# Patient Record
Sex: Female | Born: 1975 | Race: White | Hispanic: No | Marital: Single | Smoking: Current some day smoker
Health system: Southern US, Community
[De-identification: ages and names within clinical notes are randomized; demographics above are authoritative.]

## PROBLEM LIST (undated history)

## (undated) DIAGNOSIS — E785 Hyperlipidemia, unspecified: Secondary | ICD-10-CM

## (undated) DIAGNOSIS — F419 Anxiety disorder, unspecified: Secondary | ICD-10-CM

## (undated) DIAGNOSIS — A6000 Herpesviral infection of urogenital system, unspecified: Secondary | ICD-10-CM

## (undated) DIAGNOSIS — R945 Abnormal results of liver function studies: Secondary | ICD-10-CM

## (undated) DIAGNOSIS — J309 Allergic rhinitis, unspecified: Secondary | ICD-10-CM

## (undated) DIAGNOSIS — F32A Depression, unspecified: Secondary | ICD-10-CM

## (undated) DIAGNOSIS — K219 Gastro-esophageal reflux disease without esophagitis: Secondary | ICD-10-CM

## (undated) DIAGNOSIS — F329 Major depressive disorder, single episode, unspecified: Secondary | ICD-10-CM

## (undated) DIAGNOSIS — E559 Vitamin D deficiency, unspecified: Secondary | ICD-10-CM

## (undated) DIAGNOSIS — R7989 Other specified abnormal findings of blood chemistry: Secondary | ICD-10-CM

## (undated) DIAGNOSIS — E119 Type 2 diabetes mellitus without complications: Secondary | ICD-10-CM

## (undated) HISTORY — DX: Abnormal results of liver function studies: R94.5

## (undated) HISTORY — PX: APPENDECTOMY: SHX54

## (undated) HISTORY — DX: Herpesviral infection of urogenital system, unspecified: A60.00

## (undated) HISTORY — DX: Allergic rhinitis, unspecified: J30.9

## (undated) HISTORY — DX: Depression, unspecified: F32.A

## (undated) HISTORY — DX: Hyperlipidemia, unspecified: E78.5

## (undated) HISTORY — DX: Major depressive disorder, single episode, unspecified: F32.9

## (undated) HISTORY — DX: Anxiety disorder, unspecified: F41.9

## (undated) HISTORY — DX: Type 2 diabetes mellitus without complications: E11.9

## (undated) HISTORY — DX: Vitamin D deficiency, unspecified: E55.9

## (undated) HISTORY — PX: COLONOSCOPY: SHX174

## (undated) HISTORY — DX: Other specified abnormal findings of blood chemistry: R79.89

## (undated) HISTORY — PX: TUBAL LIGATION: SHX77

---

## 2005-06-13 ENCOUNTER — Ambulatory Visit (HOSPITAL_COMMUNITY): Admission: RE | Admit: 2005-06-13 | Discharge: 2005-06-13 | Payer: Self-pay | Admitting: Gynecology

## 2005-08-21 ENCOUNTER — Ambulatory Visit: Payer: Self-pay | Admitting: Family Medicine

## 2005-08-23 ENCOUNTER — Ambulatory Visit (HOSPITAL_COMMUNITY): Admission: RE | Admit: 2005-08-23 | Discharge: 2005-08-23 | Payer: Self-pay | Admitting: Family Medicine

## 2005-08-28 ENCOUNTER — Ambulatory Visit: Payer: Self-pay | Admitting: Family Medicine

## 2005-09-04 ENCOUNTER — Ambulatory Visit: Payer: Self-pay | Admitting: Family Medicine

## 2005-09-18 ENCOUNTER — Ambulatory Visit: Payer: Self-pay | Admitting: Family Medicine

## 2005-09-25 ENCOUNTER — Ambulatory Visit: Payer: Self-pay | Admitting: Family Medicine

## 2005-09-28 ENCOUNTER — Ambulatory Visit: Payer: Self-pay | Admitting: *Deleted

## 2005-10-02 ENCOUNTER — Ambulatory Visit: Payer: Self-pay | Admitting: Gynecology

## 2005-10-05 ENCOUNTER — Ambulatory Visit: Payer: Self-pay | Admitting: *Deleted

## 2005-10-09 ENCOUNTER — Ambulatory Visit: Payer: Self-pay | Admitting: Family Medicine

## 2005-10-12 ENCOUNTER — Ambulatory Visit: Payer: Self-pay | Admitting: Obstetrics & Gynecology

## 2005-10-16 ENCOUNTER — Ambulatory Visit: Payer: Self-pay | Admitting: Family Medicine

## 2005-10-19 ENCOUNTER — Ambulatory Visit: Payer: Self-pay | Admitting: *Deleted

## 2005-10-23 ENCOUNTER — Ambulatory Visit: Payer: Self-pay | Admitting: Family Medicine

## 2005-10-26 ENCOUNTER — Ambulatory Visit: Payer: Self-pay | Admitting: *Deleted

## 2005-10-30 ENCOUNTER — Ambulatory Visit: Payer: Self-pay | Admitting: Family Medicine

## 2005-10-31 ENCOUNTER — Inpatient Hospital Stay (HOSPITAL_COMMUNITY): Admission: AD | Admit: 2005-10-31 | Discharge: 2005-11-03 | Payer: Self-pay | Admitting: Gynecology

## 2005-10-31 ENCOUNTER — Ambulatory Visit: Payer: Self-pay | Admitting: Gynecology

## 2005-11-05 ENCOUNTER — Ambulatory Visit: Payer: Self-pay | Admitting: Gynecology

## 2005-11-07 ENCOUNTER — Ambulatory Visit: Payer: Self-pay | Admitting: Gynecology

## 2005-11-13 ENCOUNTER — Ambulatory Visit: Payer: Self-pay | Admitting: Obstetrics & Gynecology

## 2005-11-13 ENCOUNTER — Ambulatory Visit: Payer: Self-pay | Admitting: Family Medicine

## 2005-11-20 ENCOUNTER — Ambulatory Visit: Payer: Self-pay | Admitting: Obstetrics & Gynecology

## 2005-11-23 ENCOUNTER — Inpatient Hospital Stay (HOSPITAL_COMMUNITY): Admission: AD | Admit: 2005-11-23 | Discharge: 2005-11-23 | Payer: Self-pay | Admitting: Obstetrics & Gynecology

## 2005-11-27 ENCOUNTER — Ambulatory Visit: Payer: Self-pay | Admitting: Family Medicine

## 2005-11-29 ENCOUNTER — Ambulatory Visit: Payer: Self-pay | Admitting: Obstetrics & Gynecology

## 2005-12-04 ENCOUNTER — Ambulatory Visit: Payer: Self-pay | Admitting: Family Medicine

## 2005-12-25 ENCOUNTER — Ambulatory Visit: Payer: Self-pay | Admitting: Family Medicine

## 2010-07-10 ENCOUNTER — Ambulatory Visit: Payer: Self-pay | Admitting: Otolaryngology

## 2010-07-27 LAB — HM PAP SMEAR: HM Pap smear: NORMAL

## 2011-07-02 ENCOUNTER — Ambulatory Visit: Payer: Self-pay | Admitting: Family Medicine

## 2011-09-18 ENCOUNTER — Ambulatory Visit: Payer: Self-pay | Admitting: Surgery

## 2011-09-18 LAB — CBC WITH DIFFERENTIAL/PLATELET
Basophil #: 0.1 10*3/uL (ref 0.0–0.1)
Basophil %: 2.5 %
Eosinophil #: 0.1 10*3/uL (ref 0.0–0.7)
Eosinophil %: 1.5 %
HCT: 41.6 % (ref 35.0–47.0)
HGB: 13.4 g/dL (ref 12.0–16.0)
Lymphocyte #: 1.6 10*3/uL (ref 1.0–3.6)
Lymphocyte %: 32.7 %
MCH: 30.3 pg (ref 26.0–34.0)
MCHC: 32.3 g/dL (ref 32.0–36.0)
MCV: 94 fL (ref 80–100)
Monocyte #: 0.3 10*3/uL (ref 0.0–0.7)
Monocyte %: 6.3 %
Neutrophil #: 2.8 10*3/uL (ref 1.4–6.5)
Neutrophil %: 57 %
Platelet: 234 10*3/uL (ref 150–440)
RBC: 4.44 10*6/uL (ref 3.80–5.20)
RDW: 12.6 % (ref 11.5–14.5)
WBC: 4.9 10*3/uL (ref 3.6–11.0)

## 2011-09-18 LAB — BASIC METABOLIC PANEL
Anion Gap: 9 (ref 7–16)
BUN: 12 mg/dL (ref 7–18)
Calcium, Total: 8.9 mg/dL (ref 8.5–10.1)
Chloride: 105 mmol/L (ref 98–107)
Co2: 27 mmol/L (ref 21–32)
Creatinine: 0.79 mg/dL (ref 0.60–1.30)
EGFR (African American): >60
EGFR (Non-African Amer.): >60
Glucose: 115 mg/dL — ABNORMAL HIGH (ref 65–99)
Osmolality: 282 (ref 275–301)
Potassium: 3.8 mmol/L (ref 3.5–5.1)
Sodium: 141 mmol/L (ref 136–145)

## 2011-09-25 ENCOUNTER — Inpatient Hospital Stay: Payer: Self-pay | Admitting: Surgery

## 2011-09-25 HISTORY — PX: HERNIA REPAIR: SHX51

## 2011-09-25 LAB — CREATININE, SERUM
Creatinine: 0.78 mg/dL (ref 0.60–1.30)
EGFR (African American): >60
EGFR (Non-African Amer.): >60

## 2011-09-25 LAB — PREGNANCY, URINE: Pregnancy Test, Urine: NEGATIVE m[IU]/mL

## 2011-09-26 LAB — CBC WITH DIFFERENTIAL/PLATELET
Basophil #: 0 10*3/uL (ref 0.0–0.1)
Basophil %: 0.4 %
Eosinophil #: 0.1 10*3/uL (ref 0.0–0.7)
Eosinophil %: 0.9 %
HCT: 39.4 % (ref 35.0–47.0)
HGB: 13.1 g/dL (ref 12.0–16.0)
Lymphocyte #: 0.8 10*3/uL — ABNORMAL LOW (ref 1.0–3.6)
Lymphocyte %: 11.8 %
MCH: 31.4 pg (ref 26.0–34.0)
MCHC: 33.1 g/dL (ref 32.0–36.0)
MCV: 95 fL (ref 80–100)
Monocyte #: 0.5 x10 3/mm (ref 0.2–0.9)
Monocyte %: 7.8 %
Neutrophil #: 5.4 10*3/uL (ref 1.4–6.5)
Neutrophil %: 79.1 %
Platelet: 174 10*3/uL (ref 150–440)
RBC: 4.16 10*6/uL (ref 3.80–5.20)
RDW: 13.2 % (ref 11.5–14.5)
WBC: 6.9 10*3/uL (ref 3.6–11.0)

## 2011-09-26 LAB — BASIC METABOLIC PANEL
Anion Gap: 9 (ref 7–16)
BUN: 6 mg/dL — ABNORMAL LOW (ref 7–18)
Calcium, Total: 8.1 mg/dL — ABNORMAL LOW (ref 8.5–10.1)
Chloride: 107 mmol/L (ref 98–107)
Co2: 23 mmol/L (ref 21–32)
Creatinine: 0.8 mg/dL (ref 0.60–1.30)
EGFR (African American): >60
EGFR (Non-African Amer.): >60
Glucose: 172 mg/dL — ABNORMAL HIGH (ref 65–99)
Osmolality: 279 (ref 275–301)
Potassium: 4 mmol/L (ref 3.5–5.1)
Sodium: 139 mmol/L (ref 136–145)

## 2011-09-27 LAB — PATHOLOGY REPORT

## 2011-11-13 ENCOUNTER — Emergency Department: Payer: Self-pay | Admitting: Emergency Medicine

## 2011-11-13 LAB — LIPASE, BLOOD: Lipase: 181 U/L (ref 73–393)

## 2011-11-13 LAB — URINALYSIS, COMPLETE
Bilirubin,UR: NEGATIVE
Blood: NEGATIVE
Glucose,UR: NEGATIVE mg/dL (ref 0–75)
Ketone: NEGATIVE
Leukocyte Esterase: NEGATIVE
Nitrite: NEGATIVE
Ph: 5 (ref 4.5–8.0)
Protein: NEGATIVE
RBC,UR: NONE SEEN /HPF (ref 0–5)
Specific Gravity: 1.024 (ref 1.003–1.030)
Squamous Epithelial: 8
WBC UR: 1 /HPF (ref 0–5)

## 2011-11-13 LAB — CBC
HCT: 40.2 % (ref 35.0–47.0)
HGB: 13.3 g/dL (ref 12.0–16.0)
MCH: 30.2 pg (ref 26.0–34.0)
MCHC: 32.9 g/dL (ref 32.0–36.0)
MCV: 92 fL (ref 80–100)
Platelet: 236 10*3/uL (ref 150–440)
RBC: 4.39 10*6/uL (ref 3.80–5.20)
RDW: 13.4 % (ref 11.5–14.5)
WBC: 6.2 10*3/uL (ref 3.6–11.0)

## 2011-11-13 LAB — COMPREHENSIVE METABOLIC PANEL
Albumin: 4.1 g/dL (ref 3.4–5.0)
Alkaline Phosphatase: 72 U/L (ref 50–136)
Anion Gap: 8 (ref 7–16)
BUN: 13 mg/dL (ref 7–18)
Bilirubin,Total: 0.3 mg/dL (ref 0.2–1.0)
Calcium, Total: 9.1 mg/dL (ref 8.5–10.1)
Chloride: 104 mmol/L (ref 98–107)
Co2: 28 mmol/L (ref 21–32)
Creatinine: 0.83 mg/dL (ref 0.60–1.30)
EGFR (African American): >60
EGFR (Non-African Amer.): >60
Glucose: 112 mg/dL — ABNORMAL HIGH (ref 65–99)
Osmolality: 280 (ref 275–301)
Potassium: 4.1 mmol/L (ref 3.5–5.1)
SGOT(AST): 41 U/L — ABNORMAL HIGH (ref 15–37)
SGPT (ALT): 58 U/L
Sodium: 140 mmol/L (ref 136–145)
Total Protein: 7.9 g/dL (ref 6.4–8.2)

## 2011-11-13 LAB — PREGNANCY, URINE: Pregnancy Test, Urine: NEGATIVE m[IU]/mL

## 2012-03-25 ENCOUNTER — Ambulatory Visit: Payer: Self-pay | Admitting: Internal Medicine

## 2014-02-04 LAB — CBC AND DIFFERENTIAL
HCT: 40 % (ref 36–46)
Hemoglobin: 14 g/dL (ref 12.0–16.0)
Neutrophils Absolute: 4 /uL
Platelets: 238 10*3/uL (ref 150–399)
WBC: 7.1 10^3/mL

## 2014-03-08 ENCOUNTER — Ambulatory Visit: Payer: Self-pay | Admitting: Gastroenterology

## 2014-05-25 ENCOUNTER — Emergency Department: Payer: Self-pay | Admitting: Emergency Medicine

## 2014-05-25 LAB — COMPREHENSIVE METABOLIC PANEL
Albumin: 3.8 g/dL (ref 3.4–5.0)
Alkaline Phosphatase: 68 U/L
Anion Gap: 6 — ABNORMAL LOW (ref 7–16)
BUN: 11 mg/dL (ref 7–18)
Bilirubin,Total: 0.3 mg/dL (ref 0.2–1.0)
Calcium, Total: 8.6 mg/dL (ref 8.5–10.1)
Chloride: 107 mmol/L (ref 98–107)
Co2: 25 mmol/L (ref 21–32)
Creatinine: 0.7 mg/dL (ref 0.60–1.30)
EGFR (African American): >60
EGFR (Non-African Amer.): >60
Glucose: 106 mg/dL — ABNORMAL HIGH (ref 65–99)
Osmolality: 275 (ref 275–301)
Potassium: 4.1 mmol/L (ref 3.5–5.1)
SGOT(AST): 28 U/L (ref 15–37)
SGPT (ALT): 40 U/L
Sodium: 138 mmol/L (ref 136–145)
Total Protein: 7.2 g/dL (ref 6.4–8.2)

## 2014-05-25 LAB — TROPONIN I
Troponin-I: <0.02 ng/mL
Troponin-I: <0.02 ng/mL

## 2014-05-25 LAB — CBC
HCT: 42.7 % (ref 35.0–47.0)
HGB: 13.9 g/dL (ref 12.0–16.0)
MCH: 31.2 pg (ref 26.0–34.0)
MCHC: 32.5 g/dL (ref 32.0–36.0)
MCV: 96 fL (ref 80–100)
Platelet: 197 10*3/uL (ref 150–440)
RBC: 4.46 10*6/uL (ref 3.80–5.20)
RDW: 13.5 % (ref 11.5–14.5)
WBC: 6.7 10*3/uL (ref 3.6–11.0)

## 2014-05-25 LAB — URINALYSIS, COMPLETE
Bacteria: NONE SEEN
Bilirubin,UR: NEGATIVE
Blood: NEGATIVE
Glucose,UR: NEGATIVE mg/dL (ref 0–75)
Ketone: NEGATIVE
Leukocyte Esterase: NEGATIVE
Nitrite: NEGATIVE
Ph: 6 (ref 4.5–8.0)
Protein: NEGATIVE
RBC,UR: <1 /HPF (ref 0–5)
Specific Gravity: 1.018 (ref 1.003–1.030)
Squamous Epithelial: 2
WBC UR: <1 /HPF (ref 0–5)

## 2014-05-25 LAB — CK TOTAL AND CKMB (NOT AT ARMC)
CK, Total: 91 U/L (ref 26–192)
CK-MB: <0.5 ng/mL — ABNORMAL LOW (ref 0.5–3.6)

## 2014-05-26 ENCOUNTER — Ambulatory Visit (INDEPENDENT_AMBULATORY_CARE_PROVIDER_SITE_OTHER): Payer: Managed Care, Other (non HMO) | Admitting: Cardiovascular Disease

## 2014-05-26 ENCOUNTER — Encounter: Payer: Self-pay | Admitting: Cardiovascular Disease

## 2014-05-26 ENCOUNTER — Ambulatory Visit: Payer: Self-pay | Admitting: Family Medicine

## 2014-05-26 VITALS — BP 110/68 | HR 85 | Ht 62.0 in | Wt 215.5 lb

## 2014-05-26 DIAGNOSIS — E782 Mixed hyperlipidemia: Secondary | ICD-10-CM | POA: Insufficient documentation

## 2014-05-26 DIAGNOSIS — E785 Hyperlipidemia, unspecified: Secondary | ICD-10-CM | POA: Insufficient documentation

## 2014-05-26 DIAGNOSIS — E1165 Type 2 diabetes mellitus with hyperglycemia: Secondary | ICD-10-CM

## 2014-05-26 DIAGNOSIS — E669 Obesity, unspecified: Secondary | ICD-10-CM

## 2014-05-26 DIAGNOSIS — F17200 Nicotine dependence, unspecified, uncomplicated: Secondary | ICD-10-CM | POA: Insufficient documentation

## 2014-05-26 DIAGNOSIS — M79602 Pain in left arm: Secondary | ICD-10-CM

## 2014-05-26 DIAGNOSIS — E1169 Type 2 diabetes mellitus with other specified complication: Secondary | ICD-10-CM | POA: Insufficient documentation

## 2014-05-26 DIAGNOSIS — E119 Type 2 diabetes mellitus without complications: Secondary | ICD-10-CM

## 2014-05-26 DIAGNOSIS — IMO0001 Reserved for inherently not codable concepts without codable children: Secondary | ICD-10-CM | POA: Insufficient documentation

## 2014-05-26 DIAGNOSIS — R079 Chest pain, unspecified: Secondary | ICD-10-CM | POA: Insufficient documentation

## 2014-05-26 DIAGNOSIS — Z720 Tobacco use: Secondary | ICD-10-CM

## 2014-05-26 HISTORY — DX: Nicotine dependence, unspecified, uncomplicated: F17.200

## 2014-05-26 LAB — HM MAMMOGRAPHY: HM Mammogram: NORMAL

## 2014-05-26 NOTE — Assessment & Plan Note (Signed)
Atypical symptoms. Unable to exclude musculoskeletal. Less likely ischemic Again we have suggested she has additional symptoms that she call our office for further workup including possible stress test

## 2014-05-26 NOTE — Assessment & Plan Note (Signed)
Atypical symptoms with very sharp squeezing in the chest associated with rest. Also atypical symptoms in her left arm. We had a long discussion about her various treatment options. We offered routine treadmill, also stress echo. After further discussion, We have suggested that she call our office if she has additional episodes of chest pain.

## 2014-05-26 NOTE — Patient Instructions (Signed)
You are doing well. No medication changes were made.  Please call the office if you are having more chest or arm pain We would order a stress test  Please call us if you have new issues that need to be addressed before your next appt.

## 2014-05-26 NOTE — Assessment & Plan Note (Signed)
Recommended that she stay on her simvastatin

## 2014-05-26 NOTE — Assessment & Plan Note (Signed)
We have encouraged continued exercise, careful diet management in an effort to lose weight. 

## 2014-05-26 NOTE — Assessment & Plan Note (Signed)
We have encouraged her to continue to work on weaning her cigarettes and smoking cessation. She will continue to work on this and does not want any assistance with chantix.  

## 2014-05-26 NOTE — Progress Notes (Signed)
Patient ID: Lauren [name], female    DOB: August 15, 1975, 38 y.o.   MRN: [id number]  HPI Comments: [name] is a 38 year old woman with 25 year smoking history, diabetes for past year, on a cholesterol medication for 6 months who presents for evaluation of chest pain and arm pain. She reports that for the past several days she has had left arm pain, chest squeezing.  Symptoms started 2 days ago on a Monday. At 10 AM in the morning she had sharp shooting left arm pain lasting several seconds. She had 5-6 episodes through the course of the day while she was at work. Symptoms resolved without intervention. The next day on Tuesday she had left side chest squeezing pain starting at 8 in the morning. Symptoms occurred while she was walking, lasted for a second or 2, described as sharp in nature She went to the emergency room yesterday 04/25/2014 Workup in the emergency room was essentially negative. This was reviewed with her. Cardiac enzymes negative, EKG normal, CBC and basic metabolic panel and LFTs normal. Chest x-ray normal No further symptoms since that time. No prior symptoms. No other associated symptoms such as diaphoresis, shortness of breath. She is otherwise active, works a busy job, works a Forensic scientist  She started smoking when she was age 38, started on diabetes medications one year ago She reports that her sugars are recently well controlled.   EKG on today's visit shows normal sinus rhythm with rate 85 bpm, no significant ST or T-wave changes   No Known Allergies  Outpatient Encounter Prescriptions as of 05/26/2014  Medication Sig  . ALPRAZolam (XANAX) 1 MG tablet Take 1 mg by mouth 3 (three) times daily as needed for anxiety.  . citalopram (CELEXA) 20 MG tablet Take 20 mg by mouth daily.  . metFORMIN (GLUCOPHAGE) 500 MG tablet Take 500 mg by mouth 2 (two) times daily with a meal.  . simvastatin (ZOCOR) 20 MG tablet Take 20 mg by mouth daily.  . valACYclovir (VALTREX) 1000 MG tablet  Take 1,000 mg by mouth daily.    Past Medical History  Diagnosis Date  . Diabetes mellitus without complication   . Hyperlipidemia   . Anxiety     Past Surgical History  Procedure Laterality Date  . Hernia repair    . Colonoscopy    . Cesarean section      x 4     Social History  reports that she has been smoking Cigarettes.  She has a 12.5 pack-year smoking history. She does not have any smokeless tobacco history on file. She reports that she does not drink alcohol or use illicit drugs.  Family History family history includes Arrhythmia (age of onset: 73) in her father; Heart disease (age of onset: 58) in her brother; Hyperlipidemia in her mother; Hypertension in her mother.   Review of Systems  Constitutional: Negative.   HENT: Negative.   Respiratory: Positive for chest tightness.   Cardiovascular: Positive for chest pain.       Left arm pain  Gastrointestinal: Negative.   Musculoskeletal: Negative.   Neurological: Negative.   Hematological: Negative.   Psychiatric/Behavioral: Negative.   All other systems reviewed and are negative.   BP 110/68 mmHg  Pulse 85  Ht 5\' 2"  (1.575 m)  Wt 215 lb 8 oz (97.75 kg)  BMI 39.41 kg/m2  Physical Exam  Constitutional: She is oriented to person, place, and time. She appears well-developed and well-nourished.  HENT:  Head: Normocephalic.  Nose: Nose  normal.  Mouth/Throat: Oropharynx is clear and moist.  Eyes: Conjunctivae are normal. Pupils are equal, round, and reactive to light.  Neck: Normal range of motion. Neck supple. No JVD present.  Cardiovascular: Normal rate, regular rhythm, S1 normal, S2 normal, normal heart sounds and intact distal pulses.  Exam reveals no gallop and no friction rub.   No murmur heard. Pulmonary/Chest: Effort normal and breath sounds normal. No respiratory distress. She has no wheezes. She has no rales. She exhibits no tenderness.  Abdominal: Soft. Bowel sounds are normal. She exhibits no  distension. There is no tenderness.  Musculoskeletal: Normal range of motion. She exhibits no edema or tenderness.  Lymphadenopathy:    She has no cervical adenopathy.  Neurological: She is alert and oriented to person, place, and time. Coordination normal.  Skin: Skin is warm and dry. No rash noted. No erythema.  Psychiatric: She has a normal mood and affect. Her behavior is normal. Judgment and thought content normal.    Assessment and Plan  Nursing note and vitals reviewed.

## 2014-05-26 NOTE — Assessment & Plan Note (Signed)
Recommended diet modification, exercise

## 2014-07-01 LAB — BASIC METABOLIC PANEL WITH GFR
BUN: 13 mg/dL (ref 4–21)
Creatinine: 0.7 mg/dL (ref 0.5–1.1)
Glucose: 129 mg/dL
Potassium: 4.4 mmol/L (ref 3.4–5.3)
Sodium: 143 mmol/L (ref 137–147)

## 2014-07-01 LAB — HEPATIC FUNCTION PANEL
ALT: 37 U/L — AB (ref 7–35)
AST: 27 U/L (ref 13–35)
Alkaline Phosphatase: 60 U/L (ref 25–125)
Bilirubin, Total: 0.4 mg/dL

## 2014-09-17 ENCOUNTER — Telehealth: Payer: Self-pay

## 2014-09-17 DIAGNOSIS — R079 Chest pain, unspecified: Secondary | ICD-10-CM

## 2014-09-17 LAB — LIPID PANEL
Cholesterol: 148 mg/dL (ref 0–200)
HDL: 30 mg/dL — AB (ref 35–70)
LDL Cholesterol: 95 mg/dL
Triglycerides: 113 mg/dL (ref 40–160)

## 2014-09-17 LAB — HEMOGLOBIN A1C: Hgb A1c MFr Bld: 8.1 % — AB (ref 4.0–6.0)

## 2014-09-17 NOTE — Telephone Encounter (Signed)
Pt would like to make an appt for a stress test. Please call.

## 2014-09-17 NOTE — Telephone Encounter (Signed)
Would consider plain treadmill or treadmill echo stress test in the office for chest pain, arm pain

## 2014-09-17 NOTE — Telephone Encounter (Signed)
Can we sched her for a treadmill in the office?

## 2014-09-29 ENCOUNTER — Ambulatory Visit (INDEPENDENT_AMBULATORY_CARE_PROVIDER_SITE_OTHER): Payer: Managed Care, Other (non HMO) | Admitting: Cardiovascular Disease

## 2014-09-29 ENCOUNTER — Encounter: Payer: Managed Care, Other (non HMO) | Admitting: Cardiology

## 2014-09-29 ENCOUNTER — Encounter: Payer: Self-pay | Admitting: Cardiovascular Disease

## 2014-09-29 DIAGNOSIS — R079 Chest pain, unspecified: Secondary | ICD-10-CM | POA: Diagnosis not present

## 2014-09-29 NOTE — Patient Instructions (Signed)
Stress test showed no EKG changes concerning for ischemia Recommend regular walking program for conditioning Should help hypertension seen with exertion

## 2014-09-29 NOTE — Procedures (Signed)
Exercise Treadmill Test Treadmill ordered for recent epsiodes of chest pain.  Resting EKG shows NSR with rate of 93 bpm, no significant ST or T-wave changes Resting blood pressure of 132/82 Stand bruce protocal was used.  Patient exercised for 8 min 00 sec,  Peak heart rate of 164 bpm.  This was 90% of the maximum predicted heart rate (181). Achieved 10.1 METS No symptoms of chest pain or lightheadedness were reported at peak stress or in recovery.  Peak Blood pressure recorded was 214/65. Heart rate at 3 minutes in recovery was 97 bpm No ST changes concerning for ischemia  FINAL IMPRESSION: Normal exercise stress test. No significant EKG changes concerning for ischemia. Could exercise tolerance. Would recommend a regular walking program for improved conditioning.  This will likely help hypertension seen with exertion

## 2014-10-10 NOTE — Op Note (Signed)
PATIENT NAME:  Lauren [name], Lauren [name] MR#:  [id number] DATE OF BIRTH:  09/28/1975  DATE OF PROCEDURE:  09/25/2011  PREOPERATIVE DIAGNOSIS: Complex incisional ventral hernia.   POSTOPERATIVE DIAGNOSIS: Complex incisional ventral hernia.  PROCEDURE PERFORMED:   1. Laparotomy with extensive lysis of adhesions.  2. Incidental appendectomy.   SURGEON: Sherri Rad, MD   CO-SURGEON: Nicholaus Bloom, MD   TYPE OF ANESTHESIA: General endotracheal.   FINDINGS: Large hernia and normal appendix.   SPECIMENS: Hernia sac and appendix.   DESCRIPTION OF PROCEDURE: With the patient in the supine position and general endotracheal anesthesia being induced, the patient's abdomen was widely prepped and draped in the usual sterile fashion. The previous lower midline excision was excised sharply. The abdomen was entered sharply with a scalpel. The hernia sac was then dissected free with the assistance of Dr. Tula Nakayama down to the fascia. The adhesions were taken down off the anterior abdominal wall with sharp dissection, and no enterotomies were noted. The appendix was readily available. The mesoappendix was divided between clamps and ties of 0 Vicryl. The base of the appendix was transected with a single fire of the 55 mm GIA stapler with blue load application. The staple line was imbricated with 3-0 silk suture.       At this point, the remaining muscle advancement flap on the left side and placement of a Seldinger dermal matrix measuring 10 x 14 cm was accomplished by Dr. Tula Nakayama with my assistance directly.  ____________________________ Jeannette How. Marina Gravel, MD mab:cbb D: 09/26/2011 13:50:24 ET T: 09/26/2011 15:19:25 ET JOB#: [id number]  cc: Elta Guadeloupe A. Marina Gravel, MD, <Dictator> Cleda Daub, MD Doylene Bode, MD Nyoka Alcoser Bettina Gavia MD ELECTRONICALLY SIGNED 10/01/2011 7:18

## 2014-10-10 NOTE — Op Note (Signed)
PATIENT NAME:  Lauren [name], Lauren [name] MR#:  [id number] DATE OF BIRTH:  02-Dec-1975  DATE OF PROCEDURE:  09/25/2011  PREOPERATIVE DIAGNOSIS: Recurrent incisional hernia.   POSTOPERATIVE DIAGNOSIS: Recurrent incisional hernia.   PROCEDURES:  1. Open hernia repair.  2. Lysis of adhesions.  3. Placement of acellular dermal matrix (10 cm x 16 cm).  4. Left muscle advancement component separation procedure.   STAFF SURGEON: Cleda Daub, MD   CO-SURGEONSherri Rad, MD    ANESTHESIA: General endotracheal.   ESTIMATED BLOOD LOSS: 25 mL.   COMPLICATIONS: None.   FINDINGS: Tension-free closure at completion of procedure.   DISPOSITION: Stable to recovery.   STATEMENT OF NECESSITY: Lauren [name] is a 39 year old obese female who has a rapidly growing large lower abdominal hernia after Cesarean section. She is also previously a smoker. She has stopped smoking and recovered from a previous MRSA infection and presents for elective hernia repair. The risks, benefits, and alternatives including, but not limited to, bowel injury, contour irregularity, wound complications, and recurrent hernia were discussed with her and she has requested the procedure as described.   STATEMENT OF PROCEDURE: The patient was brought to the operating room awake, alert, and comfortable and placed on the OR table in the supine position with both arms outstretched. After adequate anesthesia was achieved, a Foley catheter was inserted, SCDs were on and functioning, bony prominences were well padded, and the patient's abdomen was prepped and draped in the usual sterile fashion.   The previous lower abdominal incision was excised sharply. Carrying the incision down through the skin and subcutaneous fat in the scar plane, the hernia sac was identified. Lauren [name] and I worked together to dissect the hernia sac and reduce the hernia contents. The sac was opened and the lysis of adhesions portion of the procedure was performed. This will be  dictated by Lauren [name]. He also performed an incidental appendectomy which will be dictated by him as well. At the completion of the lysis of adhesions and the appendectomy, the subcutaneous plane along the anterior abdominal wall was dissected under direct visualization with minimal bleeding out to the external oblique on both sides. The midline did not come together and it was determined that a unilateral component separation would be performed. The external oblique fascia was identified and incised with Bovie electrocautery and then using careful dissection the plane underneath the external oblique was dissected up to approximately the level of 3 cm above the umbilicus and down towards the pubic tubercle. This accomplished approximately a 6 cm medial mobilization of the left side of her abdominal wall. The next portion of the procedure was the inlay of the acellular dermal matrix. Strattice 10 cm x 16 cm mesh was utilized, Lot #[id number], reference # [id number]. This was hydrated and then placed inside the abdomen over top of a bowel protector. Using #1 PDS sutures, first the pubic and then the xiphoid were secured in place. Then under a moderate amount of tension through-and-through stitches through the abdominal wall and then through the dermal matrix were performed being careful to prevent any bowel from being included within the repair. On the left side a similar procedure was performed, however, these were tagged and then tied all at once after removing the abdominal bowel protector and completing the laparotomy count. No bowel was included in the closure. A completely exclusive hernia repair was performed and this brought the midline tissues into tension-free opposition. 0 PDS was then used to suture the external fascia  over the rectus and then the wound was irrigated. Hemostasis was confirmed Scarpa's layer was closed with a running 2-0 Vicryl followed by deep dermal 3-0 Vicryl. Prior to the dermal closure,  the excess hernia sac and attenuated skin in the left lower quadrant was resected. Approximately a 20 cm x 10 cm segment was resected. Hemostasis was confirmed. 3-0 Vicryl followed by staples was used to complete the closure. Needle, sponge, and lap counts were correct. Two 87 French Blake drains were brought out through stab incisions in the dead space underneath the skin for postoperative fluid management. Needle, sponge, and lap counts were confirmed and the patient was placed in a sterile dressing and a compressive garment. She tolerated the procedure well and was transferred to recovery.  ____________________________ Cleda Daub, MD bsc:drc D: 09/25/2011 15:07:19 ET T: 09/25/2011 15:35:14 ET JOB#: [id number] Cleda Daub MD ELECTRONICALLY SIGNED 10/15/2011 18:18

## 2014-10-10 NOTE — Discharge Summary (Signed)
PATIENT NAME:  Lauren [name], Lauren [name] MR#:  [id number] DATE OF BIRTH:  [birth year]  DATE OF ADMISSION:  09/25/2011 DATE OF DISCHARGE:  09/29/2011  FINAL DIAGNOSIS Complex ventral hernia.   PROCEDURE PERFORMED: Repair of complex ventral hernia with muscular flap and stratus implantation on 09/25/2011.   HOSPITAL COURSE SUMMARY: The patient was taken to the Operating Room on 04/09 complex hernia repair was performed. Postoperatively she was doing well. On postoperative day #2 patient passed some flatus and had a bowel movement. JPs were 90 and 90 and appeared to be serosanguineous. On postoperative day #3 patient was getting out of bed, dressing changes were performed and the wound was found to be healing nicely. On postoperative day #4 the patient was seen to be doing very well, was discharged home in stable condition with the abdominal binder in place for six weeks.   DISCHARGE MEDICATION: Percocet 1 to 2 tabs by mouth every 4 to 6 hours as needed for pain.   DISCHARGE INSTRUCTIONS: Call the office in the morning for appointment in one week's time for staple removal.   ____________________________ Jeannette How. Marina Gravel, MD mab:cms D: 10/09/2011 23:19:47 ET T: 10/10/2011 09:01:20 ET JOB#: [id number]  cc: Elta Guadeloupe A. Marina Gravel, MD, <Dictator> Doylene Bode, MD Cleda Daub, MD Louine Tenpenny Bettina Gavia MD ELECTRONICALLY SIGNED 10/10/2011 23:06

## 2014-12-13 ENCOUNTER — Encounter: Payer: Self-pay | Admitting: Family Medicine

## 2014-12-13 ENCOUNTER — Ambulatory Visit (INDEPENDENT_AMBULATORY_CARE_PROVIDER_SITE_OTHER): Payer: Managed Care, Other (non HMO) | Admitting: Family Medicine

## 2014-12-13 VITALS — BP 118/70 | HR 84 | Temp 98.5°F | Ht 62.0 in | Wt 211.8 lb

## 2014-12-13 DIAGNOSIS — Z8619 Personal history of other infectious and parasitic diseases: Secondary | ICD-10-CM | POA: Diagnosis not present

## 2014-12-13 DIAGNOSIS — E785 Hyperlipidemia, unspecified: Secondary | ICD-10-CM

## 2014-12-13 DIAGNOSIS — F418 Other specified anxiety disorders: Secondary | ICD-10-CM

## 2014-12-13 DIAGNOSIS — E119 Type 2 diabetes mellitus without complications: Secondary | ICD-10-CM

## 2014-12-13 DIAGNOSIS — F329 Major depressive disorder, single episode, unspecified: Secondary | ICD-10-CM | POA: Insufficient documentation

## 2014-12-13 DIAGNOSIS — E559 Vitamin D deficiency, unspecified: Secondary | ICD-10-CM | POA: Diagnosis not present

## 2014-12-13 DIAGNOSIS — E669 Obesity, unspecified: Secondary | ICD-10-CM | POA: Diagnosis not present

## 2014-12-13 DIAGNOSIS — F32A Depression, unspecified: Secondary | ICD-10-CM | POA: Insufficient documentation

## 2014-12-13 DIAGNOSIS — F419 Anxiety disorder, unspecified: Secondary | ICD-10-CM | POA: Insufficient documentation

## 2014-12-13 DIAGNOSIS — E1169 Type 2 diabetes mellitus with other specified complication: Secondary | ICD-10-CM

## 2014-12-13 MED ORDER — METFORMIN HCL 1000 MG PO TABS: 1000.0000 mg | ORAL_TABLET | Freq: Two times a day (BID) | ORAL | Status: DC

## 2014-12-13 MED ORDER — CITALOPRAM HYDROBROMIDE 40 MG PO TABS: 40.0000 mg | ORAL_TABLET | Freq: Every day | ORAL | Status: DC

## 2014-12-13 MED ORDER — ALPRAZOLAM 1 MG PO TABS: 1.0000 mg | ORAL_TABLET | Freq: Three times a day (TID) | ORAL | Status: DC | PRN

## 2014-12-13 MED ORDER — SIMVASTATIN 40 MG PO TABS: 40.0000 mg | ORAL_TABLET | Freq: Every day | ORAL | Status: DC

## 2014-12-13 MED ORDER — VALACYCLOVIR HCL 1 G PO TABS: 1000.0000 mg | ORAL_TABLET | Freq: Every day | ORAL | Status: DC

## 2014-12-13 NOTE — Progress Notes (Signed)
Name: Lauren [name]   MRN: [id number]    DOB: 08-13-1975   Date:12/13/2014       Progress Note  Subjective  Chief Complaint  Chief Complaint  Patient presents with  . Follow-up    3 month f/u    Diabetes She presents for her follow-up diabetic visit. She has type 2 diabetes mellitus. Her disease course has been stable. Hypoglycemia symptoms include nervousness/anxiousness. Pertinent negatives for diabetes include no fatigue, no foot paresthesias and no polyuria. Symptoms are stable. Her weight is stable. She is following a diabetic diet. She participates in exercise three times a week. Her breakfast blood glucose range is generally 110-130 mg/dl. An ACE inhibitor/angiotensin II receptor blocker is not being taken.  Hyperlipidemia This is a chronic problem. The problem is controlled. Recent lipid tests were reviewed and are normal. Pertinent negatives include no focal sensory loss, leg pain or myalgias. Current antihyperlipidemic treatment includes statins.  Anxiety Presents for follow-up visit. The problem has been unchanged. Symptoms include excessive worry, irritability, muscle tension and nervous/anxious behavior. Patient reports no depressed mood. Symptoms occur most days. The severity of symptoms is moderate. The symptoms are aggravated by family issues and work stress. The quality of sleep is fair.   Her past medical history is significant for depression. There is no history of bipolar disorder. Past treatments include benzodiazephines and SSRIs. The treatment provided significant relief. Compliance with prior treatments has been good.      Past Medical History  Diagnosis Date  . Diabetes mellitus without complication   . Hyperlipidemia   . Anxiety   . Genital herpes   . Chronic depression   . Vitamin D deficiency   . Allergic rhinitis   . Elevated LFTs     Past Surgical History  Procedure Laterality Date  . Hernia repair  09/25/2011  . Colonoscopy    . Cesarean section       x 4   . Tubal ligation      Family History  Problem Relation Age of Onset  . Hypertension Mother   . Hyperlipidemia Mother   . Arrhythmia Father 62    A-fib  . Heart disease Brother 43    stent x 1   . Alcoholism Brother   . Alcohol abuse Father   . Asthma Son   . Asperger's syndrome Son     History   Social History  . Marital Status: Single    Spouse Name: N/A  . Number of Children: 4  . Years of Education: N/A   Occupational History  .  [employer]   Social History Main Topics  . Smoking status: Current Every Day Smoker -- 0.50 packs/day for 25 years    Types: Cigarettes  . Smokeless tobacco: Not on file  . Alcohol Use: 0.0 oz/week    0 Standard drinks or equivalent per week     Comment: occasionally  . Drug Use: No  . Sexual Activity: Not on file   Other Topics Concern  . Not on file   Social History Narrative     Current outpatient prescriptions:  .  albuterol (PROVENTIL HFA) 108 (90 BASE) MCG/ACT inhaler, Inhale 2 puffs into the lungs every 6 (six) hours as needed., Disp: , Rfl:  .  ALPRAZolam (XANAX) 1 MG tablet, Take 1 mg by mouth 3 (three) times daily as needed for anxiety., Disp: , Rfl:  .  citalopram (CELEXA) 20 MG tablet, Take 20 mg by mouth daily., Disp: , Rfl:  .  metFORMIN (GLUCOPHAGE) 500 MG tablet, Take 1,000 mg by mouth 2 (two) times daily with a meal. , Disp: , Rfl:  .  simvastatin (ZOCOR) 20 MG tablet, Take 20 mg by mouth daily., Disp: , Rfl:  .  valACYclovir (VALTREX) 1000 MG tablet, Take 1,000 mg by mouth daily., Disp: , Rfl:  .  Vitamin D, Ergocalciferol, (DRISDOL) 50000 UNITS CAPS capsule, Take 1 capsule by mouth once a week., Disp: , Rfl:   No Known Allergies   Review of Systems  Constitutional: Positive for irritability. Negative for fatigue.  Musculoskeletal: Negative for myalgias.  Psychiatric/Behavioral: Positive for depression. The patient is nervous/anxious.       Objective  Filed Vitals:   12/13/14 0919  BP: 118/70   Pulse: 84  Temp: 98.5 F (36.9 C)  TempSrc: Oral  Height: 5\' 2"  (1.575 m)  Weight: 211 lb 12.8 oz (96.072 kg)  SpO2: 98%    Physical Exam  Constitutional: She is oriented to person, place, and time and well-developed, well-nourished, and in no distress.  HENT:  Head: Normocephalic and atraumatic.  Cardiovascular: Normal rate and regular rhythm.   Pulmonary/Chest: Effort normal and breath sounds normal.  Abdominal: Soft. Bowel sounds are normal.  Neurological: She is alert and oriented to person, place, and time.  Skin: Skin is warm and dry.  Psychiatric: Memory, affect and judgment normal.  Nursing note and vitals reviewed.         Assessment & Plan 1. Hyperlipidemia Last fasting lipid panel showed HDL below normal. Repeat fasting lipid panel today. - simvastatin (ZOCOR) 40 MG tablet; Take 1 tablet (40 mg total) by mouth daily at 6 PM.  Dispense: 90 tablet; Refill: 0 - Lipid panel - Comprehensive metabolic panel  2. Diabetes mellitus type 2 in obese  - metFORMIN (GLUCOPHAGE) 1000 MG tablet; Take 1 tablet (1,000 mg total) by mouth 2 (two) times daily with a meal.  Dispense: 180 tablet; Refill: 0 - HgB A1c  3. Vitamin D deficiency Patient has finished 12 weeks of vitamin D prescription. Repeat levels today. - Vitamin D (25 hydroxy)  4. Anxiety and depression  - ALPRAZolam (XANAX) 1 MG tablet; Take 1 tablet (1 mg total) by mouth 3 (three) times daily as needed for anxiety.  Dispense: 90 tablet; Refill: 2 - citalopram (CELEXA) 40 MG tablet; Take 1 tablet (40 mg total) by mouth daily.  Dispense: 90 tablet; Refill: 0  5. History of herpes genitalis Patient on suppression therapy. Continue present medication. - valACYclovir (VALTREX) 1000 MG tablet; Take 1 tablet (1,000 mg total) by mouth daily.  Dispense: 90 tablet; Refill: 3  There are no diagnoses linked to this encounter.  Dimitriy Carreras Asad A. [facility] 12/13/2014 9:36  AM

## 2014-12-14 LAB — COMPREHENSIVE METABOLIC PANEL
ALT: 25 IU/L (ref 0–32)
AST: 20 IU/L (ref 0–40)
Albumin/Globulin Ratio: 1.9 (ref 1.1–2.5)
Albumin: 4.1 g/dL (ref 3.5–5.5)
Alkaline Phosphatase: 67 IU/L (ref 39–117)
BUN/Creatinine Ratio: 15 (ref 8–20)
BUN: 9 mg/dL (ref 6–20)
Bilirubin Total: 0.3 mg/dL (ref 0.0–1.2)
CO2: 25 mmol/L (ref 18–29)
Calcium: 9.1 mg/dL (ref 8.7–10.2)
Chloride: 103 mmol/L (ref 97–108)
Creatinine, Ser: 0.61 mg/dL (ref 0.57–1.00)
GFR calc Af Amer: 132 mL/min/{1.73_m2} (ref >59)
GFR calc non Af Amer: 115 mL/min/{1.73_m2} (ref >59)
Globulin, Total: 2.2 g/dL (ref 1.5–4.5)
Glucose: 97 mg/dL (ref 65–99)
Potassium: 4.6 mmol/L (ref 3.5–5.2)
Sodium: 141 mmol/L (ref 134–144)
Total Protein: 6.3 g/dL (ref 6.0–8.5)

## 2014-12-14 LAB — LIPID PANEL
Chol/HDL Ratio: 6.1 ratio units — ABNORMAL HIGH (ref 0.0–4.4)
Cholesterol, Total: 184 mg/dL (ref 100–199)
HDL: 30 mg/dL — ABNORMAL LOW (ref >39)
LDL Calculated: 124 mg/dL — ABNORMAL HIGH (ref 0–99)
Triglycerides: 152 mg/dL — ABNORMAL HIGH (ref 0–149)
VLDL Cholesterol Cal: 30 mg/dL (ref 5–40)

## 2014-12-14 LAB — VITAMIN D 25 HYDROXY (VIT D DEFICIENCY, FRACTURES): Vit D, 25-Hydroxy: 35.1 ng/mL (ref 30.0–100.0)

## 2014-12-14 LAB — HEMOGLOBIN A1C
Est. average glucose Bld gHb Est-mCnc: 143 mg/dL
Hgb A1c MFr Bld: 6.6 % — ABNORMAL HIGH (ref 4.8–5.6)

## 2014-12-15 NOTE — Progress Notes (Signed)
Called pt, reviewed labs.  Asked Pt to call front desk to schedule appt to discuss changing cholesterol med change.

## 2015-03-15 ENCOUNTER — Ambulatory Visit (INDEPENDENT_AMBULATORY_CARE_PROVIDER_SITE_OTHER): Payer: Managed Care, Other (non HMO) | Admitting: Family Medicine

## 2015-03-15 ENCOUNTER — Encounter: Payer: Self-pay | Admitting: Family Medicine

## 2015-03-15 ENCOUNTER — Encounter (INDEPENDENT_AMBULATORY_CARE_PROVIDER_SITE_OTHER): Payer: Self-pay

## 2015-03-15 VITALS — BP 118/61 | HR 86 | Temp 98.6°F | Resp 18 | Ht 62.0 in | Wt 210.8 lb

## 2015-03-15 DIAGNOSIS — E119 Type 2 diabetes mellitus without complications: Secondary | ICD-10-CM | POA: Diagnosis not present

## 2015-03-15 DIAGNOSIS — E669 Obesity, unspecified: Secondary | ICD-10-CM | POA: Diagnosis not present

## 2015-03-15 DIAGNOSIS — E785 Hyperlipidemia, unspecified: Secondary | ICD-10-CM | POA: Diagnosis not present

## 2015-03-15 DIAGNOSIS — F32A Depression, unspecified: Secondary | ICD-10-CM

## 2015-03-15 DIAGNOSIS — F419 Anxiety disorder, unspecified: Secondary | ICD-10-CM

## 2015-03-15 DIAGNOSIS — F418 Other specified anxiety disorders: Secondary | ICD-10-CM | POA: Diagnosis not present

## 2015-03-15 DIAGNOSIS — Z23 Encounter for immunization: Secondary | ICD-10-CM | POA: Diagnosis not present

## 2015-03-15 DIAGNOSIS — Z8619 Personal history of other infectious and parasitic diseases: Secondary | ICD-10-CM

## 2015-03-15 DIAGNOSIS — F329 Major depressive disorder, single episode, unspecified: Secondary | ICD-10-CM

## 2015-03-15 DIAGNOSIS — E1169 Type 2 diabetes mellitus with other specified complication: Secondary | ICD-10-CM

## 2015-03-15 LAB — POCT GLYCOSYLATED HEMOGLOBIN (HGB A1C): Hemoglobin A1C: 6.2

## 2015-03-15 MED ORDER — ALPRAZOLAM 1 MG PO TABS: 1.0000 mg | ORAL_TABLET | Freq: Three times a day (TID) | ORAL | Status: DC | PRN

## 2015-03-15 MED ORDER — METFORMIN HCL 1000 MG PO TABS: 1000.0000 mg | ORAL_TABLET | Freq: Two times a day (BID) | ORAL | Status: DC

## 2015-03-15 MED ORDER — SIMVASTATIN 40 MG PO TABS: 40.0000 mg | ORAL_TABLET | Freq: Every day | ORAL | Status: DC

## 2015-03-15 MED ORDER — CITALOPRAM HYDROBROMIDE 40 MG PO TABS: 40.0000 mg | ORAL_TABLET | Freq: Every day | ORAL | Status: DC

## 2015-03-15 MED ORDER — VALACYCLOVIR HCL 1 G PO TABS: 1000.0000 mg | ORAL_TABLET | Freq: Every day | ORAL | Status: DC

## 2015-03-15 NOTE — Progress Notes (Signed)
Name: Lauren [name]   MRN: [id number]    DOB: 11-Apr-1976   Date:03/15/2015       Progress Note  Subjective  Chief Complaint  Chief Complaint  Patient presents with  . Follow-up    3 mo  . Labs Only    Fasting  . Diabetes  . Anxiety  . Medication Refill    all meds    Diabetes She presents for her follow-up diabetic visit. She has type 2 diabetes mellitus. Her disease course has been stable. Hypoglycemia symptoms include nervousness/anxiousness. Pertinent negatives for hypoglycemia include no confusion or dizziness. Pertinent negatives for diabetes include no chest pain and no fatigue. Current diabetic treatment includes oral agent (monotherapy). She is following a diabetic diet. Her breakfast blood glucose range is generally 110-130 mg/dl.  Anxiety Presents for follow-up visit. Symptoms include depressed mood and nervous/anxious behavior. Patient reports no chest pain, confusion, decreased concentration, dizziness, excessive worry or insomnia.   Her past medical history is significant for depression. Past treatments include SSRIs and benzodiazephines.  Hyperlipidemia This is a chronic problem. The problem is controlled. Pertinent negatives include no chest pain, focal sensory loss or leg pain. Current antihyperlipidemic treatment includes statins. Risk factors for coronary artery disease include diabetes mellitus, dyslipidemia and obesity.  Depression      The patient presents with depression.  This is a chronic problem.  Associated symptoms include no decreased concentration, no fatigue, does not have insomnia, no decreased interest and not sad.  Past treatments include SSRIs - Selective serotonin reuptake inhibitors.  Previous treatment provided significant relief.  Past medical history includes anxiety and depression.   History of Genital Herpes She has history of genital herpes, currently on suppression therapy with daily Valtrex. No recent outbreak. No medication side effects. Past  Medical History  Diagnosis Date  . Diabetes mellitus without complication   . Hyperlipidemia   . Anxiety   . Genital herpes   . Chronic depression   . Vitamin D deficiency   . Allergic rhinitis   . Elevated LFTs     Past Surgical History  Procedure Laterality Date  . Hernia repair  09/25/2011  . Colonoscopy    . Cesarean section      x 4   . Tubal ligation      Family History  Problem Relation Age of Onset  . Hypertension Mother   . Hyperlipidemia Mother   . Arrhythmia Father 16    A-fib  . Heart disease Brother 43    stent x 1   . Alcoholism Brother   . Alcohol abuse Father   . Asthma Son   . Asperger's syndrome Son     Social History   Social History  . Marital Status: Single    Spouse Name: N/A  . Number of Children: 4  . Years of Education: N/A   Occupational History  .  [employer]   Social History Main Topics  . Smoking status: Current Every Day Smoker -- 0.50 packs/day for 25 years    Types: Cigarettes  . Smokeless tobacco: Not on file  . Alcohol Use: 0.0 oz/week    0 Standard drinks or equivalent per week     Comment: occasionally  . Drug Use: No  . Sexual Activity: Not on file   Other Topics Concern  . Not on file   Social History Narrative    Current outpatient prescriptions:  .  albuterol (PROVENTIL HFA) 108 (90 BASE) MCG/ACT inhaler, Inhale 2 puffs into the  lungs every 6 (six) hours as needed., Disp: , Rfl:  .  ALPRAZolam (XANAX) 1 MG tablet, Take 1 tablet (1 mg total) by mouth 3 (three) times daily as needed for anxiety., Disp: 90 tablet, Rfl: 2 .  citalopram (CELEXA) 40 MG tablet, Take 1 tablet (40 mg total) by mouth daily., Disp: 90 tablet, Rfl: 0 .  metFORMIN (GLUCOPHAGE) 1000 MG tablet, Take 1 tablet (1,000 mg total) by mouth 2 (two) times daily with a meal., Disp: 180 tablet, Rfl: 0 .  simvastatin (ZOCOR) 40 MG tablet, Take 1 tablet (40 mg total) by mouth daily at 6 PM., Disp: 90 tablet, Rfl: 0 .  valACYclovir (VALTREX) 1000 MG tablet,  Take 1 tablet (1,000 mg total) by mouth daily., Disp: 90 tablet, Rfl: 3 .  Vitamin D, Ergocalciferol, (DRISDOL) 50000 UNITS CAPS capsule, Take 1 capsule by mouth once a week., Disp: , Rfl:   No Known Allergies   Review of Systems  Constitutional: Negative for fever, chills and fatigue.  Cardiovascular: Negative for chest pain.  Skin: Negative for itching and rash.  Neurological: Negative for dizziness.  Psychiatric/Behavioral: Positive for depression. Negative for confusion and decreased concentration. The patient is nervous/anxious. The patient does not have insomnia.     Objective  Filed Vitals:   03/15/15 0908  BP: 118/61  Pulse: 86  Temp: 98.6 F (37 C)  TempSrc: Oral  Resp: 18  Height: 5\' 2"  (1.575 m)  Weight: 210 lb 12.8 oz (95.618 kg)  SpO2: 96%    Physical Exam  Constitutional: She is oriented to person, place, and time and well-developed, well-nourished, and in no distress.  Cardiovascular: Normal rate and regular rhythm.   Pulmonary/Chest: Effort normal and breath sounds normal.  Abdominal: Soft. Bowel sounds are normal.  Neurological: She is alert and oriented to person, place, and time.  Psychiatric: Memory, affect and judgment normal.  Nursing note and vitals reviewed.   Assessment & Plan  1. Need for immunization against influenza  - Flu Vaccine QUAD 36+ mos IM  2. Diabetes mellitus type 2 in obese  - metFORMIN (GLUCOPHAGE) 1000 MG tablet; Take 1 tablet (1,000 mg total) by mouth 2 (two) times daily with a meal.  Dispense: 180 tablet; Refill: 0 - POCT HgB A1C  3. Anxiety and depression  - ALPRAZolam (XANAX) 1 MG tablet; Take 1 tablet (1 mg total) by mouth 3 (three) times daily as needed for anxiety.  Dispense: 90 tablet; Refill: 2 - citalopram (CELEXA) 40 MG tablet; Take 1 tablet (40 mg total) by mouth daily.  Dispense: 90 tablet; Refill: 0  4. History of herpes genitalis  - valACYclovir (VALTREX) 1000 MG tablet; Take 1 tablet (1,000 mg total) by  mouth daily.  Dispense: 90 tablet; Refill: 3  5. Hyperlipidemia  - simvastatin (ZOCOR) 40 MG tablet; Take 1 tablet (40 mg total) by mouth daily at 6 PM.  Dispense: 90 tablet; Refill: 0 - Lipid Profile - Comprehensive Metabolic Panel (CMET)    Syed [facility]ad [facility] 03/15/2015 9:27 AM

## 2015-03-16 LAB — COMPREHENSIVE METABOLIC PANEL
ALT: 19 IU/L (ref 0–32)
AST: 19 IU/L (ref 0–40)
Albumin/Globulin Ratio: 1.9 (ref 1.1–2.5)
Albumin: 4.6 g/dL (ref 3.5–5.5)
Alkaline Phosphatase: 56 IU/L (ref 39–117)
BUN/Creatinine Ratio: 15 (ref 8–20)
BUN: 10 mg/dL (ref 6–20)
Bilirubin Total: 0.4 mg/dL (ref 0.0–1.2)
CO2: 26 mmol/L (ref 18–29)
Calcium: 9.4 mg/dL (ref 8.7–10.2)
Chloride: 100 mmol/L (ref 97–108)
Creatinine, Ser: 0.65 mg/dL (ref 0.57–1.00)
GFR calc Af Amer: 129 mL/min/{1.73_m2} (ref >59)
GFR calc non Af Amer: 112 mL/min/{1.73_m2} (ref >59)
Globulin, Total: 2.4 g/dL (ref 1.5–4.5)
Glucose: 98 mg/dL (ref 65–99)
Potassium: 4.3 mmol/L (ref 3.5–5.2)
Sodium: 139 mmol/L (ref 134–144)
Total Protein: 7 g/dL (ref 6.0–8.5)

## 2015-03-16 LAB — LIPID PANEL
Chol/HDL Ratio: 4.3 ratio units (ref 0.0–4.4)
Cholesterol, Total: 162 mg/dL (ref 100–199)
HDL: 38 mg/dL — ABNORMAL LOW (ref >39)
LDL Calculated: 97 mg/dL (ref 0–99)
Triglycerides: 137 mg/dL (ref 0–149)
VLDL Cholesterol Cal: 27 mg/dL (ref 5–40)

## 2015-07-05 ENCOUNTER — Other Ambulatory Visit: Payer: Self-pay | Admitting: Family Medicine

## 2015-08-22 ENCOUNTER — Ambulatory Visit: Payer: Managed Care, Other (non HMO) | Admitting: Family Medicine

## 2015-08-29 ENCOUNTER — Encounter: Payer: Self-pay | Admitting: Family Medicine

## 2015-08-29 ENCOUNTER — Ambulatory Visit (INDEPENDENT_AMBULATORY_CARE_PROVIDER_SITE_OTHER): Payer: Managed Care, Other (non HMO) | Admitting: Family Medicine

## 2015-08-29 VITALS — BP 120/71 | HR 98 | Temp 99.0°F | Resp 18 | Ht 62.0 in | Wt 218.8 lb

## 2015-08-29 DIAGNOSIS — F419 Anxiety disorder, unspecified: Principal | ICD-10-CM

## 2015-08-29 DIAGNOSIS — F418 Other specified anxiety disorders: Secondary | ICD-10-CM

## 2015-08-29 DIAGNOSIS — F32A Depression, unspecified: Secondary | ICD-10-CM

## 2015-08-29 DIAGNOSIS — F329 Major depressive disorder, single episode, unspecified: Secondary | ICD-10-CM

## 2015-08-29 NOTE — Progress Notes (Signed)
Name: Lauren [name]   MRN: [id number]    DOB: 1976/05/18   Date:08/29/2015       Progress Note  Subjective  Chief Complaint  Chief Complaint  Patient presents with  . Advice Only    Needs note for prescriptions - Xanax    HPI  Pt. Is here to request a note for her to be able to take medications (specifically Citalopram 40 mg daily) and work. She has long-history of anxiety and depression, has been on Citalopram 40 mg daily and Alprazolam 1 mg three times daily as needed. 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 Work hours are from 6 AM to 3 PM.  She takes Citalopram before leaving for work. States it does not make her sleepy, drowsy, or impaired in any way. Has been taking Citalopram for many years without incident. Similarly, she takes 1/2 tablet of Alprazolam during the day and 1 tablet at night as needed. This also does not make her drowsy, sleepy, or impaired in any way.  Past Medical History  Diagnosis Date  . Diabetes mellitus without complication ([place])   . Hyperlipidemia   . Anxiety   . Genital herpes   . Chronic depression   . Vitamin D deficiency   . Allergic rhinitis   . Elevated LFTs     Past Surgical History  Procedure Laterality Date  . Hernia repair  09/25/2011  . Colonoscopy    . Cesarean section      x 4   . Tubal ligation      Family History  Problem Relation Age of Onset  . Hypertension Mother   . Hyperlipidemia Mother   . Arrhythmia Father 65    A-fib  . Heart disease Brother 43    stent x 1   . Alcoholism Brother   . Alcohol abuse Father   . Asthma Son   . Asperger's syndrome Son     Social History   Social History  . Marital Status: Single    Spouse Name: N/A  . Number of Children: 4  . Years of Education: N/A   Occupational History  .  [employer]   Social History Main Topics  . Smoking status: Current Every Day Smoker -- 0.50 packs/day for 25 years    Types: Cigarettes  . Smokeless  tobacco: Not on file  . Alcohol Use: 0.0 oz/week    0 Standard drinks or equivalent per week     Comment: occasionally  . Drug Use: No  . Sexual Activity: Not on file   Other Topics Concern  . Not on file   Social History Narrative     Current outpatient prescriptions:  .  albuterol (PROVENTIL HFA) 108 (90 BASE) MCG/ACT inhaler, Inhale 2 puffs into the lungs every 6 (six) hours as needed., Disp: , Rfl:  .  ALPRAZolam (XANAX) 1 MG tablet, Take 1 tablet (1 mg total) by mouth 3 (three) times daily as needed for anxiety., Disp: 90 tablet, Rfl: 2 .  citalopram (CELEXA) 40 MG tablet, Take 1 tablet (40 mg total) by mouth daily., Disp: 90 tablet, Rfl: 0 .  metFORMIN (GLUCOPHAGE) 1000 MG tablet, Take 1 tablet (1,000 mg total) by mouth 2 (two) times daily with a meal., Disp: 180 tablet, Rfl: 0 .  simvastatin (ZOCOR) 40 MG tablet, Take 1 tablet (40 mg total) by mouth daily at 6 PM., Disp: 90 tablet, Rfl: 0 .  valACYclovir (VALTREX) 1000  MG tablet, Take 1 tablet (1,000 mg total) by mouth daily., Disp: 90 tablet, Rfl: 3 .  Vitamin D, Ergocalciferol, (DRISDOL) 50000 UNITS CAPS capsule, Take 1 capsule by mouth once a week. Reported on 08/29/2015, Disp: , Rfl:   No Known Allergies   Review of Systems  Constitutional: Negative for fever, chills and weight loss.  Psychiatric/Behavioral: Positive for depression. The patient is nervous/anxious.       Objective  Filed Vitals:   08/29/15 1147  BP: 120/71  Pulse: 98  Temp: 99 F (37.2 C)  TempSrc: Oral  Resp: 18  Height: 5\' 2"  (1.575 m)  Weight: 218 lb 12.8 oz (99.247 kg)  SpO2: 97%    Physical Exam  Constitutional: She is oriented to person, place, and time and well-developed, well-nourished, and in no distress.  Cardiovascular: Normal rate and regular rhythm.   Pulmonary/Chest: Effort normal and breath sounds normal.  Neurological: She is alert and oriented to person, place, and time.  Psychiatric: Mood, memory, affect and judgment  normal.  Nursing note and vitals reviewed.    Assessment & Plan  1. Anxiety and depression Provided a note on official letterhead stating the medical necessity for taking Alprazolam and Citalopram every day. Patient signed the form allowing [place] to release the medical information to her employer.   Bethan Adamek [facility]ad [facility] 08/29/2015 11:53 AM

## 2015-09-19 ENCOUNTER — Telehealth: Payer: Self-pay | Admitting: Family Medicine

## 2015-09-19 ENCOUNTER — Encounter: Payer: Self-pay | Admitting: Family Medicine

## 2015-09-19 ENCOUNTER — Ambulatory Visit (INDEPENDENT_AMBULATORY_CARE_PROVIDER_SITE_OTHER): Payer: Managed Care, Other (non HMO) | Admitting: Family Medicine

## 2015-09-19 ENCOUNTER — Other Ambulatory Visit: Payer: Self-pay | Admitting: Family Medicine

## 2015-09-19 VITALS — BP 120/68 | HR 102 | Temp 99.3°F | Resp 19 | Ht 62.0 in | Wt 224.1 lb

## 2015-09-19 DIAGNOSIS — K219 Gastro-esophageal reflux disease without esophagitis: Secondary | ICD-10-CM | POA: Diagnosis not present

## 2015-09-19 DIAGNOSIS — Z1239 Encounter for other screening for malignant neoplasm of breast: Secondary | ICD-10-CM | POA: Diagnosis not present

## 2015-09-19 DIAGNOSIS — Z0000 Encounter for general adult medical examination without abnormal findings: Secondary | ICD-10-CM | POA: Diagnosis not present

## 2015-09-19 DIAGNOSIS — N949 Unspecified condition associated with female genital organs and menstrual cycle: Secondary | ICD-10-CM | POA: Diagnosis not present

## 2015-09-19 DIAGNOSIS — R102 Pelvic and perineal pain: Secondary | ICD-10-CM

## 2015-09-19 DIAGNOSIS — G8929 Other chronic pain: Secondary | ICD-10-CM | POA: Insufficient documentation

## 2015-09-19 MED ORDER — RANITIDINE HCL 150 MG PO TABS: 150.0000 mg | ORAL_TABLET | Freq: Every day | ORAL | Status: DC

## 2015-09-19 NOTE — Progress Notes (Signed)
Name: Lauren [name]   MRN: [id number]    DOB: 1976/04/05   Date:09/19/2015       Progress Note  Subjective  Chief Complaint  Chief Complaint  Patient presents with  . Annual Exam    CPE w/pap    HPI  Pt. Is here for Physical Exam with Pap Smear. Last Mammogram was in December 2015, was Bi-RADS Category 1.     Past Medical History  Diagnosis Date  . Diabetes mellitus without complication ([place])   . Hyperlipidemia   . Anxiety   . Genital herpes   . Chronic depression   . Vitamin D deficiency   . Allergic rhinitis   . Elevated LFTs     Past Surgical History  Procedure Laterality Date  . Hernia repair  09/25/2011  . Colonoscopy    . Cesarean section      x 4   . Tubal ligation      Family History  Problem Relation Age of Onset  . Hypertension Mother   . Hyperlipidemia Mother   . Arrhythmia Father 51    A-fib  . Heart disease Brother 43    stent x 1   . Alcoholism Brother   . Alcohol abuse Father   . Asthma Son   . Asperger's syndrome Son     Social History   Social History  . Marital Status: Single    Spouse Name: N/A  . Number of Children: 4  . Years of Education: N/A   Occupational History  .  [employer]   Social History Main Topics  . Smoking status: Current Every Day Smoker -- 0.50 packs/day for 25 years    Types: Cigarettes  . Smokeless tobacco: Not on file  . Alcohol Use: 0.0 oz/week    0 Standard drinks or equivalent per week     Comment: occasionally  . Drug Use: No  . Sexual Activity: Not on file   Other Topics Concern  . Not on file   Social History Narrative     Current outpatient prescriptions:  .  albuterol (PROVENTIL HFA) 108 (90 BASE) MCG/ACT inhaler, Inhale 2 puffs into the lungs every 6 (six) hours as needed., Disp: , Rfl:  .  ALPRAZolam (XANAX) 1 MG tablet, Take 1 tablet (1 mg total) by mouth 3 (three) times daily as needed for anxiety., Disp: 90 tablet, Rfl: 2 .  citalopram (CELEXA) 40 MG tablet, Take 1 tablet (40 mg total) by  mouth daily., Disp: 90 tablet, Rfl: 0 .  metFORMIN (GLUCOPHAGE) 1000 MG tablet, Take 1 tablet (1,000 mg total) by mouth 2 (two) times daily with a meal., Disp: 180 tablet, Rfl: 0 .  simvastatin (ZOCOR) 40 MG tablet, Take 1 tablet (40 mg total) by mouth daily at 6 PM., Disp: 90 tablet, Rfl: 0 .  valACYclovir (VALTREX) 1000 MG tablet, Take 1 tablet (1,000 mg total) by mouth daily., Disp: 90 tablet, Rfl: 3 .  Vitamin D, Ergocalciferol, (DRISDOL) 50000 UNITS CAPS capsule, Take 1 capsule by mouth once a week. Reported on 09/19/2015, Disp: , Rfl:   No Known Allergies   Review of Systems  Constitutional: Positive for fever, chills and malaise/fatigue. Negative for weight loss.  HENT: Positive for congestion.   Eyes: Negative for blurred vision and double vision.  Respiratory: Positive for cough. Negative for shortness of breath.   Cardiovascular: Positive for chest pain (intermittent for 2 months). Negative for palpitations.  Gastrointestinal: Positive for heartburn. Negative for nausea, vomiting, abdominal pain, diarrhea, constipation  and blood in stool.  Genitourinary: Negative for dysuria and hematuria.  Musculoskeletal: Negative for back pain and joint pain.  Neurological: Positive for headaches.  Psychiatric/Behavioral: Positive for depression. The patient is nervous/anxious.      Objective  Filed Vitals:   09/19/15 1056  BP: 120/68  Pulse: 102  Temp: 99.3 F (37.4 C)  TempSrc: Oral  Resp: 19  Height: 5\' 2"  (1.575 m)  Weight: 224 lb 1.6 oz (101.651 kg)  SpO2: 97%    Physical Exam  Constitutional: She is oriented to person, place, and time and well-developed, well-nourished, and in no distress.  HENT:  Head: Normocephalic and atraumatic.  Mouth/Throat: No posterior oropharyngeal erythema.  R. Ear canal cerumen impaction.  Neck: No thyroid mass present.  Cardiovascular: Normal rate and regular rhythm.   Pulmonary/Chest: Effort normal and breath sounds normal.  Breast exam  deferred.  Abdominal: Soft. Bowel sounds are normal.  Genitourinary: Uterus normal and cervix normal. Cervix exhibits no lesion and no tenderness. Left adnexum displays tenderness.  Left sided pain during bimanual exam, pt. Reports 1 year history of pain in her left lower pelvic area prior to onset of menstrual cycle, then resolves and returns 2 weeks after her cycle has finished.  Neurological: She is alert and oriented to person, place, and time.  Psychiatric: Mood, memory, affect and judgment normal.  Nursing note and vitals reviewed.    Assessment & Plan  1. Annual physical exam Obtain age appropriate laboratory and screening studies. - Cytology - PAP - Lipid Profile - Comprehensive Metabolic Panel (CMET) - TSH - Vitamin D (25 hydroxy)  2. Gastroesophageal reflux disease, esophagitis presence not specified Patient complains of persistent and recurrent heartburn, will advise to start taking Zantac 150 milligrams daily for 30 days and reevaluate. - ranitidine (ZANTAC) 150 MG tablet; Take 1 tablet (150 mg total) by mouth at bedtime.  Dispense: 30 tablet; Refill: 0  3. Screening for breast cancer  - MM Digital Screening; Future  4. Chronic pelvic pain in female Left-sided cyclic pelvic pain and pressure, tenderness over the left side during bimanual exam. Will obtain pelvic ultrasound to rule out ovarian pathology. - US Pelvis Comple; Future   Taunya Goral Asad [facility] 09/19/2015 11:10 AM

## 2015-09-19 NOTE — Telephone Encounter (Signed)
Patient was seen today but did not receive a refill on any of her medications. Please send Alprazolam, Citalopram, Simvastatin, and Valtrex to [facility]. States that she has a prescription for Alprazolam but it has expired and she is completely out.

## 2015-09-22 LAB — PAP LB, RFX HPV ASCU: PAP Smear Comment: 0

## 2015-09-27 NOTE — Telephone Encounter (Signed)
Patient was seen for complete physical exam and not for medication refills. Please schedule for an appointment to discuss and refill her medications.

## 2015-09-27 NOTE — Telephone Encounter (Signed)
Routed to Dr. Shah for approval 

## 2015-09-28 ENCOUNTER — Telehealth: Payer: Self-pay

## 2015-09-28 LAB — COMPREHENSIVE METABOLIC PANEL
ALT: 40 IU/L — ABNORMAL HIGH (ref 0–32)
AST: 33 IU/L (ref 0–40)
Albumin/Globulin Ratio: 1.7 (ref 1.2–2.2)
Albumin: 4.2 g/dL (ref 3.5–5.5)
Alkaline Phosphatase: 56 IU/L (ref 39–117)
BUN/Creatinine Ratio: 14 (ref 9–23)
BUN: 10 mg/dL (ref 6–24)
Bilirubin Total: 0.4 mg/dL (ref 0.0–1.2)
CO2: 24 mmol/L (ref 18–29)
Calcium: 9.1 mg/dL (ref 8.7–10.2)
Chloride: 99 mmol/L (ref 96–106)
Creatinine, Ser: 0.7 mg/dL (ref 0.57–1.00)
GFR calc Af Amer: 125 mL/min/{1.73_m2} (ref >59)
GFR calc non Af Amer: 109 mL/min/{1.73_m2} (ref >59)
Globulin, Total: 2.5 g/dL (ref 1.5–4.5)
Glucose: 162 mg/dL — ABNORMAL HIGH (ref 65–99)
Potassium: 4.4 mmol/L (ref 3.5–5.2)
Sodium: 138 mmol/L (ref 134–144)
Total Protein: 6.7 g/dL (ref 6.0–8.5)

## 2015-09-28 LAB — LIPID PANEL
Chol/HDL Ratio: 4.8 ratio units — ABNORMAL HIGH (ref 0.0–4.4)
Cholesterol, Total: 155 mg/dL (ref 100–199)
HDL: 32 mg/dL — ABNORMAL LOW (ref >39)
LDL Calculated: 97 mg/dL (ref 0–99)
Triglycerides: 129 mg/dL (ref 0–149)
VLDL Cholesterol Cal: 26 mg/dL (ref 5–40)

## 2015-09-28 LAB — VITAMIN D 25 HYDROXY (VIT D DEFICIENCY, FRACTURES): Vit D, 25-Hydroxy: 18 ng/mL — ABNORMAL LOW (ref 30.0–100.0)

## 2015-09-28 LAB — TSH: TSH: 1.49 u[IU]/mL (ref 0.450–4.500)

## 2015-09-28 MED ORDER — VITAMIN D (ERGOCALCIFEROL) 1.25 MG (50000 UNIT) PO CAPS: 50000.0000 [IU] | ORAL_CAPSULE | ORAL | Status: DC

## 2015-09-28 NOTE — Telephone Encounter (Signed)
Lab results have been reported to patient and a prescription for Vitamin D 50,000 units take by mouth 1 capsule once a week for 12 weeks has been sent to [facility] [address] per Dr. Manuella Ghazi, patient has been notified

## 2015-10-15 ENCOUNTER — Other Ambulatory Visit: Payer: Self-pay | Admitting: Family Medicine

## 2015-10-27 ENCOUNTER — Other Ambulatory Visit: Payer: Self-pay | Admitting: Otolaryngology

## 2015-10-27 DIAGNOSIS — H7101 Cholesteatoma of attic, right ear: Secondary | ICD-10-CM

## 2015-11-04 ENCOUNTER — Ambulatory Visit
Admission: RE | Admit: 2015-11-04 | Discharge: 2015-11-04 | Disposition: A | Discharge status: Home / Self Care | Payer: Managed Care, Other (non HMO) | Source: Ambulatory Visit | Attending: Otolaryngology | Admitting: Otolaryngology

## 2015-11-04 DIAGNOSIS — H7101 Cholesteatoma of attic, right ear: Secondary | ICD-10-CM | POA: Diagnosis not present

## 2015-11-14 ENCOUNTER — Other Ambulatory Visit: Payer: Self-pay | Admitting: Family Medicine

## 2015-11-25 ENCOUNTER — Encounter
Admission: RE | Admit: 2015-11-25 | Discharge: 2015-11-25 | Disposition: A | Discharge status: Home / Self Care | Payer: Managed Care, Other (non HMO) | Source: Ambulatory Visit | Attending: Otolaryngology | Admitting: Otolaryngology

## 2015-11-25 DIAGNOSIS — Z01810 Encounter for preprocedural cardiovascular examination: Secondary | ICD-10-CM | POA: Insufficient documentation

## 2015-11-25 DIAGNOSIS — Z01812 Encounter for preprocedural laboratory examination: Secondary | ICD-10-CM | POA: Insufficient documentation

## 2015-11-25 HISTORY — DX: Gastro-esophageal reflux disease without esophagitis: K21.9

## 2015-11-25 LAB — BASIC METABOLIC PANEL
Anion gap: 7 (ref 5–15)
BUN: 9 mg/dL (ref 6–20)
CO2: 25 mmol/L (ref 22–32)
Calcium: 8.8 mg/dL — ABNORMAL LOW (ref 8.9–10.3)
Chloride: 103 mmol/L (ref 101–111)
Creatinine, Ser: 0.68 mg/dL (ref 0.44–1.00)
GFR calc Af Amer: >60 mL/min (ref >60)
GFR calc non Af Amer: >60 mL/min (ref >60)
Glucose, Bld: 364 mg/dL — ABNORMAL HIGH (ref 65–99)
Potassium: 4 mmol/L (ref 3.5–5.1)
Sodium: 135 mmol/L (ref 135–145)

## 2015-11-25 LAB — SURGICAL PCR SCREEN
MRSA, PCR: NEGATIVE
Staphylococcus aureus: NEGATIVE

## 2015-11-25 NOTE — Pre-Procedure Instructions (Signed)
Blood glucose= 364 at today's PAT visit.  Medical clearance faxed to Dr. Sonny Masters office.

## 2015-11-25 NOTE — Pre-Procedure Instructions (Signed)
Medical clearance sent to pt's PCP Dr. Manuella Ghazi at [facility] medical related to BG of 364 and today's EKG tracing.

## 2015-11-25 NOTE — Pre-Procedure Instructions (Signed)
Called [facility] to confirm they received Medical clearance, we received the OK that the fax did go through.  Office is closed.

## 2015-11-25 NOTE — Patient Instructions (Signed)
  Your procedure is scheduled on: Wednesday December 07, 2015. Report to Same Day Surgery. To find out your arrival time please call [phone] between 1PM - 3PM on Tuesday December 06, 2015.  Remember: Instructions that are not followed completely may result in serious medical risk, up to and including death, or upon the discretion of your surgeon and anesthesiologist your surgery may need to be rescheduled.    _x___ 1. Do not eat food or drink liquids after midnight. No gum chewing or hard candies.     _x___ 2. No Alcohol for 24 hours before or after surgery.   ____ 3. Bring all medications with you on the day of surgery if instructed.    __x__ 4. Notify your doctor if there is any change in your medical condition     (cold, fever, infections).     __x__ Do not smoke 24 hours prior to surgery.     Do not wear jewelry, make-up, hairpins, clips or nail polish.  Do not wear lotions, powders, or perfumes. You may wear deodorant.  Do not shave 48 hours prior to surgery. Men may shave face and neck.  Do not bring valuables to the hospital.    [facility] is not responsible for any belongings or valuables.               Contacts, dentures or bridgework may not be worn into surgery.  Leave your suitcase in the car. After surgery it may be brought to your room.  For patients admitted to the hospital, discharge time is determined by your treatment team.   Patients discharged the day of surgery will not be allowed to drive home.    Please read over the following fact sheets that you were given:   [facility] Preparing for Surgery  _x___ Take these medicines the morning of surgery with A SIP OF WATER:    1. ranitidine (ZANTAC)  2. ALPRAZolam Duanne Moron) If needed.    ____ Fleet Enema (as directed)   ____ Use CHG Soap as directed on instruction sheet  __x_ Use inhalers on the day of surgery and bring to hospital day of surgery  __x__ Stop metformin 2 days prior to surgery December 05, 2015.    ____ Take 1/2 of usual insulin dose the night before surgery and none on the morning of surgery.   ____ Stop Coumadin/Plavix/aspirin on does not apply.  __x__ Stop Anti-inflammatories such as Advil, Aleve, Ibuprofen, Motrin, Naproxen, Naprosyn, Goodies powders or aspirin products. OK to take Tylenol.   ____ Stop supplements until after surgery.    ____ Bring C-Pap to the hospital.

## 2015-11-29 ENCOUNTER — Ambulatory Visit (INDEPENDENT_AMBULATORY_CARE_PROVIDER_SITE_OTHER): Payer: Managed Care, Other (non HMO) | Admitting: Family Medicine

## 2015-11-29 ENCOUNTER — Encounter: Payer: Self-pay | Admitting: Family Medicine

## 2015-11-29 VITALS — BP 114/69 | HR 96 | Temp 98.9°F | Resp 16 | Ht 62.0 in | Wt 223.5 lb

## 2015-11-29 DIAGNOSIS — E785 Hyperlipidemia, unspecified: Secondary | ICD-10-CM | POA: Diagnosis not present

## 2015-11-29 DIAGNOSIS — R05 Cough: Secondary | ICD-10-CM | POA: Diagnosis not present

## 2015-11-29 DIAGNOSIS — F329 Major depressive disorder, single episode, unspecified: Secondary | ICD-10-CM

## 2015-11-29 DIAGNOSIS — F418 Other specified anxiety disorders: Secondary | ICD-10-CM

## 2015-11-29 DIAGNOSIS — E119 Type 2 diabetes mellitus without complications: Secondary | ICD-10-CM | POA: Diagnosis not present

## 2015-11-29 DIAGNOSIS — E669 Obesity, unspecified: Secondary | ICD-10-CM

## 2015-11-29 DIAGNOSIS — Z8619 Personal history of other infectious and parasitic diseases: Secondary | ICD-10-CM | POA: Diagnosis not present

## 2015-11-29 DIAGNOSIS — K219 Gastro-esophageal reflux disease without esophagitis: Secondary | ICD-10-CM | POA: Diagnosis not present

## 2015-11-29 DIAGNOSIS — F419 Anxiety disorder, unspecified: Principal | ICD-10-CM

## 2015-11-29 DIAGNOSIS — E1169 Type 2 diabetes mellitus with other specified complication: Secondary | ICD-10-CM

## 2015-11-29 DIAGNOSIS — R058 Other specified cough: Secondary | ICD-10-CM | POA: Insufficient documentation

## 2015-11-29 DIAGNOSIS — F32A Depression, unspecified: Secondary | ICD-10-CM

## 2015-11-29 LAB — POCT GLYCOSYLATED HEMOGLOBIN (HGB A1C): Hemoglobin A1C: 9.3

## 2015-11-29 LAB — GLUCOSE, POCT (MANUAL RESULT ENTRY): POC Glucose: 148 mg/dl — AB (ref 70–99)

## 2015-11-29 MED ORDER — RANITIDINE HCL 150 MG PO TABS: 150.0000 mg | ORAL_TABLET | Freq: Every day | ORAL | Status: DC

## 2015-11-29 MED ORDER — CITALOPRAM HYDROBROMIDE 40 MG PO TABS: 40.0000 mg | ORAL_TABLET | Freq: Every day | ORAL | Status: DC

## 2015-11-29 MED ORDER — ALPRAZOLAM 1 MG PO TABS
1.0000 mg | ORAL_TABLET | Freq: Three times a day (TID) | ORAL | Status: DC | PRN
Start: 2015-11-29 — End: 2016-05-17

## 2015-11-29 MED ORDER — SIMVASTATIN 40 MG PO TABS: 40.0000 mg | ORAL_TABLET | Freq: Every day | ORAL | Status: DC

## 2015-11-29 MED ORDER — ALPRAZOLAM 1 MG PO TABS: 1.0000 mg | ORAL_TABLET | Freq: Three times a day (TID) | ORAL | Status: DC | PRN

## 2015-11-29 MED ORDER — VALACYCLOVIR HCL 1 G PO TABS
1000.0000 mg | ORAL_TABLET | Freq: Every day | ORAL | Status: DC
Start: 2015-11-29 — End: 2016-03-16

## 2015-11-29 MED ORDER — METFORMIN HCL 1000 MG PO TABS
1000.0000 mg | ORAL_TABLET | Freq: Two times a day (BID) | ORAL | Status: DC
Start: 2015-11-29 — End: 2015-12-05

## 2015-11-29 MED ORDER — ALBUTEROL SULFATE HFA 108 (90 BASE) MCG/ACT IN AERS: 2.0000 | INHALATION_SPRAY | Freq: Four times a day (QID) | RESPIRATORY_TRACT | Status: DC | PRN

## 2015-11-29 NOTE — Progress Notes (Signed)
Name: Lauren [name]   MRN: [id number]    DOB: Aug 21, 1975   Date:11/29/2015       Progress Note  Subjective  Chief Complaint  Chief Complaint  Patient presents with  . Medication Refill    Diabetes She presents for her follow-up diabetic visit. She has type 2 diabetes mellitus. Her disease course has been stable. Hypoglycemia symptoms include dizziness and nervousness/anxiousness. Associated symptoms include chest pain (pain associated with reflux.), fatigue and polydipsia. Pertinent negatives for diabetes include no polyuria. Current diabetic treatment includes oral agent (monotherapy). She participates in exercise three times a week (Walking 3x/week.). Her breakfast blood glucose range is generally 110-130 mg/dl. An ACE inhibitor/angiotensin II receptor blocker is not being taken.  Hyperlipidemia This is a chronic problem. The problem is controlled. Recent lipid tests were reviewed and are low (HDL below normal.). Associated symptoms include chest pain (pain associated with reflux.) and shortness of breath. Pertinent negatives include no leg pain or myalgias. Current antihyperlipidemic treatment includes statins.  Anxiety Presents for follow-up visit. The problem has been gradually worsening (Recently worse because she has not been on medication.). Symptoms include chest pain (pain associated with reflux.), dizziness, excessive worry, nervous/anxious behavior and shortness of breath. Patient reports no panic. The severity of symptoms is moderate and causing significant distress.   Her past medical history is significant for depression. Past treatments include benzodiazephines. Compliance with prior treatments: Has not taken Alprazolam in the last 6 weeks.  Depression      The patient presents with depression.  This is a chronic problem.  The onset quality is gradual.   The problem has been gradually worsening (Worse in last 6 weeks as patient has not been on medication.) since onset.  Associated  symptoms include fatigue, helplessness, hopelessness, decreased interest and sad.  Associated symptoms include no myalgias.  Past treatments include SSRIs - Selective serotonin reuptake inhibitors.  Compliance with prior treatments: Ran out of Citalopram 6 weeks ago and has not gotten a refill.  Past medical history includes anxiety and depression.   Gastroesophageal Reflux She complains of chest pain (pain associated with reflux.) and heartburn. She reports no abdominal pain, no coughing or no dysphagia. This is a chronic problem. The problem has been gradually improving. The symptoms are aggravated by certain foods (Sauces, spaghetti sauce.). Associated symptoms include fatigue. She has tried a histamine-2 antagonist for the symptoms.  Shortness of Breath This is a chronic problem. Episode frequency: worse in SPring season with outdoor allergens. The problem has been unchanged. The average episode lasts 8 years. Associated symptoms include chest pain (pain associated with reflux.). Pertinent negatives include no abdominal pain, fever, leg pain or rash. The symptoms are aggravated by pollens and weather changes. She has tried beta agonist inhalers for the symptoms. The treatment provided significant relief.   Herpes Genitalis:  Pt. Has history of Herpes genitalis, outbreaks associated with periods of stress, acute illness etc. She takes Valtrex 1000 mg daily for prophylaxis and 1000 mg twice daily for break outs. She reports no fevers, chills, or skin rashes.   Past Medical History  Diagnosis Date  . Diabetes mellitus without complication ([place])   . Hyperlipidemia   . Anxiety   . Genital herpes   . Chronic depression   . Vitamin D deficiency   . Allergic rhinitis   . Elevated LFTs   . GERD (gastroesophageal reflux disease)     Past Surgical History  Procedure Laterality Date  . Hernia repair  09/25/2011  .  Colonoscopy    . Cesarean section      x 4   . Tubal ligation    . Appendectomy       Family History  Problem Relation Age of Onset  . Hypertension Mother   . Hyperlipidemia Mother   . Arrhythmia Father 87    A-fib  . Heart disease Brother 43    stent x 1   . Alcoholism Brother   . Alcohol abuse Father   . Asthma Son   . Asperger's syndrome Son     Social History   Social History  . Marital Status: Single    Spouse Name: N/A  . Number of Children: 4  . Years of Education: N/A   Occupational History  .  [employer]   Social History Main Topics  . Smoking status: Current Every Day Smoker -- 0.50 packs/day for 25 years    Types: Cigarettes  . Smokeless tobacco: Not on file  . Alcohol Use: 0.0 oz/week    0 Standard drinks or equivalent per week     Comment: occasionally= 3 beers per month.  . Drug Use: No  . Sexual Activity: Not on file   Other Topics Concern  . Not on file   Social History Narrative     Current outpatient prescriptions:  .  albuterol (PROVENTIL HFA) 108 (90 BASE) MCG/ACT inhaler, Inhale 2 puffs into the lungs every 6 (six) hours as needed for wheezing or shortness of breath. , Disp: , Rfl:  .  ALPRAZolam (XANAX) 1 MG tablet, Take 1 tablet (1 mg total) by mouth 3 (three) times daily as needed for anxiety., Disp: 90 tablet, Rfl: 2 .  citalopram (CELEXA) 40 MG tablet, Take 1 tablet (40 mg total) by mouth daily., Disp: 90 tablet, Rfl: 0 .  fluticasone (FLONASE) 50 MCG/ACT nasal spray, Place 2 sprays into both nostrils daily., Disp: , Rfl: 11 .  metFORMIN (GLUCOPHAGE) 1000 MG tablet, Take 1 tablet (1,000 mg total) by mouth 2 (two) times daily with a meal., Disp: 180 tablet, Rfl: 0 .  ranitidine (ZANTAC) 150 MG tablet, TAKE 1 TABLET (150 MG TOTAL) BY MOUTH AT BEDTIME., Disp: 30 tablet, Rfl: 0 .  simvastatin (ZOCOR) 40 MG tablet, Take 1 tablet (40 mg total) by mouth daily at 6 PM., Disp: 90 tablet, Rfl: 0 .  valACYclovir (VALTREX) 1000 MG tablet, Take 1 tablet (1,000 mg total) by mouth daily., Disp: 90 tablet, Rfl: 3  No Known  Allergies   Review of Systems  Constitutional: Positive for fatigue. Negative for fever and chills.  Respiratory: Positive for shortness of breath. Negative for cough.   Cardiovascular: Positive for chest pain (pain associated with reflux.).  Gastrointestinal: Positive for heartburn. Negative for dysphagia and abdominal pain.  Musculoskeletal: Negative for myalgias.  Skin: Negative for itching and rash.  Neurological: Positive for dizziness.  Endo/Heme/Allergies: Positive for polydipsia.  Psychiatric/Behavioral: Positive for depression. The patient is nervous/anxious.    Objective  Filed Vitals:   11/29/15 1341  BP: 114/69  Pulse: 96  Temp: 98.9 F (37.2 C)  TempSrc: Oral  Resp: 16  Height: 5\' 2"  (1.575 m)  Weight: 223 lb 8 oz (101.379 kg)  SpO2: 97%    Physical Exam  Constitutional: She is oriented to person, place, and time and well-developed, well-nourished, and in no distress.  HENT:  Head: Normocephalic and atraumatic.  Cardiovascular: Normal rate, regular rhythm and normal heart sounds.  Exam reveals no gallop.   No murmur heard. Pulmonary/Chest: Effort  normal and breath sounds normal. She has no decreased breath sounds. She has no wheezes. She has no rhonchi.  Abdominal: Soft. Bowel sounds are normal. There is no tenderness.  Musculoskeletal:       Right ankle: She exhibits no swelling. No tenderness.       Left ankle: She exhibits no swelling. No tenderness.  Neurological: She is alert and oriented to person, place, and time.  Skin: Skin is warm, dry and intact.  Psychiatric: Mood, memory and judgment normal. She has a flat affect.  Nursing note and vitals reviewed.    Assessment & Plan  1. Anxiety and depression Restart on Alprazolam and Citalopram for anxiety and depression. Refills provided - ALPRAZolam (XANAX) 1 MG tablet; Take 1 tablet (1 mg total) by mouth 3 (three) times daily as needed for anxiety.  Dispense: 90 tablet; Refill: 2 - citalopram  (CELEXA) 40 MG tablet; Take 1 tablet (40 mg total) by mouth daily.  Dispense: 90 tablet; Refill: 0  2. Diabetes mellitus type 2 in obese (HCC) A1c is 9.3%, consistent with poorly controlled diabetes. Restart on metformin and patient will return to discuss the addition of a second oral agent for diabetes - metFORMIN (GLUCOPHAGE) 1000 MG tablet; Take 1 tablet (1,000 mg total) by mouth 2 (two) times daily with a meal.  Dispense: 180 tablet; Refill: 0 - POCT HgB A1C - POCT Glucose (CBG) - POCT UA - Microalbumin  3. History of herpes genitalis No active outbreak, patient takes Valtrex for prophylaxis. Refills provide - valACYclovir (VALTREX) 1000 MG tablet; Take 1 tablet (1,000 mg total) by mouth daily.  Dispense: 90 tablet; Refill: 3  4. Hyperlipidemia FLP from April 2017 reviewed. Continue on statin therapy - simvastatin (ZOCOR) 40 MG tablet; Take 1 tablet (40 mg total) by mouth daily at 6 PM.  Dispense: 90 tablet; Refill: 0  5. Nocturnal cough  - albuterol (PROVENTIL HFA) 108 (90 Base) MCG/ACT inhaler; Inhale 2 puffs into the lungs every 6 (six) hours as needed for wheezing or shortness of breath.  Dispense: 1 Inhaler; Refill: 3  6. Gastroesophageal reflux disease, esophagitis presence not specified  - ranitidine (ZANTAC) 150 MG tablet; Take 1 tablet (150 mg total) by mouth at bedtime.  Dispense: 90 tablet; Refill: 1   Rutger Salton [facility] 11/29/2015 2:07 PM

## 2015-11-30 NOTE — Pre-Procedure Instructions (Signed)
SPOKE TO DR [facility] AND PATIENT HAD RUN OUT OFMEDICATIONS. HE REFILLED AND WILL SEE HER AGAIN 12/05/15

## 2015-12-05 ENCOUNTER — Encounter: Payer: Self-pay | Admitting: Family Medicine

## 2015-12-05 ENCOUNTER — Ambulatory Visit (INDEPENDENT_AMBULATORY_CARE_PROVIDER_SITE_OTHER): Payer: Managed Care, Other (non HMO) | Admitting: Family Medicine

## 2015-12-05 VITALS — BP 130/76 | HR 101 | Temp 98.8°F | Resp 16 | Ht 62.0 in | Wt 222.0 lb

## 2015-12-05 DIAGNOSIS — E669 Obesity, unspecified: Secondary | ICD-10-CM

## 2015-12-05 DIAGNOSIS — H7101 Cholesteatoma of attic, right ear: Secondary | ICD-10-CM | POA: Diagnosis not present

## 2015-12-05 DIAGNOSIS — E119 Type 2 diabetes mellitus without complications: Secondary | ICD-10-CM

## 2015-12-05 DIAGNOSIS — E1169 Type 2 diabetes mellitus with other specified complication: Secondary | ICD-10-CM

## 2015-12-05 LAB — GLUCOSE, POCT (MANUAL RESULT ENTRY): POC Glucose: 147 mg/dl — AB (ref 70–99)

## 2015-12-05 MED ORDER — SITAGLIPTIN PHOSPHATE 100 MG PO TABS: 100.0000 mg | ORAL_TABLET | Freq: Every day | ORAL | Status: DC

## 2015-12-05 NOTE — Progress Notes (Signed)
Name: Lauren [name]   MRN: [id number]    DOB: 25-Oct-1975   Date:12/05/2015       Progress Note  Subjective  Chief Complaint  Chief Complaint  Patient presents with  . Surgical Clearance    Ear surgery with Dr.Bennet on 12/07/2015    Diabetes She presents for her follow-up diabetic visit. She has type 2 diabetes mellitus. Her disease course has been worsening. There are no hypoglycemic associated symptoms. Current diabetic treatment includes oral agent (monotherapy). She is following a diabetic and generally healthy diet. Her breakfast blood glucose range is generally 70-90 mg/dl. (AM fasting BG are generally controlled and in range. She recently started taking Metformin again and is making her sick on her stomach.)   Pt. Restated taking Metformin after her office visit last week where A1c was found to be elevated at 9.3%, she had not taken Metformin for over 6 weeks prior to her visit with me last week. Pt. Went for her pre-op for right ear surgery and her sugar was found to be elevated to be 374 mg/dL and was referred back to PCP for management.  Past Medical History  Diagnosis Date  . Diabetes mellitus without complication ([place])   . Hyperlipidemia   . Anxiety   . Genital herpes   . Chronic depression   . Vitamin D deficiency   . Allergic rhinitis   . Elevated LFTs   . GERD (gastroesophageal reflux disease)     Past Surgical History  Procedure Laterality Date  . Hernia repair  09/25/2011  . Colonoscopy    . Cesarean section      x 4   . Tubal ligation    . Appendectomy      Family History  Problem Relation Age of Onset  . Hypertension Mother   . Hyperlipidemia Mother   . Arrhythmia Father 75    A-fib  . Heart disease Brother 43    stent x 1   . Alcoholism Brother   . Alcohol abuse Father   . Asthma Son   . Asperger's syndrome Son     Social History   Social History  . Marital Status: Single    Spouse Name: N/A  . Number of Children: 4  . Years of  Education: N/A   Occupational History  .  [employer]   Social History Main Topics  . Smoking status: Current Every Day Smoker -- 0.50 packs/day for 25 years    Types: Cigarettes  . Smokeless tobacco: Not on file  . Alcohol Use: 0.0 oz/week    0 Standard drinks or equivalent per week     Comment: occasionally= 3 beers per month.  . Drug Use: No  . Sexual Activity: Not on file   Other Topics Concern  . Not on file   Social History Narrative     Current outpatient prescriptions:  .  albuterol (PROVENTIL HFA) 108 (90 Base) MCG/ACT inhaler, Inhale 2 puffs into the lungs every 6 (six) hours as needed for wheezing or shortness of breath., Disp: 1 Inhaler, Rfl: 3 .  ALPRAZolam (XANAX) 1 MG tablet, Take 1 tablet (1 mg total) by mouth 3 (three) times daily as needed for anxiety., Disp: 90 tablet, Rfl: 2 .  citalopram (CELEXA) 40 MG tablet, Take 1 tablet (40 mg total) by mouth daily., Disp: 90 tablet, Rfl: 0 .  fluticasone (FLONASE) 50 MCG/ACT nasal spray, Place 2 sprays into both nostrils daily., Disp: , Rfl: 11 .  metFORMIN (GLUCOPHAGE) 1000 MG tablet,  Take 1 tablet (1,000 mg total) by mouth 2 (two) times daily with a meal., Disp: 180 tablet, Rfl: 0 .  ranitidine (ZANTAC) 150 MG tablet, Take 1 tablet (150 mg total) by mouth at bedtime., Disp: 90 tablet, Rfl: 1 .  simvastatin (ZOCOR) 40 MG tablet, Take 1 tablet (40 mg total) by mouth daily at 6 PM., Disp: 90 tablet, Rfl: 0 .  valACYclovir (VALTREX) 1000 MG tablet, Take 1 tablet (1,000 mg total) by mouth daily., Disp: 90 tablet, Rfl: 3  No Known Allergies   Review of Systems  Constitutional: Positive for malaise/fatigue. Negative for fever and chills.  HENT: Positive for ear discharge and ear pain.   Gastrointestinal: Positive for nausea, abdominal pain and diarrhea.     Objective  Filed Vitals:   12/05/15 1028  BP: 130/76  Pulse: 101  Temp: 98.8 F (37.1 C)  TempSrc: Oral  Resp: 16  Height: 5\' 2"  (1.575 m)  Weight: 222 lb (100.699  kg)  SpO2: 96%    Physical Exam  Constitutional: She is oriented to person, place, and time and well-developed, well-nourished, and in no distress.  HENT:  Debris collection at the superior portion of right ear canal, TM gray, no  Drainage.  Cardiovascular: Normal rate, regular rhythm, S1 normal, S2 normal and normal heart sounds.   No murmur heard. Pulmonary/Chest: Effort normal and breath sounds normal. She has no decreased breath sounds. She has no wheezes. She has no rhonchi.  Abdominal: Soft. Bowel sounds are normal. There is no tenderness.  Neurological: She is alert and oriented to person, place, and time.  Psychiatric: Mood, memory, affect and judgment normal.  Nursing note and vitals reviewed.      Assessment & Plan  1. Diabetes mellitus type 2 in obese ([place]) DC metformin because of GI side effects. Start on Januvia 100 mg daily, educated on potential adverse effects. Expect blood glucose readings to start to improve steadily. - sitaGLIPtin (JANUVIA) 100 MG tablet; Take 1 tablet (100 mg total) by mouth daily.  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 We will complete paperwork sent over from ENTs office.   Lauren [name] [name] 12/05/2015 10:41 AM

## 2015-12-06 ENCOUNTER — Telehealth: Payer: Self-pay | Admitting: Family Medicine

## 2015-12-06 NOTE — Telephone Encounter (Signed)
Patient was seen on yesterday and was cleared for surgery. Becky (surgical coordinator) from Dr Richardson Landry (ENT) office has not received the surgical clearance form. The patient dropped of the paperwork yesterday afternoon (per [facility]) [phone]

## 2015-12-06 NOTE — Pre-Procedure Instructions (Signed)
CLEARED MODERATE RISK BY DR [facility]

## 2015-12-06 NOTE — Telephone Encounter (Signed)
Surgical clearance form along with office notes is completed and signed

## 2015-12-07 ENCOUNTER — Ambulatory Visit
Admission: RE | Admit: 2015-12-07 | Discharge: 2015-12-07 | Disposition: A | Discharge status: Home / Self Care | Payer: Managed Care, Other (non HMO) | Source: Ambulatory Visit | Attending: Otolaryngology | Admitting: Otolaryngology

## 2015-12-07 ENCOUNTER — Ambulatory Visit: Payer: Managed Care, Other (non HMO) | Admitting: Anesthesiology

## 2015-12-07 ENCOUNTER — Encounter: Payer: Self-pay | Admitting: *Deleted

## 2015-12-07 ENCOUNTER — Encounter
Admission: RE | Disposition: A | Discharge status: Home / Self Care | Payer: Self-pay | Source: Ambulatory Visit | Attending: Otolaryngology

## 2015-12-07 DIAGNOSIS — F17200 Nicotine dependence, unspecified, uncomplicated: Secondary | ICD-10-CM | POA: Diagnosis not present

## 2015-12-07 DIAGNOSIS — B009 Herpesviral infection, unspecified: Secondary | ICD-10-CM | POA: Insufficient documentation

## 2015-12-07 DIAGNOSIS — E785 Hyperlipidemia, unspecified: Secondary | ICD-10-CM | POA: Diagnosis not present

## 2015-12-07 DIAGNOSIS — Z8249 Family history of ischemic heart disease and other diseases of the circulatory system: Secondary | ICD-10-CM | POA: Diagnosis not present

## 2015-12-07 DIAGNOSIS — Z79899 Other long term (current) drug therapy: Secondary | ICD-10-CM | POA: Insufficient documentation

## 2015-12-07 DIAGNOSIS — Z7951 Long term (current) use of inhaled steroids: Secondary | ICD-10-CM | POA: Diagnosis not present

## 2015-12-07 DIAGNOSIS — H7191 Unspecified cholesteatoma, right ear: Secondary | ICD-10-CM | POA: Diagnosis not present

## 2015-12-07 DIAGNOSIS — Z9889 Other specified postprocedural states: Secondary | ICD-10-CM | POA: Diagnosis not present

## 2015-12-07 DIAGNOSIS — Z833 Family history of diabetes mellitus: Secondary | ICD-10-CM | POA: Diagnosis not present

## 2015-12-07 DIAGNOSIS — Z825 Family history of asthma and other chronic lower respiratory diseases: Secondary | ICD-10-CM | POA: Diagnosis not present

## 2015-12-07 DIAGNOSIS — J45909 Unspecified asthma, uncomplicated: Secondary | ICD-10-CM | POA: Insufficient documentation

## 2015-12-07 DIAGNOSIS — H9211 Otorrhea, right ear: Secondary | ICD-10-CM | POA: Diagnosis present

## 2015-12-07 DIAGNOSIS — H7101 Cholesteatoma of attic, right ear: Secondary | ICD-10-CM

## 2015-12-07 HISTORY — PX: TYMPANOMASTOIDECTOMY: SHX34

## 2015-12-07 LAB — GLUCOSE, CAPILLARY
Glucose-Capillary: 200 mg/dL — ABNORMAL HIGH (ref 65–99)
Glucose-Capillary: 135 mg/dL — ABNORMAL HIGH (ref 65–99)

## 2015-12-07 LAB — POCT PREGNANCY, URINE: Preg Test, Ur: NEGATIVE

## 2015-12-07 SURGERY — TYMPANOPLASTY, WITH MASTOIDECTOMY
Anesthesia: General | Laterality: Right | Wound class: Clean

## 2015-12-07 MED ORDER — SODIUM CHLORIDE 0.9 % IV SOLN
10000.0000 ug | INTRAVENOUS | Status: DC | PRN
  Administered 2015-12-07: 25 ug/min via INTRAVENOUS

## 2015-12-07 MED ORDER — GELATIN ABSORBABLE 12-7 MM EX MISC
CUTANEOUS | Status: AC
  Filled 2015-12-07: qty 1

## 2015-12-07 MED ORDER — ONDANSETRON HCL 4 MG/2ML IJ SOLN: 4.0000 mg | Freq: Once | INTRAMUSCULAR | Status: DC | PRN

## 2015-12-07 MED ORDER — SODIUM CHLORIDE 0.9 % IV SOLN
INTRAVENOUS | Status: DC
  Administered 2015-12-07 (×2): via INTRAVENOUS

## 2015-12-07 MED ORDER — PHENYLEPHRINE HCL 10 MG/ML IJ SOLN
INTRAMUSCULAR | Status: DC | PRN
  Administered 2015-12-07 (×6): 100 ug via INTRAVENOUS
  Administered 2015-12-07 (×2): 50 ug via INTRAVENOUS
  Administered 2015-12-07 (×4): 100 ug via INTRAVENOUS

## 2015-12-07 MED ORDER — HYDROCODONE-ACETAMINOPHEN 5-325 MG PO TABS
ORAL_TABLET | ORAL | Status: DC
Start: 2015-12-07 — End: 2015-12-07
  Filled 2015-12-07: qty 1

## 2015-12-07 MED ORDER — MIDAZOLAM HCL 2 MG/2ML IJ SOLN
INTRAMUSCULAR | Status: DC | PRN
  Administered 2015-12-07: 2 mg via INTRAVENOUS

## 2015-12-07 MED ORDER — GLYCOPYRROLATE 0.2 MG/ML IJ SOLN
INTRAMUSCULAR | Status: DC | PRN
  Administered 2015-12-07: .2 mg via INTRAVENOUS
  Administered 2015-12-07: 0.2 mg via INTRAVENOUS

## 2015-12-07 MED ORDER — HYDROCODONE-ACETAMINOPHEN 5-325 MG PO TABS: 1.0000 | ORAL_TABLET | ORAL | Status: DC | PRN

## 2015-12-07 MED ORDER — CIPROFLOXACIN-DEXAMETHASONE 0.3-0.1 % OT SUSP
OTIC | Status: AC
Start: 2015-12-07 — End: 2015-12-07
  Filled 2015-12-07: qty 7.5

## 2015-12-07 MED ORDER — LIDOCAINE-EPINEPHRINE (PF) 1 %-1:200000 IJ SOLN
INTRAMUSCULAR | Status: DC | PRN
  Administered 2015-12-07: 5 mL

## 2015-12-07 MED ORDER — IPRATROPIUM-ALBUTEROL 0.5-2.5 (3) MG/3ML IN SOLN
RESPIRATORY_TRACT | Status: AC
  Filled 2015-12-07: qty 3

## 2015-12-07 MED ORDER — FENTANYL CITRATE (PF) 100 MCG/2ML IJ SOLN
INTRAMUSCULAR | Status: DC | PRN
  Administered 2015-12-07: 25 ug via INTRAVENOUS
  Administered 2015-12-07: 50 ug via INTRAVENOUS
  Administered 2015-12-07 (×3): 25 ug via INTRAVENOUS
  Administered 2015-12-07: 100 ug via INTRAVENOUS

## 2015-12-07 MED ORDER — CIPROFLOXACIN-DEXAMETHASONE 0.3-0.1 % OT SUSP
OTIC | Status: DC | PRN
  Administered 2015-12-07: 4 [drp] via OTIC

## 2015-12-07 MED ORDER — LIDOCAINE-EPINEPHRINE (PF) 1 %-1:200000 IJ SOLN
INTRAMUSCULAR | Status: AC
  Filled 2015-12-07: qty 30

## 2015-12-07 MED ORDER — OFLOXACIN 0.3 % OP SOLN: 4.0000 [drp] | Freq: Two times a day (BID) | OPHTHALMIC | Status: DC

## 2015-12-07 MED ORDER — EPINEPHRINE HCL 1 MG/ML IJ SOLN
INTRAMUSCULAR | Status: AC
  Filled 2015-12-07: qty 1

## 2015-12-07 MED ORDER — ROCURONIUM BROMIDE 100 MG/10ML IV SOLN
INTRAVENOUS | Status: DC | PRN
  Administered 2015-12-07: 5 mg via INTRAVENOUS

## 2015-12-07 MED ORDER — REMIFENTANIL HCL 1 MG IV SOLR
INTRAVENOUS | Status: DC | PRN
  Administered 2015-12-07: .2 ug/kg/min via INTRAVENOUS

## 2015-12-07 MED ORDER — SUCCINYLCHOLINE CHLORIDE 20 MG/ML IJ SOLN
INTRAMUSCULAR | Status: DC | PRN
  Administered 2015-12-07: 80 mg via INTRAVENOUS

## 2015-12-07 MED ORDER — GELATIN ABSORBABLE 100 CM EX MISC
CUTANEOUS | Status: AC
  Filled 2015-12-07: qty 1

## 2015-12-07 MED ORDER — BACITRACIN 500 UNIT/GM OP OINT
TOPICAL_OINTMENT | OPHTHALMIC | Status: AC
  Filled 2015-12-07: qty 3.5

## 2015-12-07 MED ORDER — BACITRACIN 500 UNIT/GM EX OINT
TOPICAL_OINTMENT | CUTANEOUS | Status: DC | PRN
  Administered 2015-12-07: 5 via TOPICAL

## 2015-12-07 MED ORDER — BACITRACIN ZINC 500 UNIT/GM EX OINT
TOPICAL_OINTMENT | CUTANEOUS | Status: AC
  Filled 2015-12-07: qty 0.9

## 2015-12-07 MED ORDER — ONDANSETRON HCL 4 MG/2ML IJ SOLN
INTRAMUSCULAR | Status: DC | PRN
  Administered 2015-12-07: 4 mg via INTRAVENOUS

## 2015-12-07 MED ORDER — LIDOCAINE HCL (CARDIAC) 20 MG/ML IV SOLN
INTRAVENOUS | Status: DC | PRN
  Administered 2015-12-07: 40 mg via INTRAVENOUS
  Administered 2015-12-07: 100 mg via INTRAVENOUS

## 2015-12-07 MED ORDER — HYDROCODONE-ACETAMINOPHEN 5-325 MG PO TABS
1.0000 | ORAL_TABLET | ORAL | Status: DC | PRN
  Administered 2015-12-07: 1 via ORAL

## 2015-12-07 MED ORDER — PROPOFOL 10 MG/ML IV BOLUS
INTRAVENOUS | Status: DC | PRN
  Administered 2015-12-07: 40 mg via INTRAVENOUS
  Administered 2015-12-07: 150 mg via INTRAVENOUS
  Administered 2015-12-07 (×2): 10 mg via INTRAVENOUS

## 2015-12-07 MED ORDER — FENTANYL CITRATE (PF) 100 MCG/2ML IJ SOLN
25.0000 ug | INTRAMUSCULAR | Status: DC | PRN
  Administered 2015-12-07 (×2): 25 ug via INTRAVENOUS

## 2015-12-07 MED ORDER — FENTANYL CITRATE (PF) 100 MCG/2ML IJ SOLN
INTRAMUSCULAR | Status: AC
  Administered 2015-12-07: 25 ug via INTRAVENOUS
  Filled 2015-12-07: qty 2

## 2015-12-07 MED ORDER — GELATIN ABSORBABLE 12-7 MM EX MISC
CUTANEOUS | Status: DC | PRN
  Administered 2015-12-07: 2

## 2015-12-07 MED ORDER — IPRATROPIUM-ALBUTEROL 0.5-2.5 (3) MG/3ML IN SOLN
3.0000 mL | RESPIRATORY_TRACT | Status: DC
  Administered 2015-12-07: 3 mL via RESPIRATORY_TRACT

## 2015-12-07 MED ORDER — SODIUM CHLORIDE 0.9 % IV SOLN
INTRAVENOUS | Status: DC | PRN
  Administered 2015-12-07 (×2): via INTRAVENOUS

## 2015-12-07 SURGICAL SUPPLY — 50 items
ADH LQ OCL WTPRF AMP STRL LF (MISCELLANEOUS) ×2
ADHESIVE MASTISOL STRL (MISCELLANEOUS) ×4 IMPLANT
BLADE EAR TYMPAN 2.5 60D BEAV (BLADE) ×2 IMPLANT
BLADE EAR TYMPAN 2.5 STR BEAV (BLADE) ×2 IMPLANT
BLADE SURG 15 STRL LF DISP TIS (BLADE) ×1 IMPLANT
BLADE SURG 15 STRL SS (BLADE) ×2
BUR RND TPR 3.0 ELITE (BURR) ×1 IMPLANT
BUR ROUND FLUTED 5 RND (BURR) ×1 IMPLANT
BUR SABER DIAMOND 3.0 (BURR) ×1 IMPLANT
BUR SABER RD CUTTING 3.0 (BURR) IMPLANT
BUR SABER TAPERED DIAMOND 1 (BURR) IMPLANT
CANISTER SUCT 1200ML W/VALVE (MISCELLANEOUS) ×2 IMPLANT
CATH IV ANGIO 16GX3.25 GREY (CATHETERS) ×1 IMPLANT
COTTON BALL STRL MEDIUM (GAUZE/BANDAGES/DRESSINGS) ×2 IMPLANT
CUP MEDICINE 2OZ PLAST GRAD ST (MISCELLANEOUS) ×2 IMPLANT
DRAIN PENROSE 1/4X12 LTX (DRAIN) ×2 IMPLANT
DRAPE MICROSCOPE ZEISS 48X118 (DRAPES) ×2 IMPLANT
DRAPE SURG 17X11 SM STRL (DRAPES) ×4 IMPLANT
DRAPE SURG IRRIG POUCH 19X23 (DRAPES) ×2 IMPLANT
DRESSING TELFA 4X3 1S ST N-ADH (GAUZE/BANDAGES/DRESSINGS) ×2 IMPLANT
DRSG GLASSCOCK MASTOID ADT (GAUZE/BANDAGES/DRESSINGS) ×2 IMPLANT
ELECT EMG 20MM DUAL (MISCELLANEOUS) ×4
ELECT REM PT RETURN 9FT ADLT (ELECTROSURGICAL) ×2
ELECTRODE EMG 20MM DUAL (MISCELLANEOUS) ×2 IMPLANT
ELECTRODE REM PT RTRN 9FT ADLT (ELECTROSURGICAL) ×1 IMPLANT
GLOVE BIO SURGEON STRL SZ7.5 (GLOVE) ×4 IMPLANT
GOLDENBERG TORP  0.8X7.9 (MISCELLANEOUS) ×1
GOLDENBERG TORP 0.8X7.9 (MISCELLANEOUS) ×1
GOLDENBERG TORP PROSTHESIS 8MM LONG HYDROXYLAPATIT ×1 IMPLANT
GOWN STRL REUS W/ TWL LRG LVL3 (GOWN DISPOSABLE) ×2 IMPLANT
GOWN STRL REUS W/TWL LRG LVL3 (GOWN DISPOSABLE) ×6
IV CATH ANGIO 16GX3.25 GREY (CATHETERS) ×2
IV NS 500ML (IV SOLUTION) ×2
IV NS 500ML BAXH (IV SOLUTION) ×1 IMPLANT
NDL FILTER BLUNT 18X1 1/2 (NEEDLE) ×1 IMPLANT
NEEDLE FILTER BLUNT 18X 1/2SAF (NEEDLE) ×1
NEEDLE FILTER BLUNT 18X1 1/2 (NEEDLE) ×1 IMPLANT
NS IRRIG 500ML POUR BTL (IV SOLUTION) ×2 IMPLANT
PACK HEAD/NECK (MISCELLANEOUS) ×2 IMPLANT
PROSTH OSSI PP TORP 8X0.8 (MISCELLANEOUS) IMPLANT
ROUND DIAMOND BUR (3.0MM) ×1 IMPLANT
SLEEVE PROTECTION STRL DISP (MISCELLANEOUS) ×4 IMPLANT
SUT PLAIN GUT FAST 5-0 (SUTURE) ×2 IMPLANT
SUT VIC AB 4-0 RB1 27 (SUTURE) ×2
SUT VIC AB 4-0 RB1 27X BRD (SUTURE) ×1 IMPLANT
SYR 20CC LL (SYRINGE) ×2 IMPLANT
SYR 3ML LL SCALE MARK (SYRINGE) ×2 IMPLANT
TORP GOLDENBERG 0.8X7.9 (MISCELLANEOUS) ×1 IMPLANT
TUBING IRRIGATION (MISCELLANEOUS) ×2 IMPLANT
WIPE NON LINTING 3.25X3.25 (MISCELLANEOUS) ×2 IMPLANT

## 2015-12-07 NOTE — Anesthesia Preprocedure Evaluation (Signed)
Anesthesia Evaluation  Patient identified by MRN, date of birth, ID band Patient awake    Reviewed: Allergy & Precautions, NPO status , Patient's Chart, lab work & pertinent test results, reviewed documented beta blocker date and time   Airway Mallampati: III  TM Distance: >3 FB     Dental  (+) Chipped   Pulmonary Current Smoker,           Cardiovascular      Neuro/Psych PSYCHIATRIC DISORDERS Anxiety Depression    GI/Hepatic GERD  Controlled,  Endo/Other  diabetes, Type 2  Renal/GU      Musculoskeletal   Abdominal   Peds  Hematology   Anesthesia Other Findings Obese.  Reproductive/Obstetrics                             Anesthesia Physical Anesthesia Plan  ASA: III  Anesthesia Plan: General   Post-op Pain Management:    Induction: Intravenous  Airway Management Planned: Oral ETT  Additional Equipment:   Intra-op Plan:   Post-operative Plan:   Informed Consent: I have reviewed the patients History and Physical, chart, labs and discussed the procedure including the risks, benefits and alternatives for the proposed anesthesia with the patient or authorized representative who has indicated his/her understanding and acceptance.     Plan Discussed with: CRNA  Anesthesia Plan Comments:         Anesthesia Quick Evaluation

## 2015-12-07 NOTE — H&P (Signed)
History and physical reviewed and will be scanned in later. No change in medical status reported by the patient or family, appears stable for surgery. All questions regarding the procedure answered, and patient (or family if a child) expressed understanding of the procedure.  Lauren [name] @TODAY@ 

## 2015-12-07 NOTE — Anesthesia Procedure Notes (Signed)
Procedure Name: Intubation Date/Time: 12/07/2015 7:35 AM Performed by: Allean Found Pre-anesthesia Checklist: Patient identified, Emergency Drugs available, Suction available, Patient being monitored and Timeout performed Patient Re-evaluated:Patient Re-evaluated prior to inductionOxygen Delivery Method: Circle system utilized Preoxygenation: Pre-oxygenation with 100% oxygen Intubation Type: IV induction Ventilation: Mask ventilation without difficulty Laryngoscope Size: Mac and 3 Grade View: Grade I Tube type: Oral Tube size: 7.0 mm Number of attempts: 1 Airway Equipment and Method: Stylet Placement Confirmation: ETT inserted through vocal cords under direct vision,  positive ETCO2 and breath sounds checked- equal and bilateral Secured at: 21 cm Tube secured with: Tape Dental Injury: Teeth and Oropharynx as per pre-operative assessment

## 2015-12-07 NOTE — Op Note (Signed)
12/07/2015  11:08 AM    Lauren [name]  [id number]   Pre-Op Diagnosis:  Right cholesteatoma  Post-op Diagnosis: Same  Procedure: Right canal wall down tympanomastoidectomy with ossicular chain reconstruction utilizing prosthetic TORP, microsurgical dissection utilizing the operating microscope. Facial nerve monitoring (2 hours)  Surgeon:  Riley Nearing  Anesthesia:  General endotracheal anesthesia  EBL:  0000000 cc  Complications:  None  Findings: Cholesteatoma involving the attic and surrounding the patient's previous prosthetic implant. The cholesteatoma extended posteriorly into the mastoid antrum, requiring canal wall down mastoidectomy for complete removal  Procedure: The patient was taken to the Operating Room and placed in the supine position.  After induction of general endotracheal anesthesia, the patient was turned 90 degrees. After a time-out confirming the consented procedure and site, 1% lidocaine with epinephrine 1: 100,000 was injected into the postauricular region and, under visualization with the operating microscope, into the canal skin in the posterior and inferior canal. The facial monitor electrodes were placed in the usual fashion on the forehead and midface, and proper functioning confirmed. The right ear was then prepped and draped in the usual sterile fashion. The ear was evaluated under the operating microscope through an ear speculum, and the canal cleaned and irrigated with saline. Canal incisions were made at 12 and 6:00 vertically, followed by a horizontal incision just behind the annulus.  An incision was made with a 15 blade just behind the post-auricular crease below the hairline. The incision was carried down through the subcutaneous tissues with the Bovie, and dissection carried down to the temporalis fascia. Scissors were used to harvest an adequately sized fascia graft, which was set aside, pressed and dried for later use.   Next a periosteal elevator was  used to elevate the periosteum from the mastoid, exposing widely, utilizing a Soil scientist to dissect down into the canal, raising the posterior canal skin flap until the previously made canal incisions were encountered. The ear was then retracted forward with a Penrose drain and self-retaining retractor. The tympanic membrane and middle ear were inspected. There was a deep retraction pocket in the attic with epithelial debris growing posteriorly towards the antrum and inferiorly around the previously placed artificial prosthesis. The tympanomeatal flap was elevated inferiorly lifting the annulus with a Rosen needle and entering the middle ear. More superiorly dissection could not proceed until bone had been removed to expose the facial recess and antrum more effectively. A #3 cutting bur was used to widen the previously made antrostomy and thin the bone of the posterior canal adjacent to the annulus. Ultimately the decision was made to perform a canal wall down mastoidectomy, as the posterior extent of the cholesteatoma was well into the antrum. Initially the 3 mm cutting bur was used to dissected backwards into the antrum, and later a #5 cutting bur used to more effectively dissected the mastoid, thinning the tegmen superiorly, and thinning the bone over the sigmoid sinus posteriorly. Dissection proceeded back into the sinodural angle with a #3 cutting bur. The sigmoid sinus was relatively anterior. With more dissection completed, the cholesteatoma was then carefully dissected out of the attic. There was still some residual of the incus body left in place from the previous surgery and this was removed. A #3 diamond bur was used to thin bone over the facial ridge and facial recess region until the cholesteatoma could be dissected out of this area, carefully dissecting the old prosthesis from the oval window niche, and removing this. There was  still some soft tissue in the oval window niche and this was carefully  dissected out. This was removed until the footplate of the stapes could be visualized. The footplate appeared to have somewhat limited mobility, but judged to still be mobile to some extent. The head of the malleus was removed with malleus nippers, as there was some disease around the head of the malleus that needed to be cleared. The tensor tympani tendon was clipped to free up the residual malleus. With the cholesteatoma completely removed, attention turned to the mastoid cavity which was further dissected with the diamond bur to lower the facial ridge is low as possible, and lower the superior aspect of the cavity until it was even with tegmen, and dissected further back into the sinodural angle and smooth out all the edges. The ear was then vigorously irrigated with saline to remove bone dust. The acicular chain was reconstructed with a Lyda Kalata TORP prosthesis, [id number]. The shaft was shortened with a 15 blade to provide adequate fit. The previously harvested temporalis fascia graft was trimmed and placed in the middle ear, laid over the facial ridge to seal the middle ear. The TORP was placed into position under the long process of the malleus, seated in the footplate with good contact. A small amount of Ciprodex moistened Gelfoam was packed in the middle ear to provide support around the TORP .The graft and tympanomeatal flap were then placed into anatomic position. There was a previously placed cartilage graft on the tympanomeatal flap which was left in position. There was no evidence of any cholesteatoma growing on the undersurface of this. Ciprodex moistened Gelfoam was packed lateral to the reconstructed tympanic membrane. The postauricular incision was then closed with 4-0 Vicryl suture followed by 5-0 fast absorbing gut for the skin. The posterior canal skin was everted and placed into anatomic position, utilizing some relaxing incisions at 12 and 6:00 to help open the meatus further. The canal was then  further packed with Gelfoam followed by bacitracin ointment. A cotton ball and a Glasscock dressing were then placed. Facial nerve monitoring was utilized during the entire dissection.   The patient was then returned to the anesthesiologist for awakening, and was taken to the Recovery Room in stable condition.  Disposition:   PACU then discharge home  Plan: Remove the ear dressing as instructed, then start Floxin ear drops as prescribed and water precautions.  Antibiotic ointment to the postauricular wound. Follow-up at my office as scheduled.  Riley Nearing 12/07/2015 11:08 AM

## 2015-12-07 NOTE — Transfer of Care (Signed)
Immediate Anesthesia Transfer of Care Note  Patient: Lauren [name]  Procedure(s) Performed: Procedure(s): TYMPANOMASTOIDECTOMY (Right)  Patient Location: PACU  Anesthesia Type:General  Level of Consciousness: sedated  Airway & Oxygen Therapy: Patient Spontanous Breathing and Patient connected to face mask oxygen  Post-op Assessment: Report given to RN and Post -op Vital signs reviewed and stable  Post vital signs: Reviewed and stable  Last Vitals:  Filed Vitals:   12/07/15 0616  BP: 119/96  Pulse: 80  Temp: 36.4 C  Resp: 14    Last Pain: There were no vitals filed for this visit.       Complications: No apparent anesthesia complications

## 2015-12-07 NOTE — Anesthesia Postprocedure Evaluation (Signed)
Anesthesia Post Note  Patient: Lauren [name]  Procedure(s) Performed: Procedure(s) (LRB): TYMPANOMASTOIDECTOMY (Right)  Patient location during evaluation: PACU Anesthesia Type: General Level of consciousness: awake and alert Pain management: pain level controlled Vital Signs Assessment: post-procedure vital signs reviewed and stable Respiratory status: spontaneous breathing, nonlabored ventilation, respiratory function stable and patient connected to nasal cannula oxygen Cardiovascular status: blood pressure returned to baseline and stable Postop Assessment: no signs of nausea or vomiting Anesthetic complications: no    Last Vitals:  Filed Vitals:   12/07/15 1302 12/07/15 1354  BP: 151/77 141/88  Pulse: 81 83  Temp: 36.6 C   Resp: 18 18    Last Pain:  Filed Vitals:   12/07/15 1358  PainSc: [place]

## 2015-12-07 NOTE — Discharge Instructions (Signed)

## 2015-12-08 LAB — SURGICAL PATHOLOGY

## 2015-12-16 ENCOUNTER — Encounter: Payer: Self-pay | Admitting: Otolaryngology

## 2015-12-17 ENCOUNTER — Other Ambulatory Visit: Payer: Self-pay | Admitting: Family Medicine

## 2016-01-10 ENCOUNTER — Other Ambulatory Visit: Payer: Self-pay | Admitting: Family Medicine

## 2016-01-10 DIAGNOSIS — E785 Hyperlipidemia, unspecified: Secondary | ICD-10-CM

## 2016-02-24 ENCOUNTER — Other Ambulatory Visit: Payer: Self-pay | Admitting: Family Medicine

## 2016-02-24 DIAGNOSIS — F329 Major depressive disorder, single episode, unspecified: Secondary | ICD-10-CM

## 2016-02-24 DIAGNOSIS — F419 Anxiety disorder, unspecified: Secondary | ICD-10-CM

## 2016-02-24 DIAGNOSIS — F32A Depression, unspecified: Secondary | ICD-10-CM

## 2016-02-24 DIAGNOSIS — E669 Obesity, unspecified: Principal | ICD-10-CM

## 2016-02-24 DIAGNOSIS — E1169 Type 2 diabetes mellitus with other specified complication: Secondary | ICD-10-CM

## 2016-02-29 ENCOUNTER — Ambulatory Visit: Payer: Managed Care, Other (non HMO) | Admitting: Family Medicine

## 2016-03-16 ENCOUNTER — Encounter: Payer: Self-pay | Admitting: Family Medicine

## 2016-03-16 ENCOUNTER — Ambulatory Visit (INDEPENDENT_AMBULATORY_CARE_PROVIDER_SITE_OTHER): Payer: Managed Care, Other (non HMO) | Admitting: Family Medicine

## 2016-03-16 VITALS — BP 133/71 | HR 83 | Temp 99.0°F | Resp 16 | Ht 62.0 in | Wt 215.4 lb

## 2016-03-16 DIAGNOSIS — E785 Hyperlipidemia, unspecified: Secondary | ICD-10-CM | POA: Diagnosis not present

## 2016-03-16 DIAGNOSIS — J40 Bronchitis, not specified as acute or chronic: Secondary | ICD-10-CM | POA: Insufficient documentation

## 2016-03-16 DIAGNOSIS — E1165 Type 2 diabetes mellitus with hyperglycemia: Secondary | ICD-10-CM | POA: Diagnosis not present

## 2016-03-16 DIAGNOSIS — Z8619 Personal history of other infectious and parasitic diseases: Secondary | ICD-10-CM | POA: Diagnosis not present

## 2016-03-16 DIAGNOSIS — IMO0001 Reserved for inherently not codable concepts without codable children: Secondary | ICD-10-CM

## 2016-03-16 LAB — COMPLETE METABOLIC PANEL WITH GFR
ALT: 59 U/L — ABNORMAL HIGH (ref 6–29)
AST: 44 U/L — ABNORMAL HIGH (ref 10–30)
Albumin: 4.1 g/dL (ref 3.6–5.1)
Alkaline Phosphatase: 66 U/L (ref 33–115)
BUN: 7 mg/dL (ref 7–25)
CO2: 27 mmol/L (ref 20–31)
Calcium: 9 mg/dL (ref 8.6–10.2)
Chloride: 103 mmol/L (ref 98–110)
Creat: 0.65 mg/dL (ref 0.50–1.10)
GFR, Est African American: >89 mL/min (ref >=60)
GFR, Est Non African American: >89 mL/min (ref >=60)
Glucose, Bld: 271 mg/dL — ABNORMAL HIGH (ref 65–99)
Potassium: 4.4 mmol/L (ref 3.5–5.3)
Sodium: 136 mmol/L (ref 135–146)
Total Bilirubin: 0.5 mg/dL (ref 0.2–1.2)
Total Protein: 6.8 g/dL (ref 6.1–8.1)

## 2016-03-16 LAB — LIPID PANEL
Cholesterol: 173 mg/dL (ref 125–200)
HDL: 26 mg/dL — ABNORMAL LOW (ref >=46)
LDL Cholesterol: 116 mg/dL (ref <130)
Total CHOL/HDL Ratio: 6.7 Ratio — ABNORMAL HIGH (ref <=5.0)
Triglycerides: 156 mg/dL — ABNORMAL HIGH (ref <150)
VLDL: 31 mg/dL — ABNORMAL HIGH (ref <30)

## 2016-03-16 LAB — POCT UA - MICROALBUMIN
Albumin/Creatinine Ratio, Urine, POC: NEGATIVE
Creatinine, POC: NEGATIVE mg/dL
Microalbumin Ur, POC: NEGATIVE mg/L

## 2016-03-16 LAB — GLUCOSE, POCT (MANUAL RESULT ENTRY): POC Glucose: 279 mg/dl — AB (ref 70–99)

## 2016-03-16 LAB — POCT GLYCOSYLATED HEMOGLOBIN (HGB A1C): Hemoglobin A1C: 10.8

## 2016-03-16 MED ORDER — DOXYCYCLINE HYCLATE 100 MG PO TABS: 100.0000 mg | ORAL_TABLET | Freq: Two times a day (BID) | ORAL | 0 refills | Status: DC

## 2016-03-16 MED ORDER — EMPAGLIFLOZIN 25 MG PO TABS: 25.0000 mg | ORAL_TABLET | Freq: Every day | ORAL | 2 refills | Status: DC

## 2016-03-16 MED ORDER — EMPAGLIFLOZIN 10 MG PO TABS: 10.0000 mg | ORAL_TABLET | Freq: Every day | ORAL | 0 refills | Status: DC

## 2016-03-16 MED ORDER — VALACYCLOVIR HCL 1 G PO TABS: 1000.0000 mg | ORAL_TABLET | Freq: Every day | ORAL | 3 refills | Status: DC

## 2016-03-16 MED ORDER — SITAGLIPTIN PHOSPHATE 100 MG PO TABS: 100.0000 mg | ORAL_TABLET | Freq: Every day | ORAL | 1 refills | Status: DC

## 2016-03-16 MED ORDER — SIMVASTATIN 40 MG PO TABS: 40.0000 mg | ORAL_TABLET | Freq: Every day | ORAL | 0 refills | Status: DC

## 2016-03-16 NOTE — Progress Notes (Signed)
Name: Lauren [name]   MRN: [id number]    DOB: 09/26/1975   Date:03/16/2016       Progress Note  Subjective  Chief Complaint  Chief Complaint  Patient presents with  . Follow-up    3 mo  . Medication Refill    Diabetes  She presents for her follow-up diabetic visit. She has type 2 diabetes mellitus. Her disease course has been worsening. Associated symptoms include polydipsia. Pertinent negatives for diabetes include no chest pain, no fatigue and no polyuria. Current diabetic treatment includes oral agent (monotherapy). Her breakfast blood glucose range is generally 180-200 mg/dl. An ACE inhibitor/angiotensin II receptor blocker is not being taken.  Hyperlipidemia  This is a chronic problem. The problem is controlled. Recent lipid tests were reviewed and are normal. Pertinent negatives include no chest pain, leg pain, myalgias or shortness of breath. Current antihyperlipidemic treatment includes statins. The current treatment provides significant improvement of lipids. There are no compliance problems.  Risk factors for coronary artery disease include diabetes mellitus, dyslipidemia and obesity.     Past Medical History:  Diagnosis Date  . Allergic rhinitis   . Anxiety   . Chronic depression   . Diabetes mellitus without complication ([place])   . Elevated LFTs   . Genital herpes   . GERD (gastroesophageal reflux disease)   . Hyperlipidemia   . Vitamin D deficiency     Past Surgical History:  Procedure Laterality Date  . APPENDECTOMY    . CESAREAN SECTION     x 4   . COLONOSCOPY    . HERNIA REPAIR  09/25/2011  . TUBAL LIGATION    . TYMPANOMASTOIDECTOMY Right 12/07/2015   Procedure: TYMPANOMASTOIDECTOMY;  Surgeon: Clyde Canterbury, MD;  Location: [facility] ORS;  Service: ENT;  Laterality: Right;    Family History  Problem Relation Age of Onset  . Hypertension Mother   . Hyperlipidemia Mother   . Arrhythmia Father 39    A-fib  . Alcohol abuse Father   . Heart disease Brother 43   stent x 1   . Alcoholism Brother   . Asthma Son   . Asperger's syndrome Son     Social History   Social History  . Marital status: Single    Spouse name: N/A  . Number of children: 4  . Years of education: N/A   Occupational History  .  [employer]   Social History Main Topics  . Smoking status: Current Every Day Smoker    Packs/day: 0.50    Years: 25.00    Types: Cigarettes  . Smokeless tobacco: Never Used  . Alcohol use 0.0 oz/week     Comment: occasionally= 3 beers per month.  . Drug use: No  . Sexual activity: Not on file   Other Topics Concern  . Not on file   Social History Narrative  . No narrative on file     Current Outpatient Prescriptions:  .  albuterol (PROVENTIL HFA) 108 (90 Base) MCG/ACT inhaler, Inhale 2 puffs into the lungs every 6 (six) hours as needed for wheezing or shortness of breath., Disp: 1 Inhaler, Rfl: 3 .  ALPRAZolam (XANAX) 1 MG tablet, Take 1 tablet (1 mg total) by mouth 3 (three) times daily as needed for anxiety., Disp: 90 tablet, Rfl: 2 .  citalopram (CELEXA) 40 MG tablet, TAKE 1 TABLET (40 MG TOTAL) BY MOUTH DAILY., Disp: 90 tablet, Rfl: 0 .  fluticasone (FLONASE) 50 MCG/ACT nasal spray, Place 2 sprays into both nostrils daily., Disp: ,  Rfl: 11 .  ranitidine (ZANTAC) 150 MG tablet, Take 1 tablet (150 mg total) by mouth at bedtime., Disp: 90 tablet, Rfl: 1 .  simvastatin (ZOCOR) 40 MG tablet, TAKE 1 TABLET (40 MG TOTAL) BY MOUTH DAILY AT 6 PM., Disp: 90 tablet, Rfl: 0 .  sitaGLIPtin (JANUVIA) 100 MG tablet, Take 1 tablet (100 mg total) by mouth daily., Disp: 90 tablet, Rfl: 1 .  valACYclovir (VALTREX) 1000 MG tablet, Take 1 tablet (1,000 mg total) by mouth daily., Disp: 90 tablet, Rfl: 3 .  HYDROcodone-acetaminophen (NORCO/VICODIN) 5-325 MG tablet, Take 1-2 tablets by mouth every 4 (four) hours as needed for moderate pain. (Patient not taking: Reported on 03/16/2016), Disp: 40 tablet, Rfl: 0 .  ofloxacin (OCUFLOX) 0.3 % ophthalmic solution, Place 4  drops into the right ear 2 (two) times daily. (Patient not taking: Reported on 03/16/2016), Disp: 5 mL, Rfl: 6  No Known Allergies   Review of Systems  Constitutional: Negative for chills, fatigue and fever.  Respiratory: Positive for cough. Negative for shortness of breath.   Cardiovascular: Negative for chest pain.  Musculoskeletal: Negative for myalgias.  Endo/Heme/Allergies: Positive for polydipsia.    Objective  Vitals:   03/16/16 1007  BP: 133/71  Pulse: 83  Resp: 16  Temp: 99 F (37.2 C)  TempSrc: Oral  SpO2: 96%  Weight: 215 lb 6.4 oz (97.7 kg)  Height: 5\' 2"  (1.575 m)    Physical Exam  Constitutional: She is oriented to person, place, and time and well-developed, well-nourished, and in no distress.  HENT:  Head: Normocephalic and atraumatic.  Cardiovascular: Normal rate, regular rhythm, S1 normal, S2 normal and normal heart sounds.   No murmur heard. Pulmonary/Chest: Effort normal. No respiratory distress. She has decreased breath sounds. She has wheezes.  Abdominal: Soft. Bowel sounds are normal. There is no tenderness.  Musculoskeletal:       Right ankle: She exhibits no swelling.       Left ankle: She exhibits no swelling.  Neurological: She is alert and oriented to person, place, and time.  Psychiatric: Mood, memory, affect and judgment normal.  Nursing note and vitals reviewed.   Assessment & Plan  1. Hyperlipidemia  - Lipid Profile - COMPLETE METABOLIC PANEL WITH GFR - simvastatin (ZOCOR) 40 MG tablet; Take 1 tablet (40 mg total) by mouth daily at 6 PM.  Dispense: 90 tablet; Refill: 0  2. History of herpes genitalis  - valACYclovir (VALTREX) 1000 MG tablet; Take 1 tablet (1,000 mg total) by mouth daily.  Dispense: 90 tablet; Refill: 3  3. Uncontrolled type 2 diabetes mellitus without complication, without long-term current use of insulin (HCC)  A1c is 10.8%, point-of-care urine microalbumin is negative. Diabetes is worsening. We'll add Jardiance  to patient's regimen. Continue on Januvia, encouraged to increase physical activity - empagliflozin (JARDIANCE) 10 MG TABS tablet; Take 10 mg by mouth daily.  Dispense: 7 tablet; Refill: 0 - empagliflozin (JARDIANCE) 25 MG TABS tablet; Take 25 mg by mouth daily.  Dispense: 30 tablet; Refill: 2 - POCT HgB A1C - POCT Glucose (CBG) - POCT UA - Microalbumin - sitaGLIPtin (JANUVIA) 100 MG tablet; Take 1 tablet (100 mg total) by mouth daily.  Dispense: 90 tablet; Refill: 1  4. Bronchitis - doxycycline (VIBRA-TABS) 100 MG tablet; Take 1 tablet (100 mg total) by mouth 2 (two) times daily.  Dispense: 10 tablet; Refill: 0   Asbury Hair [facility] 03/16/2016 10:19 AM

## 2016-03-28 ENCOUNTER — Telehealth: Payer: Self-pay

## 2016-03-28 MED ORDER — DAPAGLIFLOZIN PROPANEDIOL 5 MG PO TABS: 5.0000 mg | ORAL_TABLET | ORAL | 0 refills | Status: DC

## 2016-03-28 NOTE — Telephone Encounter (Signed)
A prescription for Farxiga 5 mg po 9 Am has been sent to [facility] [address] per Dr. Manuella Ghazi

## 2016-05-02 ENCOUNTER — Ambulatory Visit: Payer: Managed Care, Other (non HMO) | Attending: Family Medicine

## 2016-05-09 ENCOUNTER — Other Ambulatory Visit: Payer: Self-pay | Admitting: Family Medicine

## 2016-05-09 DIAGNOSIS — F419 Anxiety disorder, unspecified: Principal | ICD-10-CM

## 2016-05-09 DIAGNOSIS — F329 Major depressive disorder, single episode, unspecified: Secondary | ICD-10-CM

## 2016-05-09 DIAGNOSIS — F32A Depression, unspecified: Secondary | ICD-10-CM

## 2016-05-17 ENCOUNTER — Ambulatory Visit (INDEPENDENT_AMBULATORY_CARE_PROVIDER_SITE_OTHER): Payer: Managed Care, Other (non HMO) | Admitting: Family Medicine

## 2016-05-17 ENCOUNTER — Encounter: Payer: Self-pay | Admitting: Family Medicine

## 2016-05-17 DIAGNOSIS — F339 Major depressive disorder, recurrent, unspecified: Secondary | ICD-10-CM | POA: Insufficient documentation

## 2016-05-17 DIAGNOSIS — F3342 Major depressive disorder, recurrent, in full remission: Secondary | ICD-10-CM | POA: Diagnosis not present

## 2016-05-17 DIAGNOSIS — F419 Anxiety disorder, unspecified: Secondary | ICD-10-CM

## 2016-05-17 DIAGNOSIS — F329 Major depressive disorder, single episode, unspecified: Secondary | ICD-10-CM | POA: Insufficient documentation

## 2016-05-17 MED ORDER — CITALOPRAM HYDROBROMIDE 40 MG PO TABS: 40.0000 mg | ORAL_TABLET | Freq: Every day | ORAL | 0 refills | Status: DC

## 2016-05-17 MED ORDER — ALPRAZOLAM 1 MG PO TABS: 1.0000 mg | ORAL_TABLET | Freq: Three times a day (TID) | ORAL | 2 refills | Status: DC | PRN

## 2016-05-17 NOTE — Progress Notes (Signed)
Name: Lauren [name]   MRN: [id number]    DOB: 1975-09-16   Date:05/17/2016       Progress Note  Subjective  Chief Complaint  Chief Complaint  Patient presents with  . Medication Refill    rantidine, aprazollam and citalopram  . Diabetes    blood glucose in the mornings are usually good but in the evening they are more elevated 180's-190's.    Anxiety  Presents for follow-up visit. Patient reports no depressed mood, excessive worry, insomnia, malaise, nervous/anxious behavior, panic or suicidal ideas. The severity of symptoms is moderate. The quality of sleep is good.   Her past medical history is significant for depression.  Depression       The patient presents with depression.  This is a chronic problem.  The problem has been gradually improving since onset.  Associated symptoms include no helplessness, no hopelessness, does not have insomnia, no decreased interest, no appetite change, not sad and no suicidal ideas.     The symptoms are aggravated by social issues.  Past treatments include SSRIs - Selective serotonin reuptake inhibitors.  Compliance with treatment is good.  Past medical history includes anxiety and depression.     Past Medical History:  Diagnosis Date  . Allergic rhinitis   . Anxiety   . Chronic depression   . Diabetes mellitus without complication ([place])   . Elevated LFTs   . Genital herpes   . GERD (gastroesophageal reflux disease)   . Hyperlipidemia   . Vitamin D deficiency     Past Surgical History:  Procedure Laterality Date  . APPENDECTOMY    . CESAREAN SECTION     x 4   . COLONOSCOPY    . HERNIA REPAIR  09/25/2011  . TUBAL LIGATION    . TYMPANOMASTOIDECTOMY Right 12/07/2015   Procedure: TYMPANOMASTOIDECTOMY;  Surgeon: Clyde Canterbury, MD;  Location: [facility] ORS;  Service: ENT;  Laterality: Right;    Family History  Problem Relation Age of Onset  . Hypertension Mother   . Hyperlipidemia Mother   . Arrhythmia Father 62    A-fib  . Alcohol abuse  Father   . Heart disease Brother 43    stent x 1   . Alcoholism Brother   . Asthma Son   . Asperger's syndrome Son     Social History   Social History  . Marital status: Single    Spouse name: N/A  . Number of children: 4  . Years of education: N/A   Occupational History  .  [employer]   Social History Main Topics  . Smoking status: Current Every Day Smoker    Packs/day: 0.50    Years: 25.00    Types: Cigarettes  . Smokeless tobacco: Never Used  . Alcohol use 0.0 oz/week     Comment: occasionally= 3 beers per month.  . Drug use: No  . Sexual activity: Not on file   Other Topics Concern  . Not on file   Social History Narrative  . No narrative on file     Current Outpatient Prescriptions:  .  albuterol (PROVENTIL HFA) 108 (90 Base) MCG/ACT inhaler, Inhale 2 puffs into the lungs every 6 (six) hours as needed for wheezing or shortness of breath., Disp: 1 Inhaler, Rfl: 3 .  ALPRAZolam (XANAX) 1 MG tablet, Take 1 tablet (1 mg total) by mouth 3 (three) times daily as needed for anxiety., Disp: 90 tablet, Rfl: 2 .  citalopram (CELEXA) 40 MG tablet, TAKE 1 TABLET (40 MG  TOTAL) BY MOUTH DAILY., Disp: 90 tablet, Rfl: 0 .  dapagliflozin propanediol (FARXIGA) 5 MG TABS tablet, Take 5 mg by mouth every morning. 9 A.M, Disp: 90 tablet, Rfl: 0 .  fluticasone (FLONASE) 50 MCG/ACT nasal spray, Place 2 sprays into both nostrils daily., Disp: , Rfl: 11 .  ranitidine (ZANTAC) 150 MG tablet, Take 1 tablet (150 mg total) by mouth at bedtime., Disp: 90 tablet, Rfl: 1 .  simvastatin (ZOCOR) 40 MG tablet, Take 1 tablet (40 mg total) by mouth daily at 6 PM., Disp: 90 tablet, Rfl: 0 .  sitaGLIPtin (JANUVIA) 100 MG tablet, Take 1 tablet (100 mg total) by mouth daily., Disp: 90 tablet, Rfl: 1 .  valACYclovir (VALTREX) 1000 MG tablet, Take 1 tablet (1,000 mg total) by mouth daily., Disp: 90 tablet, Rfl: 3 .  doxycycline (VIBRA-TABS) 100 MG tablet, Take 1 tablet (100 mg total) by mouth 2 (two) times daily.  (Patient not taking: Reported on 05/17/2016), Disp: 10 tablet, Rfl: 0 .  HYDROcodone-acetaminophen (NORCO/VICODIN) 5-325 MG tablet, Take 1-2 tablets by mouth every 4 (four) hours as needed for moderate pain. (Patient not taking: Reported on 05/17/2016), Disp: 40 tablet, Rfl: 0 .  ofloxacin (OCUFLOX) 0.3 % ophthalmic solution, Place 4 drops into the right ear 2 (two) times daily. (Patient not taking: Reported on 05/17/2016), Disp: 5 mL, Rfl: 6  No Known Allergies   Review of Systems  Constitutional: Negative for appetite change.  Psychiatric/Behavioral: Positive for depression. Negative for suicidal ideas. The patient is not nervous/anxious and does not have insomnia.     Objective  Vitals:   05/17/16 1537  BP: 122/62  Pulse: 94  Temp: 98 F (36.7 C)  SpO2: 97%  Weight: 214 lb 14.4 oz (97.5 kg)    Physical Exam  Constitutional: She is oriented to person, place, and time and well-developed, well-nourished, and in no distress.  Cardiovascular: Normal rate, regular rhythm and normal heart sounds.   No murmur heard. Pulmonary/Chest: Effort normal and breath sounds normal. No respiratory distress. She has no wheezes.  Neurological: She is alert and oriented to person, place, and time.  Psychiatric: Mood, memory, affect and judgment normal.  Nursing note and vitals reviewed.     Assessment & Plan  1. Anxiety Stable, responsive to alprazolam taken 3 times a day when needed. - ALPRAZolam (XANAX) 1 MG tablet; Take 1 tablet (1 mg total) by mouth 3 (three) times daily as needed for anxiety.  Dispense: 90 tablet; Refill: 2  2. 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  Dispense: 90 tablet; Refill: 0   Rodert Hinch [facility] 05/17/2016 4:28 PM

## 2016-06-05 ENCOUNTER — Other Ambulatory Visit: Payer: Self-pay | Admitting: Family Medicine

## 2016-06-05 DIAGNOSIS — K219 Gastro-esophageal reflux disease without esophagitis: Secondary | ICD-10-CM

## 2016-06-07 NOTE — Telephone Encounter (Signed)
Medication has been refilled and sent to [facility] [address] 

## 2016-06-22 ENCOUNTER — Ambulatory Visit: Payer: Managed Care, Other (non HMO) | Admitting: Family Medicine

## 2016-06-26 ENCOUNTER — Ambulatory Visit (INDEPENDENT_AMBULATORY_CARE_PROVIDER_SITE_OTHER): Payer: Managed Care, Other (non HMO) | Admitting: Family Medicine

## 2016-06-26 ENCOUNTER — Encounter: Payer: Self-pay | Admitting: Family Medicine

## 2016-06-26 VITALS — BP 120/60 | HR 90 | Temp 99.0°F | Resp 17 | Ht 62.0 in | Wt 214.4 lb

## 2016-06-26 DIAGNOSIS — F3342 Major depressive disorder, recurrent, in full remission: Secondary | ICD-10-CM | POA: Diagnosis not present

## 2016-06-26 DIAGNOSIS — E785 Hyperlipidemia, unspecified: Secondary | ICD-10-CM | POA: Diagnosis not present

## 2016-06-26 DIAGNOSIS — Z23 Encounter for immunization: Secondary | ICD-10-CM

## 2016-06-26 DIAGNOSIS — E1165 Type 2 diabetes mellitus with hyperglycemia: Secondary | ICD-10-CM

## 2016-06-26 DIAGNOSIS — IMO0001 Reserved for inherently not codable concepts without codable children: Secondary | ICD-10-CM

## 2016-06-26 LAB — GLUCOSE, POCT (MANUAL RESULT ENTRY): POC Glucose: 104 mg/dl — AB (ref 70–99)

## 2016-06-26 LAB — POCT GLYCOSYLATED HEMOGLOBIN (HGB A1C): Hemoglobin A1C: 7.4

## 2016-06-26 MED ORDER — SITAGLIPTIN PHOSPHATE 100 MG PO TABS: 100.0000 mg | ORAL_TABLET | Freq: Every day | ORAL | 1 refills | Status: DC

## 2016-06-26 MED ORDER — DAPAGLIFLOZIN PROPANEDIOL 5 MG PO TABS: 5.0000 mg | ORAL_TABLET | ORAL | 0 refills | Status: DC

## 2016-06-26 MED ORDER — CITALOPRAM HYDROBROMIDE 40 MG PO TABS: 40.0000 mg | ORAL_TABLET | Freq: Every day | ORAL | 0 refills | Status: DC

## 2016-06-26 MED ORDER — SIMVASTATIN 40 MG PO TABS: 40.0000 mg | ORAL_TABLET | Freq: Every day | ORAL | 0 refills | Status: DC

## 2016-06-26 NOTE — Progress Notes (Signed)
Name: Lauren [name]   MRN: [id number]    DOB: May 21, 1976   Date:06/26/2016       Progress Note  Subjective  Chief Complaint  Chief Complaint  Patient presents with  . Follow-up    3 mo  . Medication Refill    valtrex    Diabetes  She presents for her follow-up diabetic visit. She has type 2 diabetes mellitus. Her disease course has been worsening. Associated symptoms include polydipsia. Pertinent negatives for diabetes include no chest pain, no fatigue and no polyuria. Current diabetic treatment includes oral agent (monotherapy). Her breakfast blood glucose range is generally 180-200 mg/dl. An ACE inhibitor/angiotensin II receptor blocker is not being taken.  Hyperlipidemia  This is a chronic problem. The problem is controlled. Recent lipid tests were reviewed and are normal. Pertinent negatives include no chest pain, leg pain, myalgias or shortness of breath. Current antihyperlipidemic treatment includes statins. The current treatment provides significant improvement of lipids. There are no compliance problems.  Risk factors for coronary artery disease include diabetes mellitus, dyslipidemia and obesity.     Past Medical History:  Diagnosis Date  . Allergic rhinitis   . Anxiety   . Chronic depression   . Diabetes mellitus without complication ([place])   . Elevated LFTs   . Genital herpes   . GERD (gastroesophageal reflux disease)   . Hyperlipidemia   . Vitamin D deficiency     Past Surgical History:  Procedure Laterality Date  . APPENDECTOMY    . CESAREAN SECTION     x 4   . COLONOSCOPY    . HERNIA REPAIR  09/25/2011  . TUBAL LIGATION    . TYMPANOMASTOIDECTOMY Right 12/07/2015   Procedure: TYMPANOMASTOIDECTOMY;  Surgeon: Clyde Canterbury, MD;  Location: [facility] ORS;  Service: ENT;  Laterality: Right;    Family History  Problem Relation Age of Onset  . Hypertension Mother   . Hyperlipidemia Mother   . Arrhythmia Father 62    A-fib  . Alcohol abuse Father   . Heart disease  Brother 43    stent x 1   . Alcoholism Brother   . Asthma Son   . Asperger's syndrome Son     Social History   Social History  . Marital status: Single    Spouse name: N/A  . Number of children: 4  . Years of education: N/A   Occupational History  .  [employer]   Social History Main Topics  . Smoking status: Current Every Day Smoker    Packs/day: 0.50    Years: 25.00    Types: Cigarettes  . Smokeless tobacco: Never Used  . Alcohol use 0.0 oz/week     Comment: occasionally= 3 beers per month.  . Drug use: No  . Sexual activity: Not on file   Other Topics Concern  . Not on file   Social History Narrative  . No narrative on file     Current Outpatient Prescriptions:  .  albuterol (PROVENTIL HFA) 108 (90 Base) MCG/ACT inhaler, Inhale 2 puffs into the lungs every 6 (six) hours as needed for wheezing or shortness of breath., Disp: 1 Inhaler, Rfl: 3 .  ALPRAZolam (XANAX) 1 MG tablet, Take 1 tablet (1 mg total) by mouth 3 (three) times daily as needed for anxiety., Disp: 90 tablet, Rfl: 2 .  citalopram (CELEXA) 40 MG tablet, Take 1 tablet (40 mg total) by mouth daily., Disp: 90 tablet, Rfl: 0 .  dapagliflozin propanediol (FARXIGA) 5 MG TABS tablet, Take 5  mg by mouth every morning. 9 A.M, Disp: 90 tablet, Rfl: 0 .  fluticasone (FLONASE) 50 MCG/ACT nasal spray, Place 2 sprays into both nostrils daily., Disp: , Rfl: 11 .  ranitidine (ZANTAC) 150 MG tablet, TAKE 1 TABLET (150 MG TOTAL) BY MOUTH AT BEDTIME., Disp: 90 tablet, Rfl: 0 .  simvastatin (ZOCOR) 40 MG tablet, Take 1 tablet (40 mg total) by mouth daily at 6 PM., Disp: 90 tablet, Rfl: 0 .  sitaGLIPtin (JANUVIA) 100 MG tablet, Take 1 tablet (100 mg total) by mouth daily., Disp: 90 tablet, Rfl: 1 .  valACYclovir (VALTREX) 1000 MG tablet, Take 1 tablet (1,000 mg total) by mouth daily., Disp: 90 tablet, Rfl: 3 .  doxycycline (VIBRA-TABS) 100 MG tablet, Take 1 tablet (100 mg total) by mouth 2 (two) times daily. (Patient not taking:  Reported on 06/26/2016), Disp: 10 tablet, Rfl: 0 .  HYDROcodone-acetaminophen (NORCO/VICODIN) 5-325 MG tablet, Take 1-2 tablets by mouth every 4 (four) hours as needed for moderate pain. (Patient not taking: Reported on 06/26/2016), Disp: 40 tablet, Rfl: 0 .  ofloxacin (OCUFLOX) 0.3 % ophthalmic solution, Place 4 drops into the right ear 2 (two) times daily. (Patient not taking: Reported on 06/26/2016), Disp: 5 mL, Rfl: 6  No Known Allergies   Review of Systems  Constitutional: Negative for fatigue.  Respiratory: Negative for shortness of breath.   Cardiovascular: Negative for chest pain.  Musculoskeletal: Negative for myalgias.  Endo/Heme/Allergies: Positive for polydipsia.      Objective  Vitals:   06/26/16 1540  BP: 120/60  Pulse: 90  Resp: 17  Temp: 99 F (37.2 C)  TempSrc: Oral  SpO2: 97%  Weight: 214 lb 6.4 oz (97.3 kg)  Height: 5\' 2"  (1.575 m)    Physical Exam  Constitutional: She is oriented to person, place, and time and well-developed, well-nourished, and in no distress.  HENT:  Head: Normocephalic and atraumatic.  Cardiovascular: Normal rate, regular rhythm and normal heart sounds.  Exam reveals no gallop.   No murmur heard. Pulmonary/Chest: Effort normal and breath sounds normal. She has no decreased breath sounds. She has no wheezes. She has no rhonchi.  Abdominal: Soft. Bowel sounds are normal. There is no tenderness.  Musculoskeletal:       Right ankle: She exhibits no swelling. No tenderness.       Left ankle: She exhibits no swelling. No tenderness.  Neurological: She is alert and oriented to person, place, and time.  Skin: Skin is warm, dry and intact.  Psychiatric: Mood, memory and judgment normal. She has a flat affect.  Nursing note and vitals reviewed.    Assessment & Plan  1. Need for immunization against influenza  - Flu Vaccine QUAD 36+ mos PF IM (Fluarix & Fluzone Quad PF)  2. Uncontrolled type 2 diabetes mellitus without complication,  without long-term current use of insulin (HCC) Point-of-care A1c 7.4%, no change in pharmacotherapy - sitaGLIPtin (JANUVIA) 100 MG tablet; Take 1 tablet (100 mg total) by mouth daily.  Dispense: 90 tablet; Refill: 1 - dapagliflozin propanediol (FARXIGA) 5 MG TABS tablet; Take 5 mg by mouth every morning. 9 A.M  Dispense: 90 tablet; Refill: 0 - POCT HgB A1C - POCT Glucose (CBG)  3. Hyperlipidemia, unspecified hyperlipidemia type  - simvastatin (ZOCOR) 40 MG tablet; Take 1 tablet (40 mg total) by mouth daily at 6 PM.  Dispense: 90 tablet; Refill: 0 - Lipid Profile - COMPLETE METABOLIC PANEL WITH GFR  4. 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 ([place])  -  citalopram (CELEXA) 40 MG tablet; Take 1 tablet (40 mg total) by mouth daily.  Dispense: 90 tablet; Refill: 0   Eliyana Pagliaro [facility] 06/26/2016 4:06 PM

## 2016-09-02 ENCOUNTER — Other Ambulatory Visit: Payer: Self-pay | Admitting: Family Medicine

## 2016-09-02 DIAGNOSIS — K219 Gastro-esophageal reflux disease without esophagitis: Secondary | ICD-10-CM

## 2016-09-23 ENCOUNTER — Other Ambulatory Visit: Payer: Self-pay | Admitting: Family Medicine

## 2016-09-23 DIAGNOSIS — E1165 Type 2 diabetes mellitus with hyperglycemia: Principal | ICD-10-CM

## 2016-09-23 DIAGNOSIS — IMO0001 Reserved for inherently not codable concepts without codable children: Secondary | ICD-10-CM

## 2016-09-25 ENCOUNTER — Ambulatory Visit (INDEPENDENT_AMBULATORY_CARE_PROVIDER_SITE_OTHER): Payer: Managed Care, Other (non HMO) | Admitting: Family Medicine

## 2016-09-25 ENCOUNTER — Encounter: Payer: Self-pay | Admitting: Family Medicine

## 2016-09-25 VITALS — BP 112/72 | HR 80 | Temp 98.2°F | Resp 16 | Ht 62.0 in | Wt 210.2 lb

## 2016-09-25 DIAGNOSIS — F32A Depression, unspecified: Secondary | ICD-10-CM

## 2016-09-25 DIAGNOSIS — E785 Hyperlipidemia, unspecified: Secondary | ICD-10-CM

## 2016-09-25 DIAGNOSIS — E119 Type 2 diabetes mellitus without complications: Secondary | ICD-10-CM | POA: Diagnosis not present

## 2016-09-25 DIAGNOSIS — F418 Other specified anxiety disorders: Secondary | ICD-10-CM

## 2016-09-25 DIAGNOSIS — F419 Anxiety disorder, unspecified: Secondary | ICD-10-CM

## 2016-09-25 DIAGNOSIS — F329 Major depressive disorder, single episode, unspecified: Secondary | ICD-10-CM

## 2016-09-25 LAB — GLUCOSE, POCT (MANUAL RESULT ENTRY): POC Glucose: 122 mg/dl — AB (ref 70–99)

## 2016-09-25 LAB — POCT GLYCOSYLATED HEMOGLOBIN (HGB A1C): Hemoglobin A1C: 6.3

## 2016-09-25 MED ORDER — SIMVASTATIN 40 MG PO TABS: 40.0000 mg | ORAL_TABLET | Freq: Every day | ORAL | 0 refills | Status: DC

## 2016-09-25 MED ORDER — CITALOPRAM HYDROBROMIDE 40 MG PO TABS: 40.0000 mg | ORAL_TABLET | Freq: Every day | ORAL | 0 refills | Status: DC

## 2016-09-25 MED ORDER — DAPAGLIFLOZIN PROPANEDIOL 5 MG PO TABS: 5.0000 mg | ORAL_TABLET | ORAL | 0 refills | Status: DC

## 2016-09-25 MED ORDER — SITAGLIPTIN PHOSPHATE 100 MG PO TABS: 100.0000 mg | ORAL_TABLET | Freq: Every day | ORAL | 1 refills | Status: DC

## 2016-09-25 MED ORDER — ALPRAZOLAM 1 MG PO TABS: 1.0000 mg | ORAL_TABLET | Freq: Two times a day (BID) | ORAL | 2 refills | Status: DC | PRN

## 2016-09-25 NOTE — Progress Notes (Signed)
Name: Lauren [name]   MRN: [id number]    DOB: 10/04/1975   Date:09/25/2016       Progress Note  Subjective  Chief Complaint  Chief Complaint  Patient presents with  . Diabetes    folow up 3 months  . Hyperlipidemia    Diabetes  She presents for her follow-up diabetic visit. She has type 2 diabetes mellitus. Her disease course has been improving. Hypoglycemia symptoms include nervousness/anxiousness. Pertinent negatives for diabetes include no chest pain, no fatigue, no foot paresthesias, no polydipsia and no polyuria. Pertinent negatives for diabetic complications include no CVA or heart disease. Current diabetic treatment includes oral agent (dual therapy). She is following a diabetic and generally healthy diet. Her breakfast blood glucose range is generally 110-130 mg/dl. An ACE inhibitor/angiotensin II receptor blocker is not being taken.  Hyperlipidemia  This is a chronic problem. The problem is uncontrolled. Recent lipid tests were reviewed and are high. Pertinent negatives include no chest pain, leg pain, myalgias or shortness of breath. Current antihyperlipidemic treatment includes statins. The current treatment provides significant improvement of lipids. There are no compliance problems.  Risk factors for coronary artery disease include diabetes mellitus, dyslipidemia and obesity.  Anxiety  Presents for follow-up visit. Symptoms include excessive worry, insomnia, nervous/anxious behavior and panic. Patient reports no chest pain or shortness of breath. The severity of symptoms is moderate.    Patient is now taking alprazolam up to twice daily as needed as compared to 3 times daily as needed in the past, symptoms are well controlled with no side effects.  Past Medical History:  Diagnosis Date  . Allergic rhinitis   . Anxiety   . Chronic depression   . Diabetes mellitus without complication ([place])   . Elevated LFTs   . Genital herpes   . GERD (gastroesophageal reflux disease)   .  Hyperlipidemia   . Vitamin D deficiency     Past Surgical History:  Procedure Laterality Date  . APPENDECTOMY    . CESAREAN SECTION     x 4   . COLONOSCOPY    . HERNIA REPAIR  09/25/2011  . TUBAL LIGATION    . TYMPANOMASTOIDECTOMY Right 12/07/2015   Procedure: TYMPANOMASTOIDECTOMY;  Surgeon: Clyde Canterbury, MD;  Location: [facility] ORS;  Service: ENT;  Laterality: Right;    Family History  Problem Relation Age of Onset  . Hypertension Mother   . Hyperlipidemia Mother   . Arrhythmia Father 41    A-fib  . Alcohol abuse Father   . Heart disease Brother 43    stent x 1   . Alcoholism Brother   . Asthma Son   . Asperger's syndrome Son     Social History   Social History  . Marital status: Single    Spouse name: N/A  . Number of children: 4  . Years of education: N/A   Occupational History  .  [employer]   Social History Main Topics  . Smoking status: Current Every Day Smoker    Packs/day: 0.50    Years: 25.00    Types: Cigarettes  . Smokeless tobacco: Never Used  . Alcohol use 0.0 oz/week     Comment: occasionally= 3 beers per month.  . Drug use: No  . Sexual activity: Not on file   Other Topics Concern  . Not on file   Social History Narrative  . No narrative on file     Current Outpatient Prescriptions:  .  albuterol (PROVENTIL HFA) 108 (90 Base) MCG/ACT  inhaler, Inhale 2 puffs into the lungs every 6 (six) hours as needed for wheezing or shortness of breath., Disp: 1 Inhaler, Rfl: 3 .  ALPRAZolam (XANAX) 1 MG tablet, Take 1 tablet (1 mg total) by mouth 3 (three) times daily as needed for anxiety., Disp: 90 tablet, Rfl: 2 .  citalopram (CELEXA) 40 MG tablet, Take 1 tablet (40 mg total) by mouth daily., Disp: 90 tablet, Rfl: 0 .  dapagliflozin propanediol (FARXIGA) 5 MG TABS tablet, Take 5 mg by mouth every morning. 9 A.M, Disp: 90 tablet, Rfl: 0 .  fluticasone (FLONASE) 50 MCG/ACT nasal spray, Place 2 sprays into both nostrils daily., Disp: , Rfl: 11 .  ranitidine  (ZANTAC) 150 MG tablet, TAKE 1 TABLET (150 MG TOTAL) BY MOUTH AT BEDTIME., Disp: 90 tablet, Rfl: 0 .  simvastatin (ZOCOR) 40 MG tablet, Take 1 tablet (40 mg total) by mouth daily at 6 PM., Disp: 90 tablet, Rfl: 0 .  sitaGLIPtin (JANUVIA) 100 MG tablet, Take 1 tablet (100 mg total) by mouth daily., Disp: 90 tablet, Rfl: 1 .  valACYclovir (VALTREX) 1000 MG tablet, Take 1 tablet (1,000 mg total) by mouth daily., Disp: 90 tablet, Rfl: 3  No Known Allergies   Review of Systems  Constitutional: Negative for fatigue.  Respiratory: Negative for shortness of breath.   Cardiovascular: Negative for chest pain.  Musculoskeletal: Negative for myalgias.  Endo/Heme/Allergies: Negative for polydipsia.  Psychiatric/Behavioral: The patient is nervous/anxious and has insomnia.      Objective  Vitals:   09/25/16 0848  BP: 112/72  Pulse: 80  Resp: 16  Temp: 98.2 F (36.8 C)  TempSrc: Oral  SpO2: 98%  Weight: 210 lb 3.2 oz (95.3 kg)  Height: 5\' 2"  (1.575 m)    Physical Exam  Constitutional: She is oriented to person, place, and time and well-developed, well-nourished, and in no distress.  HENT:  Head: Normocephalic and atraumatic.  Cardiovascular: Normal rate, regular rhythm and normal heart sounds.  Exam reveals no gallop.   No murmur heard. Pulmonary/Chest: Effort normal and breath sounds normal. She has no decreased breath sounds. She has no wheezes. She has no rhonchi.  Abdominal: Soft. Bowel sounds are normal. There is no tenderness.  Musculoskeletal:       Right ankle: She exhibits no swelling. No tenderness.       Left ankle: She exhibits no swelling. No tenderness.  Neurological: She is alert and oriented to person, place, and time.  Skin: Skin is warm, dry and intact.  Psychiatric: Mood, memory, affect and judgment normal.  Nursing note and vitals reviewed.    Recent Results (from the past 2160 hour(s))  POCT Glucose (CBG)     Status: Abnormal   Collection Time: 09/25/16  8:51  AM  Result Value Ref Range   POC Glucose 122 (A) 70 - 99 mg/dl  POCT HgB A1C     Status: None   Collection Time: 09/25/16  8:53 AM  Result Value Ref Range   Hemoglobin A1C 6.3      Assessment & Plan  1. Type 2 diabetes mellitus without complication, without long-term current use of insulin (HCC) Point-of-care A1c 6.2%, well-controlled diabetes, continue on dual SGLT2 inhibitor and DPP 4 inhibitor therapy - POCT HgB A1C - POCT Glucose (CBG) - dapagliflozin propanediol (FARXIGA) 5 MG TABS tablet; Take 5 mg by mouth every morning. 9 A.M  Dispense: 90 tablet; Refill: 0 - sitaGLIPtin (JANUVIA) 100 MG tablet; Take 1 tablet (100 mg total) by mouth daily.  Dispense: 90 tablet; Refill: 1  2. Hyperlipidemia, unspecified hyperlipidemia type  - simvastatin (ZOCOR) 40 MG tablet; Take 1 tablet (40 mg total) by mouth daily at 6 PM.  Dispense: 90 tablet; Refill: 0 - Lipid panel - COMPLETE METABOLIC PANEL WITH GFR  3. Anxiety and depression Symptoms of anxiety and depression are stable on citalopram and alprazolam taken as prescribed, refills provided and follow-up in 3 months - citalopram (CELEXA) 40 MG tablet; Take 1 tablet (40 mg total) by mouth daily.  Dispense: 90 tablet; Refill: 0 - ALPRAZolam (XANAX) 1 MG tablet; Take 1 tablet (1 mg total) by mouth 2 (two) times daily as needed for anxiety.  Dispense: 60 tablet; Refill: 2    Cordai Rodrigue [facility] 09/25/2016 9:07 AM

## 2016-10-14 ENCOUNTER — Encounter: Payer: Self-pay | Admitting: Emergency Medicine

## 2016-10-14 ENCOUNTER — Emergency Department
Admission: EM | Admit: 2016-10-14 | Discharge: 2016-10-14 | Disposition: A | Discharge status: Home / Self Care | Payer: Managed Care, Other (non HMO) | Attending: Emergency Medicine | Admitting: Emergency Medicine

## 2016-10-14 DIAGNOSIS — Y929 Unspecified place or not applicable: Secondary | ICD-10-CM | POA: Diagnosis not present

## 2016-10-14 DIAGNOSIS — Y999 Unspecified external cause status: Secondary | ICD-10-CM | POA: Insufficient documentation

## 2016-10-14 DIAGNOSIS — E119 Type 2 diabetes mellitus without complications: Secondary | ICD-10-CM | POA: Diagnosis not present

## 2016-10-14 DIAGNOSIS — M79662 Pain in left lower leg: Secondary | ICD-10-CM

## 2016-10-14 DIAGNOSIS — W11XXXA Fall on and from ladder, initial encounter: Secondary | ICD-10-CM | POA: Insufficient documentation

## 2016-10-14 DIAGNOSIS — S8992XA Unspecified injury of left lower leg, initial encounter: Secondary | ICD-10-CM | POA: Diagnosis present

## 2016-10-14 DIAGNOSIS — F17210 Nicotine dependence, cigarettes, uncomplicated: Secondary | ICD-10-CM | POA: Insufficient documentation

## 2016-10-14 DIAGNOSIS — Y939 Activity, unspecified: Secondary | ICD-10-CM | POA: Insufficient documentation

## 2016-10-14 DIAGNOSIS — S86812A Strain of other muscle(s) and tendon(s) at lower leg level, left leg, initial encounter: Secondary | ICD-10-CM

## 2016-10-14 DIAGNOSIS — Z7984 Long term (current) use of oral hypoglycemic drugs: Secondary | ICD-10-CM | POA: Insufficient documentation

## 2016-10-14 MED ORDER — CYCLOBENZAPRINE HCL 10 MG PO TABS
10.0000 mg | ORAL_TABLET | Freq: Once | ORAL | Status: AC
  Administered 2016-10-14: 10 mg via ORAL
  Filled 2016-10-14: qty 1

## 2016-10-14 MED ORDER — CYCLOBENZAPRINE HCL 5 MG PO TABS
5.0000 mg | ORAL_TABLET | Freq: Three times a day (TID) | ORAL | 0 refills | Status: DC | PRN
Start: 2016-10-14 — End: 2017-04-23

## 2016-10-14 MED ORDER — NAPROXEN 500 MG PO TABS
500.0000 mg | ORAL_TABLET | Freq: Once | ORAL | Status: AC
  Administered 2016-10-14: 500 mg via ORAL
  Filled 2016-10-14: qty 1

## 2016-10-14 MED ORDER — NABUMETONE 750 MG PO TABS: 750.0000 mg | ORAL_TABLET | Freq: Two times a day (BID) | ORAL | 0 refills | Status: DC

## 2016-10-14 NOTE — Discharge Instructions (Signed)
You have sustained a strain of the calf muscle. This injury cause pain with walking. You should take the prescription muscle relaxant as needed and the anti-inflammatory as directed. Apply ice to reduce pain and swelling. You may develop bruising at or below the injury after a few days. Follow-up with Dr. Mack Guise or your provider for continued or worsening symptoms. Use the crutches to walk and bear weight as tolerated.

## 2016-10-14 NOTE — ED Triage Notes (Addendum)
Pt states she stepped back off a ladder today and c/o L calf pain. Pt states when she stepped she felt a shooting pain, pt states pain worse when she walks on it.

## 2016-10-18 ENCOUNTER — Ambulatory Visit (INDEPENDENT_AMBULATORY_CARE_PROVIDER_SITE_OTHER): Payer: Managed Care, Other (non HMO) | Admitting: Family Medicine

## 2016-10-18 ENCOUNTER — Encounter: Payer: Self-pay | Admitting: Family Medicine

## 2016-10-18 VITALS — BP 132/72 | HR 86 | Temp 98.8°F | Resp 17 | Ht 63.0 in | Wt 212.5 lb

## 2016-10-18 DIAGNOSIS — Z1231 Encounter for screening mammogram for malignant neoplasm of breast: Secondary | ICD-10-CM | POA: Diagnosis not present

## 2016-10-18 DIAGNOSIS — Z0000 Encounter for general adult medical examination without abnormal findings: Secondary | ICD-10-CM | POA: Diagnosis not present

## 2016-10-18 DIAGNOSIS — Z720 Tobacco use: Secondary | ICD-10-CM

## 2016-10-18 DIAGNOSIS — Z1239 Encounter for other screening for malignant neoplasm of breast: Secondary | ICD-10-CM

## 2016-10-18 LAB — CBC WITH DIFFERENTIAL/PLATELET
Basophils Absolute: 0 cells/uL (ref 0–200)
Basophils Relative: 0 %
Eosinophils Absolute: 69 cells/uL (ref 15–500)
Eosinophils Relative: 1 %
HCT: 41.2 % (ref 35.0–45.0)
Hemoglobin: 13.1 g/dL (ref 11.7–15.5)
Lymphocytes Relative: 33 %
Lymphs Abs: 2277 cells/uL (ref 850–3900)
MCH: 29.3 pg (ref 27.0–33.0)
MCHC: 31.8 g/dL — ABNORMAL LOW (ref 32.0–36.0)
MCV: 92.2 fL (ref 80.0–100.0)
MPV: 10.2 fL (ref 7.5–12.5)
Monocytes Absolute: 414 cells/uL (ref 200–950)
Monocytes Relative: 6 %
Neutro Abs: 4140 cells/uL (ref 1500–7800)
Neutrophils Relative %: 60 %
Platelets: 212 10*3/uL (ref 140–400)
RBC: 4.47 MIL/uL (ref 3.80–5.10)
RDW: 15.3 % — ABNORMAL HIGH (ref 11.0–15.0)
WBC: 6.9 10*3/uL (ref 3.8–10.8)

## 2016-10-18 LAB — TSH: TSH: 1.21 mIU/L

## 2016-10-18 NOTE — Progress Notes (Signed)
Name: Lauren [name]   MRN: [id number]    DOB: 07/12/1975   Date:10/18/2016       Progress Note  Subjective  Chief Complaint  Chief Complaint  Patient presents with  . Annual Exam    CPE    Nicotine Dependence  Presents for initial visit. Symptoms include cravings. Symptoms are negative for insomnia and sore throat. Preferred tobacco types include cigarettes. Preferred cigarette types include filtered. Preferred strength is regular. Preferred cigarettes are menthol. Preferred brands include [place]. Her urge triggers include company of smokers, driving, meal time and stress. Her first smoke is from 8 to 10 AM. She smokes 1 pack of cigarettes per day. She started smoking when she was 41-51 years old. Past treatments include varenicline. The treatment provided significant (chantix worked initially but had to be discontinued because of hallucinations. ) relief. Aracelis is thinking about quitting.    Patient presents for Complete Physical Exam. She is due for screening mammogram, last mammogram in December 2015 was BI-RADS Cat. 1 Pap smear in April 2017 was negative, due in April 2020   Past Medical History:  Diagnosis Date  . Allergic rhinitis   . Anxiety   . Chronic depression   . Diabetes mellitus without complication ([place])   . Elevated LFTs   . Genital herpes   . GERD (gastroesophageal reflux disease)   . Hyperlipidemia   . Vitamin D deficiency     Past Surgical History:  Procedure Laterality Date  . APPENDECTOMY    . CESAREAN SECTION     x 4   . COLONOSCOPY    . HERNIA REPAIR  09/25/2011  . TUBAL LIGATION    . TYMPANOMASTOIDECTOMY Right 12/07/2015   Procedure: TYMPANOMASTOIDECTOMY;  Surgeon: Clyde Canterbury, MD;  Location: [facility] ORS;  Service: ENT;  Laterality: Right;    Family History  Problem Relation Age of Onset  . Hypertension Mother   . Hyperlipidemia Mother   . Arrhythmia Father 11    A-fib  . Alcohol abuse Father   . Heart disease Brother 43    stent x 1   .  Alcoholism Brother   . Asthma Son   . Asperger's syndrome Son     Social History   Social History  . Marital status: Single    Spouse name: N/A  . Number of children: 4  . Years of education: N/A   Occupational History  .  [employer]   Social History Main Topics  . Smoking status: Current Every Day Smoker    Packs/day: 0.50    Years: 25.00    Types: Cigarettes  . Smokeless tobacco: Never Used  . Alcohol use 0.0 oz/week     Comment: occasionally= 3 beers per month.  . Drug use: No  . Sexual activity: Not on file   Other Topics Concern  . Not on file   Social History Narrative  . No narrative on file     Current Outpatient Prescriptions:  .  albuterol (PROVENTIL HFA) 108 (90 Base) MCG/ACT inhaler, Inhale 2 puffs into the lungs every 6 (six) hours as needed for wheezing or shortness of breath., Disp: 1 Inhaler, Rfl: 3 .  ALPRAZolam (XANAX) 1 MG tablet, Take 1 tablet (1 mg total) by mouth 2 (two) times daily as needed for anxiety., Disp: 60 tablet, Rfl: 2 .  citalopram (CELEXA) 40 MG tablet, Take 1 tablet (40 mg total) by mouth daily., Disp: 90 tablet, Rfl: 0 .  cyclobenzaprine (FLEXERIL) 5 MG tablet, Take 1 tablet (  5 mg total) by mouth 3 (three) times daily as needed for muscle spasms., Disp: 15 tablet, Rfl: 0 .  dapagliflozin propanediol (FARXIGA) 5 MG TABS tablet, Take 5 mg by mouth every morning. 9 A.M, Disp: 90 tablet, Rfl: 0 .  fluticasone (FLONASE) 50 MCG/ACT nasal spray, Place 2 sprays into both nostrils daily., Disp: , Rfl: 11 .  nabumetone (RELAFEN) 750 MG tablet, Take 1 tablet (750 mg total) by mouth 2 (two) times daily., Disp: 30 tablet, Rfl: 0 .  ranitidine (ZANTAC) 150 MG tablet, TAKE 1 TABLET (150 MG TOTAL) BY MOUTH AT BEDTIME., Disp: 90 tablet, Rfl: 0 .  simvastatin (ZOCOR) 40 MG tablet, Take 1 tablet (40 mg total) by mouth daily at 6 PM., Disp: 90 tablet, Rfl: 0 .  sitaGLIPtin (JANUVIA) 100 MG tablet, Take 1 tablet (100 mg total) by mouth daily., Disp: 90 tablet,  Rfl: 1 .  valACYclovir (VALTREX) 1000 MG tablet, Take 1 tablet (1,000 mg total) by mouth daily., Disp: 90 tablet, Rfl: 3  No Known Allergies   Review of Systems  Constitutional: Negative for chills, fever and malaise/fatigue.  HENT: Negative for congestion, ear pain and sore throat.   Eyes: Negative for blurred vision and double vision.  Respiratory: Negative for cough and shortness of breath.   Cardiovascular: Positive for leg swelling. Negative for chest pain and palpitations.  Gastrointestinal: Negative for abdominal pain, blood in stool, nausea and vomiting.  Genitourinary: Negative for dysuria and hematuria.  Musculoskeletal: Negative for back pain and neck pain.  Neurological: Negative for dizziness and headaches.  Psychiatric/Behavioral: Positive for depression. The patient is nervous/anxious. The patient does not have insomnia.       Objective  Vitals:   10/18/16 1002  BP: 132/72  Pulse: 86  Resp: 17  Temp: 98.8 F (37.1 C)  TempSrc: Oral  SpO2: 97%  Weight: 212 lb 8 oz (96.4 kg)  Height: 5\' 3"  (1.6 m)    Physical Exam  Constitutional: She is oriented to person, place, and time and well-developed, well-nourished, and in no distress.  HENT:  Head: Normocephalic and atraumatic.  Right Ear: External ear normal.  Left Ear: External ear normal.  Mouth/Throat: Oropharynx is clear and moist. No oropharyngeal exudate.  Eyes: Pupils are equal, round, and reactive to light.  Neck: Neck supple. No thyromegaly present.  Cardiovascular: Normal rate, regular rhythm and normal heart sounds.   No murmur heard. Pulmonary/Chest: Effort normal and breath sounds normal. She has no wheezes.  Abdominal: Soft. Bowel sounds are normal. There is no tenderness.  Musculoskeletal: She exhibits no edema.  Neurological: She is alert and oriented to person, place, and time.  Skin: Skin is warm and dry.  Psychiatric: Mood, memory, affect and judgment normal.  Nursing note and vitals  reviewed.     Assessment & Plan  1. Well woman exam without gynecological exam Obtain age-appropriate laboratory screening - CBC with Differential/Platelet - TSH - VITAMIN D 25 Hydroxy (Vit-D Deficiency, Fractures)  2. Screening for breast cancer  - MM Digital Screening; Future  3. Tobacco abuse Patient has tried Chantix before which was not tolerated because of side effects. She is not a candidate for Zyban as well because of concurrent use of SSRI, have provided the option of nicotine patches and patient will think about and make a decision   Izabela Ow [facility] 10/18/2016 10:11 AM

## 2016-10-18 NOTE — ED Provider Notes (Signed)
[facility] Emergency Department Provider Note ____________________________________________  Time seen: 2010  I have reviewed the triage vital signs and the nursing notes.  HISTORY  Chief Complaint  Leg Pain  HPI Lauren [name] is a 41 y.o. female to the ED for evaluation of pain to the left calf and she stepped backward off of a ladder today. She describes a shooting pain that she felt to the posterior leg that is worse when she attempts to walk. She denies any outright fall, ankle sprain, or other injury. She denies any swelling, or bruising to the calf. She also denies any shortness of breath, chest pain, or anxiety.  Past Medical History:  Diagnosis Date  . Allergic rhinitis   . Anxiety   . Chronic depression   . Diabetes mellitus without complication ([place])   . Elevated LFTs   . Genital herpes   . GERD (gastroesophageal reflux disease)   . Hyperlipidemia   . Vitamin D deficiency     Patient Active Problem List   Diagnosis Date Noted  . 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 Bronchitis 03/16/2016  . Cholesteatoma of attic of right ear 12/05/2015  . Nocturnal cough 11/29/2015  . GERD (gastroesophageal reflux disease) 11/29/2015  . Annual physical exam 09/19/2015  . Acid reflux 09/19/2015  . Screening for breast cancer 09/19/2015  . Chronic pelvic pain in female 09/19/2015  . Need for immunization against influenza 03/15/2015  . Vitamin D deficiency 12/13/2014  . Anxiety and depression 12/13/2014  . History of herpes genitalis 12/13/2014  . Diabetes mellitus type 2, uncontrolled, without complications ([place]) 29/92/4268  . Hyperlipidemia 05/26/2014  . Obesity 05/26/2014  . Smoker 05/26/2014    Past Surgical History:  Procedure Laterality Date  . APPENDECTOMY    . CESAREAN SECTION     x 4   . COLONOSCOPY    . HERNIA REPAIR  09/25/2011  . TUBAL LIGATION    . TYMPANOMASTOIDECTOMY Right 12/07/2015   Procedure:  TYMPANOMASTOIDECTOMY;  Surgeon: Clyde Canterbury, MD;  Location: [facility] ORS;  Service: ENT;  Laterality: Right;    Prior to Admission medications   Medication Sig Start Date End Date Taking? Authorizing Provider  albuterol (PROVENTIL HFA) 108 (90 Base) MCG/ACT inhaler Inhale 2 puffs into the lungs every 6 (six) hours as needed for wheezing or shortness of breath. 11/29/15   Roselee Nova, MD  ALPRAZolam Duanne Moron) 1 MG tablet Take 1 tablet (1 mg total) by mouth 2 (two) times daily as needed for anxiety. 09/25/16   Roselee Nova, MD  citalopram (CELEXA) 40 MG tablet Take 1 tablet (40 mg total) by mouth daily. 09/25/16   Roselee Nova, MD  cyclobenzaprine (FLEXERIL) 5 MG tablet Take 1 tablet (5 mg total) by mouth 3 (three) times daily as needed for muscle spasms. 10/14/16   Daena Alper V Bacon Darrel Gloss, PA-C  dapagliflozin propanediol (FARXIGA) 5 MG TABS tablet Take 5 mg by mouth every morning. 9 A.M 09/25/16   Roselee Nova, MD  fluticasone [facility]) 50 MCG/ACT nasal spray Place 2 sprays into both nostrils daily. 10/13/15   Historical Provider, MD  nabumetone (RELAFEN) 750 MG tablet Take 1 tablet (750 mg total) by mouth 2 (two) times daily. 10/14/16   Renesmee Raine V Bacon Daveigh Batty, PA-C  ranitidine (ZANTAC) 150 MG tablet TAKE 1 TABLET (150 MG TOTAL) BY MOUTH AT BEDTIME. 09/03/16   Roselee Nova, MD  simvastatin (ZOCOR) 40 MG tablet Take 1  tablet (40 mg total) by mouth daily at 6 PM. 09/25/16   Roselee Nova, MD  sitaGLIPtin (JANUVIA) 100 MG tablet Take 1 tablet (100 mg total) by mouth daily. 09/25/16   Roselee Nova, MD  valACYclovir (VALTREX) 1000 MG tablet Take 1 tablet (1,000 mg total) by mouth daily. 03/16/16   Roselee Nova, MD    Allergies Patient has no known allergies.  Family History  Problem Relation Age of Onset  . Hypertension Mother   . Hyperlipidemia Mother   . Arrhythmia Father 28    A-fib  . Alcohol abuse Father   . Heart disease Brother 43    stent x 1   . Alcoholism Brother   .  Asthma Son   . Asperger's syndrome Son     Social History Social History  Substance Use Topics  . Smoking status: Current Every Day Smoker    Packs/day: 0.50    Years: 25.00    Types: Cigarettes  . Smokeless tobacco: Never Used  . Alcohol use 0.0 oz/week     Comment: occasionally= 3 beers per month.    Review of Systems  Constitutional: Negative for fever. Cardiovascular: Negative for chest pain. Respiratory: Negative for shortness of breath. Gastrointestinal: Negative for abdominal pain, vomiting and diarrhea. Musculoskeletal: Negative for back pain. Left calf pain as above. Skin: Negative for rash. Neurological: Negative for headaches, focal weakness or numbness. ____________________________________________  PHYSICAL EXAM:  VITAL SIGNS: ED Triage Vitals  Enc Vitals Group     BP 10/14/16 1856 (!) 138/59     Pulse Rate 10/14/16 1856 73     Resp 10/14/16 1856 18     Temp 10/14/16 1856 99 F (37.2 C)     Temp Source 10/14/16 1856 Oral     SpO2 10/14/16 1856 98 %     Weight 10/14/16 1855 214 lb (97.1 kg)     Height 10/14/16 1855 5\' 3"  (1.6 m)     Head Circumference --      Peak Flow --      Pain Score 10/14/16 1855 10     Pain Loc --      Pain Edu? --      Excl. in [place]? --     Constitutional: Alert and oriented. Well appearing and in no distress. Head: Normocephalic and atraumatic. Cardiovascular: Normal rate, regular rhythm. Normal distal pulses. Respiratory: Normal respiratory effort. No wheezes/rales/rhonchi. Musculoskeletal: left Without any obvious deformity, ecchymosis, edema, or dysfunction. Patient with a negative Thompson test. She is tender palpation to the muscle belly of the left calf. No muscle deformity or chords are palpated. The Achilles is intact and ankle exam is normal. Knee exam is also without any signs of internal derangement.Nontender with normal range of motion in all extremities.  Neurologic:  Antalgic gait without ataxia. Normal speech and  language. No gross focal neurologic deficits are appreciated. Skin:  Skin is warm, dry and intact. No rash noted. ____________________________________________  PROCEDURES  Flexeril 10 mg PO Relafen 750 mg PO Crutches ____________________________________________  INITIAL IMPRESSION / ASSESSMENT AND PLAN / ED COURSE  Patient with an acute left calf strain on presentation. Her exams this with the gastroc muscle strain without signs of any Achilles involvement. Patient is discharged with a prescription for Flexeril and Relafen the dose as directed. She also provided with crutches to ambulate with weightbearing as tolerated. She will follow-up with orthophoric ongoing symptom management. She is also provided with a work note for one  day as requested. ____________________________________________  FINAL CLINICAL IMPRESSION(S) / ED DIAGNOSES  Final diagnoses:  Pain of left calf  Strain of calf muscle, left, initial encounter      Melvenia Needles, PA-C 10/18/16 [place], MD 10/18/16 [id number]

## 2016-10-19 ENCOUNTER — Telehealth: Payer: Self-pay

## 2016-10-19 LAB — VITAMIN D 25 HYDROXY (VIT D DEFICIENCY, FRACTURES): Vit D, 25-Hydroxy: 24 ng/mL — ABNORMAL LOW (ref 30–100)

## 2016-10-19 MED ORDER — VITAMIN D (ERGOCALCIFEROL) 1.25 MG (50000 UNIT) PO CAPS: 50000.0000 [IU] | ORAL_CAPSULE | ORAL | 0 refills | Status: DC

## 2016-10-19 NOTE — Telephone Encounter (Signed)
Patient has been notified of lab results and a prescription for vitamin D3 50,000 units take 1 capsule once a week x12 weeks has been sent to [facility] [address] per Dr. Manuella Ghazi, patient has been notified and verbalized understanding

## 2016-12-06 ENCOUNTER — Other Ambulatory Visit: Payer: Self-pay | Admitting: Family Medicine

## 2016-12-06 DIAGNOSIS — K219 Gastro-esophageal reflux disease without esophagitis: Secondary | ICD-10-CM

## 2016-12-18 ENCOUNTER — Ambulatory Visit: Payer: Managed Care, Other (non HMO) | Admitting: Family Medicine

## 2016-12-26 ENCOUNTER — Other Ambulatory Visit: Payer: Self-pay | Admitting: Family Medicine

## 2016-12-26 DIAGNOSIS — E119 Type 2 diabetes mellitus without complications: Secondary | ICD-10-CM

## 2017-01-18 ENCOUNTER — Telehealth: Payer: Self-pay | Admitting: Family Medicine

## 2017-01-18 DIAGNOSIS — E119 Type 2 diabetes mellitus without complications: Secondary | ICD-10-CM

## 2017-01-18 NOTE — Telephone Encounter (Signed)
Pt has appt for when you return. She is completely out of [place]. Please send a script to [facility]

## 2017-01-18 NOTE — Telephone Encounter (Signed)
Pt has been notified to call pharmacy because she still has 1 remaining refill on Januvia @ [facility] Lauren [name]

## 2017-01-30 ENCOUNTER — Ambulatory Visit: Payer: Managed Care, Other (non HMO) | Admitting: Family Medicine

## 2017-01-31 ENCOUNTER — Ambulatory Visit (INDEPENDENT_AMBULATORY_CARE_PROVIDER_SITE_OTHER): Payer: Managed Care, Other (non HMO) | Admitting: Family Medicine

## 2017-01-31 ENCOUNTER — Encounter: Payer: Self-pay | Admitting: Family Medicine

## 2017-01-31 VITALS — BP 136/76 | HR 77 | Temp 98.3°F | Resp 16 | Ht 63.0 in | Wt 212.5 lb

## 2017-01-31 DIAGNOSIS — J3489 Other specified disorders of nose and nasal sinuses: Secondary | ICD-10-CM | POA: Diagnosis not present

## 2017-01-31 DIAGNOSIS — F419 Anxiety disorder, unspecified: Secondary | ICD-10-CM

## 2017-01-31 DIAGNOSIS — F32A Depression, unspecified: Secondary | ICD-10-CM

## 2017-01-31 DIAGNOSIS — E119 Type 2 diabetes mellitus without complications: Secondary | ICD-10-CM

## 2017-01-31 DIAGNOSIS — E785 Hyperlipidemia, unspecified: Secondary | ICD-10-CM | POA: Diagnosis not present

## 2017-01-31 DIAGNOSIS — F329 Major depressive disorder, single episode, unspecified: Secondary | ICD-10-CM | POA: Diagnosis not present

## 2017-01-31 LAB — GLUCOSE, POCT (MANUAL RESULT ENTRY): POC Glucose: 111 mg/dl — AB (ref 70–99)

## 2017-01-31 LAB — POCT GLYCOSYLATED HEMOGLOBIN (HGB A1C): Hemoglobin A1C: 6.3

## 2017-01-31 MED ORDER — FLUTICASONE PROPIONATE 50 MCG/ACT NA SUSP: 2.0000 | Freq: Every day | NASAL | 2 refills | Status: DC

## 2017-01-31 MED ORDER — SITAGLIPTIN PHOSPHATE 100 MG PO TABS: 100.0000 mg | ORAL_TABLET | Freq: Every day | ORAL | 1 refills | Status: DC

## 2017-01-31 MED ORDER — CITALOPRAM HYDROBROMIDE 40 MG PO TABS: 40.0000 mg | ORAL_TABLET | Freq: Every day | ORAL | 0 refills | Status: DC

## 2017-01-31 MED ORDER — SIMVASTATIN 40 MG PO TABS: 40.0000 mg | ORAL_TABLET | Freq: Every day | ORAL | 0 refills | Status: DC

## 2017-01-31 MED ORDER — DAPAGLIFLOZIN PROPANEDIOL 5 MG PO TABS: 5.0000 mg | ORAL_TABLET | ORAL | 0 refills | Status: DC

## 2017-01-31 MED ORDER — ALPRAZOLAM 1 MG PO TABS: 1.0000 mg | ORAL_TABLET | Freq: Two times a day (BID) | ORAL | 2 refills | Status: DC | PRN

## 2017-01-31 NOTE — Progress Notes (Signed)
Name: Lauren [name]   MRN: [id number]    DOB: 12/05/1975   Date:01/31/2017       Progress Note  Subjective  Chief Complaint  Chief Complaint  Patient presents with  . Medication Refill  . Diabetes    Diabetes  She presents for her follow-up diabetic visit. She has type 2 diabetes mellitus. Her disease course has been improving. There are no hypoglycemic associated symptoms. Pertinent negatives for hypoglycemia include no nervousness/anxiousness. Pertinent negatives for diabetes include no chest pain, no fatigue, no foot paresthesias, no polydipsia and no polyuria. Pertinent negatives for diabetic complications include no CVA or heart disease. Current diabetic treatment includes oral agent (dual therapy). She is following a diabetic and generally healthy diet. She monitors blood glucose at home 1-2 x per day. Her breakfast blood glucose range is generally 90-110 mg/dl. An ACE inhibitor/angiotensin II receptor blocker is not being taken.  Hyperlipidemia  This is a chronic problem. The problem is uncontrolled. Recent lipid tests were reviewed and are high. Pertinent negatives include no chest pain, leg pain, myalgias or shortness of breath. Current antihyperlipidemic treatment includes statins. The current treatment provides significant improvement of lipids. There are no compliance problems.  Risk factors for coronary artery disease include diabetes mellitus, dyslipidemia and obesity.  Anxiety  Presents for follow-up visit. Symptoms include excessive worry, insomnia and panic. Patient reports no chest pain, depressed mood, nervous/anxious behavior or shortness of breath. The severity of symptoms is moderate.       Past Medical History:  Diagnosis Date  . Allergic rhinitis   . Anxiety   . Chronic depression   . Diabetes mellitus without complication ([place])   . Elevated LFTs   . Genital herpes   . GERD (gastroesophageal reflux disease)   . Hyperlipidemia   . Vitamin D deficiency      Past Surgical History:  Procedure Laterality Date  . APPENDECTOMY    . CESAREAN SECTION     x 4   . COLONOSCOPY    . HERNIA REPAIR  09/25/2011  . TUBAL LIGATION    . TYMPANOMASTOIDECTOMY Right 12/07/2015   Procedure: TYMPANOMASTOIDECTOMY;  Surgeon: Clyde Canterbury, MD;  Location: [facility] ORS;  Service: ENT;  Laterality: Right;    Family History  Problem Relation Age of Onset  . Hypertension Mother   . Hyperlipidemia Mother   . Arrhythmia Father 38       A-fib  . Alcohol abuse Father   . Heart disease Brother 43       stent x 1   . Alcoholism Brother   . Asthma Son   . Asperger's syndrome Son     Social History   Social History  . Marital status: Single    Spouse name: N/A  . Number of children: 4  . Years of education: N/A   Occupational History  .  [employer]   Social History Main Topics  . Smoking status: Current Every Day Smoker    Packs/day: 0.50    Years: 25.00    Types: Cigarettes  . Smokeless tobacco: Never Used  . Alcohol use 0.0 oz/week     Comment: occasionally= 3 beers per month.  . Drug use: No  . Sexual activity: Not on file   Other Topics Concern  . Not on file   Social History Narrative  . No narrative on file     Current Outpatient Prescriptions:  .  albuterol (PROVENTIL HFA) 108 (90 Base) MCG/ACT inhaler, Inhale 2 puffs into the lungs  every 6 (six) hours as needed for wheezing or shortness of breath., Disp: 1 Inhaler, Rfl: 3 .  ALPRAZolam (XANAX) 1 MG tablet, Take 1 tablet (1 mg total) by mouth 2 (two) times daily as needed for anxiety., Disp: 60 tablet, Rfl: 2 .  citalopram (CELEXA) 40 MG tablet, Take 1 tablet (40 mg total) by mouth daily., Disp: 90 tablet, Rfl: 0 .  dapagliflozin propanediol (FARXIGA) 5 MG TABS tablet, Take 5 mg by mouth every morning. 9 A.M, Disp: 90 tablet, Rfl: 0 .  fluticasone (FLONASE) 50 MCG/ACT nasal spray, Place 2 sprays into both nostrils daily., Disp: , Rfl: 11 .  ranitidine (ZANTAC) 150 MG tablet, TAKE 1 TABLET (150  MG TOTAL) BY MOUTH AT BEDTIME., Disp: 90 tablet, Rfl: 0 .  simvastatin (ZOCOR) 40 MG tablet, Take 1 tablet (40 mg total) by mouth daily at 6 PM., Disp: 90 tablet, Rfl: 0 .  sitaGLIPtin (JANUVIA) 100 MG tablet, Take 1 tablet (100 mg total) by mouth daily., Disp: 90 tablet, Rfl: 1 .  valACYclovir (VALTREX) 1000 MG tablet, Take 1 tablet (1,000 mg total) by mouth daily., Disp: 90 tablet, Rfl: 3 .  cyclobenzaprine (FLEXERIL) 5 MG tablet, Take 1 tablet (5 mg total) by mouth 3 (three) times daily as needed for muscle spasms. (Patient not taking: Reported on 01/31/2017), Disp: 15 tablet, Rfl: 0 .  nabumetone (RELAFEN) 750 MG tablet, Take 1 tablet (750 mg total) by mouth 2 (two) times daily. (Patient not taking: Reported on 01/31/2017), Disp: 30 tablet, Rfl: 0 .  Vitamin D, Ergocalciferol, (DRISDOL) 50000 units CAPS capsule, Take 1 capsule (50,000 Units total) by mouth once a week. For 12 weeks (Patient not taking: Reported on 01/31/2017), Disp: 12 capsule, Rfl: 0  No Known Allergies   Review of Systems  Constitutional: Negative for fatigue.  Respiratory: Negative for shortness of breath.   Cardiovascular: Negative for chest pain.  Musculoskeletal: Negative for myalgias.  Endo/Heme/Allergies: Negative for polydipsia.  Psychiatric/Behavioral: The patient has insomnia. The patient is not nervous/anxious.       Objective  Vitals:   01/31/17 1138  BP: 136/76  Pulse: 77  Resp: 16  Temp: 98.3 F (36.8 C)  TempSrc: Oral  SpO2: 96%  Weight: 212 lb 8 oz (96.4 kg)  Height: 5\' 3"  (1.6 m)    Physical Exam  Constitutional: She is oriented to person, place, and time and well-developed, well-nourished, and in no distress.  HENT:  Head: Normocephalic and atraumatic.  Cardiovascular: Normal rate, regular rhythm and normal heart sounds.  Exam reveals no gallop.   No murmur heard. Pulmonary/Chest: Effort normal and breath sounds normal. She has no decreased breath sounds. She has no wheezes. She has no  rhonchi.  Abdominal: Soft. Bowel sounds are normal. There is no tenderness.  Musculoskeletal:       Right ankle: She exhibits no swelling. No tenderness.       Left ankle: She exhibits no swelling. No tenderness.  Neurological: She is alert and oriented to person, place, and time.  Skin: Skin is warm, dry and intact.  Psychiatric: Mood, memory, affect and judgment normal.  Nursing note and vitals reviewed.     Recent Results (from the past 2160 hour(s))  POCT HgB A1C     Status: Normal   Collection Time: 01/31/17 11:45 AM  Result Value Ref Range   Hemoglobin A1C 6.3   POCT Glucose (CBG)     Status: Abnormal   Collection Time: 01/31/17 11:45 AM  Result Value Ref  Range   POC Glucose 111 (A) 70 - 99 mg/dl     Assessment & Plan  1. Anxiety and depression Stable and responsive to Xanax taken twice a day as needed, continue on citalopram 40 mg daily as prescribed, refills provided - ALPRAZolam (XANAX) 1 MG tablet; Take 1 tablet (1 mg total) by mouth 2 (two) times daily as needed for anxiety.  Dispense: 60 tablet; Refill: 2 - citalopram (CELEXA) 40 MG tablet; Take 1 tablet (40 mg total) by mouth daily.  Dispense: 90 tablet; Refill: 0  2. Hyperlipidemia, unspecified hyperlipidemia type  - simvastatin (ZOCOR) 40 MG tablet; Take 1 tablet (40 mg total) by mouth daily at 6 PM.  Dispense: 90 tablet; Refill: 0 - Lipid panel - COMPLETE METABOLIC PANEL WITH GFR  3. Type 2 diabetes mellitus without complication, without long-term current use of insulin (H[facility])   A1c 6.3%, well-controlled diabetes - POCT HgB A1C - POCT Glucose (CBG) - dapagliflozin propanediol (FARXIGA) 5 MG TABS tablet; Take 5 mg by mouth every morning. 9 A.M  Dispense: 90 tablet; Refill: 0 - sitaGLIPtin (JANUVIA) 100 MG tablet; Take 1 tablet (100 mg total) by mouth daily.  Dispense: 90 tablet; Refill: 1  4. Sinus pressure  - fluticasone (FLONASE) 50 MCG/ACT nasal spray; Place 2 sprays into both nostrils daily.   Dispense: 16 g; Refill: 2  Sweta Halseth [facility] 01/31/2017 11:46 AM

## 2017-03-05 ENCOUNTER — Other Ambulatory Visit: Payer: Self-pay | Admitting: Family Medicine

## 2017-03-05 DIAGNOSIS — K219 Gastro-esophageal reflux disease without esophagitis: Secondary | ICD-10-CM

## 2017-04-04 ENCOUNTER — Other Ambulatory Visit: Payer: Self-pay | Admitting: Family Medicine

## 2017-04-04 DIAGNOSIS — Z8619 Personal history of other infectious and parasitic diseases: Secondary | ICD-10-CM

## 2017-04-12 ENCOUNTER — Other Ambulatory Visit: Payer: Self-pay

## 2017-04-12 DIAGNOSIS — J3489 Other specified disorders of nose and nasal sinuses: Secondary | ICD-10-CM

## 2017-04-12 MED ORDER — FLUTICASONE PROPIONATE 50 MCG/ACT NA SUSP: 2.0000 | Freq: Every day | NASAL | 2 refills | Status: DC

## 2017-04-23 ENCOUNTER — Ambulatory Visit: Payer: Managed Care, Other (non HMO) | Admitting: Family Medicine

## 2017-04-23 ENCOUNTER — Encounter: Payer: Self-pay | Admitting: Family Medicine

## 2017-04-23 VITALS — BP 130/70 | HR 94 | Resp 18 | Ht 63.0 in | Wt 210.6 lb

## 2017-04-23 DIAGNOSIS — M25511 Pain in right shoulder: Secondary | ICD-10-CM | POA: Diagnosis not present

## 2017-04-23 DIAGNOSIS — Z23 Encounter for immunization: Secondary | ICD-10-CM

## 2017-04-23 DIAGNOSIS — E1169 Type 2 diabetes mellitus with other specified complication: Secondary | ICD-10-CM | POA: Diagnosis not present

## 2017-04-23 DIAGNOSIS — J302 Other seasonal allergic rhinitis: Secondary | ICD-10-CM | POA: Insufficient documentation

## 2017-04-23 DIAGNOSIS — E119 Type 2 diabetes mellitus without complications: Secondary | ICD-10-CM | POA: Insufficient documentation

## 2017-04-23 DIAGNOSIS — E669 Obesity, unspecified: Secondary | ICD-10-CM

## 2017-04-23 DIAGNOSIS — E1165 Type 2 diabetes mellitus with hyperglycemia: Secondary | ICD-10-CM | POA: Insufficient documentation

## 2017-04-23 DIAGNOSIS — A6000 Herpesviral infection of urogenital system, unspecified: Secondary | ICD-10-CM | POA: Insufficient documentation

## 2017-04-23 DIAGNOSIS — IMO0002 Reserved for concepts with insufficient information to code with codable children: Secondary | ICD-10-CM | POA: Insufficient documentation

## 2017-04-23 MED ORDER — PREDNISONE 10 MG (48) PO TBPK: ORAL_TABLET | ORAL | 0 refills | Status: DC

## 2017-04-23 NOTE — Progress Notes (Signed)
Name: Lauren [name]   MRN: [id number]    DOB: 1976/01/14   Date:04/23/2017       Progress Note  Subjective  Chief Complaint  Chief Complaint  Patient presents with  . Shoulder Pain    HPI  Shoulder pain: acute injury, it happened while at the gym. She was using a workout shoulder machine and heard a pop and acute onset of pain on right shoulder, over deltoid area. She stopped the activity slowly, and started Ibuprofen and using topical medication in the hope that symptoms would improve, however pain is getting worse, causing difficulty sleeping, cannot get comfortable at night. No swelling, rash or redness.   DM: last hgbA1C was at goal, she states glucose at home in the low 100's. Denies polyphagia, polydipsia or polyuria.    Patient Active Problem List   Diagnosis Date Noted  . Allergic rhinitis, seasonal 04/23/2017  . Genital herpes 04/23/2017  . Diabetes mellitus without complication ([place]) 86/76/7209  . Sinus pressure 01/31/2017  . 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 Cholesteatoma of attic of right ear 12/05/2015  . Nocturnal cough 11/29/2015  . GERD (gastroesophageal reflux disease) 11/29/2015  . Acid reflux 09/19/2015  . Screening for breast cancer 09/19/2015  . Chronic pelvic pain in female 09/19/2015  . Vitamin D deficiency 12/13/2014  . History of herpes genitalis 12/13/2014  . Hyperlipidemia 05/26/2014  . Obesity 05/26/2014  . Smoker 05/26/2014    Past Surgical History:  Procedure Laterality Date  . APPENDECTOMY    . CESAREAN SECTION     x 4   . COLONOSCOPY    . HERNIA REPAIR  09/25/2011  . TUBAL LIGATION      Family History  Problem Relation Age of Onset  . Hypertension Mother   . Hyperlipidemia Mother   . Arrhythmia Father 23       A-fib  . Alcohol abuse Father   . Heart disease Brother 43       stent x 1   . Alcoholism Brother   . Asthma Son   . Asperger's syndrome Son     Social History   Socioeconomic  History  . Marital status: Single    Spouse name: Not on file  . Number of children: 4  . Years of education: Not on file  . Highest education level: Not on file  Social Needs  . Financial resource strain: Not on file  . Food insecurity - worry: Not on file  . Food insecurity - inability: Not on file  . Transportation needs - medical: Not on file  . Transportation needs - non-medical: Not on file  Occupational History    Employer: [employer]  Tobacco Use  . Smoking status: Current Every Day Smoker    Packs/day: 0.50    Years: 25.00    Pack years: 12.50    Types: Cigarettes  . Smokeless tobacco: Never Used  Substance and Sexual Activity  . Alcohol use: Yes    Alcohol/week: 0.0 oz    Comment: occasionally= 3 beers per month.  . Drug use: No  . Sexual activity: Not on file  Other Topics Concern  . Not on file  Social History Narrative  . Not on file     Current Outpatient Medications:  .  albuterol (PROVENTIL HFA) 108 (90 Base) MCG/ACT inhaler, Inhale 2 puffs into the lungs every 6 (six) hours as needed for wheezing or shortness of breath., Disp: 1 Inhaler, Rfl: 3 .  ALPRAZolam Duanne Moron)  1 MG tablet, Take 1 tablet (1 mg total) by mouth 2 (two) times daily as needed for anxiety., Disp: 60 tablet, Rfl: 2 .  azelastine (ASTELIN) 0.1 % nasal spray, SPRAY ONCE IN EACH NOSTRIL TWICE DAILY, Disp: , Rfl: 12 .  citalopram (CELEXA) 40 MG tablet, Take 1 tablet (40 mg total) by mouth daily., Disp: 90 tablet, Rfl: 0 .  dapagliflozin propanediol (FARXIGA) 5 MG TABS tablet, Take 5 mg by mouth every morning. 9 A.M, Disp: 90 tablet, Rfl: 0 .  Fluocinonide 0.1 % CREA, , Disp: , Rfl:  .  fluticasone (FLONASE) 50 MCG/ACT nasal spray, Place 2 sprays into both nostrils daily., Disp: 16 g, Rfl: 2 .  nabumetone (RELAFEN) 750 MG tablet, Take 1 tablet (750 mg total) by mouth 2 (two) times daily., Disp: 30 tablet, Rfl: 0 .  ranitidine (ZANTAC) 150 MG tablet, TAKE 1 TABLET (150 MG TOTAL) BY MOUTH AT BEDTIME.,  Disp: 90 tablet, Rfl: 0 .  simvastatin (ZOCOR) 40 MG tablet, Take 1 tablet (40 mg total) by mouth daily at 6 PM., Disp: 90 tablet, Rfl: 0 .  sitaGLIPtin (JANUVIA) 100 MG tablet, Take 1 tablet (100 mg total) by mouth daily., Disp: 90 tablet, Rfl: 1 .  valACYclovir (VALTREX) 1000 MG tablet, TAKE 1 TABLET (1,000 MG TOTAL) BY MOUTH DAILY., Disp: 90 tablet, Rfl: 3 .  predniSONE (STERAPRED UNI-PAK 48 TAB) 10 MG (48) TBPK tablet, Take as directed, Disp: 48 tablet, Rfl: 0  No Known Allergies   ROS  Ten systems reviewed and is negative except as mentioned in HPI   Objective  Vitals:   04/23/17 1539  BP: 130/70  Pulse: 94  Resp: 18  SpO2: 94%  Weight: 210 lb 9.6 oz (95.5 kg)  Height: 5\' 3"  (1.6 m)    Body mass index is 37.31 kg/m.  Physical Exam  Constitutional: Patient appears well-developed and well-nourished. Obese No distress.  HEENT: head atraumatic, normocephalic, pupils equal and reactive to light, neck supple, throat within normal limits Cardiovascular: Normal rate, regular rhythm and normal heart sounds.  No murmur heard. No BLE edema. Pulmonary/Chest: Effort normal and breath sounds normal. No respiratory distress. Abdominal: Soft.  There is no tenderness. Psychiatric: Patient has a normal mood and affect. behavior is normal. Judgment and thought content normal. Muscular skeletal: pain with internal rotation of shoulder and palpation of deltoid bursa, normal abduction and negative empty can sign.   Recent Results (from the past 2160 hour(s))  POCT HgB A1C     Status: Normal   Collection Time: 01/31/17 11:45 AM  Result Value Ref Range   Hemoglobin A1C 6.3   POCT Glucose (CBG)     Status: Abnormal   Collection Time: 01/31/17 11:45 AM  Result Value Ref Range   POC Glucose 111 (A) 70 - 99 mg/dl      PHQ2/9: Depression screen [facility] 2/9 04/23/2017 01/31/2017 10/18/2016 06/26/2016 05/17/2016  Decreased Interest 0 0 0 0 0  Down, Depressed, Hopeless 0 0 0 0 0  PHQ - 2 Score 0 0  0 0 0     Fall Risk: Fall Risk  04/23/2017 01/31/2017 10/18/2016 06/26/2016 05/17/2016  Falls in the past year? No No No No No     Functional Status Survey: Is the patient deaf or have difficulty hearing?: No Does the patient have difficulty seeing, even when wearing glasses/contacts?: No Does the patient have difficulty concentrating, remembering, or making decisions?: No Does the patient have difficulty walking or climbing stairs?: No Does the  patient have difficulty dressing or bathing?: No Does the patient have difficulty doing errands alone such as visiting a doctor's office or shopping?: No   Assessment & Plan  1. Diabetes mellitus type 2 in obese [facility])  Explained importance of compliant with diabetes diet while on prednisone  2. Need for immunization against influenza  - Flu Vaccine QUAD 6+ mos PF IM (Fluarix Quad PF)  3. Acute pain of right shoulder  Stop ibuprofen or any other nsaid's while of prednisone, you can take Tylenol for pain  - predniSONE (STERAPRED UNI-PAK 48 TAB) 10 MG (48) TBPK tablet; Take as directed  Dispense: 48 tablet; Refill: 0 - Ambulatory referral to Physical Therapy

## 2017-04-30 ENCOUNTER — Ambulatory Visit: Payer: Managed Care, Other (non HMO) | Attending: Family Medicine

## 2017-05-02 ENCOUNTER — Ambulatory Visit: Payer: Managed Care, Other (non HMO)

## 2017-05-06 ENCOUNTER — Other Ambulatory Visit: Payer: Self-pay | Admitting: Family Medicine

## 2017-05-06 DIAGNOSIS — E119 Type 2 diabetes mellitus without complications: Secondary | ICD-10-CM

## 2017-05-07 ENCOUNTER — Ambulatory Visit: Payer: Managed Care, Other (non HMO)

## 2017-05-31 ENCOUNTER — Other Ambulatory Visit: Payer: Self-pay

## 2017-05-31 DIAGNOSIS — F419 Anxiety disorder, unspecified: Secondary | ICD-10-CM

## 2017-05-31 DIAGNOSIS — K219 Gastro-esophageal reflux disease without esophagitis: Secondary | ICD-10-CM

## 2017-05-31 DIAGNOSIS — F32A Depression, unspecified: Secondary | ICD-10-CM

## 2017-05-31 DIAGNOSIS — E785 Hyperlipidemia, unspecified: Secondary | ICD-10-CM

## 2017-05-31 DIAGNOSIS — F329 Major depressive disorder, single episode, unspecified: Secondary | ICD-10-CM

## 2017-06-03 ENCOUNTER — Other Ambulatory Visit: Payer: Self-pay

## 2017-06-03 DIAGNOSIS — F329 Major depressive disorder, single episode, unspecified: Secondary | ICD-10-CM

## 2017-06-03 DIAGNOSIS — F32A Depression, unspecified: Secondary | ICD-10-CM

## 2017-06-03 DIAGNOSIS — F419 Anxiety disorder, unspecified: Principal | ICD-10-CM

## 2017-06-03 DIAGNOSIS — K219 Gastro-esophageal reflux disease without esophagitis: Secondary | ICD-10-CM

## 2017-06-03 DIAGNOSIS — E785 Hyperlipidemia, unspecified: Secondary | ICD-10-CM

## 2017-06-04 ENCOUNTER — Other Ambulatory Visit: Payer: Self-pay | Admitting: Family Medicine

## 2017-06-04 DIAGNOSIS — F329 Major depressive disorder, single episode, unspecified: Secondary | ICD-10-CM

## 2017-06-04 DIAGNOSIS — E785 Hyperlipidemia, unspecified: Secondary | ICD-10-CM

## 2017-06-04 DIAGNOSIS — F32A Depression, unspecified: Secondary | ICD-10-CM

## 2017-06-04 DIAGNOSIS — K219 Gastro-esophageal reflux disease without esophagitis: Secondary | ICD-10-CM

## 2017-06-04 DIAGNOSIS — F419 Anxiety disorder, unspecified: Secondary | ICD-10-CM

## 2017-06-29 ENCOUNTER — Other Ambulatory Visit: Payer: Self-pay | Admitting: Family Medicine

## 2017-06-29 DIAGNOSIS — E785 Hyperlipidemia, unspecified: Secondary | ICD-10-CM

## 2017-06-29 DIAGNOSIS — F329 Major depressive disorder, single episode, unspecified: Secondary | ICD-10-CM

## 2017-06-29 DIAGNOSIS — F419 Anxiety disorder, unspecified: Secondary | ICD-10-CM

## 2017-06-29 DIAGNOSIS — F32A Depression, unspecified: Secondary | ICD-10-CM

## 2017-06-29 DIAGNOSIS — K219 Gastro-esophageal reflux disease without esophagitis: Secondary | ICD-10-CM

## 2017-07-02 ENCOUNTER — Ambulatory Visit
Admission: RE | Admit: 2017-07-02 | Discharge: 2017-07-02 | Disposition: A | Discharge status: Home / Self Care | Payer: Managed Care, Other (non HMO) | Source: Ambulatory Visit | Attending: Family Medicine | Admitting: Family Medicine

## 2017-07-02 ENCOUNTER — Encounter: Payer: Self-pay | Admitting: Family Medicine

## 2017-07-02 ENCOUNTER — Ambulatory Visit: Payer: Managed Care, Other (non HMO) | Admitting: Family Medicine

## 2017-07-02 VITALS — BP 124/78 | HR 78 | Temp 98.3°F | Resp 18 | Ht 63.0 in | Wt 204.5 lb

## 2017-07-02 DIAGNOSIS — J029 Acute pharyngitis, unspecified: Secondary | ICD-10-CM

## 2017-07-02 DIAGNOSIS — R509 Fever, unspecified: Secondary | ICD-10-CM

## 2017-07-02 DIAGNOSIS — R058 Other specified cough: Secondary | ICD-10-CM

## 2017-07-02 DIAGNOSIS — R05 Cough: Secondary | ICD-10-CM

## 2017-07-02 LAB — POCT RAPID STREP A (OFFICE): Rapid Strep A Screen: NEGATIVE

## 2017-07-02 LAB — POCT INFLUENZA A/B
Influenza A, POC: NEGATIVE
Influenza B, POC: NEGATIVE

## 2017-07-02 NOTE — Progress Notes (Signed)
Name: Lauren [name]   MRN: [id number]    DOB: [birth year]   Date:07/02/2017       Progress Note  Subjective  Chief Complaint  Chief Complaint  Patient presents with  . Influenza    Bodyaches and chills, sneezing, fever 100 4:30 am, nasal and chest congestion, coughing, head pressure sore throat since yesterday morning     Sore Throat   This is a new problem. The current episode started yesterday. The maximum temperature recorded prior to her arrival was 100.4 - 100.9 F. Associated symptoms include congestion, coughing, ear discharge (she has noticed small amount of brownish discharge from the right ear.) and headaches. She has had no exposure to strep. Treatments tried: Mucinex Cold and Flu.     Past Medical History:  Diagnosis Date  . Allergic rhinitis   . Anxiety   . Chronic depression   . Diabetes mellitus without complication ([place])   . Elevated LFTs   . Genital herpes   . GERD (gastroesophageal reflux disease)   . Hyperlipidemia   . Vitamin D deficiency     Past Surgical History:  Procedure Laterality Date  . APPENDECTOMY    . CESAREAN SECTION     x 4   . COLONOSCOPY    . HERNIA REPAIR  09/25/2011  . TUBAL LIGATION    . TYMPANOMASTOIDECTOMY Right 12/07/2015   Procedure: TYMPANOMASTOIDECTOMY;  Surgeon: Clyde Canterbury, MD;  Location: [facility] ORS;  Service: ENT;  Laterality: Right;    Family History  Problem Relation Age of Onset  . Hypertension Mother   . Hyperlipidemia Mother   . Arrhythmia Father 84       A-fib  . Alcohol abuse Father   . Heart disease Brother 43       stent x 1   . Alcoholism Brother   . Asthma Son   . Asperger's syndrome Son     Social History   Socioeconomic History  . Marital status: Single    Spouse name: Not on file  . Number of children: 4  . Years of education: Not on file  . Highest education level: Not on file  Social Needs  . Financial resource strain: Not on file  . Food insecurity - worry: Not on file  . Food insecurity -  inability: Not on file  . Transportation needs - medical: Not on file  . Transportation needs - non-medical: Not on file  Occupational History    Employer: [employer]  Tobacco Use  . Smoking status: Current Every Day Smoker    Packs/day: 0.50    Years: 25.00    Pack years: 12.50    Types: Cigarettes  . Smokeless tobacco: Never Used  Substance and Sexual Activity  . Alcohol use: Yes    Alcohol/week: 0.0 oz    Comment: occasionally= 3 beers per month.  . Drug use: No  . Sexual activity: Not on file  Other Topics Concern  . Not on file  Social History Narrative  . Not on file     Current Outpatient Medications:  .  albuterol (PROVENTIL HFA) 108 (90 Base) MCG/ACT inhaler, Inhale 2 puffs into the lungs every 6 (six) hours as needed for wheezing or shortness of breath., Disp: 1 Inhaler, Rfl: 3 .  ALPRAZolam (XANAX) 1 MG tablet, Take 1 tablet (1 mg total) by mouth 2 (two) times daily as needed for anxiety., Disp: 60 tablet, Rfl: 2 .  azelastine (ASTELIN) 0.1 % nasal spray, SPRAY ONCE IN EACH NOSTRIL TWICE  DAILY, Disp: , Rfl: 12 .  citalopram (CELEXA) 40 MG tablet, Take 1 tablet (40 mg total) by mouth daily., Disp: 90 tablet, Rfl: 0 .  dapagliflozin propanediol (FARXIGA) 5 MG TABS tablet, Take 5 mg by mouth every morning. 9 A.M, Disp: 90 tablet, Rfl: 0 .  Fluocinonide 0.1 % CREA, , Disp: , Rfl:  .  fluticasone (FLONASE) 50 MCG/ACT nasal spray, Place 2 sprays into both nostrils daily., Disp: 16 g, Rfl: 2 .  ranitidine (ZANTAC) 150 MG tablet, TAKE 1 TABLET (150 MG TOTAL) BY MOUTH AT BEDTIME., Disp: 90 tablet, Rfl: 0 .  simvastatin (ZOCOR) 40 MG tablet, Take 1 tablet (40 mg total) by mouth daily at 6 PM., Disp: 90 tablet, Rfl: 0 .  sitaGLIPtin (JANUVIA) 100 MG tablet, Take 1 tablet (100 mg total) by mouth daily., Disp: 90 tablet, Rfl: 1 .  valACYclovir (VALTREX) 1000 MG tablet, TAKE 1 TABLET (1,000 MG TOTAL) BY MOUTH DAILY., Disp: 90 tablet, Rfl: 3 .  nabumetone (RELAFEN) 750 MG tablet, Take 1  tablet (750 mg total) by mouth 2 (two) times daily. (Patient not taking: Reported on 07/02/2017), Disp: 30 tablet, Rfl: 0 .  predniSONE (STERAPRED UNI-PAK 48 TAB) 10 MG (48) TBPK tablet, Take as directed (Patient not taking: Reported on 07/02/2017), Disp: 48 tablet, Rfl: 0  No Known Allergies   Review of Systems  HENT: Positive for congestion and ear discharge (she has noticed small amount of brownish discharge from the right ear.).   Respiratory: Positive for cough.   Neurological: Positive for headaches.    Objective  Vitals:   07/02/17 1012 07/02/17 1013  BP:  124/78  Pulse: 78 78  Resp:  18  Temp: 98.3 F (36.8 C) 98.3 F (36.8 C)  TempSrc: Oral Oral  SpO2: 98% 98%  Weight:  204 lb 8 oz (92.8 kg)  Height:  5\' 3"  (1.6 m)    Physical Exam  Constitutional: She is oriented to person, place, and time and well-developed, well-nourished, and in no distress.  HENT:  Head: Normocephalic and atraumatic.  Right Ear: No drainage or swelling.  Left Ear: No drainage or swelling.  Mouth/Throat: No posterior oropharyngeal erythema.  Right ear canal with semi solid brown colored wax, no blood or pus drainage Left ear canal clear, TM normal, Nasal mucosal inflammation, turbinate hypertrophy,  Cardiovascular: Normal rate, regular rhythm, S1 normal, S2 normal and normal heart sounds.  No murmur heard. Pulmonary/Chest: Effort normal. No respiratory distress. She has decreased breath sounds. She has wheezes in the right middle field and the right lower field.  Neurological: She is alert and oriented to person, place, and time.  Nursing note and vitals reviewed.      Recent Results (from the past 2160 hour(s))  POCT rapid strep A     Status: Normal   Collection Time: 07/02/17 10:33 AM  Result Value Ref Range   Rapid Strep A Screen Negative Negative  POCT Influenza A/B     Status: Normal   Collection Time: 07/02/17 10:38 AM  Result Value Ref Range   Influenza A, POC Negative  Negative   Influenza B, POC Negative Negative     Assessment & Plan  1. Sore throat Rapid strep and flu are both negative - POCT rapid strep A - POCT Influenza A/B  2. Fever and chills Rapid strep and flu are both negative - POCT rapid strep A - POCT Influenza A/B  3. Productive cough Differential includes community-acquired pneumonia versus bronchitis versus viral URI.  We will obtain chest x-ray for evaluation. Advised to continue to take Mucinex and Tylenol for fever, we will review chest x-ray and decide on antimicrobial treatment. - DG Chest 2 View; Future   Ajah Vanhoose [facility] 07/02/2017 10:40 AM

## 2017-07-03 ENCOUNTER — Other Ambulatory Visit: Payer: Self-pay | Admitting: Family Medicine

## 2017-07-03 DIAGNOSIS — J40 Bronchitis, not specified as acute or chronic: Secondary | ICD-10-CM

## 2017-07-03 MED ORDER — DOXYCYCLINE HYCLATE 100 MG PO CAPS: 100.0000 mg | ORAL_CAPSULE | Freq: Two times a day (BID) | ORAL | 0 refills | Status: AC

## 2017-07-09 ENCOUNTER — Ambulatory Visit: Payer: Managed Care, Other (non HMO) | Admitting: Family Medicine

## 2017-07-11 ENCOUNTER — Encounter: Payer: Self-pay | Admitting: Family Medicine

## 2017-07-11 ENCOUNTER — Ambulatory Visit: Payer: Managed Care, Other (non HMO) | Admitting: Family Medicine

## 2017-07-11 VITALS — BP 116/62 | HR 75 | Temp 98.6°F | Resp 16 | Ht 63.0 in | Wt 207.8 lb

## 2017-07-11 DIAGNOSIS — Z23 Encounter for immunization: Secondary | ICD-10-CM

## 2017-07-11 DIAGNOSIS — E785 Hyperlipidemia, unspecified: Secondary | ICD-10-CM | POA: Diagnosis not present

## 2017-07-11 DIAGNOSIS — R05 Cough: Secondary | ICD-10-CM

## 2017-07-11 DIAGNOSIS — R058 Other specified cough: Secondary | ICD-10-CM

## 2017-07-11 DIAGNOSIS — F32A Depression, unspecified: Secondary | ICD-10-CM

## 2017-07-11 DIAGNOSIS — F329 Major depressive disorder, single episode, unspecified: Secondary | ICD-10-CM

## 2017-07-11 DIAGNOSIS — F419 Anxiety disorder, unspecified: Secondary | ICD-10-CM | POA: Diagnosis not present

## 2017-07-11 DIAGNOSIS — E119 Type 2 diabetes mellitus without complications: Secondary | ICD-10-CM | POA: Diagnosis not present

## 2017-07-11 LAB — POCT GLYCOSYLATED HEMOGLOBIN (HGB A1C): Hemoglobin A1C: >14

## 2017-07-11 MED ORDER — DAPAGLIFLOZIN PROPANEDIOL 5 MG PO TABS: 5.0000 mg | ORAL_TABLET | ORAL | 0 refills | Status: DC

## 2017-07-11 MED ORDER — ALBUTEROL SULFATE HFA 108 (90 BASE) MCG/ACT IN AERS: 2.0000 | INHALATION_SPRAY | Freq: Four times a day (QID) | RESPIRATORY_TRACT | 3 refills | Status: DC | PRN

## 2017-07-11 MED ORDER — CITALOPRAM HYDROBROMIDE 40 MG PO TABS: 40.0000 mg | ORAL_TABLET | Freq: Every day | ORAL | 0 refills | Status: DC

## 2017-07-11 MED ORDER — SITAGLIPTIN PHOSPHATE 100 MG PO TABS: 100.0000 mg | ORAL_TABLET | Freq: Every day | ORAL | 1 refills | Status: DC

## 2017-07-11 MED ORDER — SIMVASTATIN 40 MG PO TABS: 40.0000 mg | ORAL_TABLET | Freq: Every day | ORAL | 0 refills | Status: DC

## 2017-07-11 MED ORDER — ALPRAZOLAM 0.5 MG PO TABS: 0.5000 mg | ORAL_TABLET | Freq: Two times a day (BID) | ORAL | 0 refills | Status: DC | PRN

## 2017-07-11 NOTE — Progress Notes (Signed)
Name: Lauren [name]   MRN: [id number]    DOB: 10-06-1975   Date:07/11/2017       Progress Note  Subjective  Chief Complaint  Chief Complaint  Patient presents with  . Medication Refill  . Diabetes    Pt states she check sugars daily. Pt states they been good  . Hyperlipidemia  . Anxiety    Diabetes  She presents for her follow-up diabetic visit. She has type 2 diabetes mellitus. Her disease course has been stable. Hypoglycemia symptoms include nervousness/anxiousness. Pertinent negatives for hypoglycemia include no dizziness, headaches or sweats. Pertinent negatives for diabetes include no fatigue, no foot paresthesias, no polydipsia and no polyuria. Pertinent negatives for diabetic complications include no CVA, heart disease or peripheral neuropathy. Current diabetic treatment includes oral agent (dual therapy). She is compliant with treatment all of the time. She is following a generally healthy and diabetic diet. She monitors blood glucose at home 1-2 x per day. Her breakfast blood glucose range is generally 110-130 mg/dl. An ACE inhibitor/angiotensin II receptor blocker is not being taken. Eye exam is current.  Hyperlipidemia  This is a chronic problem. The problem is uncontrolled. Recent lipid tests were reviewed and are high. Exacerbating diseases include diabetes and obesity. Pertinent negatives include no leg pain, myalgias or shortness of breath. Current antihyperlipidemic treatment includes statins. Risk factors for coronary artery disease include diabetes mellitus and dyslipidemia.  Anxiety  Presents for follow-up visit. Symptoms include depressed mood, excessive worry, irritability and nervous/anxious behavior. Patient reports no dizziness or shortness of breath. The severity of symptoms is moderate and causing significant distress. The quality of sleep is fair.    She has history of bronchitis and takes Albuterol inhaler as needed if she feels tightness in her chest or coughing,  she has not needed to take the inhaler regularly, no dyspnea at rest. She has no history of Asthma or COPD but she smokes 1 PPD.   Past Medical History:  Diagnosis Date  . Allergic rhinitis   . Anxiety   . Chronic depression   . Diabetes mellitus without complication ([place])   . Elevated LFTs   . Genital herpes   . GERD (gastroesophageal reflux disease)   . Hyperlipidemia   . Vitamin D deficiency     Past Surgical History:  Procedure Laterality Date  . APPENDECTOMY    . CESAREAN SECTION     x 4   . COLONOSCOPY    . HERNIA REPAIR  09/25/2011  . TUBAL LIGATION    . TYMPANOMASTOIDECTOMY Right 12/07/2015   Procedure: TYMPANOMASTOIDECTOMY;  Surgeon: Clyde Canterbury, MD;  Location: [facility] ORS;  Service: ENT;  Laterality: Right;    Family History  Problem Relation Age of Onset  . Hypertension Mother   . Hyperlipidemia Mother   . Arrhythmia Father 62       A-fib  . Alcohol abuse Father   . Heart disease Brother 43       stent x 1   . Alcoholism Brother   . Asthma Son   . Asperger's syndrome Son     Social History   Socioeconomic History  . Marital status: Single    Spouse name: Not on file  . Number of children: 4  . Years of education: Not on file  . Highest education level: Not on file  Social Needs  . Financial resource strain: Not on file  . Food insecurity - worry: Not on file  . Food insecurity - inability: Not on file  .  Transportation needs - medical: Not on file  . Transportation needs - non-medical: Not on file  Occupational History    Employer: [employer]  Tobacco Use  . Smoking status: Current Every Day Smoker    Packs/day: 0.50    Years: 25.00    Pack years: 12.50    Types: Cigarettes  . Smokeless tobacco: Never Used  Substance and Sexual Activity  . Alcohol use: Yes    Alcohol/week: 0.0 oz    Comment: occasionally= 3 beers per month.  . Drug use: No  . Sexual activity: Not on file  Other Topics Concern  . Not on file  Social History Narrative  . Not on  file     Current Outpatient Medications:  .  albuterol (PROVENTIL HFA) 108 (90 Base) MCG/ACT inhaler, Inhale 2 puffs into the lungs every 6 (six) hours as needed for wheezing or shortness of breath., Disp: 1 Inhaler, Rfl: 3 .  azelastine (ASTELIN) 0.1 % nasal spray, SPRAY ONCE IN EACH NOSTRIL TWICE DAILY, Disp: , Rfl: 12 .  citalopram (CELEXA) 40 MG tablet, Take 1 tablet (40 mg total) by mouth daily., Disp: 90 tablet, Rfl: 0 .  dapagliflozin propanediol (FARXIGA) 5 MG TABS tablet, Take 5 mg by mouth every morning. 9 A.M, Disp: 90 tablet, Rfl: 0 .  Fluocinonide 0.1 % CREA, as needed. , Disp: , Rfl:  .  fluticasone (FLONASE) 50 MCG/ACT nasal spray, Place 2 sprays into both nostrils daily., Disp: 16 g, Rfl: 2 .  ranitidine (ZANTAC) 150 MG tablet, TAKE 1 TABLET (150 MG TOTAL) BY MOUTH AT BEDTIME., Disp: 90 tablet, Rfl: 0 .  simvastatin (ZOCOR) 40 MG tablet, Take 1 tablet (40 mg total) by mouth daily at 6 PM., Disp: 90 tablet, Rfl: 0 .  sitaGLIPtin (JANUVIA) 100 MG tablet, Take 1 tablet (100 mg total) by mouth daily., Disp: 90 tablet, Rfl: 1 .  valACYclovir (VALTREX) 1000 MG tablet, TAKE 1 TABLET (1,000 MG TOTAL) BY MOUTH DAILY., Disp: 90 tablet, Rfl: 3 .  ALPRAZolam (XANAX) 1 MG tablet, Take 1 tablet (1 mg total) by mouth 2 (two) times daily as needed for anxiety., Disp: 60 tablet, Rfl: 2 .  nabumetone (RELAFEN) 750 MG tablet, Take 1 tablet (750 mg total) by mouth 2 (two) times daily. (Patient not taking: Reported on 07/02/2017), Disp: 30 tablet, Rfl: 0 .  predniSONE (STERAPRED UNI-PAK 48 TAB) 10 MG (48) TBPK tablet, Take as directed (Patient not taking: Reported on 07/02/2017), Disp: 48 tablet, Rfl: 0  No Known Allergies   Review of Systems  Constitutional: Positive for irritability. Negative for fatigue.  Respiratory: Negative for shortness of breath.   Musculoskeletal: Negative for myalgias.  Neurological: Negative for dizziness and headaches.  Endo/Heme/Allergies: Negative for polydipsia.   Psychiatric/Behavioral: The patient is nervous/anxious.       Objective  Vitals:   07/11/17 1121  BP: 116/62  Pulse: 75  Resp: 16  Temp: 98.6 F (37 C)  TempSrc: Oral  SpO2: 97%  Weight: 207 lb 12.8 oz (94.3 kg)  Height: 5\' 3"  (1.6 m)    Physical Exam  Constitutional: She is well-developed, well-nourished, and in no distress.  Cardiovascular: Normal rate, regular rhythm, S1 normal, S2 normal and normal heart sounds.  No murmur heard. Pulmonary/Chest: She has no decreased breath sounds. She has wheezes in the right middle field, the right lower field, the left middle field and the left lower field.  Musculoskeletal:       Right ankle: She exhibits no swelling.  Left ankle: She exhibits no swelling.  Psychiatric: Mood, memory, affect and judgment normal.  Nursing note and vitals reviewed.   Recent Results (from the past 2160 hour(s))  POCT rapid strep A     Status: Normal   Collection Time: 07/02/17 10:33 AM  Result Value Ref Range   Rapid Strep A Screen Negative Negative  POCT Influenza A/B     Status: Normal   Collection Time: 07/02/17 10:38 AM  Result Value Ref Range   Influenza A, POC Negative Negative   Influenza B, POC Negative Negative     Assessment & Plan  1. Diabetes mellitus without complication (HCC) Point-of-care A1c over 14%, suspect instrument error versus poorly controlled diabetes because of steroid therapy. We will order a lab A1c and adjust pharmacotherapy as appropriate - POCT HgB A1C - dapagliflozin propanediol (FARXIGA) 5 MG TABS tablet; Take 5 mg by mouth every morning. 9 A.M  Dispense: 90 tablet; Refill: 0 - sitaGLIPtin (JANUVIA) 100 MG tablet; Take 1 tablet (100 mg total) by mouth daily.  Dispense: 90 tablet; Refill: 1  2. Need for 23-polyvalent pneumococcal polysaccharide vaccine   3. Anxiety and depression Symptoms of anxiety and depression are stable, referral to psychiatry for management of anxiety and prescription for  alprazolam - ALPRAZolam (XANAX) 0.5 MG tablet; Take 1 tablet (0.5 mg total) by mouth 2 (two) times daily as needed for anxiety.  Dispense: 60 tablet; Refill: 0 - citalopram (CELEXA) 40 MG tablet; Take 1 tablet (40 mg total) by mouth daily.  Dispense: 90 tablet; Refill: 0 - Ambulatory referral to Psychiatry  4. Hyperlipidemia, unspecified hyperlipidemia type  - simvastatin (ZOCOR) 40 MG tablet; Take 1 tablet (40 mg total) by mouth daily at 6 PM.  Dispense: 90 tablet; Refill: 0 - Lipid panel - COMPLETE METABOLIC PANEL WITH GFR  5. Nocturnal cough Refill for albuterol provided, patient recovering from a bronchitis infection - albuterol (PROVENTIL HFA) 108 (90 Base) MCG/ACT inhaler; Inhale 2 puffs into the lungs every 6 (six) hours as needed for wheezing or shortness of breath.  Dispense: 1 Inhaler; Refill: 3   Eilam Shrewsbury [facility] 07/11/2017 11:43 AM

## 2017-07-12 LAB — LIPID PANEL
Cholesterol: 189 mg/dL (ref <200)
HDL: 31 mg/dL — ABNORMAL LOW (ref >50)
LDL Cholesterol (Calc): 135 mg/dL (calc) — ABNORMAL HIGH
Non-HDL Cholesterol (Calc): 158 mg/dL (calc) — ABNORMAL HIGH (ref <130)
Total CHOL/HDL Ratio: 6.1 (calc) — ABNORMAL HIGH (ref <5.0)
Triglycerides: 123 mg/dL (ref <150)

## 2017-07-12 LAB — COMPLETE METABOLIC PANEL WITH GFR
AG Ratio: 1.5 (calc) (ref 1.0–2.5)
ALT: 19 U/L (ref 6–29)
AST: 16 U/L (ref 10–30)
Albumin: 4.3 g/dL (ref 3.6–5.1)
Alkaline phosphatase (APISO): 53 U/L (ref 33–115)
BUN: 10 mg/dL (ref 7–25)
CO2: 25 mmol/L (ref 20–32)
Calcium: 9.5 mg/dL (ref 8.6–10.2)
Chloride: 104 mmol/L (ref 98–110)
Creat: 0.83 mg/dL (ref 0.50–1.10)
GFR, Est African American: 101 mL/min/{1.73_m2} (ref >=60)
GFR, Est Non African American: 87 mL/min/{1.73_m2} (ref >=60)
Globulin: 2.8 g/dL (calc) (ref 1.9–3.7)
Glucose, Bld: 103 mg/dL — ABNORMAL HIGH (ref 65–99)
Potassium: 4.3 mmol/L (ref 3.5–5.3)
Sodium: 137 mmol/L (ref 135–146)
Total Bilirubin: 0.5 mg/dL (ref 0.2–1.2)
Total Protein: 7.1 g/dL (ref 6.1–8.1)

## 2017-07-12 LAB — HEMOGLOBIN A1C
Hgb A1c MFr Bld: 7.3 % of total Hgb — ABNORMAL HIGH (ref <5.7)
Mean Plasma Glucose: 163 (calc)
eAG (mmol/L): 9 (calc)

## 2017-10-01 ENCOUNTER — Other Ambulatory Visit: Payer: Self-pay

## 2017-10-01 DIAGNOSIS — E785 Hyperlipidemia, unspecified: Secondary | ICD-10-CM

## 2017-10-01 NOTE — Telephone Encounter (Signed)
Please contact patient; find out if she is still planning on being a patient here No upcoming appointments Due for visit April 24th or just after Per Dr. Trena Platt last note, she was supposed to have switched from simvastatin to rosuvastatin in his lab result note (blood drawn 07/11/17) She needs an appointment please

## 2017-10-02 MED ORDER — ROSUVASTATIN CALCIUM 10 MG PO TABS: 10.0000 mg | ORAL_TABLET | Freq: Every day | ORAL | 0 refills | Status: DC

## 2017-10-02 NOTE — Telephone Encounter (Signed)
Pt notified still taking simvastatin.  appt made for first available may 27

## 2017-10-02 NOTE — Telephone Encounter (Signed)
Left voice mail

## 2017-10-02 NOTE — Telephone Encounter (Signed)
Have her STOP simvastatin Start Crestor (rosuvastatin) I sent the Rx in We'll rcheck her labs when we see her We'll recommend that she cut down fatty meats and get more fruits and veggies and oatmeal

## 2017-11-13 ENCOUNTER — Ambulatory Visit: Payer: Managed Care, Other (non HMO) | Admitting: Family Medicine

## 2017-12-03 ENCOUNTER — Ambulatory Visit
Admission: RE | Admit: 2017-12-03 | Discharge: 2017-12-03 | Disposition: A | Discharge status: Home / Self Care | Payer: Medicaid Other | Source: Ambulatory Visit | Attending: Nurse Practitioner | Admitting: Nurse Practitioner

## 2017-12-03 ENCOUNTER — Encounter: Payer: Self-pay | Admitting: Nurse Practitioner

## 2017-12-03 ENCOUNTER — Ambulatory Visit: Payer: Managed Care, Other (non HMO) | Admitting: Nurse Practitioner

## 2017-12-03 VITALS — BP 118/72 | HR 100 | Temp 98.7°F | Resp 16 | Ht 63.0 in | Wt 210.6 lb

## 2017-12-03 DIAGNOSIS — R062 Wheezing: Secondary | ICD-10-CM

## 2017-12-03 DIAGNOSIS — E119 Type 2 diabetes mellitus without complications: Secondary | ICD-10-CM

## 2017-12-03 DIAGNOSIS — R0602 Shortness of breath: Secondary | ICD-10-CM | POA: Diagnosis present

## 2017-12-03 DIAGNOSIS — H65191 Other acute nonsuppurative otitis media, right ear: Secondary | ICD-10-CM | POA: Diagnosis not present

## 2017-12-03 DIAGNOSIS — R05 Cough: Secondary | ICD-10-CM

## 2017-12-03 DIAGNOSIS — R059 Cough, unspecified: Secondary | ICD-10-CM

## 2017-12-03 DIAGNOSIS — J3489 Other specified disorders of nose and nasal sinuses: Secondary | ICD-10-CM

## 2017-12-03 MED ORDER — ALBUTEROL SULFATE (2.5 MG/3ML) 0.083% IN NEBU: 2.5000 mg | INHALATION_SOLUTION | Freq: Once | RESPIRATORY_TRACT | Status: DC

## 2017-12-03 MED ORDER — FLUTICASONE PROPIONATE 50 MCG/ACT NA SUSP: 2.0000 | Freq: Every day | NASAL | 2 refills | Status: DC

## 2017-12-03 MED ORDER — AMOXICILLIN-POT CLAVULANATE ER 1000-62.5 MG PO TB12: 2.0000 | ORAL_TABLET | Freq: Two times a day (BID) | ORAL | 0 refills | Status: DC

## 2017-12-03 NOTE — Progress Notes (Addendum)
Name: Lauren [name]   MRN: [id number]    DOB: 1975/12/30   Date:12/03/2017       Progress Note  Subjective  Chief Complaint  Chief Complaint  Patient presents with  . Sinusitis    headache,earache, scratchy throat for 2 days    HPI  Patient sts 2 days had runny nose, sore throat, headache, bilateral ear pain with right ear drainage, facial fullness, sts has no energy. Subjective fevers- sweating.   Denies shortness of breath, cp, body aches.  Patient has tried musinex sinus and cold, and last night took old antibiotic- amoxicillin.   Patient Active Problem List   Diagnosis Date Noted  . Allergic rhinitis, seasonal 04/23/2017  . Genital herpes 04/23/2017  . Diabetes mellitus without complication ([place]) 40/98/1191  . Sinus pressure 01/31/2017  . 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 Bronchitis 03/16/2016  . Cholesteatoma of attic of right ear 12/05/2015  . Nocturnal cough 11/29/2015  . GERD (gastroesophageal reflux disease) 11/29/2015  . Acid reflux 09/19/2015  . Screening for breast cancer 09/19/2015  . Chronic pelvic pain in female 09/19/2015  . Vitamin D deficiency 12/13/2014  . History of herpes genitalis 12/13/2014  . Hyperlipidemia 05/26/2014  . Obesity 05/26/2014  . Smoker 05/26/2014    Past Medical History:  Diagnosis Date  . Allergic rhinitis   . Anxiety   . Chronic depression   . Diabetes mellitus without complication ([place])   . Elevated LFTs   . Genital herpes   . GERD (gastroesophageal reflux disease)   . Hyperlipidemia   . Vitamin D deficiency     Past Surgical History:  Procedure Laterality Date  . APPENDECTOMY    . CESAREAN SECTION     x 4   . COLONOSCOPY    . HERNIA REPAIR  09/25/2011  . TUBAL LIGATION    . TYMPANOMASTOIDECTOMY Right 12/07/2015   Procedure: TYMPANOMASTOIDECTOMY;  Surgeon: Clyde Canterbury, MD;  Location: [facility] ORS;  Service: ENT;  Laterality: Right;    Social History   Tobacco Use  . Smoking  status: Current Every Day Smoker    Packs/day: 0.50    Years: 25.00    Pack years: 12.50    Types: Cigarettes  . Smokeless tobacco: Never Used  Substance Use Topics  . Alcohol use: Yes    Alcohol/week: 0.0 oz    Comment: occasionally= 3 beers per month.     Current Outpatient Medications:  .  albuterol (PROVENTIL HFA) 108 (90 Base) MCG/ACT inhaler, Inhale 2 puffs into the lungs every 6 (six) hours as needed for wheezing or shortness of breath., Disp: 1 Inhaler, Rfl: 3 .  ALPRAZolam (XANAX) 0.5 MG tablet, Take 1 tablet (0.5 mg total) by mouth 2 (two) times daily as needed for anxiety., Disp: 60 tablet, Rfl: 0 .  azelastine (ASTELIN) 0.1 % nasal spray, SPRAY ONCE IN EACH NOSTRIL TWICE DAILY, Disp: , Rfl: 12 .  citalopram (CELEXA) 40 MG tablet, Take 1 tablet (40 mg total) by mouth daily., Disp: 90 tablet, Rfl: 0 .  dapagliflozin propanediol (FARXIGA) 5 MG TABS tablet, Take 5 mg by mouth every morning. 9 A.M, Disp: 90 tablet, Rfl: 0 .  fluticasone (FLONASE) 50 MCG/ACT nasal spray, Place 2 sprays into both nostrils daily., Disp: 16 g, Rfl: 2 .  ranitidine (ZANTAC) 150 MG tablet, TAKE 1 TABLET (150 MG TOTAL) BY MOUTH AT BEDTIME., Disp: 90 tablet, Rfl: 0 .  rosuvastatin (CRESTOR) 10 MG tablet, Take 1 tablet (10 mg total) by  mouth at bedtime. For cholesterol; STOP simvastatin, Disp: 90 tablet, Rfl: 0 .  sitaGLIPtin (JANUVIA) 100 MG tablet, Take 1 tablet (100 mg total) by mouth daily., Disp: 90 tablet, Rfl: 1 .  valACYclovir (VALTREX) 1000 MG tablet, TAKE 1 TABLET (1,000 MG TOTAL) BY MOUTH DAILY., Disp: 90 tablet, Rfl: 3 .  Fluocinonide 0.1 % CREA, as needed. , Disp: , Rfl:   No Known Allergies  ROS  No other specific complaints in a complete review of systems (except as listed in HPI above).  Objective  Vitals:   12/03/17 1020  BP: 118/72  Pulse: 100  Resp: 16  Temp: 98.7 F (37.1 C)  TempSrc: Oral  SpO2: 96%  Weight: 210 lb 9.6 oz (95.5 kg)  Height: 5\' 3"  (1.6 m)     Body  mass index is 37.31 kg/m.  Nursing Note and Vital Signs reviewed.  Physical Exam  Constitutional: Patient appears well-developed and well-nourished. Obese  No distress.  HEENT: head atraumatic, normocephalic, pupils equal and reactive to light, Left TM without erythema or bulging, Right TM, partially visualized- bulging, with notable serous drainage and tenderness on exam,  Bilateral frontal sinus tenderness no maxillary  sinus tenderness , neck supple without lymphadenopathy, oropharynx pink and dry without exudate, scant nasal discharge Cardiovascular: mildly elevatedrate, regular rhythm, S1/S2 present.  Pulses intact  Pulmonary/Chest: Effort normal and breath sounds upper expiratory wheezing bilaterally, still present after coughing and breathing treatment. No respiratory distress or retractions. Psychiatric: Patient has a normal mood and affect. behavior is normal. Judgment and thought content normal.  No results found for this or any previous visit (from the past 72 hour(s)).  Assessment & Plan  1. Sinus pressure -neti pot, antihistamine  - fluticasone (FLONASE) 50 MCG/ACT nasal spray; Place 2 sprays into both nostrils daily.  Dispense: 16 g; Refill: 2 - amoxicillin-clavulanate (AUGMENTIN XR) 1000-62.5 MG 12 hr tablet; Take 2 tablets by mouth 2 (two) times daily.  Dispense: 14 tablet; Refill: 0  2. Diabetes mellitus without complication ([place]) - will set up followup for routine management  - Urine Microalbumin w/creat. ratio  3. Other acute nonsuppurative otitis media of right ear, recurrence not specified  - amoxicillin-clavulanate (AUGMENTIN XR) 1000-62.5 MG 12 hr tablet; Take 2 tablets by mouth 2 (two) times daily.  Dispense: 14 tablet; Refill: 0  4. Wheezing - smoking cessation, albuterol PRN  - albuterol (PROVENTIL) (2.5 MG/3ML) 0.083% nebulizer solution 2.5 mg - DG Chest 2 View; Future  5. Cough - smoking cessation, discussed otc managment - DG Chest 2 View;  Future  -discussed ER precautions, routine visit needed and to come back if not improving within one week  -Red flags and when to present for emergency care or RTC including fever >101.50F, chest pain, shortness of breath, new/worsening/un-resolving symptoms,  reviewed with patient at time of visit. Follow up and care instructions discussed and provided in AVS. -Reviewed Health Maintenance: needs routine office visit   --------------------------------- I have reviewed this encounter including the documentation in this note and/or discussed this patient with the provider, Suezanne Cheshire DNP AGNP-C. I am certifying that I agree with the content of this note as supervising physician. Enid Derry, [facility] 12/03/2017, 5:03 PM

## 2017-12-03 NOTE — Patient Instructions (Addendum)
-   Please pick up your antibiotic today and take your first dose tonight. Take it every 12 hours with some food until it is all gone even if you feel better before it is finished. Please do eat yogurt daily or take a probiotic daily for the next month or two We want to replace the healthy germs in the gut If you notice foul, watery diarrhea in the next two months, schedule an appointment RIGHT AWAY  - Please follow-up with [place] in 2 days to ensure you are improving - please call 911 if you are feeling worse or having chest pain or cant catch your breath - I have also prescribed you an albuterol inhaler you can take if you have chest tightness of wheezing. Take 1-2 puffs every 6 hours as needed.  - Please quit smoking   Otitis Media, Adult Otitis media is redness, soreness, and puffiness (swelling) in the space just behind your eardrum (middle ear). It may be caused by allergies or infection. It often happens along with a cold. Follow these instructions at home:  Take your medicine as told. Finish it even if you start to feel better.  Only take over-the-counter or prescription medicines for pain, discomfort, or fever as told by your doctor.  Follow up with your doctor as told. Contact a doctor if:  You have otitis media only in one ear, or bleeding from your nose, or both.  You notice a lump on your neck.  You are not getting better in 3-5 days.  You feel worse instead of better. Get help right away if:  You have pain that is not helped with medicine.  You have puffiness, redness, or pain around your ear.  You get a stiff neck.  You cannot move part of your face (paralysis).  You notice that the bone behind your ear hurts when you touch it. This information is not intended to replace advice given to you by your health care provider. Make sure you discuss any questions you have with your health care provider. Document Released: 11/21/2007 Document Revised: 11/10/2015 Document  Reviewed: 12/30/2012 Elsevier Interactive Patient Education  2017 [employer].

## 2017-12-04 LAB — MICROALBUMIN / CREATININE URINE RATIO
Creatinine, Urine: 182 mg/dL (ref 20–275)
Microalb Creat Ratio: 7 mcg/mg creat (ref <30)
Microalb, Ur: 1.2 mg/dL

## 2017-12-23 ENCOUNTER — Other Ambulatory Visit: Payer: Self-pay

## 2017-12-23 DIAGNOSIS — F32A Depression, unspecified: Secondary | ICD-10-CM

## 2017-12-23 DIAGNOSIS — F329 Major depressive disorder, single episode, unspecified: Secondary | ICD-10-CM

## 2017-12-23 DIAGNOSIS — F419 Anxiety disorder, unspecified: Principal | ICD-10-CM

## 2017-12-23 MED ORDER — CITALOPRAM HYDROBROMIDE 40 MG PO TABS: 40.0000 mg | ORAL_TABLET | Freq: Every day | ORAL | 0 refills | Status: DC

## 2017-12-25 ENCOUNTER — Ambulatory Visit: Payer: Medicaid Other | Admitting: Family Medicine

## 2017-12-27 ENCOUNTER — Encounter: Payer: Self-pay | Admitting: Nurse Practitioner

## 2017-12-27 ENCOUNTER — Ambulatory Visit: Payer: Medicaid Other | Admitting: Nurse Practitioner

## 2017-12-27 VITALS — BP 122/80 | HR 80 | Temp 98.4°F | Resp 16 | Ht 63.0 in | Wt 210.4 lb

## 2017-12-27 DIAGNOSIS — H66005 Acute suppurative otitis media without spontaneous rupture of ear drum, recurrent, left ear: Secondary | ICD-10-CM | POA: Diagnosis not present

## 2017-12-27 DIAGNOSIS — G8929 Other chronic pain: Secondary | ICD-10-CM | POA: Diagnosis not present

## 2017-12-27 DIAGNOSIS — H9201 Otalgia, right ear: Secondary | ICD-10-CM | POA: Diagnosis not present

## 2017-12-27 DIAGNOSIS — Z23 Encounter for immunization: Secondary | ICD-10-CM

## 2017-12-27 DIAGNOSIS — E1165 Type 2 diabetes mellitus with hyperglycemia: Secondary | ICD-10-CM

## 2017-12-27 LAB — POCT GLYCOSYLATED HEMOGLOBIN (HGB A1C): Hemoglobin A1C: 10.1 % — AB (ref 4.0–5.6)

## 2017-12-27 MED ORDER — METFORMIN HCL ER 500 MG PO TB24: 500.0000 mg | ORAL_TABLET | Freq: Every day | ORAL | 0 refills | Status: DC

## 2017-12-27 MED ORDER — AMOXICILLIN-POT CLAVULANATE ER 1000-62.5 MG PO TB12: 2.0000 | ORAL_TABLET | Freq: Two times a day (BID) | ORAL | 0 refills | Status: DC

## 2017-12-27 MED ORDER — CEFDINIR 300 MG PO CAPS: 300.0000 mg | ORAL_CAPSULE | Freq: Two times a day (BID) | ORAL | 0 refills | Status: AC

## 2017-12-27 NOTE — Progress Notes (Addendum)
Name: Lauren [name]   MRN: [id number]    DOB: September 24, 1975   Date:12/27/2017       Progress Note  Subjective  Chief Complaint  Chief Complaint  Patient presents with  . Ear Pain    She complains of left ear pain x 3 days that comes and goes. She has popping in her ear when she burps or yawns and ear is also sore. She has been using Ibuprofen for pain with fair relief.    HPI  Patient c/o left ear pain x3 days mild relief with ibuprofen, hasn't been swimming, no tinnitus notes some muffled sounds.   Had surgeries in right ear with repaired hole in TM states is having issues with right ear again and would like a referral.  States has been unable to afford her diabetes medications so she has been off them for over a month. States cant tolerate metformin due to GI upset. States has polydipsia, denies polyphagia or polyuria.   Patient Active Problem List   Diagnosis Date Noted  . Allergic rhinitis, seasonal 04/23/2017  . Genital herpes 04/23/2017  . Diabetes mellitus without complication ([place]) 25/42/7062  . Sinus pressure 01/31/2017  . 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 ([place]) 05/17/2016  . Bronchitis 03/16/2016  . Cholesteatoma of attic of right ear 12/05/2015  . Nocturnal cough 11/29/2015  . GERD (gastroesophageal reflux disease) 11/29/2015  . Acid reflux 09/19/2015  . Screening for breast cancer 09/19/2015  . Chronic pelvic pain in female 09/19/2015  . Vitamin D deficiency 12/13/2014  . History of herpes genitalis 12/13/2014  . Hyperlipidemia 05/26/2014  . Obesity 05/26/2014  . Smoker 05/26/2014    Past Medical History:  Diagnosis Date  . Allergic rhinitis   . Anxiety   . Chronic depression   . Diabetes mellitus without complication ([place])   . Elevated LFTs   . Genital herpes   . GERD (gastroesophageal reflux disease)   . Hyperlipidemia   . Vitamin D deficiency     Past Surgical History:  Procedure Laterality Date  . APPENDECTOMY    . CESAREAN SECTION      x 4   . COLONOSCOPY    . HERNIA REPAIR  09/25/2011  . TUBAL LIGATION    . TYMPANOMASTOIDECTOMY Right 12/07/2015   Procedure: TYMPANOMASTOIDECTOMY;  Surgeon: Clyde Canterbury, MD;  Location: [facility] ORS;  Service: ENT;  Laterality: Right;    Social History   Tobacco Use  . Smoking status: Current Every Day Smoker    Packs/day: 0.50    Years: 25.00    Pack years: 12.50    Types: Cigarettes  . Smokeless tobacco: Never Used  Substance Use Topics  . Alcohol use: Yes    Alcohol/week: 0.0 oz    Comment: occasionally= 3 beers per month.     Current Outpatient Medications:  .  albuterol (PROVENTIL HFA) 108 (90 Base) MCG/ACT inhaler, Inhale 2 puffs into the lungs every 6 (six) hours as needed for wheezing or shortness of breath., Disp: 1 Inhaler, Rfl: 3 .  ALPRAZolam (XANAX) 0.5 MG tablet, Take 1 tablet (0.5 mg total) by mouth 2 (two) times daily as needed for anxiety., Disp: 60 tablet, Rfl: 0 .  azelastine (ASTELIN) 0.1 % nasal spray, SPRAY ONCE IN EACH NOSTRIL TWICE DAILY, Disp: , Rfl: 12 .  citalopram (CELEXA) 40 MG tablet, Take 1 tablet (40 mg total) by mouth daily., Disp: 90 tablet, Rfl: 0 .  fluticasone (FLONASE) 50 MCG/ACT nasal spray, Place 2 sprays into both nostrils daily.,  Disp: 16 g, Rfl: 2 .  ranitidine (ZANTAC) 150 MG tablet, TAKE 1 TABLET (150 MG TOTAL) BY MOUTH AT BEDTIME., Disp: 90 tablet, Rfl: 0 .  rosuvastatin (CRESTOR) 10 MG tablet, Take 1 tablet (10 mg total) by mouth at bedtime. For cholesterol; STOP simvastatin, Disp: 90 tablet, Rfl: 0 .  valACYclovir (VALTREX) 1000 MG tablet, TAKE 1 TABLET (1,000 MG TOTAL) BY MOUTH DAILY., Disp: 90 tablet, Rfl: 3 .  amoxicillin-clavulanate (AUGMENTIN XR) 1000-62.5 MG 12 hr tablet, Take 2 tablets by mouth 2 (two) times daily., Disp: 14 tablet, Rfl: 0 .  dapagliflozin propanediol (FARXIGA) 5 MG TABS tablet, Take 5 mg by mouth every morning. 9 A.M (Patient not taking: Reported on 12/27/2017), Disp: 90 tablet, Rfl: 0 .  Fluocinonide 0.1 %  CREA, as needed. , Disp: , Rfl:  .  sitaGLIPtin (JANUVIA) 100 MG tablet, Take 1 tablet (100 mg total) by mouth daily. (Patient not taking: Reported on 12/27/2017), Disp: 90 tablet, Rfl: 1  Current Facility-Administered Medications:  .  albuterol (PROVENTIL) (2.5 MG/3ML) 0.083% nebulizer solution 2.5 mg, 2.5 mg, Nebulization, Once, Hollin Crewe E, NP  No Known Allergies  ROS   No other specific complaints in a complete review of systems (except as listed in HPI above).  Objective  Vitals:   12/27/17 1043  BP: 122/80  Pulse: 80  Resp: 16  Temp: 98.4 F (36.9 C)  TempSrc: Oral  SpO2: 99%  Weight: 210 lb 6.4 oz (95.4 kg)  Height: 5\' 3"  (1.6 m)    Body mass index is 37.27 kg/m.  Nursing Note and Vital Signs reviewed.  Physical Exam   Constitutional: Patient appears well-developed and well-nourished. Obese No distress.  HEENT: head atraumatic, normocephalic, pupils equal and reactive to light, left TM- red and bulging- no serous drainage, right TM difficult to visualized due to dried skin but possible perforation; no maxillary or frontal sinus tenderness , neck supple without lymphadenopathy, oropharynx pink and moist without exudate, no nasal discharge Cardiovascular: Normal rate, regular rhythm, S1/S2 present.   Pulmonary/Chest: Effort normal and breath sounds clear.  Abdominal: Soft and non-tender, bowel sounds present  Psychiatric: Patient has a normal mood and affect. behavior is normal. Judgment and thought content normal. Diabetic Foot Exam - Simple   Simple Foot Form Diabetic Foot exam was performed with the following findings:  Yes 12/27/2017 11:31 AM  Visual Inspection No deformities, no ulcerations, no other skin breakdown bilaterally:  Yes Sensation Testing Intact to touch and monofilament testing bilaterally:  Yes Pulse Check Posterior Tibialis and Dorsalis pulse intact bilaterally:  Yes Comments     No results found for this or any previous visit  (from the past 72 hour(s)).  Assessment & Plan  1. Recurrent acute suppurative otitis media without spontaneous rupture of left tympanic membrane augmentin has not worked for her in the past- last seen and treated for right ear infection on 6/18 with augmentin - cefdinir (OMNICEF) 300 MG capsule; Take 1 capsule (300 mg total) by mouth 2 (two) times daily for 10 days.  Dispense: 20 capsule; Refill: 0  2. Chronic right ear pain - Ambulatory referral to ENT  3. Uncontrolled type 2 diabetes mellitus with hyperglycemia (HCC) A1C>10; discussed tapering up on extended release metformin instead of the 1,000mg  regular one she was started on in the past, discussed diet and will refer to endo  - POCT HgB A1C - metFORMIN (GLUCOPHAGE-XR) 500 MG 24 hr tablet; Take 1 tablet (500 mg total) by mouth daily with breakfast.  Take 1 daily with breakfast for 1 week then twice a week therafter.  Dispense: 90 tablet; Refill: 0 - Ambulatory referral to Endocrinology  4. Need for pneumococcal vaccination Diabetic  - Pneumococcal polysaccharide vaccine 23-valent greater than or equal to 2yo subcutaneous/IM   Face-to-face time with patient was more than 25 minutes, >50% time spent counseling and coordination of care -Red flags and when to present for emergency care or RTC including fever >101.75F, chest pain, shortness of breath, new/worsening/un-resolving symptoms,  reviewed with patient at time of visit. Follow up and care instructions discussed and provided in AVS. -Reviewed Health Maintenance: updated foot exam and received pneumovax  -------------------------------------- I have reviewed this encounter including the documentation in this note and/or discussed this patient with the provider, Suezanne Cheshire DNP AGNP-C. I am certifying that I agree with the content of this note as supervising physician. Enid Derry, [facility] 12/30/2017, 5:30 PM

## 2017-12-27 NOTE — Patient Instructions (Signed)

## 2017-12-30 ENCOUNTER — Other Ambulatory Visit: Payer: Self-pay | Admitting: Nurse Practitioner

## 2017-12-30 DIAGNOSIS — E1165 Type 2 diabetes mellitus with hyperglycemia: Secondary | ICD-10-CM

## 2017-12-30 MED ORDER — METFORMIN HCL ER 500 MG PO TB24: 500.0000 mg | ORAL_TABLET | Freq: Every day | ORAL | 0 refills | Status: DC

## 2018-01-03 ENCOUNTER — Telehealth: Payer: Self-pay | Admitting: Family Medicine

## 2018-01-03 NOTE — Telephone Encounter (Signed)
Copied from [place] [id number]. Topic: Quick Communication - See Telephone Encounter >> Jan 03, 2018  2:57 PM Neva Seat wrote: Pt is currently at the [facility] needing to get her ears cleaned. They are needing a referral or a NPI number to do the procedure. Please call [phone]

## 2018-01-03 NOTE — Telephone Encounter (Signed)
That is fine 

## 2018-01-03 NOTE — Telephone Encounter (Signed)
They told pt she would need to get a referral and schedule an appt

## 2018-01-09 ENCOUNTER — Telehealth: Payer: Self-pay

## 2018-01-09 NOTE — Telephone Encounter (Signed)
Copied from [place] [phone]. Topic: Referral - Status >> Jan 09, 2018 11:33 AM Conception Chancy, NT wrote: Reason for CRM: patient is calling in regards to ENT referral that was placed so the patient could get her ear cleaned out. Patient states she missed her appointment due to having the dates mixed up. They are requiring another referral please advise.

## 2018-01-10 ENCOUNTER — Other Ambulatory Visit: Payer: Self-pay | Admitting: Nurse Practitioner

## 2018-01-10 DIAGNOSIS — G8929 Other chronic pain: Secondary | ICD-10-CM

## 2018-01-10 DIAGNOSIS — H9201 Otalgia, right ear: Principal | ICD-10-CM

## 2018-01-10 NOTE — Telephone Encounter (Signed)
Called patient in regards to referral being resent. Unable to leave vm. If patient calls back please inform her that referral was resent to ENT. Please call ENT office staff to schedule appointment.

## 2018-01-10 NOTE — Telephone Encounter (Signed)
Please call _____ Resent a referral, please communicate with their office staff if you are unable to make an appointment so they can reschedule you per their office policy.

## 2018-01-13 ENCOUNTER — Ambulatory Visit: Payer: Medicaid Other | Admitting: Family Medicine

## 2018-01-13 ENCOUNTER — Encounter: Payer: Self-pay | Admitting: Family Medicine

## 2018-01-13 VITALS — BP 120/64 | HR 89 | Temp 98.3°F | Resp 14 | Ht 63.0 in | Wt 206.9 lb

## 2018-01-13 DIAGNOSIS — E782 Mixed hyperlipidemia: Secondary | ICD-10-CM

## 2018-01-13 DIAGNOSIS — E1165 Type 2 diabetes mellitus with hyperglycemia: Secondary | ICD-10-CM | POA: Diagnosis not present

## 2018-01-13 DIAGNOSIS — F17200 Nicotine dependence, unspecified, uncomplicated: Secondary | ICD-10-CM | POA: Diagnosis not present

## 2018-01-13 LAB — LIPID PANEL
Cholesterol: 143 mg/dL (ref <200)
HDL: 30 mg/dL — ABNORMAL LOW (ref >50)
LDL Cholesterol (Calc): 87 mg/dL (calc)
Non-HDL Cholesterol (Calc): 113 mg/dL (calc) (ref <130)
Total CHOL/HDL Ratio: 4.8 (calc) (ref <5.0)
Triglycerides: 155 mg/dL — ABNORMAL HIGH (ref <150)

## 2018-01-13 LAB — COMPLETE METABOLIC PANEL WITH GFR
AG Ratio: 1.7 (calc) (ref 1.0–2.5)
ALT: 51 U/L — ABNORMAL HIGH (ref 6–29)
AST: 35 U/L — ABNORMAL HIGH (ref 10–30)
Albumin: 4.5 g/dL (ref 3.6–5.1)
Alkaline phosphatase (APISO): 67 U/L (ref 33–115)
BUN: 11 mg/dL (ref 7–25)
CO2: 21 mmol/L (ref 20–32)
Calcium: 9.4 mg/dL (ref 8.6–10.2)
Chloride: 104 mmol/L (ref 98–110)
Creat: 0.72 mg/dL (ref 0.50–1.10)
GFR, Est African American: 120 mL/min/{1.73_m2} (ref >=60)
GFR, Est Non African American: 103 mL/min/{1.73_m2} (ref >=60)
Globulin: 2.6 g/dL (calc) (ref 1.9–3.7)
Glucose, Bld: 236 mg/dL — ABNORMAL HIGH (ref 65–139)
Potassium: 4.1 mmol/L (ref 3.5–5.3)
Sodium: 136 mmol/L (ref 135–146)
Total Bilirubin: 0.4 mg/dL (ref 0.2–1.2)
Total Protein: 7.1 g/dL (ref 6.1–8.1)

## 2018-01-13 NOTE — Patient Instructions (Addendum)
Keep up the good job with weight loss!  I do encourage you to quit smoking Call [phone] to sign up for smoking cessation classes You can call 1-800-QUIT-NOW to talk with a smoking cessation coach Check out the information at familydoctor.org entitled "Nutrition for Weight Loss: What You Need to Know about Fad Diets" Try to lose between 1-2 pounds per week by taking in fewer calories and burning off more calories You can succeed by limiting portions, limiting foods dense in calories and fat, becoming more active, and drinking 8 glasses of water a day (64 ounces) Don't skip meals, especially breakfast, as skipping meals may alter your metabolism Do not use over-the-counter weight loss pills or gimmicks that claim rapid weight loss A healthy BMI (or body mass index) is between 18.5 and 24.9 You can calculate your ideal BMI at the [place] website ClubMonetize.fr   Steps to Quit Smoking Smoking tobacco can be harmful to your health and can affect almost every organ in your body. Smoking puts you, and those around you, at risk for developing many serious chronic diseases. Quitting smoking is difficult, but it is one of the best things that you can do for your health. It is never too late to quit. What are the benefits of quitting smoking? When you quit smoking, you lower your risk of developing serious diseases and conditions, such as:  Lung cancer or lung disease, such as COPD.  Heart disease.  Stroke.  Heart attack.  Infertility.  Osteoporosis and bone fractures.  Additionally, symptoms such as coughing, wheezing, and shortness of breath may get better when you quit. You may also find that you get sick less often because your body is stronger at fighting off colds and infections. If you are pregnant, quitting smoking can help to reduce your chances of having a baby of low birth weight. How do I get ready to quit? When you decide to  quit smoking, create a plan to make sure that you are successful. Before you quit:  Pick a date to quit. Set a date within the next two weeks to give you time to prepare.  Write down the reasons why you are quitting. Keep this list in places where you will see it often, such as on your bathroom mirror or in your car or wallet.  Identify the people, places, things, and activities that make you want to smoke (triggers) and avoid them. Make sure to take these actions: ? Throw away all cigarettes at home, at work, and in your car. ? Throw away smoking accessories, such as Scientist, research (medical). ? Clean your car and make sure to empty the ashtray. ? Clean your home, including curtains and carpets.  Tell your family, friends, and coworkers that you are quitting. Support from your loved ones can make quitting easier.  Talk with your health care provider about your options for quitting smoking.  Find out what treatment options are covered by your health insurance.  What strategies can I use to quit smoking? Talk with your healthcare provider about different strategies to quit smoking. Some strategies include:  Quitting smoking altogether instead of gradually lessening how much you smoke over a period of time. Research shows that quitting "cold [place]" is more successful than gradually quitting.  Attending in-person counseling to help you build problem-solving skills. You are more likely to have success in quitting if you attend several counseling sessions. Even short sessions of 10 minutes can be effective.  Finding resources and support systems that  can help you to quit smoking and remain smoke-free after you quit. These resources are most helpful when you use them often. They can include: ? Online chats with a Social worker. ? Telephone quitlines. ? Careers information officer. ? Support groups or group counseling. ? Text messaging programs. ? Mobile phone applications.  Taking medicines to  help you quit smoking. (If you are pregnant or breastfeeding, talk with your health care provider first.) Some medicines contain nicotine and some do not. Both types of medicines help with cravings, but the medicines that include nicotine help to relieve withdrawal symptoms. Your health care provider may recommend: ? Nicotine patches, gum, or lozenges. ? Nicotine inhalers or sprays. ? Non-nicotine medicine that is taken by mouth.  Talk with your health care provider about combining strategies, such as taking medicines while you are also receiving in-person counseling. Using these two strategies together makes you more likely to succeed in quitting than if you used either strategy on its own. If you are pregnant or breastfeeding, talk with your health care provider about finding counseling or other support strategies to quit smoking. Do not take medicine to help you quit smoking unless told to do so by your health care provider. What things can I do to make it easier to quit? Quitting smoking might feel overwhelming at first, but there is a lot that you can do to make it easier. Take these important actions:  Reach out to your family and friends and ask that they support and encourage you during this time. Call telephone quitlines, reach out to support groups, or work with a counselor for support.  Ask people who smoke to avoid smoking around you.  Avoid places that trigger you to smoke, such as bars, parties, or smoke-break areas at work.  Spend time around people who do not smoke.  Lessen stress in your life, because stress can be a smoking trigger for some people. To lessen stress, try: ? Exercising regularly. ? Deep-breathing exercises. ? Yoga. ? Meditating. ? Performing a body scan. This involves closing your eyes, scanning your body from head to toe, and noticing which parts of your body are particularly tense. Purposefully relax the muscles in those areas.  Download or purchase mobile  phone or tablet apps (applications) that can help you stick to your quit plan by providing reminders, tips, and encouragement. There are many free apps, such as QuitGuide from the [employer] Office manager for Disease Control and Prevention). You can find other support for quitting smoking (smoking cessation) through smokefree.gov and other websites.  How will I feel when I quit smoking? Within the first 24 hours of quitting smoking, you may start to feel some withdrawal symptoms. These symptoms are usually most noticeable 2-3 days after quitting, but they usually do not last beyond 2-3 weeks. Changes or symptoms that you might experience include:  Mood swings.  Restlessness, anxiety, or irritation.  Difficulty concentrating.  Dizziness.  Strong cravings for sugary foods in addition to nicotine.  Mild weight gain.  Constipation.  Nausea.  Coughing or a sore throat.  Changes in how your medicines work in your body.  A depressed mood.  Difficulty sleeping (insomnia).  After the first 2-3 weeks of quitting, you may start to notice more positive results, such as:  Improved sense of smell and taste.  Decreased coughing and sore throat.  Slower heart rate.  Lower blood pressure.  Clearer skin.  The ability to breathe more easily.  Fewer sick days.  Quitting smoking is  very challenging for most people. Do not get discouraged if you are not successful the first time. Some people need to make many attempts to quit before they achieve long-term success. Do your best to stick to your quit plan, and talk with your health care provider if you have any questions or concerns. This information is not intended to replace advice given to you by your health care provider. Make sure you discuss any questions you have with your health care provider. Document Released: 05/29/2001 Document Revised: 01/31/2016 Document Reviewed: 10/19/2014 Elsevier Interactive Patient Education  2018 [place]  Preventing Unhealthy [employer], Adult Staying at a healthy weight is important. When fat builds up in your body, you may become overweight or obese. These conditions put you at greater risk for developing certain health problems, such as heart disease, diabetes, sleeping problems, joint problems, and some cancers. Unhealthy weight gain is often the result of making unhealthy choices in what you eat. It is also a result of not getting enough exercise. You can make changes to your lifestyle to prevent obesity and stay as healthy as possible. What nutrition changes can be made? To maintain a healthy weight and prevent obesity:  Eat only as much as your body needs. To do this: ? Pay attention to signs that you are hungry or full. Stop eating as soon as you feel full. ? If you feel hungry, try drinking water first. Drink enough water so your urine is clear or pale yellow. ? Eat smaller portions. ? Look at serving sizes on food labels. Most foods contain more than one serving per container. ? Eat the recommended amount of calories for your gender and activity level. While most active people should eat around 2,000 calories per day, if you are trying to lose weight or are not very active, you main need to eat less calories. Talk to your health care provider or dietitian about how many calories you should eat each day.  Choose healthy foods, such as: ? Fruits and vegetables. Try to fill at least half of your plate at each meal with fruits and vegetables. ? Whole grains, such as whole wheat bread, brown rice, and quinoa. ? Lean meats, such as chicken or fish. ? Other healthy proteins, such as beans, eggs, or tofu. ? Healthy fats, such as nuts, seeds, fatty fish, and olive oil. ? Low-fat or fat-free dairy.  Check food labels and avoid food and drinks that: ? Are high in calories. ? Have added sugar. ? Are high in sodium. ? Have saturated fats or trans fats.  Limit how much you eat of the  following foods: ? Prepackaged meals. ? Fast food. ? Fried foods. ? Processed meat, such as bacon, sausage, and deli meats. ? Fatty cuts of red meat and poultry with skin.  Cook foods in healthier ways, such as by baking, broiling, or grilling.  When grocery shopping, try to shop around the outside of the store. This helps you buy mostly fresh foods and avoid canned and prepackaged foods.  What lifestyle changes can be made?  Exercise at least 30 minutes 5 or more days each week. Exercising includes brisk walking, yard work, biking, running, swimming, and team sports like basketball and soccer. Ask your health care provider which exercises are safe for you.  Do not use any products that contain nicotine or tobacco, such as cigarettes and e-cigarettes. If you need help quitting, ask your health care provider.  Limit alcohol intake to no more  than 1 drink a day for nonpregnant women and 2 drinks a day for men. One drink equals 12 oz of beer, 5 oz of wine, or 1 oz of hard liquor.  Try to get 7-9 hours of sleep each night. What other changes can be made?  Keep a food and activity journal to keep track of: ? What you ate and how many calories you had. Remember to count sauces, dressings, and side dishes. ? Whether you were active, and what exercises you did. ? Your calorie, weight, and activity goals.  Check your weight regularly. Track any changes. If you notice you have gained weight, make changes to your diet or activity routine.  Avoid taking weight-loss medicines or supplements. Talk to your health care provider before starting any new medicine or supplement.  Talk to your health care provider before trying any new diet or exercise plan. Why are these changes important? Eating healthy, staying active, and having healthy habits not only help prevent obesity, they also:  Help you to manage stress and emotions.  Help you to connect with friends and family.  Improve your  self-esteem.  Improve your sleep.  Prevent long-term health problems.  What can happen if changes are not made? Being obese or overweight can cause you to develop joint or bone problems, which can make it hard for you to stay active or do activities you enjoy. Being obese or overweight also puts stress on your heart and lungs and can lead to health problems like diabetes, heart disease, and some cancers. Where to find more information: Talk with your health care provider or a dietitian about healthy eating and healthy lifestyle choices. You may also find other information through these resources:  U.S. Department of Agriculture MyPlate: FormerBoss.no  American Heart Association: www.heart.org  Centers for Disease Control and Prevention: http://www.wolf.info/  Summary  Staying at a healthy weight is important. It helps prevent certain diseases and health problems, such as heart disease, diabetes, joint problems, sleep disorders, and some cancers.  Being obese or overweight can cause you to develop joint or bone problems, which can make it hard for you to stay active or do activities you enjoy.  You can prevent unhealthy weight gain by eating a healthy diet, exercising regularly, not smoking, limiting alcohol, and getting enough sleep.  Talk with your health care provider or a dietitian for guidance about healthy eating and healthy lifestyle choices. This information is not intended to replace advice given to you by your health care provider. Make sure you discuss any questions you have with your health care provider. Document Released: 06/05/2016 Document Revised: 07/11/2016 Document Reviewed: 07/11/2016 Elsevier Interactive Patient Education  [employer].

## 2018-01-13 NOTE — Assessment & Plan Note (Signed)
Limit fatty meats; try to lose weight

## 2018-01-13 NOTE — Assessment & Plan Note (Signed)
Encouraged smoking cessation; she is not ready; I am here to help if and when she is ready; she did not tolerate Chantix (bad dream)

## 2018-01-13 NOTE — Assessment & Plan Note (Signed)
Seeing endo; encouragement given for weight loss, better eating; foot exam by MD today

## 2018-01-13 NOTE — Assessment & Plan Note (Signed)
Praise given for weight loss 

## 2018-01-13 NOTE — Progress Notes (Signed)
BP 120/64   Pulse 89   Temp 98.3 F (36.8 C) (Oral)   Resp 14   Ht 5\' 3"  (1.6 m)   Wt 206 lb 14.4 oz (93.8 kg)   LMP 12/19/2017   SpO2 99%   BMI 36.65 kg/m    Subjective:    Patient ID: Lauren [name], female    DOB: 02/04/1976, 42 y.o.   MRN: [id number]  HPI: Lauren [name] is a 42 y.o. female  Chief Complaint  Patient presents with  . Follow-up    HPI Patient is here for f/u; last visit was 12/27/17  She has type 2 diabetes Saw the endocrinologist Back on farxiga and januvia Checking FSBS twice a day In the last two weeks: highest 424, lowest 196; they are trending down No new problems with feet Does try to limit sugary drinks Honey wheat bread; was limiting carbs, trying Urine microalb:Cr normal in June Lab Results  Component Value Date   HGBA1C 10.1 (A) 12/27/2017    Cholesterol; low HDL and high LDL Some cheeseburgers; not much bacon or sausage, just once a month; likes cheese Lab Results  Component Value Date   CHOL 189 07/11/2017   HDL 31 (L) 07/11/2017   LDLCALC 135 (H) 07/11/2017   TRIG 123 07/11/2017   CHOLHDL 6.1 (H) 07/11/2017   Smoking; still smokes 1 ppd; not ready to quit just yet; took Chantix but had bad dreams  Vitamin D deficiency; feeling some fatigue last level was 24; she is getting plenty of sun, out by the pool  Depression screen [facility] 2/9 01/13/2018 07/11/2017 07/02/2017 04/23/2017 01/31/2017  Decreased Interest 0 0 0 0 0  Down, Depressed, Hopeless 3 0 0 0 0  PHQ - 2 Score 3 0 0 0 0  Altered sleeping 3 - - - -  Tired, decreased energy 0 - - - -  Change in appetite 0 - - - -  Feeling bad or failure about yourself  0 - - - -  Trouble concentrating 0 - - - -  Moving slowly or fidgety/restless 0 - - - -  Suicidal thoughts 0 - - - -  PHQ-9 Score 6 - - - -  Difficult doing work/chores Not difficult at all - - - -    Relevant past medical, surgical, family and social history reviewed Past Medical History:  Diagnosis Date  . Allergic  rhinitis   . Anxiety   . Chronic depression   . Diabetes mellitus without complication ([place])   . Elevated LFTs   . Genital herpes   . GERD (gastroesophageal reflux disease)   . Hyperlipidemia   . Vitamin D deficiency    Past Surgical History:  Procedure Laterality Date  . APPENDECTOMY    . CESAREAN SECTION     x 4   . COLONOSCOPY    . HERNIA REPAIR  09/25/2011  . TUBAL LIGATION    . TYMPANOMASTOIDECTOMY Right 12/07/2015   Procedure: TYMPANOMASTOIDECTOMY;  Surgeon: Clyde Canterbury, MD;  Location: [facility] ORS;  Service: ENT;  Laterality: Right;   Family History  Problem Relation Age of Onset  . Hypertension Mother   . Hyperlipidemia Mother   . Arrhythmia Father 67       A-fib  . Alcohol abuse Father   . Heart disease Brother 43       stent x 1   . Alcoholism Brother   . Asthma Son   . Asperger's syndrome Son    Social History  Tobacco Use  . Smoking status: Current Every Day Smoker    Packs/day: 0.50    Years: 25.00    Pack years: 12.50    Types: Cigarettes  . Smokeless tobacco: Never Used  Substance Use Topics  . Alcohol use: Yes    Alcohol/week: 0.0 oz    Comment: occasionally= 3 beers per month.  . Drug use: No    Interim medical history since last visit reviewed. Allergies and medications reviewed  Review of Systems Per HPI unless specifically indicated above     Objective:    BP 120/64   Pulse 89   Temp 98.3 F (36.8 C) (Oral)   Resp 14   Ht 5\' 3"  (1.6 m)   Wt 206 lb 14.4 oz (93.8 kg)   LMP 12/19/2017   SpO2 99%   BMI 36.65 kg/m   Wt Readings from Last 3 Encounters:  01/13/18 206 lb 14.4 oz (93.8 kg)  12/27/17 210 lb 6.4 oz (95.4 kg)  12/03/17 210 lb 9.6 oz (95.5 kg)    Physical Exam  Constitutional: She appears well-developed and well-nourished. No distress.  Weight loss down, acknowledged 3.5 pounds  HENT:  Head: Normocephalic and atraumatic.  Eyes: EOM are normal. No scleral icterus.  Neck: No thyromegaly present.  Cardiovascular:  Normal rate, regular rhythm and normal heart sounds.  No murmur heard. Pulmonary/Chest: Effort normal and breath sounds normal. No respiratory distress. She has no wheezes.  Abdominal: Soft. Bowel sounds are normal. She exhibits no distension.  Musculoskeletal: Normal range of motion. She exhibits no edema.  Neurological: She is alert. She exhibits normal muscle tone.  Skin: Skin is warm and dry. She is not diaphoretic. No pallor.  Psychiatric: She has a normal mood and affect. Her behavior is normal. Judgment and thought content normal.   Diabetic Foot Form - Detailed   Diabetic Foot Exam - detailed Diabetic Foot exam was performed with the following findings:  Yes 01/13/2018 10:05 AM  Visual Foot Exam completed.:  Yes  Pulse Foot Exam completed.:  Yes  Right Dorsalis Pedis:  Present Left Dorsalis Pedis:  Present  Sensory Foot Exam Completed.:  Yes Semmes-Weinstein Monofilament Test R Site 1-Great Toe:  Pos L Site 1-Great Toe:  Pos        Results for orders placed or performed in visit on 12/27/17  POCT HgB A1C  Result Value Ref Range   Hemoglobin A1C 10.1 (A) 4.0 - 5.6 %   HbA1c POC (<> result, manual entry)  4.0 - 5.6 %   HbA1c, POC (prediabetic range)  5.7 - 6.4 %   HbA1c, POC (controlled diabetic range)  0.0 - 7.0 %      Assessment & Plan:   Problem List Items Addressed This Visit      Endocrine   Type 2 diabetes mellitus, uncontrolled ([place]) - Primary    Seeing endo; encouragement given for weight loss, better eating; foot exam by MD today      Relevant Medications   lisinopril (PRINIVIL,ZESTRIL) 20 MG tablet   Other Relevant Orders   Ambulatory referral to Ophthalmology   Ambulatory referral to diabetic education   COMPLETE METABOLIC PANEL WITH GFR     Other   Smoker    Encouraged smoking cessation; she is not ready; I am here to help if and when she is ready; she did not tolerate Chantix (bad dream)      Morbid obesity ([place])    BMI > 35 plus diabetes and  hyperlipidemia; encouragement  given for weigh tloss      Hyperlipidemia    Limit fatty meats; try to lose weight      Relevant Medications   lisinopril (PRINIVIL,ZESTRIL) 20 MG tablet   Other Relevant Orders   Lipid panel       Follow up plan: Return in about 3 months (around 04/15/2018) for follow-up visit with Dr. Sanda Klein.  An after-visit summary was printed and given to the patient at [place].  Please see the patient instructions which may contain other information and recommendations beyond what is mentioned above in the assessment and plan.  No orders of the defined types were placed in this encounter.   Orders Placed This Encounter  Procedures  . COMPLETE METABOLIC PANEL WITH GFR  . Lipid panel  . Ambulatory referral to Ophthalmology  . Ambulatory referral to diabetic education

## 2018-01-13 NOTE — Assessment & Plan Note (Signed)
BMI > 35 plus diabetes and hyperlipidemia; encouragement given for weigh tloss

## 2018-01-21 ENCOUNTER — Other Ambulatory Visit: Payer: Self-pay | Admitting: Nurse Practitioner

## 2018-01-21 DIAGNOSIS — E1165 Type 2 diabetes mellitus with hyperglycemia: Secondary | ICD-10-CM

## 2018-02-04 ENCOUNTER — Ambulatory Visit: Payer: Medicaid Other | Admitting: *Deleted

## 2018-02-07 ENCOUNTER — Encounter: Payer: Self-pay | Admitting: *Deleted

## 2018-02-07 ENCOUNTER — Ambulatory Visit: Payer: Self-pay | Admitting: *Deleted

## 2018-02-07 ENCOUNTER — Encounter: Payer: Medicaid Other | Attending: Family Medicine | Admitting: *Deleted

## 2018-02-07 VITALS — BP 100/60 | Ht 62.0 in | Wt 201.0 lb

## 2018-02-07 DIAGNOSIS — E1165 Type 2 diabetes mellitus with hyperglycemia: Secondary | ICD-10-CM | POA: Diagnosis not present

## 2018-02-07 DIAGNOSIS — Z713 Dietary counseling and surveillance: Secondary | ICD-10-CM | POA: Diagnosis present

## 2018-02-07 DIAGNOSIS — E119 Type 2 diabetes mellitus without complications: Secondary | ICD-10-CM

## 2018-02-07 NOTE — Telephone Encounter (Signed)
Thank you, agree with ER eval

## 2018-02-07 NOTE — Telephone Encounter (Signed)
Pt calling stating that she has experienced bloody stools that that were red in color that turned to black stools for 3 days. Pt states that the last stool she had on yesterday was black and formed.Pt states that she has not had any stools today. Pt states that on yesterday she had about 4 stools that turned the toilet water red prior to stool turning black in color.Pt states she has experienced stomach cramps but denies any dizziness or other symptoms at this time. Pt states the only new medication she has used was Preparation H. Pt advised to seek care in the ED at this time. Pt verbalized understanding.   Reason for Disposition . Tarry or jet black-colored stool (not dark green)  Answer Assessment - Initial Assessment Questions 1. APPEARANCE of BLOOD: "What color is it?" "Is it passed separately, on the surface of the stool, or mixed in with the stool?"      Yesterday was red in color and then had formed black stool 2. AMOUNT: "How much blood was passed?"      Red colored water 3. FREQUENCY: "How many times has blood been passed with the stools?"      None today but about 4 times 4. ONSET: "When was the blood first seen in the stools?" (Days or weeks)      3 days ago 5. DIARRHEA: "Is there also some diarrhea?" If so, ask: "How many diarrhea stools were passed in past 24 hours?"      No 6. CONSTIPATION: "Do you have constipation?" If so, "How bad is it?"     No 7. RECURRENT SYMPTOMS: "Have you had blood in your stools before?" If so, ask: "When was the last time?" and "What happened that time?"      No 8. BLOOD THINNERS: "Do you take any blood thinners?" (e.g., Coumadin/warfarin, Pradaxa/dabigatran, aspirin)     No 9. OTHER SYMPTOMS: "Do you have any other symptoms?"  (e.g., abdominal pain, vomiting, dizziness, fever)     No 10. PREGNANCY: "Is there any chance you are pregnant?" "When was your last menstrual period?"       No  Protocols used: RECTAL BLEEDING-A-AH

## 2018-02-07 NOTE — Patient Instructions (Signed)
Check blood sugars 1 x day before breakfast or 2 hrs after supper every day Bring blood sugar records to the next class  Exercise: Continue walking treadmill for   30  minutes   3 days a week and gradually increase to 30 minutes 5 x week  Eat 3 meals day, 2 snacks a day Space meals 4-6 hours apart Limit fried foods Include a serving of protein with breakfast Avoid sugar sweetened drinks (soda, tea)  Return for classes on:

## 2018-02-07 NOTE — Progress Notes (Signed)
Diabetes Self-Management Education  Visit Type: First/Initial  Appt. Start Time: 0845 Appt. End Time: 0940  02/07/2018  [name], identified by name and date of birth, is a 42 y.o. female with a diagnosis of Diabetes: Type 2.   ASSESSMENT  Blood pressure 100/60, height _0  (1.575 m), weight 201 lb (91.2 kg). Body mass index is 36.76 kg/m.  Diabetes Self-Management Education - 02/07/18 1221      Visit Information   Visit Type  First/Initial      Initial Visit   Diabetes Type  Type 2    Are you currently following a meal plan?  No    Are you taking your medications as prescribed?  Yes    Date Diagnosed  5 years ago      Health Coping   How would you rate your overall health?  Good      Psychosocial Assessment   Patient Belief/Attitude about Diabetes  Other (comment)   "tired"   Self-care barriers  None    Self-management support  Doctor's office    Patient Concerns  Nutrition/Meal planning;Glycemic Control;Weight Control    Special Needs  None    Preferred Learning Style  Visual;Auditory    Learning Readiness  Ready    How often do you need to have someone help you when you read instructions, pamphlets, or other written materials from your doctor or pharmacy?  1 - Never    What is the last grade level you completed in school?  GED      Pre-Education Assessment   Patient understands the diabetes disease and treatment process.  Needs Instruction    Patient understands incorporating nutritional management into lifestyle.  Needs Instruction    Patient undertands incorporating physical activity into lifestyle.  Needs Review    Patient understands using medications safely.  Needs Instruction    Patient understands monitoring blood glucose, interpreting and using results  Needs Review    Patient understands prevention, detection, and treatment of acute complications.  Needs Instruction    Patient understands prevention, detection, and treatment of chronic  complications.  Needs Review    Patient understands how to develop strategies to address psychosocial issues.  Needs Instruction    Patient understands how to develop strategies to promote health/change behavior.  Needs Instruction      Complications   Last HgB A1C per patient/outside source  10.1 %   12/27/17   How often do you check your blood sugar?  1-2 times/day    Fasting Blood glucose range (mg/dL)  70-129;130-179   Pt reports FBG's 120-150's mg/dL   Have you had a dilated eye exam in the past 12 months?  Yes    Have you had a dental exam in the past 12 months?  Yes    Are you checking your feet?  No      Dietary Intake   Breakfast  toast, cereal, muffin, bagels    Snack (morning)  peanut butter crackers    Lunch  [place] sandwich, [employer] chicken with mac-n-cheese and occasional biscuit    Snack (afternoon)  fruit, pudding    Dinner  [place] and cheese sandwich, pork chop, beef, fish, chicken, potatoes, corn, green beans, pasta, rice, salads, broccoli    Beverage(s)  water, regular soda, sugar sweetened tea, diet soda      Exercise   Exercise Type  Light (walking / raking leaves)    How many days per week to you exercise?  3  How many minutes per day do you exercise?  30    Total minutes per week of exercise  90      Patient Education   Previous Diabetes Education  --   2004 for Gestational Diabetes   Disease state   Definition of diabetes, type 1 and 2, and the diagnosis of diabetes    Nutrition management   Role of diet in the treatment of diabetes and the relationship between the three main macronutrients and blood glucose level;Reviewed blood glucose goals for pre and post meals and how to evaluate the patients' food intake on their blood glucose level.    Physical activity and exercise   Role of exercise on diabetes management, blood pressure control and cardiac health.    Medications  Reviewed patients medication for diabetes, action, purpose, timing of dose and side  effects.    Monitoring  Purpose and frequency of SMBG.;Taught/discussed recording of test results and interpretation of SMBG.;Identified appropriate SMBG and/or A1C goals.    Chronic complications  Relationship between chronic complications and blood glucose control    Psychosocial adjustment  Identified and addressed patients feelings and concerns about diabetes      Individualized Goals (developed by patient)   Reducing Risk Improve blood sugars Lose weight     Outcomes   Expected Outcomes  Demonstrated interest in learning. Expect positive outcomes    Future DMSE  2 wks       Individualized Plan for Diabetes Self-Management Training:   Learning Objective:  Patient will have a greater understanding of diabetes self-management. Patient education plan is to attend individual and/or group sessions per assessed needs and concerns.   Plan:   Patient Instructions  Check blood sugars 1 x day before breakfast or 2 hrs after supper every day Bring blood sugar records to the next class Exercise: Continue walking treadmill for   30  minutes   3 days a week and gradually increase to 30 minutes 5 x week Eat 3 meals day, 2 snacks a day Space meals 4-6 hours apart Limit fried foods Include a serving of protein with breakfast Avoid sugar sweetened drinks (soda, tea)  Expected Outcomes:  Demonstrated interest in learning. Expect positive outcomes  Education material provided:  General Meal Planning Guidelines Simple Meal Plan  If problems or questions, patient to contact team via:  Johny Drilling, RN, [place], CDE [phone]  Future DSME appointment: 2 wks  February 24, 2017 for Diabetes Class 1

## 2018-02-08 ENCOUNTER — Other Ambulatory Visit: Payer: Self-pay

## 2018-02-08 ENCOUNTER — Emergency Department
Admission: EM | Admit: 2018-02-08 | Discharge: 2018-02-08 | Disposition: A | Discharge status: Home / Self Care | Payer: Medicaid Other | Attending: Emergency Medicine | Admitting: Emergency Medicine

## 2018-02-08 ENCOUNTER — Encounter: Payer: Self-pay | Admitting: Emergency Medicine

## 2018-02-08 DIAGNOSIS — E119 Type 2 diabetes mellitus without complications: Secondary | ICD-10-CM | POA: Diagnosis not present

## 2018-02-08 DIAGNOSIS — F329 Major depressive disorder, single episode, unspecified: Secondary | ICD-10-CM | POA: Insufficient documentation

## 2018-02-08 DIAGNOSIS — Z713 Dietary counseling and surveillance: Secondary | ICD-10-CM | POA: Diagnosis not present

## 2018-02-08 DIAGNOSIS — F419 Anxiety disorder, unspecified: Secondary | ICD-10-CM | POA: Diagnosis not present

## 2018-02-08 DIAGNOSIS — F17210 Nicotine dependence, cigarettes, uncomplicated: Secondary | ICD-10-CM | POA: Insufficient documentation

## 2018-02-08 DIAGNOSIS — Z7984 Long term (current) use of oral hypoglycemic drugs: Secondary | ICD-10-CM | POA: Insufficient documentation

## 2018-02-08 DIAGNOSIS — Z79899 Other long term (current) drug therapy: Secondary | ICD-10-CM | POA: Insufficient documentation

## 2018-02-08 DIAGNOSIS — K625 Hemorrhage of anus and rectum: Secondary | ICD-10-CM

## 2018-02-08 LAB — COMPREHENSIVE METABOLIC PANEL
ALT: 33 U/L (ref 0–44)
AST: 25 U/L (ref 15–41)
Albumin: 4.6 g/dL (ref 3.5–5.0)
Alkaline Phosphatase: 67 U/L (ref 38–126)
Anion gap: 7 (ref 5–15)
BUN: 15 mg/dL (ref 6–20)
CO2: 24 mmol/L (ref 22–32)
Calcium: 9.6 mg/dL (ref 8.9–10.3)
Chloride: 101 mmol/L (ref 98–111)
Creatinine, Ser: 1.24 mg/dL — ABNORMAL HIGH (ref 0.44–1.00)
GFR calc Af Amer: >60 mL/min (ref >60)
GFR calc non Af Amer: 53 mL/min — ABNORMAL LOW (ref >60)
Glucose, Bld: 176 mg/dL — ABNORMAL HIGH (ref 70–99)
Potassium: 4.6 mmol/L (ref 3.5–5.1)
Sodium: 132 mmol/L — ABNORMAL LOW (ref 135–145)
Total Bilirubin: 0.5 mg/dL (ref 0.3–1.2)
Total Protein: 8 g/dL (ref 6.5–8.1)

## 2018-02-08 LAB — CBC
HCT: 47.8 % — ABNORMAL HIGH (ref 35.0–47.0)
Hemoglobin: 16.1 g/dL — ABNORMAL HIGH (ref 12.0–16.0)
MCH: 31 pg (ref 26.0–34.0)
MCHC: 33.7 g/dL (ref 32.0–36.0)
MCV: 92 fL (ref 80.0–100.0)
Platelets: 231 10*3/uL (ref 150–440)
RBC: 5.2 MIL/uL (ref 3.80–5.20)
RDW: 13.7 % (ref 11.5–14.5)
WBC: 10.8 10*3/uL (ref 3.6–11.0)

## 2018-02-08 MED ORDER — DOCUSATE SODIUM 100 MG PO CAPS: 100.0000 mg | ORAL_CAPSULE | Freq: Two times a day (BID) | ORAL | 2 refills | Status: AC

## 2018-02-08 MED ORDER — HYDROCORTISONE ACETATE 25 MG RE SUPP: 25.0000 mg | Freq: Two times a day (BID) | RECTAL | 1 refills | Status: AC

## 2018-02-08 NOTE — ED Provider Notes (Signed)
[facility] Emergency Department Provider Note  Time seen: 4:18 PM  I have reviewed the triage vital signs and the nursing notes.   HISTORY  Chief Complaint Rectal Bleeding    HPI Lauren [name] is a 42 y.o. female with a past medical history of anxiety, depression, gastric reflux,presents to the emergency department for rectal bleeding.  According to the patient for the past 4 days she has been experiencing blood in the toilet after and during having a bowel movement.  States she is experiencing sharp pain upon initiation of the bowel movement at times.  Has blood on the toilet paper and at times in the toilet.  Also states darker appearing stool.  Patient states several years ago she had an anal tear and had a colonoscopy performed at that time that was otherwise normal per patient.  Does not recall the name of the gastroenterologist.  Denies any nausea or vomiting.  Denies any abdominal pain.  No fever.  Largely negative review of systems otherwise.   Past Medical History:  Diagnosis Date  . Allergic rhinitis   . Anxiety   . Chronic depression   . Diabetes mellitus without complication ([place])   . Elevated LFTs   . Genital herpes   . GERD (gastroesophageal reflux disease)   . Hyperlipidemia   . Vitamin D deficiency     Patient Active Problem List   Diagnosis Date Noted  . Morbid obesity ([place]) 01/13/2018  . Allergic rhinitis, seasonal 04/23/2017  . Genital herpes 04/23/2017  . Type 2 diabetes mellitus, uncontrolled ([place]) 04/23/2017  . 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 Bronchitis 03/16/2016  . Cholesteatoma of attic of right ear 12/05/2015  . Nocturnal cough 11/29/2015  . GERD (gastroesophageal reflux disease) 11/29/2015  . Acid reflux 09/19/2015  . Screening for breast cancer 09/19/2015  . Chronic pelvic pain in female 09/19/2015  . Vitamin D deficiency 12/13/2014  . History of herpes genitalis 12/13/2014  .  Hyperlipidemia 05/26/2014  . Smoker 05/26/2014    Past Surgical History:  Procedure Laterality Date  . APPENDECTOMY    . CESAREAN SECTION     x 4   . COLONOSCOPY    . HERNIA REPAIR  09/25/2011  . TUBAL LIGATION    . TYMPANOMASTOIDECTOMY Right 12/07/2015   Procedure: TYMPANOMASTOIDECTOMY;  Surgeon: Clyde Canterbury, MD;  Location: [facility] ORS;  Service: ENT;  Laterality: Right;    Prior to Admission medications   Medication Sig Start Date End Date Taking? Authorizing Provider  albuterol (PROVENTIL HFA) 108 (90 Base) MCG/ACT inhaler Inhale 2 puffs into the lungs every 6 (six) hours as needed for wheezing or shortness of breath. 07/11/17   Roselee Nova, MD  ALPRAZolam Duanne Moron) 0.5 MG tablet Take 1 tablet (0.5 mg total) by mouth 2 (two) times daily as needed for anxiety. 07/11/17   Roselee Nova, MD  citalopram (CELEXA) 40 MG tablet Take 1 tablet (40 mg total) by mouth daily. 12/23/17   Arnetha Courser, MD  dapagliflozin propanediol (FARXIGA) 10 MG TABS tablet Take 10 mg by mouth daily.    [provider]  fluticasone (FLONASE) 50 MCG/ACT nasal spray Place 2 sprays into both nostrils daily. 12/03/17   Poulose, Bethel Born, NP  lisinopril (PRINIVIL,ZESTRIL) 20 MG tablet Take 20 mg by mouth daily. 01/06/18   [provider]  metFORMIN (GLUCOPHAGE-XR) 500 MG 24 hr tablet TAKE 1 TABLET BY MOUTH DAILY WITH BREAKFAST FOR 1 WEEK THEN  TWICE A DAY THERAFTER. 01/21/18   Poulose, Bethel Born, NP  ranitidine (ZANTAC) 150 MG tablet TAKE 1 TABLET (150 MG TOTAL) BY MOUTH AT BEDTIME. 03/06/17   Roselee Nova, MD  rosuvastatin (CRESTOR) 10 MG tablet Take 1 tablet (10 mg total) by mouth at bedtime. For cholesterol; STOP simvastatin 10/02/17   Lada, Satira Anis, MD  sitaGLIPtin (JANUVIA) 100 MG tablet Take 100 mg by mouth daily.    [provider]  valACYclovir (VALTREX) 1000 MG tablet TAKE 1 TABLET (1,000 MG TOTAL) BY MOUTH DAILY. 04/04/17   Roselee Nova, MD    No Known  Allergies  Family History  Problem Relation Age of Onset  . Hypertension Mother   . Hyperlipidemia Mother   . Diabetes Mother   . Arrhythmia Father 60       A-fib  . Alcohol abuse Father   . Heart disease Brother 43       stent x 1   . Alcoholism Brother   . Asthma Son   . Asperger's syndrome Son     Social History Social History   Tobacco Use  . Smoking status: Current Every Day Smoker    Packs/day: 1.00    Years: 28.00    Pack years: 28.00    Types: Cigarettes  . Smokeless tobacco: Never Used  Substance Use Topics  . Alcohol use: Yes    Alcohol/week: 0.0 standard drinks    Comment: occasionally= 3 beers per month.  . Drug use: No    Review of Systems Constitutional: Negative for fever. Cardiovascular: Negative for chest pain. Respiratory: Negative for shortness of breath. Gastrointestinal: Negative for abdominal pain, vomiting.  Positive for blood in her stool and toilet. Genitourinary: Negative for urinary compaints Musculoskeletal: Negative for musculoskeletal complaints Skin: Negative for skin complaints  Neurological: Negative for headache All other ROS negative  ____________________________________________   PHYSICAL EXAM:  VITAL SIGNS: ED Triage Vitals  Enc Vitals Group     BP 02/08/18 1233 112/63     Pulse Rate 02/08/18 1233 88     Resp 02/08/18 1233 16     Temp 02/08/18 1233 99 F (37.2 C)     Temp Source 02/08/18 1233 Oral     SpO2 02/08/18 1233 98 %     Weight --      Height --      Head Circumference --      Peak Flow --      Pain Score 02/08/18 1245 7     Pain Loc --      Pain Edu? --      Excl. in [place]? --    Constitutional: Alert and oriented. Well appearing and in no distress. Eyes: Normal exam ENT   Head: Normocephalic and atraumatic.   Mouth/Throat: Mucous membranes are moist. Cardiovascular: Normal rate, regular rhythm. No murmu Respiratory: Normal respiratory effort without tachypnea nor retractions. Breath sounds are  clear Gastrointestinal: Soft and nontender. No distention.  Musculoskeletal: Nontender with normal range of motion in all extremities. Neurologic:  Normal speech and language. No gross focal neurologic deficits Skin:  Skin is warm, dry and intact.  Psychiatric: Mood and affect are normal.   ____________________________________________   INITIAL IMPRESSION / ASSESSMENT AND PLAN / ED COURSE  Pertinent labs & imaging results that were available during my care of the patient were reviewed by me and considered in my medical decision making (see chart for details).  Patient presents to the emergency department for rectal bleeding.  Differential would include anal fissure, internal/external hemorrhoids, diverticular bleed, colon cancers, colitis or diverticulitis.  Reassuringly patient has a benign abdominal exam completely nontender to palpation.  Patient's labs including white blood cell count are normal.  On rectal examination patient does have no obvious rectal tear or fissure.  Small external hemorrhoid.  Patient did have significant discomfort with digital insertion, stool is light brown in color but it is slightly guaiac positive.  Given the patient's complaint of pain with bowel movement initiation as well as discomfort with digital examination I highly suspect fissure/tear versus internal hemorrhoid to be the cause of the patient's bleeding and discomfort.  I discussed with the patient the use of a steroid suppository as well as Colace twice daily and follow-up with GI medicine.  Patient is agreeable to this plan of care.  Diagnosis: Rectal bleeding   Harvest Dark, MD 02/08/18 [phone]

## 2018-02-08 NOTE — Discharge Instructions (Addendum)
As we discussed please use your medications as prescribed.  Please follow-up with GI medicine by calling the number provided.

## 2018-02-08 NOTE — ED Notes (Signed)
PT reports feeling discomfort during rectal exam, and has some lower abdominal cramping.  Pt has hx of fissures and had a colonoscopy several years ago.

## 2018-02-08 NOTE — ED Triage Notes (Signed)
Has noted rectal bleeding x 4 days. States has been daily.

## 2018-02-09 LAB — SAMPLE TO BLOOD BANK

## 2018-02-10 ENCOUNTER — Ambulatory Visit: Payer: Medicaid Other | Admitting: Nurse Practitioner

## 2018-02-10 ENCOUNTER — Encounter: Payer: Self-pay | Admitting: Nurse Practitioner

## 2018-02-10 VITALS — BP 120/100 | HR 98 | Temp 98.5°F | Resp 16 | Ht 62.0 in | Wt 200.0 lb

## 2018-02-10 DIAGNOSIS — F17200 Nicotine dependence, unspecified, uncomplicated: Secondary | ICD-10-CM | POA: Diagnosis not present

## 2018-02-10 DIAGNOSIS — K625 Hemorrhage of anus and rectum: Secondary | ICD-10-CM | POA: Diagnosis not present

## 2018-02-10 DIAGNOSIS — Z23 Encounter for immunization: Secondary | ICD-10-CM | POA: Diagnosis not present

## 2018-02-10 DIAGNOSIS — K6289 Other specified diseases of anus and rectum: Secondary | ICD-10-CM

## 2018-02-10 DIAGNOSIS — R030 Elevated blood-pressure reading, without diagnosis of hypertension: Secondary | ICD-10-CM

## 2018-02-10 NOTE — Progress Notes (Signed)
Name: Lauren [name]   MRN: [id number]    DOB: 02/25/1976   Date:02/10/2018       Progress Note  Subjective  Chief Complaint  Chief Complaint  Patient presents with  . Rectal Problems    HPI  Patient was seen in ER on 8/24 for rectal bleeding; has hx of anal tear in the past- no obvious tear or fissure noted on rectal exam. Noted external hemorrhoids; and pain with DRE and positive guaiac. Patient rx stool softener and steroid suppository for pain.  Today patient is tearful in office; standing due to pain with sitting. States has been taking the stool softener twice a day and has used the suppository for pain- states it is very painful to insert. Has mild pain relief once inserted. Notes bright red blood when wiping. Pain is so bad she is fearful of having bowel movement. Denies abdominal pain, nausea, vomiting or diarrhea, lightheadedness, dizziness, or large clots noted.    Patient Active Problem List   Diagnosis Date Noted  . Morbid obesity ([place]) 01/13/2018  . Allergic rhinitis, seasonal 04/23/2017  . Genital herpes 04/23/2017  . Type 2 diabetes mellitus, uncontrolled ([place]) 04/23/2017  . 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 ([place]) 05/17/2016  . Bronchitis 03/16/2016  . Cholesteatoma of attic of right ear 12/05/2015  . Nocturnal cough 11/29/2015  . GERD (gastroesophageal reflux disease) 11/29/2015  . Acid reflux 09/19/2015  . Screening for breast cancer 09/19/2015  . Chronic pelvic pain in female 09/19/2015  . Vitamin D deficiency 12/13/2014  . History of herpes genitalis 12/13/2014  . Hyperlipidemia 05/26/2014  . Smoker 05/26/2014    Past Medical History:  Diagnosis Date  . Allergic rhinitis   . Anxiety   . Chronic depression   . Diabetes mellitus without complication ([place])   . Elevated LFTs   . Genital herpes   . GERD (gastroesophageal reflux disease)   . Hyperlipidemia   . Vitamin D deficiency     Past Surgical History:  Procedure Laterality Date  .  APPENDECTOMY    . CESAREAN SECTION     x 4   . COLONOSCOPY    . HERNIA REPAIR  09/25/2011  . TUBAL LIGATION    . TYMPANOMASTOIDECTOMY Right 12/07/2015   Procedure: TYMPANOMASTOIDECTOMY;  Surgeon: Clyde Canterbury, MD;  Location: [facility] ORS;  Service: ENT;  Laterality: Right;    Social History   Tobacco Use  . Smoking status: Current Every Day Smoker    Packs/day: 1.00    Years: 28.00    Pack years: 28.00    Types: Cigarettes  . Smokeless tobacco: Never Used  Substance Use Topics  . Alcohol use: Yes    Alcohol/week: 0.0 standard drinks    Comment: occasionally= 3 beers per month.     Current Outpatient Medications:  .  albuterol (PROVENTIL HFA) 108 (90 Base) MCG/ACT inhaler, Inhale 2 puffs into the lungs every 6 (six) hours as needed for wheezing or shortness of breath., Disp: 1 Inhaler, Rfl: 3 .  ALPRAZolam (XANAX) 0.5 MG tablet, Take 1 tablet (0.5 mg total) by mouth 2 (two) times daily as needed for anxiety., Disp: 60 tablet, Rfl: 0 .  citalopram (CELEXA) 40 MG tablet, Take 1 tablet (40 mg total) by mouth daily., Disp: 90 tablet, Rfl: 0 .  dapagliflozin propanediol (FARXIGA) 10 MG TABS tablet, Take 10 mg by mouth daily., Disp: , Rfl:  .  docusate sodium (COLACE) 100 MG capsule, Take 1 capsule (100 mg total) by mouth 2 (two)  times daily., Disp: 60 capsule, Rfl: 2 .  fluticasone (FLONASE) 50 MCG/ACT nasal spray, Place 2 sprays into both nostrils daily., Disp: 16 g, Rfl: 2 .  hydrocortisone (ANUSOL-HC) 25 MG suppository, Place 1 suppository (25 mg total) rectally every 12 (twelve) hours for 12 days., Disp: 12 suppository, Rfl: 1 .  lisinopril (PRINIVIL,ZESTRIL) 20 MG tablet, Take 20 mg by mouth daily., Disp: , Rfl: 11 .  metFORMIN (GLUCOPHAGE-XR) 500 MG 24 hr tablet, TAKE 1 TABLET BY MOUTH DAILY WITH BREAKFAST FOR 1 WEEK THEN TWICE A DAY THERAFTER., Disp: 60 tablet, Rfl: 1 .  ranitidine (ZANTAC) 150 MG tablet, TAKE 1 TABLET (150 MG TOTAL) BY MOUTH AT BEDTIME., Disp: 90 tablet, Rfl: 0 .   rosuvastatin (CRESTOR) 10 MG tablet, Take 1 tablet (10 mg total) by mouth at bedtime. For cholesterol; STOP simvastatin, Disp: 90 tablet, Rfl: 0 .  sitaGLIPtin (JANUVIA) 100 MG tablet, Take 100 mg by mouth daily., Disp: , Rfl:  .  valACYclovir (VALTREX) 1000 MG tablet, TAKE 1 TABLET (1,000 MG TOTAL) BY MOUTH DAILY., Disp: 90 tablet, Rfl: 3  Current Facility-Administered Medications:  .  albuterol (PROVENTIL) (2.5 MG/3ML) 0.083% nebulizer solution 2.5 mg, 2.5 mg, Nebulization, Once, Wen Munford E, NP  No Known Allergies  ROS   No other specific complaints in a complete review of systems (except as listed in HPI above).  Objective  Vitals:   02/10/18 1109  BP: (!) 120/100  Pulse: (!) 102  Resp: 16  Temp: 98.5 F (36.9 C)  TempSrc: Oral  SpO2: 98%  Weight: 200 lb (90.7 kg)  Height: 5' 2"  (1.575 m)    Body mass index is 36.58 kg/m.  Nursing Note and Vital Signs reviewed.  Physical Exam  Constitutional: She is oriented to person, place, and time. She appears well-developed and well-nourished. She appears distressed (tearful and in pain).  HENT:  Head: Normocephalic and atraumatic.  Cardiovascular: Normal rate, regular rhythm, normal heart sounds and intact distal pulses.  Pulmonary/Chest: Effort normal and breath sounds normal. No respiratory distress.  Abdominal: Soft. Bowel sounds are normal. There is no tenderness.  Genitourinary:     Neurological: She is alert and oriented to person, place, and time.  Skin: Skin is dry.  Psychiatric: Her behavior is normal. Judgment and thought content normal.      Results for orders placed or performed during the hospital encounter of 02/08/18 (from the past 48 hour(s))  Comprehensive metabolic panel     Status: Abnormal   Collection Time: 02/08/18 12:37 PM  Result Value Ref Range   Sodium 132 (L) 135 - 145 mmol/L   Potassium 4.6 3.5 - 5.1 mmol/L   Chloride 101 98 - 111 mmol/L   CO2 24 22 - 32 mmol/L   Glucose, Bld  176 (H) 70 - 99 mg/dL   BUN 15 6 - 20 mg/dL   Creatinine, Ser 1.24 (H) 0.44 - 1.00 mg/dL   Calcium 9.6 8.9 - 10.3 mg/dL   Total Protein 8.0 6.5 - 8.1 g/dL   Albumin 4.6 3.5 - 5.0 g/dL   AST 25 15 - 41 U/L   ALT 33 0 - 44 U/L   Alkaline Phosphatase 67 38 - 126 U/L   Total Bilirubin 0.5 0.3 - 1.2 mg/dL   GFR calc non Af Amer 53 (L) >60 mL/min   GFR calc Af Amer >60 >60 mL/min    Comment: (NOTE) The eGFR has been calculated using the CKD EPI equation. This calculation has not been validated  in all clinical situations. eGFR's persistently <60 mL/min signify possible Chronic Kidney Disease.    Anion gap 7 5 - 15    Comment: Performed at [facility], [address] [place], [place] [address]  CBC     Status: Abnormal   Collection Time: 02/08/18 12:37 PM  Result Value Ref Range   WBC 10.8 3.6 - 11.0 K/uL   RBC 5.20 3.80 - 5.20 MIL/uL   Hemoglobin 16.1 (H) 12.0 - 16.0 g/dL   HCT 47.8 (H) 35.0 - 47.0 %   MCV 92.0 80.0 - 100.0 fL   MCH 31.0 26.0 - 34.0 pg   MCHC 33.7 32.0 - 36.0 g/dL   RDW 13.7 11.5 - 14.5 %   Platelets 231 150 - 440 K/uL    Comment: Performed at [facility], [address] [place], [place] [address]    Assessment & Plan  1. Rectal pain Continue colace and steroid suppository; no obvious skin tears noted; recommended recticare with lidocaine. - Ambulatory referral to Gastroenterology  2. Rectal bleeding -discussed ER precautions  - Ambulatory referral to Gastroenterology  3. Need for influenza vaccination - Flu Vaccine QUAD 6+ mos PF IM (Fluarix Quad PF)  4. Smoker Discussed increased risks with smoking discussed cutting down.   5. Elevated BP without diagnosis of hypertension Elevated BP and mildly elevated HR likely due to pain, patient pacing and tearful in office. Discussed pain relief options. Will re-evaluate in one month. Discussed ER precautions.    Face-to-face time with patient was more than 25 minutes, >50% time spent  counseling and coordination of care

## 2018-02-10 NOTE — Patient Instructions (Addendum)
Use Recticare with lidocaine Ointment this is an over the counter ointment that can be found at most pharmacies on the hemorrhoid aisle.   Discontinue if Rectal bleeding occurs  Condition worsens or does not improve within 7 days  Allergic reaction occurs  Symptom being treated does not subside or if redness, irritation, swelling, pain or other symptoms develop or increase  Symptoms clear up and return within a few days    Coping with Quitting Smoking Quitting smoking is a physical and mental challenge. You will face cravings, withdrawal symptoms, and temptation. Before quitting, work with your health care provider to make a plan that can help you cope. Preparation can help you quit and keep you from giving in. How can I cope with cravings? Cravings usually last for 5-10 minutes. If you get through it, the craving will pass. Consider taking the following actions to help you cope with cravings:  Keep your mouth busy: ? Chew sugar-free gum. ? Suck on hard candies or a straw. ? Brush your teeth.  Keep your hands and body busy: ? Immediately change to a different activity when you feel a craving. ? Squeeze or play with a ball. ? Do an activity or a hobby, like making bead jewelry, practicing needlepoint, or working with wood. ? Mix up your normal routine. ? Take a short exercise break. Go for a quick walk or run up and down stairs. ? Spend time in public places where smoking is not allowed.  Focus on doing something kind or helpful for someone else.  Call a friend or family member to talk during a craving.  Join a support group.  Call a quit line, such as 1-800-QUIT-NOW.  Talk with your health care provider about medicines that might help you cope with cravings and make quitting easier for you.  How can I deal with withdrawal symptoms? Your body may experience negative effects as it tries to get used to not having nicotine in the system. These effects are called withdrawal  symptoms. They may include:  Feeling hungrier than normal.  Trouble concentrating.  Irritability.  Trouble sleeping.  Feeling depressed.  Restlessness and agitation.  Craving a cigarette.  To manage withdrawal symptoms:  Avoid places, people, and activities that trigger your cravings.  Remember why you want to quit.  Get plenty of sleep.  Avoid coffee and other caffeinated drinks. These may worsen some of your symptoms.  How can I handle social situations? Social situations can be difficult when you are quitting smoking, especially in the first few weeks. To manage this, you can:  Avoid parties, bars, and other social situations where people might be smoking.  Avoid alcohol.  Leave right away if you have the urge to smoke.  Explain to your family and friends that you are quitting smoking. Ask for understanding and support.  Plan activities with friends or family where smoking is not an option.  What are some ways I can cope with stress? Wanting to smoke may cause stress, and stress can make you want to smoke. Find ways to manage your stress. Relaxation techniques can help. For example:  Breathe slowly and deeply, in through your nose and out through your mouth.  Listen to soothing, relaxing music.  Talk with a family member or friend about your stress.  Light a candle.  Soak in a bath or take a shower.  Think about a peaceful place.  What are some ways I can prevent weight gain? Be aware that many people  gain weight after they quit smoking. However, not everyone does. To keep from gaining weight, have a plan in place before you quit and stick to the plan after you quit. Your plan should include:  Having healthy snacks. When you have a craving, it may help to: ? Eat plain popcorn, crunchy carrots, celery, or other cut vegetables. ? Chew sugar-free gum.  Changing how you eat: ? Eat small portion sizes at meals. ? Eat 4-6 small meals throughout the day  instead of 1-2 large meals a day. ? Be mindful when you eat. Do not watch television or do other things that might distract you as you eat.  Exercising regularly: ? Make time to exercise each day. If you do not have time for a long workout, do short bouts of exercise for 5-10 minutes several times a day. ? Do some form of strengthening exercise, like weight lifting, and some form of aerobic exercise, like running or swimming.  Drinking plenty of water or other low-calorie or no-calorie drinks. Drink 6-8 glasses of water daily, or as much as instructed by your health care provider.  Summary  Quitting smoking is a physical and mental challenge. You will face cravings, withdrawal symptoms, and temptation to smoke again. Preparation can help you as you go through these challenges.  You can cope with cravings by keeping your mouth busy (such as by chewing gum), keeping your body and hands busy, and making calls to family, friends, or a helpline for people who want to quit smoking.  You can cope with withdrawal symptoms by avoiding places where people smoke, avoiding drinks with caffeine, and getting plenty of rest.  Ask your health care provider about the different ways to prevent weight gain, avoid stress, and handle social situations. This information is not intended to replace advice given to you by your health care provider. Make sure you discuss any questions you have with your health care provider. Document Released: 06/01/2016 Document Revised: 06/01/2016 Document Reviewed: 06/01/2016 Elsevier Interactive Patient Education  [employer].

## 2018-02-24 ENCOUNTER — Ambulatory Visit: Payer: Medicaid Other

## 2018-02-24 ENCOUNTER — Telehealth: Payer: Self-pay | Admitting: Dietician

## 2018-02-24 NOTE — Telephone Encounter (Signed)
Called patient as she missed coming to class this evening. 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 She rescheduled to the next class series beginning 03/24/18.

## 2018-03-01 ENCOUNTER — Other Ambulatory Visit: Payer: Self-pay | Admitting: Nurse Practitioner

## 2018-03-01 DIAGNOSIS — E1165 Type 2 diabetes mellitus with hyperglycemia: Secondary | ICD-10-CM

## 2018-03-03 ENCOUNTER — Ambulatory Visit: Payer: Medicaid Other

## 2018-03-10 ENCOUNTER — Ambulatory Visit: Payer: Medicaid Other

## 2018-03-17 ENCOUNTER — Encounter: Payer: Self-pay | Admitting: Family Medicine

## 2018-03-17 ENCOUNTER — Ambulatory Visit: Payer: Medicaid Other | Admitting: Family Medicine

## 2018-03-17 VITALS — BP 132/76 | HR 97 | Temp 98.9°F | Ht 63.0 in | Wt 206.4 lb

## 2018-03-17 DIAGNOSIS — F17200 Nicotine dependence, unspecified, uncomplicated: Secondary | ICD-10-CM

## 2018-03-17 DIAGNOSIS — K625 Hemorrhage of anus and rectum: Secondary | ICD-10-CM | POA: Diagnosis not present

## 2018-03-17 DIAGNOSIS — R030 Elevated blood-pressure reading, without diagnosis of hypertension: Secondary | ICD-10-CM

## 2018-03-17 DIAGNOSIS — E1165 Type 2 diabetes mellitus with hyperglycemia: Secondary | ICD-10-CM

## 2018-03-17 DIAGNOSIS — K602 Anal fissure, unspecified: Secondary | ICD-10-CM

## 2018-03-17 NOTE — Patient Instructions (Addendum)
Please give Dr. Alice Reichert a call about your symptoms: [phone]  I do encourage you to quit smoking Call [phone] to sign up for smoking cessation classes You can call 1-800-QUIT-NOW to talk with a smoking cessation coach  Limit drive thru trips by one a week Limit sweets   Rectal Bleeding Rectal bleeding is when blood comes out of the opening of the butt (anus). People with this kind of bleeding may notice bright red blood in their underwear or in the toilet after they poop (have a bowel movement). They may also have dark red or black poop (stool). Rectal bleeding is often a sign that something is wrong. It needs to be checked by a doctor. Follow these instructions at home: Watch for any changes in your condition. Take these actions to help with bleeding and discomfort:  Eat a diet that is high in fiber. This will keep your poop soft so it is easier for you to poop without pushing too hard. Ask your doctor to tell you what foods and drinks are high in fiber.  Drink enough fluid to keep your pee (urine) clear or pale yellow. This also helps keep your poop soft.  Try taking a warm bath. This may help with pain.  Keep all follow-up visits as told by your doctor. This is important.  Get help right away if:  You have new bleeding.  You have more bleeding than before.  You have black or dark red poop.  You throw up (vomit) blood or something that looks like coffee grounds.  You have pain or tenderness in your belly (abdomen).  You have a fever.  You feel weak.  You feel sick to your stomach (nauseous).  You pass out (faint).  You have very bad pain in your butt.  You cannot poop. This information is not intended to replace advice given to you by your health care provider. Make sure you discuss any questions you have with your health care provider. Document Released: 02/14/2011 Document Revised: 11/10/2015 Document Reviewed: 07/31/2015 Elsevier Interactive Patient  Education  2018 [place] with Quitting Smoking Quitting smoking is a physical and mental challenge. You will face cravings, withdrawal symptoms, and temptation. Before quitting, work with your health care provider to make a plan that can help you cope. Preparation can help you quit and keep you from giving in. How can I cope with cravings? Cravings usually last for 5-10 minutes. If you get through it, the craving will pass. Consider taking the following actions to help you cope with cravings:  Keep your mouth busy: ? Chew sugar-free gum. ? Suck on hard candies or a straw. ? Brush your teeth.  Keep your hands and body busy: ? Immediately change to a different activity when you feel a craving. ? Squeeze or play with a ball. ? Do an activity or a hobby, like making bead jewelry, practicing needlepoint, or working with wood. ? Mix up your normal routine. ? Take a short exercise break. Go for a quick walk or run up and down stairs. ? Spend time in public places where smoking is not allowed.  Focus on doing something kind or helpful for someone else.  Call a friend or family member to talk during a craving.  Join a support group.  Call a quit line, such as 1-800-QUIT-NOW.  Talk with your health care provider about medicines that might help you cope with cravings and make quitting easier for you.  How can I deal with  withdrawal symptoms? Your body may experience negative effects as it tries to get used to not having nicotine in the system. These effects are called withdrawal symptoms. They may include:  Feeling hungrier than normal.  Trouble concentrating.  Irritability.  Trouble sleeping.  Feeling depressed.  Restlessness and agitation.  Craving a cigarette.  To manage withdrawal symptoms:  Avoid places, people, and activities that trigger your cravings.  Remember why you want to quit.  Get plenty of sleep.  Avoid coffee and other caffeinated drinks.  These may worsen some of your symptoms.  How can I handle social situations? Social situations can be difficult when you are quitting smoking, especially in the first few weeks. To manage this, you can:  Avoid parties, bars, and other social situations where people might be smoking.  Avoid alcohol.  Leave right away if you have the urge to smoke.  Explain to your family and friends that you are quitting smoking. Ask for understanding and support.  Plan activities with friends or family where smoking is not an option.  What are some ways I can cope with stress? Wanting to smoke may cause stress, and stress can make you want to smoke. Find ways to manage your stress. Relaxation techniques can help. For example:  Breathe slowly and deeply, in through your nose and out through your mouth.  Listen to soothing, relaxing music.  Talk with a family member or friend about your stress.  Light a candle.  Soak in a bath or take a shower.  Think about a peaceful place.  What are some ways I can prevent weight gain? Be aware that many people gain weight after they quit smoking. However, not everyone does. To keep from gaining weight, have a plan in place before you quit and stick to the plan after you quit. Your plan should include:  Having healthy snacks. When you have a craving, it may help to: ? Eat plain popcorn, crunchy carrots, celery, or other cut vegetables. ? Chew sugar-free gum.  Changing how you eat: ? Eat small portion sizes at meals. ? Eat 4-6 small meals throughout the day instead of 1-2 large meals a day. ? Be mindful when you eat. Do not watch television or do other things that might distract you as you eat.  Exercising regularly: ? Make time to exercise each day. If you do not have time for a long workout, do short bouts of exercise for 5-10 minutes several times a day. ? Do some form of strengthening exercise, like weight lifting, and some form of aerobic exercise,  like running or swimming.  Drinking plenty of water or other low-calorie or no-calorie drinks. Drink 6-8 glasses of water daily, or as much as instructed by your health care provider.  Summary  Quitting smoking is a physical and mental challenge. You will face cravings, withdrawal symptoms, and temptation to smoke again. Preparation can help you as you go through these challenges.  You can cope with cravings by keeping your mouth busy (such as by chewing gum), keeping your body and hands busy, and making calls to family, friends, or a helpline for people who want to quit smoking.  You can cope with withdrawal symptoms by avoiding places where people smoke, avoiding drinks with caffeine, and getting plenty of rest.  Ask your health care provider about the different ways to prevent weight gain, avoid stress, and handle social situations. This information is not intended to replace advice given to you by your health care  provider. Make sure you discuss any questions you have with your health care provider. Document Released: 06/01/2016 Document Revised: 06/01/2016 Document Reviewed: 06/01/2016 Elsevier Interactive Patient Education  [employer].

## 2018-03-17 NOTE — Assessment & Plan Note (Signed)
Talked with patient about imit sweets; also talked about limiting drive thru trips; return in October for visit and labs; foot exam by MD today

## 2018-03-17 NOTE — Assessment & Plan Note (Addendum)
Brainstormed with patient to try to help with weight loss; she reports limited execise with recent health problem; limit drive-thru food by one trip a week; limit sweets

## 2018-03-17 NOTE — Assessment & Plan Note (Signed)
Cessation discussion, encouraged; see AVS

## 2018-03-17 NOTE — Progress Notes (Signed)
BP 132/76   Pulse 97   Temp 98.9 F (37.2 C) (Oral)   Ht 5\' 3"  (1.6 m)   Wt 206 lb 6.4 oz (93.6 kg)   LMP 03/16/2018   SpO2 99%   BMI 36.56 kg/m     Subjective:    Patient ID: Lauren [name], female    DOB: [birth year], 42 y.o.   MRN: [id number]  HPI: Lauren [name] is a 42 y.o. female  Chief Complaint  Patient presents with  . Follow-up    HPI Patient is here for f/u She saw colleague here, Suezanne Cheshire, DNP, on 02/10/18 for rectal bleeding She had to been ot the ER on 02/08/18, presented to outpatient clinic for f/u Then saw GI on 02/11/18 Still having blood in the toiler after having BM; not constipated; taking stool softeners; not every time, just hit or miss Getting a little better; could not drive for even more than ten minutes before; using pillow and that helps a little; made it to [place] yesterday and then it started hurting Has used 3 tubes of preparation H; using Tucks wipes She has has not been back to see the GI specialist She does not feel tired or SHOB  H/H was actually slightly elevated, not low, in the ER; smoking 1 ppd; smokes more when stressed and in pain; she has tried to cut back and used vape pens; she will never use those again; first cig of the day is maybe 10 minutes; goes to the restroom; last cig of the day is right before bed; knows to not to smoke in bed; tried Chantix but she had bad dreams  Diabetes type 2; feels like it is going to be better next check; eating better; still likes her sweets, though (candy bar)  Lab Results  Component Value Date   HGBA1C 10.1 (A) 12/27/2017    Depression screen [facility] 2/9 03/17/2018 02/07/2018 01/13/2018 07/11/2017 07/02/2017  Decreased Interest 0 1 0 0 0  Down, Depressed, Hopeless 0 0 3 0 0  PHQ - 2 Score 0 1 3 0 0  Altered sleeping 0 - 3 - -  Tired, decreased energy 0 - 0 - -  Change in appetite 0 - 0 - -  Feeling bad or failure about yourself  0 - 0 - -  Trouble concentrating 0 - 0 - -  Moving slowly or  fidgety/restless 0 - 0 - -  Suicidal thoughts 0 - 0 - -  PHQ-9 Score 0 - 6 - -  Difficult doing work/chores Not difficult at all - Not difficult at all - -   Fall Risk  03/17/2018 02/07/2018 01/13/2018 07/11/2017 07/02/2017  Falls in the past year? No No No No No    Relevant past medical, surgical, family and social history reviewed Past Medical History:  Diagnosis Date  . Allergic rhinitis   . Anxiety   . Chronic depression   . Diabetes mellitus without complication ([place])   . Elevated LFTs   . Genital herpes   . GERD (gastroesophageal reflux disease)   . Hyperlipidemia   . Vitamin D deficiency    Past Surgical History:  Procedure Laterality Date  . APPENDECTOMY    . CESAREAN SECTION     x 4   . COLONOSCOPY    . HERNIA REPAIR  09/25/2011  . TUBAL LIGATION    . TYMPANOMASTOIDECTOMY Right 12/07/2015   Procedure: TYMPANOMASTOIDECTOMY;  Surgeon: Clyde Canterbury, MD;  Location: [facility] ORS;  Service: ENT;  Laterality:  Right;   Family History  Problem Relation Age of Onset  . Hypertension Mother   . Hyperlipidemia Mother   . Diabetes Mother   . Arrhythmia Father 76       A-fib  . Alcohol abuse Father   . Heart disease Brother 43       stent x 1   . Alcoholism Brother   . Asthma Son   . Asperger's syndrome Son    Social History   Tobacco Use  . Smoking status: Current Every Day Smoker    Packs/day: 1.00    Years: 28.00    Pack years: 28.00    Types: Cigarettes  . Smokeless tobacco: Never Used  Substance Use Topics  . Alcohol use: Yes    Alcohol/week: 0.0 standard drinks    Comment: occasionally= 3 beers per month.  . Drug use: No     Office Visit from 03/17/2018 in [facility]  AUDIT-C Score  0      Interim medical history since last visit reviewed. Allergies and medications reviewed  Review of Systems Per HPI unless specifically indicated above     Objective:    BP 132/76   Pulse 97   Temp 98.9 F (37.2 C) (Oral)   Ht 5\' 3"  (1.6 m)    Wt 206 lb 6.4 oz (93.6 kg)   LMP 03/16/2018   SpO2 99%   BMI 36.56 kg/m    Wt Readings from Last 3 Encounters:  03/17/18 206 lb 6.4 oz (93.6 kg)  02/10/18 200 lb (90.7 kg)  02/07/18 201 lb (91.2 kg)    Physical Exam  Constitutional: She appears well-developed and well-nourished.  HENT:  Mouth/Throat: Mucous membranes are normal.  Eyes: EOM are normal. No scleral icterus.  Cardiovascular: Normal rate.  Pulmonary/Chest: Effort normal.  Skin: No pallor.  Psychiatric: She has a normal mood and affect. Her behavior is normal.    Results for orders placed or performed during the hospital encounter of 02/08/18  Comprehensive metabolic panel  Result Value Ref Range   Sodium 132 (L) 135 - 145 mmol/L   Potassium 4.6 3.5 - 5.1 mmol/L   Chloride 101 98 - 111 mmol/L   CO2 24 22 - 32 mmol/L   Glucose, Bld 176 (H) 70 - 99 mg/dL   BUN 15 6 - 20 mg/dL   Creatinine, Ser 1.24 (H) 0.44 - 1.00 mg/dL   Calcium 9.6 8.9 - 10.3 mg/dL   Total Protein 8.0 6.5 - 8.1 g/dL   Albumin 4.6 3.5 - 5.0 g/dL   AST 25 15 - 41 U/L   ALT 33 0 - 44 U/L   Alkaline Phosphatase 67 38 - 126 U/L   Total Bilirubin 0.5 0.3 - 1.2 mg/dL   GFR calc non Af Amer 53 (L) >60 mL/min   GFR calc Af Amer >60 >60 mL/min   Anion gap 7 5 - 15  CBC  Result Value Ref Range   WBC 10.8 3.6 - 11.0 K/uL   RBC 5.20 3.80 - 5.20 MIL/uL   Hemoglobin 16.1 (H) 12.0 - 16.0 g/dL   HCT 47.8 (H) 35.0 - 47.0 %   MCV 92.0 80.0 - 100.0 fL   MCH 31.0 26.0 - 34.0 pg   MCHC 33.7 32.0 - 36.0 g/dL   RDW 13.7 11.5 - 14.5 %   Platelets 231 150 - 440 K/uL      Assessment & Plan:   Problem List Items Addressed This Visit  Endocrine   Type 2 diabetes mellitus, uncontrolled ([place])    Talked with patient about imit sweets; also talked about limiting drive thru trips; return in October for visit and labs; foot exam by MD today        Other   Smoker    Cessation discussion, encouraged; see AVS      Morbid obesity ([place])    Brainstormed  with patient to try to help with weight loss; she reports limited execise with recent health problem; limit drive-thru food by one trip a week; limit sweets       Other Visit Diagnoses    Anal fissure    -  Primary   encouraged patient to see her GI specialist; phone number given; new referral should not be needed since same problem; he can decide about CBC   BRBPR (bright red blood per rectum)       occasional bouts still more than a month after ER presentation and GI visit; encoruaged patient to see her GI specialist about this; she agrees   Elevated blood pressure reading       last visit; BP reviewed; better control today; she was anxious and upset last visit apparently       Follow up plan: No follow-ups on file.  An after-visit summary was printed and given to the patient at [facility] [facility].  Please see the patient instructions which may contain other information and recommendations beyond what is mentioned above in the assessment and plan.  No orders of the defined types were placed in this encounter.   No orders of the defined types were placed in this encounter.  >3 minutes spent counseling on smoking cessation

## 2018-03-24 ENCOUNTER — Encounter: Payer: Medicaid Other | Attending: Family Medicine

## 2018-03-24 DIAGNOSIS — E1165 Type 2 diabetes mellitus with hyperglycemia: Secondary | ICD-10-CM | POA: Insufficient documentation

## 2018-03-24 DIAGNOSIS — Z713 Dietary counseling and surveillance: Secondary | ICD-10-CM | POA: Insufficient documentation

## 2018-03-25 ENCOUNTER — Other Ambulatory Visit: Payer: Self-pay | Admitting: Nurse Practitioner

## 2018-03-25 DIAGNOSIS — J3489 Other specified disorders of nose and nasal sinuses: Secondary | ICD-10-CM

## 2018-03-28 ENCOUNTER — Encounter: Payer: Self-pay | Admitting: Dietician

## 2018-03-28 NOTE — Progress Notes (Signed)
Patient did not come to class 1 on Monday 03/24/18 as scheduled. This is the second missed class series.

## 2018-04-07 ENCOUNTER — Other Ambulatory Visit: Payer: Self-pay | Admitting: Nurse Practitioner

## 2018-04-07 DIAGNOSIS — E1165 Type 2 diabetes mellitus with hyperglycemia: Secondary | ICD-10-CM

## 2018-04-07 NOTE — Telephone Encounter (Signed)
Pt.notified

## 2018-04-07 NOTE — Telephone Encounter (Signed)
Patient was referred to endocrinologist and has established with them Please ask her to get all diabetes medicines and supplies from endo Thank you

## 2018-04-09 ENCOUNTER — Encounter: Payer: Self-pay | Admitting: *Deleted

## 2018-04-17 ENCOUNTER — Ambulatory Visit: Payer: Medicaid Other | Admitting: Family Medicine

## 2018-04-27 ENCOUNTER — Other Ambulatory Visit: Payer: Self-pay | Admitting: Family Medicine

## 2018-04-27 DIAGNOSIS — F419 Anxiety disorder, unspecified: Principal | ICD-10-CM

## 2018-04-27 DIAGNOSIS — F32A Depression, unspecified: Secondary | ICD-10-CM

## 2018-04-27 DIAGNOSIS — F329 Major depressive disorder, single episode, unspecified: Secondary | ICD-10-CM

## 2018-05-18 DIAGNOSIS — Z8639 Personal history of other endocrine, nutritional and metabolic disease: Secondary | ICD-10-CM | POA: Insufficient documentation

## 2018-05-18 DIAGNOSIS — F17200 Nicotine dependence, unspecified, uncomplicated: Secondary | ICD-10-CM | POA: Insufficient documentation

## 2018-05-19 DIAGNOSIS — I10 Essential (primary) hypertension: Secondary | ICD-10-CM

## 2018-05-19 HISTORY — DX: Essential (primary) hypertension: I10

## 2018-05-24 ENCOUNTER — Other Ambulatory Visit: Payer: Self-pay | Admitting: Nurse Practitioner

## 2018-05-24 DIAGNOSIS — E1165 Type 2 diabetes mellitus with hyperglycemia: Secondary | ICD-10-CM

## 2018-05-26 NOTE — Telephone Encounter (Signed)
Actually, patient was referred to endo for her uncontrolled diabetes  Lab Results  Component Value Date   HGBA1C 10.1 (A) 12/27/2017   She has already seen the specialist, Dr. Gabriel Carina I called pharmacy and cancelled the prescription I just approved, since we are not managing her diabetes at this time; pharmacist says she actually has a few prescribers of diabetes medicine including metformin and Januvia  I told pharmacist that we'll have her endocrinologist manage all medicines related to her diabetes

## 2018-06-27 LAB — HM DIABETES EYE EXAM

## 2018-08-24 ENCOUNTER — Telehealth: Payer: Self-pay | Admitting: Family Medicine

## 2018-08-24 DIAGNOSIS — F329 Major depressive disorder, single episode, unspecified: Secondary | ICD-10-CM

## 2018-08-24 DIAGNOSIS — F32A Depression, unspecified: Secondary | ICD-10-CM

## 2018-08-24 DIAGNOSIS — F419 Anxiety disorder, unspecified: Principal | ICD-10-CM

## 2018-08-24 NOTE — Telephone Encounter (Signed)
Please ask patient to schedule an appointment for follow-up Thank you I'll refill med

## 2018-08-25 NOTE — Telephone Encounter (Signed)
appt made for 3.27.2020 with you

## 2018-08-27 ENCOUNTER — Encounter: Payer: Self-pay | Admitting: Family Medicine

## 2018-08-27 ENCOUNTER — Ambulatory Visit: Payer: Medicaid Other | Admitting: Family Medicine

## 2018-08-27 ENCOUNTER — Other Ambulatory Visit: Payer: Self-pay

## 2018-08-27 VITALS — BP 130/70 | HR 82 | Temp 98.0°F | Resp 16 | Ht 63.0 in | Wt 199.3 lb

## 2018-08-27 DIAGNOSIS — N3001 Acute cystitis with hematuria: Secondary | ICD-10-CM | POA: Diagnosis not present

## 2018-08-27 DIAGNOSIS — R3915 Urgency of urination: Secondary | ICD-10-CM

## 2018-08-27 DIAGNOSIS — E1165 Type 2 diabetes mellitus with hyperglycemia: Secondary | ICD-10-CM

## 2018-08-27 LAB — POCT URINALYSIS DIPSTICK
Appearance: NORMAL
Bilirubin, UA: NEGATIVE
Blood, UA: POSITIVE
Glucose, UA: POSITIVE — AB
Ketones, UA: NEGATIVE
Leukocytes, UA: NEGATIVE
Nitrite, UA: NEGATIVE
Odor: NORMAL
Protein, UA: NEGATIVE
Spec Grav, UA: 1.01 (ref 1.010–1.025)
Urobilinogen, UA: 0.2 E.U./dL
pH, UA: 6.5 (ref 5.0–8.0)

## 2018-08-27 MED ORDER — SULFAMETHOXAZOLE-TRIMETHOPRIM 800-160 MG PO TABS: 1.0000 | ORAL_TABLET | Freq: Two times a day (BID) | ORAL | 0 refills | Status: AC

## 2018-08-27 NOTE — Progress Notes (Signed)
Name: Lauren [name]   MRN: [id number]    DOB: Jul 28, 1975   Date:08/27/2018       Progress Note  Subjective  Chief Complaint  Chief Complaint  Patient presents with  . Urinary Urgency  . Abdominal Pain    HPI  Urinary Urgency/Frequency: Started last night, also dysuria and lower abdominal discomfort when voiding.  No abdominal/flank/back pain, no history of kidney stones, and no frank hematuria, fevers, or chills.  DM: Seeing endocrinology, last A1C was October 2019, A1C was 7.5%.  Preprandial have been in the 100-110's, postprandial have been 150-170's.  She is taking Metformin, Januvia, and [place].  Denies polydipsia or polyphagia.   Patient Active Problem List   Diagnosis Date Noted  . Morbid obesity ([place]) 01/13/2018  . Allergic rhinitis, seasonal 04/23/2017  . Genital herpes 04/23/2017  . Type 2 diabetes mellitus, uncontrolled ([place]) 04/23/2017  . 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 Bronchitis 03/16/2016  . Cholesteatoma of attic of right ear 12/05/2015  . Nocturnal cough 11/29/2015  . GERD (gastroesophageal reflux disease) 11/29/2015  . Acid reflux 09/19/2015  . Screening for breast cancer 09/19/2015  . Chronic pelvic pain in female 09/19/2015  . Vitamin D deficiency 12/13/2014  . History of herpes genitalis 12/13/2014  . Hyperlipidemia 05/26/2014  . Smoker 05/26/2014    Social History   Tobacco Use  . Smoking status: Current Every Day Smoker    Packs/day: 1.00    Years: 28.00    Pack years: 28.00    Types: Cigarettes  . Smokeless tobacco: Never Used  Substance Use Topics  . Alcohol use: Yes    Alcohol/week: 0.0 standard drinks    Comment: occasionally= 3 beers per month.     Current Outpatient Medications:  .  albuterol (PROVENTIL HFA) 108 (90 Base) MCG/ACT inhaler, Inhale 2 puffs into the lungs every 6 (six) hours as needed for wheezing or shortness of breath., Disp: 1 Inhaler, Rfl: 3 .  ALPRAZolam (XANAX) 0.5 MG  tablet, Take 1 tablet (0.5 mg total) by mouth 2 (two) times daily as needed for anxiety., Disp: 60 tablet, Rfl: 0 .  citalopram (CELEXA) 40 MG tablet, TAKE 1 TABLET BY MOUTH EVERY DAY, Disp: 30 tablet, Rfl: 0 .  dapagliflozin propanediol (FARXIGA) 10 MG TABS tablet, Take 10 mg by mouth daily., Disp: , Rfl:  .  fluticasone (FLONASE) 50 MCG/ACT nasal spray, SPRAY 2 SPRAYS INTO EACH NOSTRIL EVERY DAY, Disp: 1 g, Rfl: 2 .  lisinopril (PRINIVIL,ZESTRIL) 20 MG tablet, Take 20 mg by mouth daily., Disp: , Rfl: 11 .  metFORMIN (GLUCOPHAGE-XR) 500 MG 24 hr tablet, Take 2 tablets (1,000 mg total) by mouth daily., Disp: 60 tablet, Rfl: 0 .  ranitidine (ZANTAC) 150 MG tablet, TAKE 1 TABLET (150 MG TOTAL) BY MOUTH AT BEDTIME., Disp: 90 tablet, Rfl: 0 .  rosuvastatin (CRESTOR) 10 MG tablet, Take 1 tablet (10 mg total) by mouth at bedtime. For cholesterol; STOP simvastatin, Disp: 90 tablet, Rfl: 0 .  sitaGLIPtin (JANUVIA) 100 MG tablet, Take 100 mg by mouth daily., Disp: , Rfl:  .  valACYclovir (VALTREX) 1000 MG tablet, TAKE 1 TABLET (1,000 MG TOTAL) BY MOUTH DAILY., Disp: 90 tablet, Rfl: 3 .  sulfamethoxazole-trimethoprim (BACTRIM DS,SEPTRA DS) 800-160 MG tablet, Take 1 tablet by mouth 2 (two) times daily for 5 days., Disp: 10 tablet, Rfl: 0  No Known Allergies  I personally reviewed active problem list, medication list, allergies, notes from last encounter, lab results with the  patient/caregiver today.  ROS  Ten systems reviewed and is negative except as mentioned in HPI  Objective  Vitals:   08/27/18 0917  BP: 130/70  Pulse: 82  Resp: 16  Temp: 98 F (36.7 C)  TempSrc: Oral  SpO2: 98%  Weight: 199 lb 4.8 oz (90.4 kg)  Height: 5\' 3"  (1.6 m)    Body mass index is 35.3 kg/m.  Nursing Note and Vital Signs reviewed.  Physical Exam  Constitutional: Patient appears well-developed and well-nourished. Obese. No distress.  HEENT: head atraumatic, normocephalic Cardiovascular: Normal rate,  regular rhythm and normal heart sounds.  No murmur heard. No BLE edema. Pulmonary/Chest: Effort normal and breath sounds clear bilaterally. No respiratory distress. Abdominal: Soft, bowel sounds normal, there is no tenderness, no HSM. No CVA tenderness. Psychiatric: Patient has a normal mood and affect. behavior is normal. Judgment and thought content normal. Neurological: Pt is alert and oriented to person, place, and time. No cranial nerve deficit. Coordination, balance, strength, speech and gait are normal.  Skin: Skin is warm and dry. No rash noted. No erythema.   Results for orders placed or performed in visit on 08/27/18 (from the past 72 hour(s))  POCT Urinalysis Dipstick     Status: Abnormal   Collection Time: 08/27/18  9:25 AM  Result Value Ref Range   Color, UA orange    Clarity, UA clear    Glucose, UA Positive (A) Negative   Bilirubin, UA Negative    Ketones, UA Negative    Spec Grav, UA 1.010 1.010 - 1.025   Blood, UA Positive     Comment: Moderate   pH, UA 6.5 5.0 - 8.0   Protein, UA Negative Negative   Urobilinogen, UA 0.2 0.2 or 1.0 E.U./dL   Nitrite, UA Negative    Leukocytes, UA Negative Negative   Appearance Normal    Odor Normal     Assessment & Plan  1. Acute cystitis with hematuria - sulfamethoxazole-trimethoprim (BACTRIM DS,SEPTRA DS) 800-160 MG tablet; Take 1 tablet by mouth 2 (two) times daily for 5 days.  Dispense: 10 tablet; Refill: 0 - Urine Culture  2. Urinary urgency - POCT Urinalysis Dipstick  3. Uncontrolled type 2 diabetes mellitus with hyperglycemia ([place]) - Did advise that [place] can contribute to mycotic urinary infections, and if antibiotic does not clear symptoms, we will investigate this further.  -Red flags and when to present for emergency care or RTC including fever >101.68F, chest pain, shortness of breath, new/worsening/un-resolving symptoms, reviewed with patient at time of visit. Follow up and care instructions discussed and  provided in AVS.

## 2018-08-27 NOTE — Patient Instructions (Signed)
Urinary Tract Infection, Adult A urinary tract infection (UTI) is an infection of any part of the urinary tract. The urinary tract includes:  The kidneys.  The ureters.  The bladder.  The urethra. These organs make, store, and get rid of pee (urine) in the body. What are the causes? This is caused by germs (bacteria) in your genital area. These germs grow and cause swelling (inflammation) of your urinary tract. What increases the risk? You are more likely to develop this condition if:  You have a small, thin tube (catheter) to drain pee.  You cannot control when you pee or poop (incontinence).  You are female, and: ? You use these methods to prevent pregnancy: ? A medicine that kills sperm (spermicide). ? A device that blocks sperm (diaphragm). ? You have low levels of a female hormone (estrogen). ? You are pregnant.  You have genes that add to your risk.  You are sexually active.  You take antibiotic medicines.  You have trouble peeing because of: ? A prostate that is bigger than normal, if you are female. ? A blockage in the part of your body that drains pee from the bladder (urethra). ? A kidney stone. ? A nerve condition that affects your bladder (neurogenic bladder). ? Not getting enough to drink. ? Not peeing often enough.  You have other conditions, such as: ? Diabetes. ? A weak disease-fighting system (immune system). ? Sickle cell disease. ? Gout. ? Injury of the spine. What are the signs or symptoms? Symptoms of this condition include:  Needing to pee right away (urgently).  Peeing often.  Peeing small amounts often.  Pain or burning when peeing.  Blood in the pee.  Pee that smells bad or not like normal.  Trouble peeing.  Pee that is cloudy.  Fluid coming from the vagina, if you are female.  Pain in the belly or lower back. Other symptoms include:  Throwing up (vomiting).  No urge to eat.  Feeling mixed up (confused).  Being tired  and grouchy (irritable).  A fever.  Watery poop (diarrhea). How is this treated? This condition may be treated with:  Antibiotic medicine.  Other medicines.  Drinking enough water. Follow these instructions at home:  Medicines  Take over-the-counter and prescription medicines only as told by your doctor.  If you were prescribed an antibiotic medicine, take it as told by your doctor. Do not stop taking it even if you start to feel better. General instructions  Make sure you: ? Pee until your bladder is empty. ? Do not hold pee for a long time. ? Empty your bladder after sex. ? Wipe from front to back after pooping if you are a female. Use each tissue one time when you wipe.  Drink enough fluid to keep your pee pale yellow.  Keep all follow-up visits as told by your doctor. This is important. Contact a doctor if:  You do not get better after 1-2 days.  Your symptoms go away and then come back. Get help right away if:  You have very bad back pain.  You have very bad pain in your lower belly.  You have a fever.  You are sick to your stomach (nauseous).  You are throwing up. Summary  A urinary tract infection (UTI) is an infection of any part of the urinary tract.  This condition is caused by germs in your genital area.  There are many risk factors for a UTI. These include having a small, thin   tube to drain pee and not being able to control when you pee or poop.  Treatment includes antibiotic medicines for germs.  Drink enough fluid to keep your pee pale yellow. This information is not intended to replace advice given to you by your health care provider. Make sure you discuss any questions you have with your health care provider. Document Released: 11/21/2007 Document Revised: 12/12/2017 Document Reviewed: 12/12/2017 Elsevier Interactive Patient Education  2019 Elsevier Inc.  

## 2018-08-29 LAB — URINE CULTURE
MICRO NUMBER:: 306952
SPECIMEN QUALITY:: ADEQUATE

## 2018-09-05 ENCOUNTER — Ambulatory Visit: Payer: Medicaid Other | Admitting: Family Medicine

## 2018-09-11 ENCOUNTER — Ambulatory Visit: Payer: BLUE CROSS/BLUE SHIELD | Admitting: Nurse Practitioner

## 2018-09-11 ENCOUNTER — Other Ambulatory Visit: Payer: Self-pay

## 2018-09-12 ENCOUNTER — Encounter: Payer: Self-pay | Admitting: Nurse Practitioner

## 2018-09-12 ENCOUNTER — Ambulatory Visit: Payer: Medicaid Other | Admitting: Family Medicine

## 2018-09-12 ENCOUNTER — Ambulatory Visit (INDEPENDENT_AMBULATORY_CARE_PROVIDER_SITE_OTHER): Payer: BLUE CROSS/BLUE SHIELD | Admitting: Nurse Practitioner

## 2018-09-12 DIAGNOSIS — F419 Anxiety disorder, unspecified: Secondary | ICD-10-CM | POA: Diagnosis not present

## 2018-09-12 DIAGNOSIS — E782 Mixed hyperlipidemia: Secondary | ICD-10-CM

## 2018-09-12 DIAGNOSIS — K219 Gastro-esophageal reflux disease without esophagitis: Secondary | ICD-10-CM | POA: Diagnosis not present

## 2018-09-12 DIAGNOSIS — F32A Depression, unspecified: Secondary | ICD-10-CM

## 2018-09-12 DIAGNOSIS — R05 Cough: Secondary | ICD-10-CM

## 2018-09-12 DIAGNOSIS — E1165 Type 2 diabetes mellitus with hyperglycemia: Secondary | ICD-10-CM

## 2018-09-12 DIAGNOSIS — R058 Other specified cough: Secondary | ICD-10-CM

## 2018-09-12 DIAGNOSIS — F329 Major depressive disorder, single episode, unspecified: Secondary | ICD-10-CM

## 2018-09-12 DIAGNOSIS — M25562 Pain in left knee: Secondary | ICD-10-CM

## 2018-09-12 MED ORDER — ROSUVASTATIN CALCIUM 10 MG PO TABS: 10.0000 mg | ORAL_TABLET | Freq: Every day | ORAL | 0 refills | Status: DC

## 2018-09-12 MED ORDER — ALBUTEROL SULFATE HFA 108 (90 BASE) MCG/ACT IN AERS: 2.0000 | INHALATION_SPRAY | Freq: Four times a day (QID) | RESPIRATORY_TRACT | 3 refills | Status: DC | PRN

## 2018-09-12 MED ORDER — METFORMIN HCL ER 500 MG PO TB24: 1000.0000 mg | ORAL_TABLET | Freq: Every day | ORAL | 0 refills | Status: DC

## 2018-09-12 MED ORDER — CITALOPRAM HYDROBROMIDE 40 MG PO TABS: 40.0000 mg | ORAL_TABLET | Freq: Every day | ORAL | 0 refills | Status: DC

## 2018-09-12 MED ORDER — BUSPIRONE HCL 7.5 MG PO TABS: 7.5000 mg | ORAL_TABLET | Freq: Two times a day (BID) | ORAL | 0 refills | Status: DC

## 2018-09-12 MED ORDER — RANITIDINE HCL 150 MG PO TABS: 150.0000 mg | ORAL_TABLET | Freq: Every day | ORAL | 0 refills | Status: DC

## 2018-09-12 NOTE — Progress Notes (Signed)
Virtual Visit via Video Note  I connected with Lauren [name] on 09/12/18 at 12:20 PM EDT by a video enabled telemedicine application and verified that I am speaking with the correct person using two identifiers.   I discussed the limitations of evaluation and management by telemedicine and the availability of in person appointments. The patient expressed understanding and agreed to proceed.  History of Present Illness:   Left knee swelling, states it makes a popping sound, worse after work. When she pops it up the swelling goes down.States pain started about 8 weeks ago and has been constant. Denies known injury.   Diabetes rx for metformin 1000mg  daily, [place] 100mg  daily and farxiga 10mg  daily. Last A1C was drawn after she had been off of her medications for over one month. Referred to endo and sees Dr. Gabriel Carina  Patient is checking fasting blood sugars at home 90-100's  After meals it is around 150 Last A1C was 7.5  Lab Results  Component Value Date   HGBA1C 10.1 (A) 12/27/2017   GERD rx for ranitidine  Just takes this as needed.   Anxiety & Depression rx celexa 40mg  feels it has managed depression somewhat but overwhelmed with anxiety the past few months, not wanting to be around people because she is overwhelmed  Observations/Objective: Alert and oriented  In no acute distress Able to walk without issue No labored breathing  Left knee noted barely appreciable swellingvia video, non-tender, no redness, bruising or obvious deformity. Full ROM   Depression screen [facility] 2/9 09/12/2018 03/17/2018 02/07/2018  Decreased Interest 0 0 1  Down, Depressed, Hopeless 2 0 0  PHQ - 2 Score 2 0 1  Altered sleeping 1 0 -  Tired, decreased energy 1 0 -  Change in appetite 0 0 -  Feeling bad or failure about yourself  1 0 -  Trouble concentrating 0 0 -  Moving slowly or fidgety/restless 0 0 -  Suicidal thoughts 0 0 -  PHQ-9 Score 5 0 -  Difficult doing work/chores Somewhat difficult Not  difficult at all -   GAD 7 : Generalized Anxiety Score 09/12/2018  Nervous, Anxious, on Edge 1  Control/stop worrying 2  Worry too much - different things 3  Trouble relaxing 3  Restless 3  Easily annoyed or irritable 3  Afraid - awful might happen 3  Total GAD 7 Score 18  Anxiety Difficulty Very difficult     Assessment and Plan:    1. Anxiety and depression Increased anxiety, start buspar follow up in one month.  - citalopram (CELEXA) 40 MG tablet; Take 1 tablet (40 mg total) by mouth daily.  Dispense: 30 tablet; Refill: 0 - busPIRone (BUSPAR) 7.5 MG tablet; Take 1 tablet (7.5 mg total) by mouth 2 (two) times daily.  Dispense: 60 tablet; Refill: 0  2. Uncontrolled type 2 diabetes mellitus with hyperglycemia ([place]) Follow-up with endo, has much improved reported A1C from 10.1 to 7.5- which good sugar readings at home. - metFORMIN (GLUCOPHAGE-XR) 500 MG 24 hr tablet; Take 2 tablets (1,000 mg total) by mouth daily.  Dispense: 60 tablet; Refill: 0  3. Gastroesophageal reflux disease, esophagitis presence not specified Takes as needed, weight loss, avoid triggers - ranitidine (ZANTAC) 150 MG tablet; Take 1 tablet (150 mg total) by mouth at bedtime.  Dispense: 90 tablet; Refill: 0  4. Nocturnal cough Stable, patient requesting PRN  - albuterol (PROVENTIL HFA) 108 (90 Base) MCG/ACT inhaler; Inhale 2 puffs into the lungs every 6 (six) hours as  needed for wheezing or shortness of breath.  Dispense: 1 Inhaler; Refill: 3  5. Morbid obesity ([facility]) Diet, exercise   6. Mixed hyperlipidemia Diet  - rosuvastatin (CRESTOR) 10 MG tablet; Take 1 tablet (10 mg total) by mouth at bedtime. For cholesterol; STOP simvastatin  Dispense: 90 tablet; Refill: 0  7. Acute pain of left knee Compression, NSAIDs (discussed limiting) elevation and ice- likely osteoarthritis possible effusion, Xray ordered after pandemic resolution can walk in if not improving, if acutely worsening contact us  - DG Knee  Complete 4 Views Left; Future   Follow Up Instructions:  Follow up in one month for anxiety  I discussed the assessment and treatment plan with the patient. The patient was provided an opportunity to ask questions and all were answered. The patient agreed with the plan and demonstrated an understanding of the instructions.   The patient was advised to call back or seek an in-person evaluation if the symptoms worsen or if the condition fails to improve as anticipated.  I provided 30 minutes of non-face-to-face time during this encounter via phone 10 and 16 video.    Fredderick Severance, NP

## 2018-09-22 ENCOUNTER — Other Ambulatory Visit: Payer: Self-pay | Admitting: Nurse Practitioner

## 2018-09-22 DIAGNOSIS — J3489 Other specified disorders of nose and nasal sinuses: Secondary | ICD-10-CM

## 2018-10-02 ENCOUNTER — Encounter: Payer: Self-pay | Admitting: Family Medicine

## 2018-10-02 ENCOUNTER — Other Ambulatory Visit: Payer: Self-pay

## 2018-10-02 ENCOUNTER — Ambulatory Visit (INDEPENDENT_AMBULATORY_CARE_PROVIDER_SITE_OTHER): Payer: BLUE CROSS/BLUE SHIELD | Admitting: Family Medicine

## 2018-10-02 DIAGNOSIS — J0101 Acute recurrent maxillary sinusitis: Secondary | ICD-10-CM

## 2018-10-02 MED ORDER — FLUCONAZOLE 150 MG PO TABS: 150.0000 mg | ORAL_TABLET | Freq: Every day | ORAL | 0 refills | Status: DC

## 2018-10-02 MED ORDER — AMOXICILLIN-POT CLAVULANATE 875-125 MG PO TABS: 1.0000 | ORAL_TABLET | Freq: Two times a day (BID) | ORAL | 0 refills | Status: DC

## 2018-10-02 NOTE — Progress Notes (Signed)
LMP 09/11/2018 (Exact Date)    Subjective:    Patient ID: Lauren [name], female    DOB: 1976/04/16, 43 y.o.   MRN: [id number]  HPI: Lauren [name] is a 43 y.o. female  Chief Complaint  Patient presents with  . URI    Onset Monday. Head congestion, productive cough, itchy/watery eyes, chills( those have ceased), diarrhea on Tuesday.    HPI Virtual Visit via Telephone/Video Note   I connected with the patient via:  [place] verified that I am speaking with the correct person using two identifiers.  Call started: did not record Call terminated: 12:13 pm Total length of call: estimate 13-14 minutes   I discussed the limitations, risks, and privacy concerns of performing an evaluation and management service by telephone and the availability of in-person appointments. I explained that he/she may be responsible for charges related to this service. The patient expressed understanding and agreed to proceed.  Provider location: home, upstairs office with door closed, earphones/headset on Patient location: home Additional participants: no one  She is having head congestion; across the cheeks and forehead She has had sinus infections before and this feels like one Pressure across cheeks and tender to the touch Some puffy eyes; no redness Having drainage, kind of pinkish with blood; no foul smell or taste Also bothered by pollen Coughing, but not a dry cough Throat is irritated but not like strep; she's had that and this is not strep Head is hurting; sinuses are bothering her She is cold even though the heat is on  No measured fever; mother has been checking No travel No exposures to persons with COVID-19 She has been staying at home and isolating She has tried tylenol sinus and cold OTC; helped a little bit No N/V; she did have an episode of diarrhea on Tuesday No shortness of breath Using masks and hand sanitizer    Depression screen [facility] 2/9 09/12/2018 03/17/2018  02/07/2018 01/13/2018 07/11/2017  Decreased Interest 0 0 1 0 0  Down, Depressed, Hopeless 2 0 0 3 0  PHQ - 2 Score 2 0 1 3 0  Altered sleeping 1 0 - 3 -  Tired, decreased energy 1 0 - 0 -  Change in appetite 0 0 - 0 -  Feeling bad or failure about yourself  1 0 - 0 -  Trouble concentrating 0 0 - 0 -  Moving slowly or fidgety/restless 0 0 - 0 -  Suicidal thoughts 0 0 - 0 -  PHQ-9 Score 5 0 - 6 -  Difficult doing work/chores Somewhat difficult Not difficult at all - Not difficult at all -   Fall Risk  10/02/2018 09/12/2018 08/27/2018 03/17/2018 02/07/2018  Falls in the past year? 0 0 0 No No  Number falls in past yr: - 0 0 - -  Injury with Fall? - 0 0 - -    Relevant past medical, surgical, family and social history reviewed Past Medical History:  Diagnosis Date  . Allergic rhinitis   . Anxiety   . Chronic depression   . Diabetes mellitus without complication ([place])   . Elevated LFTs   . Genital herpes   . GERD (gastroesophageal reflux disease)   . Hyperlipidemia   . Vitamin D deficiency    Past Surgical History:  Procedure Laterality Date  . APPENDECTOMY    . CESAREAN SECTION     x 4   . COLONOSCOPY    . HERNIA REPAIR  09/25/2011  .  TUBAL LIGATION    . TYMPANOMASTOIDECTOMY Right 12/07/2015   Procedure: TYMPANOMASTOIDECTOMY;  Surgeon: Clyde Canterbury, MD;  Location: [facility] ORS;  Service: ENT;  Laterality: Right;   Family History  Problem Relation Age of Onset  . Hypertension Mother   . Hyperlipidemia Mother   . Diabetes Mother   . Arrhythmia Father 30       A-fib  . Alcohol abuse Father   . Heart disease Brother 43       stent x 1   . Alcoholism Brother   . Asthma Son   . Asperger's syndrome Son    Social History   Tobacco Use  . Smoking status: Current Every Day Smoker    Packs/day: 1.00    Years: 28.00    Pack years: 28.00    Types: Cigarettes  . Smokeless tobacco: Never Used  Substance Use Topics  . Alcohol use: Yes    Alcohol/week: 0.0 standard drinks     Comment: occasionally= 3 beers per month.  . Drug use: No     [facility] Visit from 10/02/2018 in [facility]  AUDIT-C Score  0      Interim medical history since last visit reviewed. Allergies and medications reviewed  Review of Systems Per HPI unless specifically indicated above     Objective:    LMP 09/11/2018 (Exact Date)   Wt Readings from Last 3 Encounters:  08/27/18 199 lb 4.8 oz (90.4 kg)  03/17/18 206 lb 6.4 oz (93.6 kg)  02/10/18 200 lb (90.7 kg)    Physical Exam Constitutional:      General: She is not in acute distress.    Appearance: Normal appearance. She is not ill-appearing or diaphoretic.  Eyes:     Extraocular Movements: Extraocular movements intact.     Right eye: Normal extraocular motion.     Left eye: Normal extraocular motion.  Pulmonary:     Effort: No tachypnea or respiratory distress.     Comments: Able to speak in full sentences; breathing I not labored Skin:    Coloration: Skin is not jaundiced or pale.  Neurological:     Mental Status: She is alert.     Cranial Nerves: No dysarthria.  Psychiatric:        Mood and Affect: Mood is not anxious or depressed.        Speech: Speech is not rapid and pressured, delayed or slurred.       Assessment & Plan:   Problem List Items Addressed This Visit    None    Visit Diagnoses    Acute recurrent maxillary sinusitis    -  Primary   in the context of COVID-19, keep out of work until Monday; rest and hydration; start antibiotics; fever out of work >72 hours; c diff discussed   Relevant Medications   amoxicillin-clavulanate (AUGMENTIN) 875-125 MG tablet   fluconazole (DIFLUCAN) 150 MG tablet    I do not think she has COVID-19; no travel, low risk based on questionnaire; however, in the context of the pandemic, will agree to keep her out for seeven days after onset of her symptoms, as long as no fever for 72 hours prior to return to work; she agrees; will tentatively have her return  on Monday April 20th as long as feeling better and afebrile x 72 hours  She gets yeast infections with antibiotics so I will send in diflucan as well; presription instructions note to NOT take chol medicine for 3 days  Follow up  plan: Return if symptoms worsen or fail to improve.   Meds ordered this encounter  Medications  . amoxicillin-clavulanate (AUGMENTIN) 875-125 MG tablet    Sig: Take 1 tablet by mouth 2 (two) times daily.    Dispense:  20 tablet    Refill:  0  . fluconazole (DIFLUCAN) 150 MG tablet    Sig: Take 1 tablet (150 mg total) by mouth daily. Do not take cholesterol medicine (statin) for 3 days    Dispense:  1 tablet    Refill:  0    No orders of the defined types were placed in this encounter.

## 2018-10-03 ENCOUNTER — Telehealth: Payer: Self-pay

## 2018-10-04 ENCOUNTER — Other Ambulatory Visit: Payer: Self-pay | Admitting: Nurse Practitioner

## 2018-10-04 DIAGNOSIS — F329 Major depressive disorder, single episode, unspecified: Secondary | ICD-10-CM

## 2018-10-04 DIAGNOSIS — F419 Anxiety disorder, unspecified: Principal | ICD-10-CM

## 2018-10-04 DIAGNOSIS — E1165 Type 2 diabetes mellitus with hyperglycemia: Secondary | ICD-10-CM

## 2018-10-04 DIAGNOSIS — F32A Depression, unspecified: Secondary | ICD-10-CM

## 2018-10-06 ENCOUNTER — Telehealth: Payer: Self-pay | Admitting: Nurse Practitioner

## 2018-10-06 NOTE — Telephone Encounter (Signed)
Received request for patients metformin and provided supply at this time, however, future refills should be from endocrinologist for time being. Thank

## 2018-10-08 NOTE — Telephone Encounter (Signed)
erronous encounter

## 2018-11-04 ENCOUNTER — Other Ambulatory Visit: Payer: Self-pay | Admitting: Nurse Practitioner

## 2018-11-04 DIAGNOSIS — F32A Depression, unspecified: Secondary | ICD-10-CM

## 2018-11-04 DIAGNOSIS — F329 Major depressive disorder, single episode, unspecified: Secondary | ICD-10-CM

## 2018-11-07 ENCOUNTER — Other Ambulatory Visit: Payer: Self-pay

## 2018-11-07 ENCOUNTER — Ambulatory Visit (INDEPENDENT_AMBULATORY_CARE_PROVIDER_SITE_OTHER): Payer: BLUE CROSS/BLUE SHIELD | Admitting: Nurse Practitioner

## 2018-11-07 ENCOUNTER — Ambulatory Visit
Admission: RE | Admit: 2018-11-07 | Discharge: 2018-11-07 | Disposition: A | Discharge status: Home / Self Care | Payer: BLUE CROSS/BLUE SHIELD | Source: Ambulatory Visit | Attending: Nurse Practitioner | Admitting: Nurse Practitioner

## 2018-11-07 ENCOUNTER — Encounter: Payer: Self-pay | Admitting: Nurse Practitioner

## 2018-11-07 VITALS — Temp 98.6°F

## 2018-11-07 DIAGNOSIS — M25562 Pain in left knee: Secondary | ICD-10-CM

## 2018-11-07 DIAGNOSIS — F332 Major depressive disorder, recurrent severe without psychotic features: Secondary | ICD-10-CM

## 2018-11-07 DIAGNOSIS — M25561 Pain in right knee: Secondary | ICD-10-CM | POA: Insufficient documentation

## 2018-11-07 MED ORDER — MELOXICAM 7.5 MG PO TABS: ORAL_TABLET | ORAL | 0 refills | Status: DC

## 2018-11-07 MED ORDER — DULOXETINE HCL 30 MG PO CPEP: ORAL_CAPSULE | ORAL | 0 refills | Status: DC

## 2018-11-07 NOTE — Progress Notes (Signed)
Virtual Visit via Video Note  I connected with Lauren [name] on 11/07/18 at 11:40 AM EDT by a video enabled telemedicine application and verified that I am speaking with the correct person using two identifiers.   Staff discussed the limitations of evaluation and management by telemedicine and the availability of in person appointments. The patient expressed understanding and agreed to proceed.  Patient location: home  My location: home office Other people present: none HPI  Patient states she has bilateral knee pain worse on the right side now. Previous visit was complaining of left knee pain. States she feels like she is walking like she is 89. 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 Yesterday she twisted right knee when trying to get out of covers, area got swollen and sore to touch. Has been using OTC tylenol with minimal relief.    Depression States she is feeling very depressed with her knees, and feels like she can't do anything, she can just get to work and get home and that's about all she can do. States when the pain gets bad she has passive thoughts, no active thoughts, plan. States she does have access to guns and knives in her house.   PHQ2/9: Depression screen [facility] 2/9 11/07/2018 09/12/2018 03/17/2018 02/07/2018 01/13/2018  Decreased Interest 1 0 0 1 0  Down, Depressed, Hopeless 2 2 0 0 3  PHQ - 2 Score 3 2 0 1 3  Altered sleeping 2 1 0 - 3  Tired, decreased energy 2 1 0 - 0  Change in appetite 0 0 0 - 0  Feeling bad or failure about yourself  2 1 0 - 0  Trouble concentrating 1 0 0 - 0  Moving slowly or fidgety/restless 2 0 0 - 0  Suicidal thoughts 1 0 0 - 0  PHQ-9 Score 13 5 0 - 6  Difficult doing work/chores Very difficult Somewhat difficult Not difficult at all - Not difficult at all    PHQ reviewed. Positive   Patient Active Problem List   Diagnosis Date Noted  . Morbid obesity ([place]) 01/13/2018  . Allergic rhinitis, seasonal 04/23/2017  . Genital herpes  04/23/2017  . Type 2 diabetes mellitus, uncontrolled ([place]) 04/23/2017  . 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 Bronchitis 03/16/2016  . Cholesteatoma of attic of right ear 12/05/2015  . Nocturnal cough 11/29/2015  . GERD (gastroesophageal reflux disease) 11/29/2015  . Acid reflux 09/19/2015  . Screening for breast cancer 09/19/2015  . Chronic pelvic pain in female 09/19/2015  . Vitamin D deficiency 12/13/2014  . History of herpes genitalis 12/13/2014  . Hyperlipidemia 05/26/2014  . Smoker 05/26/2014    Past Medical History:  Diagnosis Date  . Allergic rhinitis   . Anxiety   . Chronic depression   . Diabetes mellitus without complication ([place])   . Elevated LFTs   . Genital herpes   . GERD (gastroesophageal reflux disease)   . Hyperlipidemia   . Vitamin D deficiency     Past Surgical History:  Procedure Laterality Date  . APPENDECTOMY    . CESAREAN SECTION     x 4   . COLONOSCOPY    . HERNIA REPAIR  09/25/2011  . TUBAL LIGATION    . TYMPANOMASTOIDECTOMY Right 12/07/2015   Procedure: TYMPANOMASTOIDECTOMY;  Surgeon: Clyde Canterbury, MD;  Location: [facility] ORS;  Service: ENT;  Laterality: Right;    Social History   Tobacco Use  . Smoking status: Current Every Day Smoker  Packs/day: 1.00    Years: 28.00    Pack years: 28.00    Types: Cigarettes  . Smokeless tobacco: Never Used  Substance Use Topics  . Alcohol use: Yes    Alcohol/week: 0.0 standard drinks    Comment: occasionally= 3 beers per month.     Current Outpatient Medications:  .  albuterol (PROVENTIL HFA) 108 (90 Base) MCG/ACT inhaler, Inhale 2 puffs into the lungs every 6 (six) hours as needed for wheezing or shortness of breath., Disp: 1 Inhaler, Rfl: 3 .  busPIRone (BUSPAR) 7.5 MG tablet, TAKE 1 TABLET (7.5 MG TOTAL) BY MOUTH 2 (TWO) TIMES DAILY., Disp: 180 tablet, Rfl: 1 .  citalopram (CELEXA) 40 MG tablet, TAKE 1 TABLET BY MOUTH EVERY DAY, Disp: 90 tablet, Rfl: 1 .   dapagliflozin propanediol (FARXIGA) 10 MG TABS tablet, Take 10 mg by mouth daily., Disp: , Rfl:  .  fluticasone (FLONASE) 50 MCG/ACT nasal spray, SPRAY 2 SPRAYS INTO EACH NOSTRIL EVERY DAY, Disp: 1 g, Rfl: 2 .  lisinopril (PRINIVIL,ZESTRIL) 20 MG tablet, Take 20 mg by mouth daily., Disp: , Rfl: 11 .  metFORMIN (GLUCOPHAGE-XR) 500 MG 24 hr tablet, TAKE 2 TABLETS BY MOUTH EVERY DAY, Disp: 180 tablet, Rfl: 0 .  ranitidine (ZANTAC) 150 MG tablet, Take 1 tablet (150 mg total) by mouth at bedtime., Disp: 90 tablet, Rfl: 0 .  rosuvastatin (CRESTOR) 10 MG tablet, Take 1 tablet (10 mg total) by mouth at bedtime. For cholesterol; STOP simvastatin, Disp: 90 tablet, Rfl: 0 .  sitaGLIPtin (JANUVIA) 100 MG tablet, Take 100 mg by mouth daily., Disp: , Rfl:  .  valACYclovir (VALTREX) 1000 MG tablet, TAKE 1 TABLET (1,000 MG TOTAL) BY MOUTH DAILY., Disp: 90 tablet, Rfl: 3 .  Vitamin D, Ergocalciferol, (DRISDOL) 1.25 MG (50000 UT) CAPS capsule, Take 1 capsule by mouth once a week., Disp: , Rfl:   No Known Allergies  ROS   No other specific complaints in a complete review of systems (except as listed in HPI above).  Objective  Vitals:   11/07/18 1113  Temp: 98.6 F (37 C)  TempSrc: Oral    There is no height or weight on file to calculate BMI.  Nursing Note and Vital Signs reviewed.  Physical Exam  Constitutional: Patient appears well-developed and well-nourished. No distress.  HENT: Head: Normocephalic and atraumatic. Pulmonary/Chest: Effort normal  Musculoskeletal: right knee more swollen then left, no bruising, redness. Tenderness to inner right knee. Left knee is non-tender. Limited ROM.  Neurological: alert and oriented, speech normal.  Skin: No rash noted. No erythema.  Psychiatric: Patient has a normal mood and affect. behavior is normal. Judgment and thought content normal.    Assessment & Plan  1. Acute bilateral knee pain Going to imaging center to get xrays, discussed GI bleed  risk - DG Knee Complete 4 Views Right; Future - meloxicam (MOBIC) 7.5 MG tablet; Take once daily with food.  Dispense: 30 tablet; Refill: 0  2. Severe episode of recurrent major depressive disorder, without psychotic features ([place]) Take half dose of celexa for one week and start cymbalta then stop celexa completely. Patient denies active suicidal thoughts, daughter is taking away weapons from house. Patient verbally contracted for safety, given resources if having difficult thoughts, if having suicidal thoughts will call 911.  - DULoxetine (CYMBALTA) 30 MG capsule; Take one tablet daily for one week. Then stop celexa and continue one tablet of this daily.  Dispense: 90 capsule; Refill: 0    Follow Up  Instructions:  followup on Tuesday    I discussed the assessment and treatment plan with the patient. The patient was provided an opportunity to ask questions and all were answered. The patient agreed with the plan and demonstrated an understanding of the instructions.   The patient was advised to call back or seek an in-person evaluation if the symptoms worsen or if the condition fails to improve as anticipated.  I provided 22 minutes of non-face-to-face time during this encounter.   Fredderick Severance, NP

## 2018-11-11 ENCOUNTER — Other Ambulatory Visit: Payer: Self-pay | Admitting: Nurse Practitioner

## 2018-11-11 ENCOUNTER — Encounter: Payer: Self-pay | Admitting: Nurse Practitioner

## 2018-11-11 ENCOUNTER — Ambulatory Visit (INDEPENDENT_AMBULATORY_CARE_PROVIDER_SITE_OTHER): Payer: BLUE CROSS/BLUE SHIELD | Admitting: Nurse Practitioner

## 2018-11-11 DIAGNOSIS — M25562 Pain in left knee: Secondary | ICD-10-CM | POA: Diagnosis not present

## 2018-11-11 DIAGNOSIS — M25561 Pain in right knee: Secondary | ICD-10-CM | POA: Diagnosis not present

## 2018-11-11 DIAGNOSIS — F331 Major depressive disorder, recurrent, moderate: Secondary | ICD-10-CM | POA: Diagnosis not present

## 2018-11-11 DIAGNOSIS — K625 Hemorrhage of anus and rectum: Secondary | ICD-10-CM | POA: Diagnosis not present

## 2018-11-11 NOTE — Progress Notes (Signed)
Virtual Visit via Video Note  I connected with Lauren [name] on 11/11/18 at  2:40 PM EDT by a video enabled telemedicine application and verified that I am speaking with the correct person using two identifiers.   Staff discussed the limitations of evaluation and management by telemedicine and the availability of in person appointments. The patient expressed understanding and agreed to proceed.  Patient location: home  My location: work office Other people present:  none HPI  Knee pain, bilateral- swelling has gone down significantly and pain is much more manageable.   Noticed scant bright red blood one time when she wiped has a history of anal fissure. Denies changes in bowel movements, no excessive straining.   Depression   States overall feels much better as pain is improved. Is taking half tablet of celexa and cymbalta 30mg  will be on cymbalta 30mg  only in 2 days. Denies side effects. Does not want to increase cymbalta at this time.   PHQ2/9: Depression screen [facility] 2/9 11/11/2018 11/07/2018 09/12/2018 03/17/2018 02/07/2018  Decreased Interest 0 1 0 0 1  Down, Depressed, Hopeless 1 2 2  0 0  PHQ - 2 Score 1 3 2  0 1  Altered sleeping 0 2 1 0 -  Tired, decreased energy 0 2 1 0 -  Change in appetite 0 0 0 0 -  Feeling bad or failure about yourself  0 2 1 0 -  Trouble concentrating 0 1 0 0 -  Moving slowly or fidgety/restless 0 2 0 0 -  Suicidal thoughts 0 1 0 0 -  PHQ-9 Score 1 13 5  0 -  Difficult doing work/chores Somewhat difficult Very difficult Somewhat difficult Not difficult at all -  Some recent data might be hidden     PHQ reviewed. Positive   Patient Active Problem List   Diagnosis Date Noted  . Morbid obesity ([place]) 01/13/2018  . Allergic rhinitis, seasonal 04/23/2017  . Genital herpes 04/23/2017  . Type 2 diabetes mellitus, uncontrolled ([place]) 04/23/2017  . 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 Bronchitis 03/16/2016  . Cholesteatoma of  attic of right ear 12/05/2015  . Nocturnal cough 11/29/2015  . GERD (gastroesophageal reflux disease) 11/29/2015  . Acid reflux 09/19/2015  . Screening for breast cancer 09/19/2015  . Chronic pelvic pain in female 09/19/2015  . Vitamin D deficiency 12/13/2014  . History of herpes genitalis 12/13/2014  . Hyperlipidemia 05/26/2014  . Smoker 05/26/2014    Past Medical History:  Diagnosis Date  . Allergic rhinitis   . Anxiety   . Chronic depression   . Diabetes mellitus without complication ([place])   . Elevated LFTs   . Genital herpes   . GERD (gastroesophageal reflux disease)   . Hyperlipidemia   . Vitamin D deficiency     Past Surgical History:  Procedure Laterality Date  . APPENDECTOMY    . CESAREAN SECTION     x 4   . COLONOSCOPY    . HERNIA REPAIR  09/25/2011  . TUBAL LIGATION    . TYMPANOMASTOIDECTOMY Right 12/07/2015   Procedure: TYMPANOMASTOIDECTOMY;  Surgeon: Clyde Canterbury, MD;  Location: [facility] ORS;  Service: ENT;  Laterality: Right;    Social History   Tobacco Use  . Smoking status: Current Every Day Smoker    Packs/day: 1.00    Years: 28.00    Pack years: 28.00    Types: Cigarettes  . Smokeless tobacco: Never Used  Substance Use Topics  . Alcohol use: Yes  Alcohol/week: 0.0 standard drinks    Comment: occasionally= 3 beers per month.     Current Outpatient Medications:  .  albuterol (PROVENTIL HFA) 108 (90 Base) MCG/ACT inhaler, Inhale 2 puffs into the lungs every 6 (six) hours as needed for wheezing or shortness of breath., Disp: 1 Inhaler, Rfl: 3 .  busPIRone (BUSPAR) 7.5 MG tablet, TAKE 1 TABLET (7.5 MG TOTAL) BY MOUTH 2 (TWO) TIMES DAILY., Disp: 180 tablet, Rfl: 1 .  dapagliflozin propanediol (FARXIGA) 10 MG TABS tablet, Take 10 mg by mouth daily., Disp: , Rfl:  .  DULoxetine (CYMBALTA) 30 MG capsule, Take one tablet daily for one week. Then stop celexa and continue one tablet of this daily., Disp: 90 capsule, Rfl: 0 .  fluticasone (FLONASE) 50  MCG/ACT nasal spray, SPRAY 2 SPRAYS INTO EACH NOSTRIL EVERY DAY, Disp: 1 g, Rfl: 2 .  lisinopril (PRINIVIL,ZESTRIL) 20 MG tablet, Take 20 mg by mouth daily., Disp: , Rfl: 11 .  meloxicam (MOBIC) 7.5 MG tablet, Take once daily with food., Disp: 30 tablet, Rfl: 0 .  metFORMIN (GLUCOPHAGE-XR) 500 MG 24 hr tablet, TAKE 2 TABLETS BY MOUTH EVERY DAY, Disp: 180 tablet, Rfl: 0 .  ranitidine (ZANTAC) 150 MG tablet, Take 1 tablet (150 mg total) by mouth at bedtime., Disp: 90 tablet, Rfl: 0 .  rosuvastatin (CRESTOR) 10 MG tablet, Take 1 tablet (10 mg total) by mouth at bedtime. For cholesterol; STOP simvastatin, Disp: 90 tablet, Rfl: 0 .  sitaGLIPtin (JANUVIA) 100 MG tablet, Take 100 mg by mouth daily., Disp: , Rfl:  .  valACYclovir (VALTREX) 1000 MG tablet, TAKE 1 TABLET (1,000 MG TOTAL) BY MOUTH DAILY., Disp: 90 tablet, Rfl: 3 .  Vitamin D, Ergocalciferol, (DRISDOL) 1.25 MG (50000 UT) CAPS capsule, Take 1 capsule by mouth once a week., Disp: , Rfl:   No Known Allergies  ROS   No other specific complaints in a complete review of systems (except as listed in HPI above).  Objective  There were no vitals filed for this visit.   There is no height or weight on file to calculate BMI.  Nursing Note and Vital Signs reviewed.  Physical Exam  Constitutional: Patient appears well-developed and well-nourished. No distress.  HENT: Head: Normocephalic and atraumatic. Pulmonary/Chest: Effort normal  Neurological: alert and oriented, speech normal.  Psychiatric: Patient has a normal mood and affect. behavior is normal. Judgment and thought content normal.    Assessment & Plan  1. Acute bilateral knee pain Knees sleeve while working, referred to ortho,   2. BRBPR (bright red blood per rectum) Self-resolved, discussed stool softer if symptoms return or has changes in bowel movements will schedule in person visit.   3. Moderate episode of recurrent major depressive disorder ([place]) Much improved as  pain has improved wants to stay on 30mg  dose of cymbalta follow-up in 6 weeks to see if dose needs to increase.     Follow Up Instructions:   6 weeks cymbalta dose.   I discussed the assessment and treatment plan with the patient. The patient was provided an opportunity to ask questions and all were answered. The patient agreed with the plan and demonstrated an understanding of the instructions.   The patient was advised to call back or seek an in-person evaluation if the symptoms worsen or if the condition fails to improve as anticipated.  I provided 14 minutes of non-face-to-face time during this encounter.   Fredderick Severance, NP

## 2018-11-11 NOTE — Patient Instructions (Signed)
Recommend Anusol cream per rectum 2-3 times daily for 2 weeks Continue sitz bath Tucks pads and witch hazel cream for perianal itching and irritation If her symptoms are persistent despite above, will consider hemorrhoid banding

## 2018-11-14 DIAGNOSIS — E119 Type 2 diabetes mellitus without complications: Secondary | ICD-10-CM | POA: Diagnosis not present

## 2018-11-18 DIAGNOSIS — E782 Mixed hyperlipidemia: Secondary | ICD-10-CM | POA: Diagnosis not present

## 2018-11-18 DIAGNOSIS — E119 Type 2 diabetes mellitus without complications: Secondary | ICD-10-CM | POA: Diagnosis not present

## 2018-11-18 DIAGNOSIS — E669 Obesity, unspecified: Secondary | ICD-10-CM | POA: Diagnosis not present

## 2018-11-18 DIAGNOSIS — E559 Vitamin D deficiency, unspecified: Secondary | ICD-10-CM | POA: Diagnosis not present

## 2018-12-01 ENCOUNTER — Ambulatory Visit: Payer: Self-pay | Admitting: *Deleted

## 2018-12-01 NOTE — Telephone Encounter (Signed)
Pt called with complaints of body aches, and cough which started 11/29/2018; she also says that co-worker tested positive for COVID last week; she says that the body aches are the worst of her symptoms; recommendations made per nurse triage protocol; she verbalized understanding; the pt normally sees Dr Sanda Klein, [facility]; pt transferred to [facility] for scheduling.  Reason for Disposition . HIGH RISK patient (e.g., age > 60 years, diabetes, heart or lung disease, weak immune system)  Answer Assessment - Initial Assessment Questions 1. CLOSE CONTACT: "Who is the person with the confirmed or suspected COVID-19 infection that you were exposed to?"     work 2. PLACE of CONTACT: "Where were you when you were exposed to COVID-19?" (e.g., home, school, medical waiting room; which city?)     Work, [place] 3. TYPE of CONTACT: "How much contact was there?" (e.g., sitting next to, live in same house, work in same office, same building)     Same bldg 4. DURATION of CONTACT: "How long were you in contact with the COVID-19 patient?" (e.g., a few seconds, passed by person, a few minutes, live with the patient)    hours 5. DATE of CONTACT: "When did you have contact with a COVID-19 patient?" (e.g., how many days ago)     11/25/2018 6. TRAVEL: "Have you traveled out of the country recently?" If so, "When and where?"     * Also ask about out-of-state travel, since the CDC has identified some high-risk cities for community spread in the [place].     * Note: Travel becomes less relevant if there is widespread community transmission where the patient lives.     no 7. COMMUNITY SPREAD: "Are there lots of cases of COVID-19 (community spread) where you live?" (See public health department website, if unsure)       major 8. SYMPTOMS: "Do you have any symptoms?" (e.g., fever, cough, breathing difficulty)     Cough; loss of taste and smell; chills 9. PREGNANCY OR POSTPARTUM: "Is there any chance you are pregnant?"  "When was your last menstrual period?" "Did you deliver in the last 2 weeks?"     No LMP 5/5/202 10. HIGH RISK: "Do you have any heart or lung problems? Do you have a weak immune system?" (e.g., CHF, COPD, asthma, HIV positive, chemotherapy, renal failure, diabetes mellitus, sickle cell anemia)       diabetic  Answer Assessment - Initial Assessment Questions 1. COVID-19 DIAGNOSIS: "Who made your Coronavirus (COVID-19) diagnosis?" "Was it confirmed by a positive lab test?" If not diagnosed by a HCP, ask "Are there lots of cases (community spread) where you live?" (See public health department website, if unsure)     Major spread 2. ONSET: "When did the COVID-19 symptoms start?"     11/29/2018 3. WORST SYMPTOM: "What is your worst symptom?" (e.g., cough, fever, shortness of breath, muscle aches)    Muscle aches 4. COUGH: "Do you have a cough?" If so, ask: "How bad is the cough?"       Cough, moderate 5. FEVER: "Do you have a fever?" If so, ask: "What is your temperature, how was it measured, and when did it start?"    Pt does not have thermometer 6. RESPIRATORY STATUS: "Describe your breathing?" (e.g., shortness of breath, wheezing, unable to speak)     ok 7. BETTER-SAME-WORSE: "Are you getting better, staying the same or getting worse compared to yesterday?"  If getting worse, ask, "In what way?"     8. HIGH  RISK DISEASE: "Do you have any chronic medical problems?" (e.g., asthma, heart or lung disease, weak immune system, etc.)   daibetes 9. PREGNANCY: "Is there any chance you are pregnant?" "When was your last menstrual period?"     No LMP 11/10/2018 10. OTHER SYMPTOMS: "Do you have any other symptoms?"  (e.g., chills, fatigue, headache, loss of smell or taste, muscle pain, sore throat)      Chills, muscle aches  Protocols used: CORONAVIRUS (COVID-19) DIAGNOSED OR SUSPECTED-A-AH, CORONAVIRUS (COVID-19) EXPOSURE-A-AH

## 2018-12-01 NOTE — Telephone Encounter (Signed)
Pt has appt tomorrow

## 2018-12-02 ENCOUNTER — Other Ambulatory Visit: Payer: Self-pay

## 2018-12-02 ENCOUNTER — Encounter: Payer: Self-pay | Admitting: Family Medicine

## 2018-12-02 ENCOUNTER — Ambulatory Visit (INDEPENDENT_AMBULATORY_CARE_PROVIDER_SITE_OTHER): Payer: BC Managed Care – PPO | Admitting: Family Medicine

## 2018-12-02 ENCOUNTER — Telehealth: Payer: Self-pay

## 2018-12-02 ENCOUNTER — Telehealth: Payer: Self-pay | Admitting: *Deleted

## 2018-12-02 DIAGNOSIS — F17200 Nicotine dependence, unspecified, uncomplicated: Secondary | ICD-10-CM | POA: Diagnosis not present

## 2018-12-02 DIAGNOSIS — R6889 Other general symptoms and signs: Secondary | ICD-10-CM

## 2018-12-02 DIAGNOSIS — Z20822 Contact with and (suspected) exposure to covid-19: Secondary | ICD-10-CM

## 2018-12-02 DIAGNOSIS — E1165 Type 2 diabetes mellitus with hyperglycemia: Secondary | ICD-10-CM

## 2018-12-02 MED ORDER — BENZONATATE 100 MG PO CAPS: 100.0000 mg | ORAL_CAPSULE | Freq: Three times a day (TID) | ORAL | 0 refills | Status: DC | PRN

## 2018-12-02 NOTE — Progress Notes (Signed)
Name: Lauren [name]   MRN: [id number]    DOB: 1975-12-04   Date:12/02/2018       Progress Note  Subjective  Chief Complaint  Chief Complaint  Patient presents with  . URI    exsposed to covid at work,  cough, diarrhea, body aches and chills    I connected with  DEVETTA HAGENOW  on 12/02/18 at  1:40 PM EDT by a video enabled telemedicine application and verified that I am speaking with the correct person using two identifiers.  I discussed the limitations of evaluation and management by telemedicine and the availability of in person appointments. The patient expressed understanding and agreed to proceed. Staff also discussed with the patient that there may be a patient responsible charge related to this service. Patient Location: Home Provider Location: Office Additional Individuals present: None  HPI  Pt presents with concern for possible COVID-19.  Someone tested positive at work, though she does not think she had direct contact with that employee. She has been symptomatic for 8 days.  Her symptoms include body aches, chills, some chest tightness (relieved with inhaler), some shortness of breath, occasional diarrhea (none today), some loss of taste or smell, no vomiting. Is a current smoker, is morbidly obese, and is diabetic and has been poorly controlled in the past (BG's have been in the 140's).  Discussed interplay of DM with viral illness and increase risk for complications.  Thermometer is not working, but has not felt feverish for the last few days.  Patient Active Problem List   Diagnosis Date Noted  . Morbid obesity ([place]) 01/13/2018  . Allergic rhinitis, seasonal 04/23/2017  . Genital herpes 04/23/2017  . Type 2 diabetes mellitus, uncontrolled ([place]) 04/23/2017  . 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 ([place]) 05/17/2016  . Bronchitis 03/16/2016  . Cholesteatoma of attic of right ear 12/05/2015  . Nocturnal cough 11/29/2015  . GERD (gastroesophageal reflux disease)  11/29/2015  . Acid reflux 09/19/2015  . Screening for breast cancer 09/19/2015  . Chronic pelvic pain in female 09/19/2015  . Vitamin D deficiency 12/13/2014  . History of herpes genitalis 12/13/2014  . Hyperlipidemia 05/26/2014  . Smoker 05/26/2014    Social History   Tobacco Use  . Smoking status: Current Every Day Smoker    Packs/day: 1.00    Years: 28.00    Pack years: 28.00    Types: Cigarettes  . Smokeless tobacco: Never Used  Substance Use Topics  . Alcohol use: Yes    Alcohol/week: 0.0 standard drinks    Comment: occasionally= 3 beers per month.     Current Outpatient Medications:  .  albuterol (PROVENTIL HFA) 108 (90 Base) MCG/ACT inhaler, Inhale 2 puffs into the lungs every 6 (six) hours as needed for wheezing or shortness of breath., Disp: 1 Inhaler, Rfl: 3 .  busPIRone (BUSPAR) 7.5 MG tablet, TAKE 1 TABLET (7.5 MG TOTAL) BY MOUTH 2 (TWO) TIMES DAILY., Disp: 180 tablet, Rfl: 1 .  dapagliflozin propanediol (FARXIGA) 10 MG TABS tablet, Take 10 mg by mouth daily., Disp: , Rfl:  .  DULoxetine (CYMBALTA) 30 MG capsule, Take one tablet daily for one week. Then stop celexa and continue one tablet of this daily., Disp: 90 capsule, Rfl: 0 .  fluticasone (FLONASE) 50 MCG/ACT nasal spray, SPRAY 2 SPRAYS INTO EACH NOSTRIL EVERY DAY, Disp: 1 g, Rfl: 2 .  lisinopril (PRINIVIL,ZESTRIL) 20 MG tablet, Take 20 mg by mouth daily., Disp: , Rfl: 11 .  meloxicam (MOBIC) 7.5  MG tablet, Take once daily with food., Disp: 30 tablet, Rfl: 0 .  metFORMIN (GLUCOPHAGE-XR) 500 MG 24 hr tablet, TAKE 2 TABLETS BY MOUTH EVERY DAY, Disp: 180 tablet, Rfl: 0 .  ranitidine (ZANTAC) 150 MG tablet, Take 1 tablet (150 mg total) by mouth at bedtime., Disp: 90 tablet, Rfl: 0 .  rosuvastatin (CRESTOR) 10 MG tablet, Take 1 tablet (10 mg total) by mouth at bedtime. For cholesterol; STOP simvastatin, Disp: 90 tablet, Rfl: 0 .  sitaGLIPtin (JANUVIA) 100 MG tablet, Take 100 mg by mouth daily., Disp: , Rfl:  .   valACYclovir (VALTREX) 1000 MG tablet, TAKE 1 TABLET (1,000 MG TOTAL) BY MOUTH DAILY., Disp: 90 tablet, Rfl: 3 .  Vitamin D, Ergocalciferol, (DRISDOL) 1.25 MG (50000 UT) CAPS capsule, Take 1 capsule by mouth once a week., Disp: , Rfl:   No Known Allergies  I personally reviewed active problem list, medication list, allergies, notes from last encounter, lab results with the patient/caregiver today.  ROS  Ten systems reviewed and is negative except as mentioned in HPI  Objective  Virtual encounter, vitals not obtained.  There is no height or weight on file to calculate BMI.  Nursing Note and Vital Signs reviewed.  Physical Exam  Constitutional: Patient appears well-developed and well-nourished. No distress.  HENT: Head: Normocephalic and atraumatic.  Neck: Normal range of motion. Pulmonary/Chest: Effort normal. No respiratory distress. Speaking in complete sentences Neurological: Pt is alert and oriented to person, place, and time. Coordination, speech are normal.  Psychiatric: Patient has a normal mood and affect. behavior is normal. Judgment and thought content normal.  No results found for this or any previous visit (from the past 72 hour(s)).  Assessment & Plan  1. Suspected Covid-19 Virus Infection - Risk factors as below for complications, will send for testing through [facility].  Work note per [employer] is created.  Check BG's regularly, check temp daily, follow up if worsening or not improving in 3-5 days. - benzonatate (TESSALON PERLES) 100 MG capsule; Take 1 capsule (100 mg total) by mouth 3 (three) times daily as needed.  Dispense: 30 capsule; Refill: 0  2. Uncontrolled type 2 diabetes mellitus with hyperglycemia (HCC) 3. Smoker 4. Morbid obesity ([place])   -Red flags and when to present for emergency care or RTC including fever >101.15F, chest pain, shortness of breath, new/worsening/un-resolving symptoms, reviewed with patient at time of visit.  Follow up and care instructions discussed and provided in AVS. - I discussed the assessment and treatment plan with the patient. The patient was provided an opportunity to ask questions and all were answered. The patient agreed with the plan and demonstrated an understanding of the instructions.  I provided 16 minutes of non-face-to-face time during this encounter.  Hubbard Hartshorn, FNP

## 2018-12-02 NOTE — Telephone Encounter (Signed)
Copied from [place] [id number]. Topic: General - Other >> Dec 02, 2018  3:20 PM Yvette Rack wrote: Reason for CRM: Pt asked that the work note be faxed to [phone]. Pt stated the last day she worked was November 24, 2018

## 2018-12-02 NOTE — Telephone Encounter (Signed)
Patient was seen for virtual visit today by Raelyn Ensign, FNP.  She would like patient to be tested for Covid for the following symptoms: exposure to positive patient, cough, diarrhea, body aches and chills.

## 2018-12-02 NOTE — Telephone Encounter (Signed)
Pt scheduled for covid testing at [facility] site for tomorrow am.   Patient was seen for virtual visit today by Raelyn Ensign, FNP.  She would like patient to be tested for Covid for the following symptoms: exposure to positive patient, cough, diarrhea, body aches and chills.  Testing process reviewed with pt,stay in car, wear mask as well as any passengers in vehicle.Pt verbalizes understanding.

## 2018-12-03 ENCOUNTER — Other Ambulatory Visit: Payer: Self-pay | Admitting: Nurse Practitioner

## 2018-12-03 ENCOUNTER — Other Ambulatory Visit: Payer: BLUE CROSS/BLUE SHIELD

## 2018-12-03 DIAGNOSIS — E782 Mixed hyperlipidemia: Secondary | ICD-10-CM

## 2018-12-03 DIAGNOSIS — Z20822 Contact with and (suspected) exposure to covid-19: Secondary | ICD-10-CM

## 2018-12-03 DIAGNOSIS — M25561 Pain in right knee: Secondary | ICD-10-CM

## 2018-12-03 DIAGNOSIS — K219 Gastro-esophageal reflux disease without esophagitis: Secondary | ICD-10-CM

## 2018-12-03 DIAGNOSIS — M25562 Pain in left knee: Secondary | ICD-10-CM

## 2018-12-03 NOTE — Telephone Encounter (Signed)
This has already been done.

## 2018-12-04 LAB — NOVEL CORONAVIRUS, NAA: SARS-CoV-2, NAA: NOT DETECTED

## 2018-12-12 ENCOUNTER — Telehealth: Payer: Self-pay | Admitting: Family Medicine

## 2018-12-12 NOTE — Telephone Encounter (Signed)
Pt says that she was seen by Raelyn Ensign and was taken out of work due to [employer] exposure. Pt would like to return to work on 12/15/18 (Monday) pt would like to have note faxed to her employer at:    Fax [phone]

## 2018-12-15 ENCOUNTER — Encounter: Payer: Self-pay | Admitting: Family Medicine

## 2018-12-15 NOTE — Telephone Encounter (Signed)
Letter up front for pick up. 

## 2018-12-28 ENCOUNTER — Other Ambulatory Visit: Payer: Self-pay | Admitting: Nurse Practitioner

## 2018-12-28 DIAGNOSIS — J3489 Other specified disorders of nose and nasal sinuses: Secondary | ICD-10-CM

## 2018-12-29 DIAGNOSIS — M25562 Pain in left knee: Secondary | ICD-10-CM | POA: Diagnosis not present

## 2018-12-29 DIAGNOSIS — M8949 Other hypertrophic osteoarthropathy, multiple sites: Secondary | ICD-10-CM | POA: Diagnosis not present

## 2018-12-29 DIAGNOSIS — M25561 Pain in right knee: Secondary | ICD-10-CM | POA: Diagnosis not present

## 2019-01-12 DIAGNOSIS — S59902A Unspecified injury of left elbow, initial encounter: Secondary | ICD-10-CM | POA: Diagnosis not present

## 2019-01-12 DIAGNOSIS — W010XXA Fall on same level from slipping, tripping and stumbling without subsequent striking against object, initial encounter: Secondary | ICD-10-CM | POA: Diagnosis not present

## 2019-01-12 DIAGNOSIS — S52125A Nondisplaced fracture of head of left radius, initial encounter for closed fracture: Secondary | ICD-10-CM | POA: Diagnosis not present

## 2019-01-13 ENCOUNTER — Ambulatory Visit
Admission: EM | Admit: 2019-01-13 | Discharge: 2019-01-13 | Disposition: A | Discharge status: Home / Self Care | Payer: Worker's Compensation

## 2019-01-13 ENCOUNTER — Other Ambulatory Visit: Payer: Self-pay

## 2019-01-13 DIAGNOSIS — M25422 Effusion, left elbow: Secondary | ICD-10-CM

## 2019-01-13 DIAGNOSIS — Z042 Encounter for examination and observation following work accident: Secondary | ICD-10-CM | POA: Diagnosis not present

## 2019-01-13 DIAGNOSIS — S52125D Nondisplaced fracture of head of left radius, subsequent encounter for closed fracture with routine healing: Secondary | ICD-10-CM | POA: Diagnosis not present

## 2019-01-13 DIAGNOSIS — Z021 Encounter for pre-employment examination: Secondary | ICD-10-CM | POA: Diagnosis not present

## 2019-01-13 NOTE — ED Triage Notes (Signed)
Patient states that she injured her arm while she was at work yesterday. Patient states that she fell after being tripped by plastic off of a pallet around 530am. Patient reports that pain is mostly at left elbow. Patient was seen at [facility] walk in last night and diagnosed with fractured radial head. Patient is here for Workers Comp.

## 2019-01-13 NOTE — ED Provider Notes (Signed)
[facility] ____________________________________________  Time seen: Approximately 10:25 AM  I have reviewed the triage vital signs and the nursing notes.   HISTORY  Chief Complaint Work Related Injury and Elbow Pain (left)  HPI Lauren [name] is a 43 y.o. female presenting for evaluation of left elbow pain post injury.  Reports this is a reevaluation since yesterday due to [employer].  Patient was at work yesterday, fell injuring left elbow and was seen by her primary doctor's office and diagnosed with left elbow radial head fracture.  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  Patient has an appointment with occupational health for this afternoon however unable to keep this appointment due to her son's graduation.  We will follow-up with occupational health per her employer tomorrow for drug test.  Patient is currently in Ortho-Glass splint and tolerating well.  Patient was given tramadol prescription yesterday for as needed for breakthrough pain and taken Tylenol or ibuprofen as needed.  States pain right now is mild.  Denies paresthesias, pain radiation, head injury, loss of consciousness or other injuries.  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  States she was unable to fully extend her left elbow yesterday.  Reports otherwise doing well.  No fever, cough, chest pain, shortness of breath.   Arnetha Courser, MD: PCP  Past Medical History:  Diagnosis Date  . Allergic rhinitis   . Anxiety   . Chronic depression   . Diabetes mellitus without complication ([place])   . Elevated LFTs   . Genital herpes   . GERD (gastroesophageal reflux disease)   . Hyperlipidemia   . Vitamin D deficiency     Patient Active Problem List    Diagnosis Date Noted  . Morbid obesity ([place]) 01/13/2018  . Allergic rhinitis, seasonal 04/23/2017  . Genital herpes 04/23/2017  . Type 2 diabetes mellitus, uncontrolled ([place]) 04/23/2017  . 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 ([place]) 05/17/2016  . Bronchitis 03/16/2016  . Cholesteatoma of attic of right ear 12/05/2015  . Nocturnal cough 11/29/2015  . GERD (gastroesophageal reflux disease) 11/29/2015  . Acid reflux 09/19/2015  . Screening for breast cancer 09/19/2015  . Chronic pelvic pain in female 09/19/2015  . Vitamin D deficiency 12/13/2014  . History of herpes genitalis 12/13/2014  . Hyperlipidemia 05/26/2014  . Smoker 05/26/2014    Past Surgical History:  Procedure Laterality Date  . APPENDECTOMY    . CESAREAN SECTION     x 4   . COLONOSCOPY    . HERNIA REPAIR  09/25/2011  . TUBAL LIGATION    . TYMPANOMASTOIDECTOMY Right 12/07/2015   Procedure: TYMPANOMASTOIDECTOMY;  Surgeon: Clyde Canterbury, MD;  Location: [facility] ORS;  Service: ENT;  Laterality: Right;     No current facility-administered medications for this encounter.   Current Outpatient Medications:  .  albuterol (PROVENTIL HFA) 108 (90 Base) MCG/ACT inhaler, Inhale 2 puffs into the lungs every 6 (six) hours as needed for wheezing or shortness of breath., Disp: 1 Inhaler, Rfl: 3 .  busPIRone (BUSPAR) 7.5 MG tablet, TAKE 1 TABLET (7.5 MG TOTAL) BY MOUTH 2 (TWO) TIMES DAILY., Disp: 180 tablet, Rfl: 1 .  dapagliflozin propanediol (FARXIGA) 10 MG TABS tablet, Take 10 mg by mouth daily., Disp: , Rfl:  .  DULoxetine (CYMBALTA) 30 MG capsule, Take one tablet daily for one week. Then stop celexa and continue one tablet of this daily., Disp: 90 capsule, Rfl: 0 .  famotidine (PEPCID) 20 MG tablet, Take 1 tablet (20 mg total) by mouth at bedtime., Disp: 90 tablet, Rfl: 0 .  fluticasone (FLONASE) 50 MCG/ACT nasal spray, SPRAY 2 SPRAYS INTO EACH NOSTRIL EVERY DAY, Disp: 48 mL, Rfl: 0 .  lisinopril (PRINIVIL,ZESTRIL) 20 MG  tablet, Take 20 mg by mouth daily., Disp: , Rfl: 11 .  metFORMIN (GLUCOPHAGE-XR) 500 MG 24 hr tablet, TAKE 2 TABLETS BY MOUTH EVERY DAY, Disp: 180 tablet, Rfl: 0 .  rosuvastatin (CRESTOR) 10 MG tablet, TAKE 1 TABLET (10 MG TOTAL) BY MOUTH AT BEDTIME. FOR CHOLESTEROL STOP SIMVASTATIN, Disp: 90 tablet, Rfl: 0 .  sitaGLIPtin (JANUVIA) 100 MG tablet, Take 100 mg by mouth daily., Disp: , Rfl:  .  traMADol (ULTRAM) 50 MG tablet, Take by mouth., Disp: , Rfl:  .  valACYclovir (VALTREX) 1000 MG tablet, TAKE 1 TABLET (1,000 MG TOTAL) BY MOUTH DAILY., Disp: 90 tablet, Rfl: 3 .  Vitamin D, Ergocalciferol, (DRISDOL) 1.25 MG (50000 UT) CAPS capsule, Take 1 capsule by mouth once a week., Disp: , Rfl:  .  benzonatate (TESSALON PERLES) 100 MG capsule, Take 1 capsule (100 mg total) by mouth 3 (three) times daily as needed., Disp: 30 capsule, Rfl: 0 .  meloxicam (MOBIC) 7.5 MG tablet, TAKE 1 TABLET BY MOUTH EVERY DAY WITH FOOD, Disp: 30 tablet, Rfl: 0  Allergies Patient has no known allergies.  Family History  Problem Relation Age of Onset  . Hypertension Mother   . Hyperlipidemia Mother   . Diabetes Mother   . Arrhythmia Father 68       A-fib  . Alcohol abuse Father   . Heart disease Brother 43       stent x 1   . Alcoholism Brother   . Asthma Son   . Asperger's syndrome Son     Social History Social History   Tobacco Use  . Smoking status: Current Every Day Smoker    Packs/day: 1.00    Years: 28.00    Pack years: 28.00    Types: Cigarettes  . Smokeless tobacco: Never Used  Substance Use Topics  . Alcohol use: Not Currently    Alcohol/week: 0.0 standard drinks  . Drug use: No    Review of Systems Constitutional: No fever ENT: No sore throat. Cardiovascular: Denies chest pain. Respiratory: Denies shortness of breath. Gastrointestinal: No abdominal pain.   Musculoskeletal: Positive left elbow pain. Skin: Negative for rash. Neurological: Negative for head injury.    ____________________________________________   PHYSICAL EXAM:  VITAL SIGNS: ED Triage Vitals  Enc Vitals Group     BP 01/13/19 1010 133/86     Pulse Rate 01/13/19 1010 76     Resp 01/13/19 1010 17     Temp 01/13/19 1010 98.9 F (37.2 C)     Temp Source 01/13/19 1010 Oral     SpO2 01/13/19 1010 100 %     Weight 01/13/19 1007 205 lb (93 kg)     Height 01/13/19 1007 5\' 4"  (1.626 m)     Head Circumference --      Peak Flow --      Pain Score 01/13/19 1007 8     Pain Loc --      Pain Edu? --      Excl. in [place]? --     Constitutional: Alert and  oriented. Well appearing and in no acute distress. Eyes: Conjunctivae are normal. ENT      Head: Normocephalic and atraumatic. Hematological/Lymphatic/Immunilogical: No cervical lymphadenopathy. Cardiovascular: Normal rate, regular rhythm. Grossly normal heart sounds.  Good peripheral circulation. Respiratory: Normal respiratory effort without tachypnea nor retractions. Breath sounds are clear and equal bilaterally. No wheezes, rales, rhonchi. Musculoskeletal: Steady gait. Except: Left arm and posterior OCL splint extending from mid humerus through left hand.  Bilateral hand grip strong and equal.  Left hand normal distal sensation and capillary refill, normal left hand coloration, left shoulder nontender, no paresthesias, able to thumb touch to each finger. Neurologic:  Normal speech and language.Speech is normal. No gait instability.  Skin:  Skin is warm, dry and intact. No rash noted. Psychiatric: Mood and affect are normal. Speech and behavior are normal. Patient exhibits appropriate insight and judgment   ___________________________________________   LABS (all labs ordered are listed, but only abnormal results are displayed)  Labs Reviewed - No data to display  RADIOLOGY  No results found.   Left elbow x-ray dictated radiology report received from [facility] clinic via fax. Left elbow x-ray completed on 01/12/2019, dictated by  radiologist 01/13/2019 9:28 AM.  Left elbow x-ray acute nondisplaced fracture of the radial head with associated elbow effusion, dictated by Dr. Sandi Mariscal. ____________________________________________   PROCEDURES   INITIAL IMPRESSION / [place] / ED COURSE  Pertinent labs & imaging results that were available during my care of the patient were reviewed by me and considered in my medical decision making (see chart for details).  Well-appearing patient.  No acute distress.  Patient with known left radial head fracture from x-ray yesterday.  Patient was seen by her primary doctor's office and splinted.  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  Patient was also given prescription for tramadol as needed for breakthrough pain.  Patient correctly splinted and OCL long-arm splint.  No new injuries.  Unable to visualize x-ray but was able to receive x-ray report.  Left elbow x-ray nondisplaced fracture of the left radial head with associated elbow effusion.  Directed to continue to keep in splint, ice, elevate.  Continue medications as directed.  Workers comp form filled out as patient needs to keep in splint, no lifting, pushing, pulling or use of left arm.  Directed to follow-up with occupational health.  Reported that employer is arranging reschedule appointment for occupational health for tomorrow.  Patient directed to follow this.  Patient agreed to plan.  Discussed follow up and return parameters including no resolution or any worsening concerns. Patient verbalized understanding and agreed to plan.   ____________________________________________   FINAL CLINICAL IMPRESSION(S) / ED DIAGNOSES  Final diagnoses:  Closed nondisplaced fracture of head of left radius with routine healing, subsequent encounter  Effusion of left elbow     ED Discharge Orders    None       Note: This dictation was prepared with Dragon dictation along with smaller phrase technology. Any  transcriptional errors that result from this process are unintentional.         Marylene Land, NP 01/13/19 1058

## 2019-01-13 NOTE — Discharge Instructions (Signed)
Take medication as prescribed yesterday as needed. Rest. Drink plenty of fluids.  Keep in splint.  Ice.  Elevate.  Follow-up with occupational health tomorrow as directed by your employer.See above.   Follow up with your primary care physician this week as needed. Return to Urgent care for new or worsening concerns.

## 2019-01-17 ENCOUNTER — Other Ambulatory Visit: Payer: Self-pay | Admitting: Nurse Practitioner

## 2019-01-17 DIAGNOSIS — M25561 Pain in right knee: Secondary | ICD-10-CM

## 2019-01-17 DIAGNOSIS — E1165 Type 2 diabetes mellitus with hyperglycemia: Secondary | ICD-10-CM

## 2019-01-17 DIAGNOSIS — M25562 Pain in left knee: Secondary | ICD-10-CM

## 2019-01-22 DIAGNOSIS — E119 Type 2 diabetes mellitus without complications: Secondary | ICD-10-CM | POA: Diagnosis not present

## 2019-01-22 DIAGNOSIS — E669 Obesity, unspecified: Secondary | ICD-10-CM | POA: Diagnosis not present

## 2019-01-22 DIAGNOSIS — M25522 Pain in left elbow: Secondary | ICD-10-CM | POA: Diagnosis not present

## 2019-01-22 DIAGNOSIS — S52125A Nondisplaced fracture of head of left radius, initial encounter for closed fracture: Secondary | ICD-10-CM | POA: Diagnosis not present

## 2019-01-28 ENCOUNTER — Other Ambulatory Visit: Payer: Self-pay | Admitting: Nurse Practitioner

## 2019-01-28 DIAGNOSIS — F332 Major depressive disorder, recurrent severe without psychotic features: Secondary | ICD-10-CM

## 2019-01-28 NOTE — Telephone Encounter (Signed)
Please schedule routine follow-up in 3 months

## 2019-01-28 NOTE — Telephone Encounter (Signed)
lvm to sch 66m fu

## 2019-02-05 DIAGNOSIS — E119 Type 2 diabetes mellitus without complications: Secondary | ICD-10-CM | POA: Diagnosis not present

## 2019-02-05 DIAGNOSIS — S52125A Nondisplaced fracture of head of left radius, initial encounter for closed fracture: Secondary | ICD-10-CM | POA: Diagnosis not present

## 2019-02-05 DIAGNOSIS — M25522 Pain in left elbow: Secondary | ICD-10-CM | POA: Diagnosis not present

## 2019-02-05 DIAGNOSIS — E669 Obesity, unspecified: Secondary | ICD-10-CM | POA: Diagnosis not present

## 2019-02-12 ENCOUNTER — Other Ambulatory Visit: Payer: Self-pay | Admitting: Nurse Practitioner

## 2019-02-12 DIAGNOSIS — F32A Depression, unspecified: Secondary | ICD-10-CM

## 2019-02-12 DIAGNOSIS — F329 Major depressive disorder, single episode, unspecified: Secondary | ICD-10-CM

## 2019-02-12 NOTE — Telephone Encounter (Signed)
Please schedule routine follow-up in 3 months  

## 2019-02-17 NOTE — Telephone Encounter (Signed)
lvm to sch appt °

## 2019-02-18 ENCOUNTER — Other Ambulatory Visit: Payer: Self-pay | Admitting: Nurse Practitioner

## 2019-02-18 DIAGNOSIS — M25561 Pain in right knee: Secondary | ICD-10-CM

## 2019-02-18 DIAGNOSIS — M25562 Pain in left knee: Secondary | ICD-10-CM

## 2019-02-26 DIAGNOSIS — S52125D Nondisplaced fracture of head of left radius, subsequent encounter for closed fracture with routine healing: Secondary | ICD-10-CM | POA: Diagnosis not present

## 2019-02-26 DIAGNOSIS — E119 Type 2 diabetes mellitus without complications: Secondary | ICD-10-CM | POA: Diagnosis not present

## 2019-02-26 DIAGNOSIS — E669 Obesity, unspecified: Secondary | ICD-10-CM | POA: Diagnosis not present

## 2019-03-11 ENCOUNTER — Encounter: Payer: Self-pay | Admitting: Family Medicine

## 2019-03-11 ENCOUNTER — Ambulatory Visit (INDEPENDENT_AMBULATORY_CARE_PROVIDER_SITE_OTHER): Payer: BC Managed Care – PPO | Admitting: Family Medicine

## 2019-03-11 DIAGNOSIS — B349 Viral infection, unspecified: Secondary | ICD-10-CM | POA: Diagnosis not present

## 2019-03-11 DIAGNOSIS — K529 Noninfective gastroenteritis and colitis, unspecified: Secondary | ICD-10-CM

## 2019-03-11 NOTE — Progress Notes (Signed)
Name: Lauren [name]   MRN: [id number]    DOB: Sep 10, 1975   Date:03/11/2019       Progress Note  Subjective:    I connected with  Lauren [name]  on 03/11/19 at  3:00 PM EDT by a video enabled telemedicine application and verified that I am speaking with the correct person using two identifiers.  I discussed the limitations of evaluation and management by telemedicine and the availability of in person appointments. The patient expressed understanding and agreed to proceed. Staff also discussed with the patient that there may be a patient responsible charge related to this service. Patient Location: home Provider Location: [facility] clinic Additional Individuals present: none  Chief Complaint  Chief Complaint  Patient presents with  . GI Problem    body aches, headaches, diarrhea, vomiting, onset wednesday of last week   She had GI sx body aches start about one week ago, with vomiting and diarrhea for about 4 days.  She is not tolerating PO's drinking fluid and today had crackers apple sauce.  No vomiting for more than 24 hours, and loose stools in the last 24 hours has been 4.  She was have urgent diarrhea immediately after eating but today that is better, after last foods no BM She had some abd cramping but that is getting better and she attributes that to her prior She had body aches and they are a little better but she still feels weak. She had subjective fever chills, sweats and was "out of it" over the weekend.   Blood sugars with DM - high of 206, but comes down after she takes her meds Denies urinary sx She is feeling better and advancing diet but she requires a COVID test from her job and/or has to be out for 14 days.    Patient Active Problem List   Diagnosis Date Noted  . Morbid obesity ([place]) 01/13/2018  . Allergic rhinitis, seasonal 04/23/2017  . Genital herpes 04/23/2017  . Type 2 diabetes mellitus, uncontrolled ([place]) 04/23/2017  . 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 Bronchitis 03/16/2016  . Cholesteatoma of attic of right ear 12/05/2015  . Nocturnal cough 11/29/2015  . GERD (gastroesophageal reflux disease) 11/29/2015  . Acid reflux 09/19/2015  . Screening for breast cancer 09/19/2015  . Chronic pelvic pain in female 09/19/2015  . Vitamin D deficiency 12/13/2014  . History of herpes genitalis 12/13/2014  . Hyperlipidemia 05/26/2014  . Smoker 05/26/2014    Social History   Tobacco Use  . Smoking status: Current Every Day Smoker    Packs/day: 1.00    Years: 28.00    Pack years: 28.00    Types: Cigarettes  . Smokeless tobacco: Never Used  Substance Use Topics  . Alcohol use: Not Currently    Alcohol/week: 0.0 standard drinks     Current Outpatient Medications:  .  albuterol (PROVENTIL HFA) 108 (90 Base) MCG/ACT inhaler, Inhale 2 puffs into the lungs every 6 (six) hours as needed for wheezing or shortness of breath., Disp: 1 Inhaler, Rfl: 3 .  busPIRone (BUSPAR) 7.5 MG tablet, TAKE 1 TABLET (7.5 MG TOTAL) BY MOUTH 2 (TWO) TIMES DAILY., Disp: 180 tablet, Rfl: 1 .  dapagliflozin propanediol (FARXIGA) 10 MG TABS tablet, Take 10 mg by mouth daily., Disp: , Rfl:  .  DULoxetine (CYMBALTA) 30 MG capsule, Take 1 capsule (30 mg total) by mouth daily., Disp: 90 capsule, Rfl: 0 .  famotidine (PEPCID) 20 MG tablet, Take 1  tablet (20 mg total) by mouth at bedtime., Disp: 90 tablet, Rfl: 0 .  fluticasone (FLONASE) 50 MCG/ACT nasal spray, SPRAY 2 SPRAYS INTO EACH NOSTRIL EVERY DAY, Disp: 48 mL, Rfl: 0 .  lisinopril (PRINIVIL,ZESTRIL) 20 MG tablet, Take 20 mg by mouth daily., Disp: , Rfl: 11 .  meloxicam (MOBIC) 7.5 MG tablet, TAKE 1 TABLET BY MOUTH EVERY DAY WITH FOOD, Disp: 30 tablet, Rfl: 0 .  metFORMIN (GLUCOPHAGE-XR) 500 MG 24 hr tablet, TAKE 2 TABLETS BY MOUTH EVERY DAY, Disp: 180 tablet, Rfl: 0 .  rosuvastatin (CRESTOR) 10 MG tablet, TAKE 1 TABLET (10 MG TOTAL) BY MOUTH AT BEDTIME. FOR CHOLESTEROL STOP SIMVASTATIN, Disp:  90 tablet, Rfl: 0 .  sitaGLIPtin (JANUVIA) 100 MG tablet, Take 100 mg by mouth daily., Disp: , Rfl:  .  valACYclovir (VALTREX) 1000 MG tablet, TAKE 1 TABLET (1,000 MG TOTAL) BY MOUTH DAILY., Disp: 90 tablet, Rfl: 3 .  benzonatate (TESSALON PERLES) 100 MG capsule, Take 1 capsule (100 mg total) by mouth 3 (three) times daily as needed. (Patient not taking: Reported on 03/11/2019), Disp: 30 capsule, Rfl: 0 .  Vitamin D, Ergocalciferol, (DRISDOL) 1.25 MG (50000 UT) CAPS capsule, Take 1 capsule by mouth once a week., Disp: , Rfl:   No Known Allergies  I personally reviewed active problem list, medication list, allergies, family history, social history, lab results with the patient/caregiver today.  Review of Systems  Constitutional: Negative.   HENT: Negative.   Eyes: Negative.   Respiratory: Negative.   Cardiovascular: Negative.   Gastrointestinal: Negative.   Endocrine: Negative.   Genitourinary: Negative.   Musculoskeletal: Negative.   Skin: Negative.   Allergic/Immunologic: Negative.   Neurological: Negative.   Hematological: Negative.   Psychiatric/Behavioral: Negative.   All other systems reviewed and are negative.   Objective:   Virtual encounter, vitals limited, only able to obtain the following There were no vitals filed for this visit. There is no height or weight on file to calculate BMI. Nursing Note and Vital Signs reviewed.  Physical Exam Vitals signs and nursing note reviewed.  Constitutional:      General: She is not in acute distress.    Appearance: She is well-developed. She is obese. She is not ill-appearing, toxic-appearing or diaphoretic.     Comments: Alert, non-toxic appearing female, NAD  HENT:     Head: Normocephalic and atraumatic.     Nose: Nose normal.  Eyes:     General:        Right eye: No discharge.        Left eye: No discharge.     Conjunctiva/sclera: Conjunctivae normal.  Neck:     Trachea: No tracheal deviation.  Cardiovascular:      Rate and Rhythm: Normal rate and regular rhythm.  Pulmonary:     Effort: Pulmonary effort is normal. No respiratory distress.     Breath sounds: No stridor.  Musculoskeletal: Normal range of motion.  Skin:    Coloration: Skin is not jaundiced or pale.     Findings: No rash.  Neurological:     Mental Status: She is alert.     Motor: No abnormal muscle tone.     Coordination: Coordination normal.  Psychiatric:        Mood and Affect: Mood normal.        Behavior: Behavior normal.     PE limited by telephone encounter  No results found for this or any previous visit (from the past 72 hour(s)).  Assessment and  Plan:     ICD-10-CM   1. Gastroenteritis  K52.9 Novel Coronavirus, NAA ([facility])  2. Viral illness  B34.9 Novel Coronavirus, NAA ([facility])    Pt with viral/GI sx starting 03/04/2019, she does not currently have any abdominal pain she is no longer vomiting she is tolerating fluids and solids and slowly progressing her diet.  She is still having frequent loose stools and we discussed that she is unable to return to work until she has less than 3 watery stools per day for more than 24 hours.  Her emesis was nonbloody nonbilious, no hematochezia or melena, blood sugars are mildly elevated but not concerning to return to normal with her medications, her energy is improving and she is no longer having any fever sweats or chills. She will go to get a COVID test tomorrow.  If negative she will be able to return to work once she has slightly improved diarrhea.  Encouraged her to sign up for my chart so I can send her work note to her and test results to her. Encouraged her to have somebody else drop off her paperwork from her job if they require FMLA to be filled out.  We discussed at length concerning signs and symptoms which she should follow-up in office, or more urgent signs and symptoms which require ER or urgent care evaluation where they can get labs and do a in person exam with proper  PPE.  Patient verbalized understanding.   - I discussed the assessment and treatment plan with the patient. The patient was provided an opportunity to ask questions and all were answered. The patient agreed with the plan and demonstrated an understanding of the instructions.  I provided 20 minutes of non-face-to-face time during this encounter.  [name], PA-C 03/11/19 3:25 PM

## 2019-03-11 NOTE — Patient Instructions (Signed)
Slowly advance diet Can return to work if [place] test is negative  AND No fever for > 24 hours No vomiting for > 24 hours Less than 3 loose to watery stools/day for > 48 hours  I will send work notes through [employer] if able to sign up for it - links sent to email and through text   Viral Gastroenteritis, Adult  Viral gastroenteritis is also known as the stomach flu. This condition may affect your stomach, your small intestine, and your large intestine. It can cause sudden watery poop (diarrhea), fever, and throwing up (vomiting). This condition is caused by certain germs (viruses). These germs can be passed from person to person very easily (are contagious). Having watery poop and throwing up can make you feel weak and cause you to not have enough water in your body (get dehydrated). This can make you tired and thirsty, make you have a dry mouth, and make it so you pee (urinate) less often. It is important to replace the fluids that you lose from having watery poop and throwing up. What are the causes?  You can get sick by catching viruses from other people.  You can also get sick by: ? Eating food, drinking water, or touching a surface that has the viruses on it (is contaminated). ? Sharing utensils or other personal items with a person who is sick. What increases the risk?  Having a weak body defense system (immune system).  Living with one or more children who are younger than 49 years old.  Living in a nursing home.  Going on cruise ships. What are the signs or symptoms? Symptoms of this condition start suddenly. Symptoms may last for a few days or for as long as a week.  Common symptoms include: ? Watery poop. ? Throwing up.  Other symptoms include: ? Fever. ? Headache. ? Feeling tired (fatigue). ? Pain in the belly (abdomen). ? Chills. ? Feeling weak. ? Feeling sick to your stomach (nauseous). ? Muscle aches. ? Not feeling hungry. How is this treated?  This  condition typically goes away on its own.  The focus of treatment is to replace the fluids that you lose. This condition may be treated with: ? An ORS (oral rehydration solution). This is a drink that is sold at pharmacies and stores. ? Medicines to help with your symptoms. ? Probiotic supplements to reduce symptoms of diarrhea. ? Fluids given through an IV tube, if needed.  Older adults and people with other diseases or a weak body defense system are at higher risk for not having enough water in the body. Follow these instructions at home: Eating and drinking   Take an ORS as told by your doctor.  Drink clear fluids in small amounts as you are able. Clear fluids include: ? Water. ? Ice chips. ? Fruit juice with water added to it (diluted). ? Low-calorie sports drinks.  Drink enough fluid to keep your pee (urine) pale yellow.  Eat small amounts of healthy foods every 3-4 hours as you are able. This may include whole grains, fruits, vegetables, lean meats, and yogurt.  Avoid fluids that have a lot of sugar or caffeine in them, such as energy drinks, sports drinks, and soda.  Avoid spicy or fatty foods.  Avoid alcohol. General instructions   Wash your hands often. This is very important after you have watery poop or you throw up. If you cannot use soap and water, use hand sanitizer.  Make sure that all people  in your home wash their hands well and often.  Take over-the-counter and prescription medicines only as told by your doctor.  Rest at home while you get better.  Watch your condition for any changes.  Take a warm bath to help with any burning or pain from having watery poop.  Keep all follow-up visits as told by your doctor. This is important. Contact a doctor if:  You cannot keep fluids down.  Your symptoms get worse.  You have new symptoms.  You feel light-headed.  You feel dizzy.  You have muscle cramps. Get help right away if:  You have chest pain.   You feel very weak.  You pass out (faint).  You see blood in your throw-up.  Your throw-up looks like coffee grounds.  You have bloody or black poop (stools) or poop that looks like tar.  You have a very bad headache, or a stiff neck, or both.  You have a rash.  You have very bad pain, cramping, or bloating in your belly.  You have trouble breathing.  You are breathing very quickly.  You have a fast heartbeat.  Your skin feels cold and clammy.  You feel mixed up (confused).  You have pain when you pee.  You have signs of not having enough water in the body, such as: ? Dark pee, hardly any pee, or no pee. ? Cracked lips. ? Dry mouth. ? Sunken eyes. ? Feeling very sleepy. ? Feeling weak. Summary  Viral gastroenteritis is also known as the stomach flu.  This condition can cause sudden watery poop (diarrhea), fever, and throwing up (vomiting).  These germs can be passed from person to person very easily.  Take an ORS as told by your doctor. This is a drink that is sold at pharmacies and stores.  Drink fluids in small amounts many times each day as you are able. This information is not intended to replace advice given to you by your health care provider. Make sure you discuss any questions you have with your health care provider. Document Released: 11/21/2007 Document Revised: 04/09/2018 Document Reviewed: 04/09/2018 Elsevier Patient Education  2020 [employer].

## 2019-03-13 ENCOUNTER — Encounter: Payer: Self-pay | Admitting: Family Medicine

## 2019-03-16 NOTE — Telephone Encounter (Signed)
Good morning Lauren [name]. What will this appt be for?

## 2019-03-20 ENCOUNTER — Other Ambulatory Visit: Payer: Self-pay

## 2019-03-20 ENCOUNTER — Encounter (INDEPENDENT_AMBULATORY_CARE_PROVIDER_SITE_OTHER): Payer: Self-pay

## 2019-03-20 ENCOUNTER — Encounter: Payer: Self-pay | Admitting: Family Medicine

## 2019-03-20 ENCOUNTER — Ambulatory Visit: Payer: BC Managed Care – PPO | Admitting: Family Medicine

## 2019-03-20 ENCOUNTER — Ambulatory Visit (INDEPENDENT_AMBULATORY_CARE_PROVIDER_SITE_OTHER): Payer: BC Managed Care – PPO | Admitting: Family Medicine

## 2019-03-20 VITALS — Temp 98.7°F

## 2019-03-20 DIAGNOSIS — Z20828 Contact with and (suspected) exposure to other viral communicable diseases: Secondary | ICD-10-CM

## 2019-03-20 DIAGNOSIS — F332 Major depressive disorder, recurrent severe without psychotic features: Secondary | ICD-10-CM

## 2019-03-20 DIAGNOSIS — Z20822 Contact with and (suspected) exposure to covid-19: Secondary | ICD-10-CM

## 2019-03-20 MED ORDER — BENZONATATE 100 MG PO CAPS: 100.0000 mg | ORAL_CAPSULE | Freq: Three times a day (TID) | ORAL | 0 refills | Status: DC | PRN

## 2019-03-20 MED ORDER — DULOXETINE HCL 30 MG PO CPEP: 30.0000 mg | ORAL_CAPSULE | Freq: Two times a day (BID) | ORAL | 1 refills | Status: DC

## 2019-03-20 NOTE — Progress Notes (Signed)
Name: Lauren [name]   MRN: [id number]    DOB: 25-Oct-1975   Date:03/20/2019       Progress Note  Subjective  Chief Complaint  Chief Complaint  Patient presents with  . Medication Refill  . Possible COVID 19 exposure    Boyfriend testing positive. Patient's current symptoms are cough, headache, and runny nose. Pt would like to have test done.    I connected with  WAHNETA BAVA on 03/20/19 at  1:40 PM EDT by telephone and verified that I am speaking with the correct person using two identifiers.   I discussed the limitations, risks, security and privacy concerns of performing an evaluation and management service by telephone and the availability of in person appointments. Staff also discussed with the patient that there may be a patient responsible charge related to this service. Patient Location: Home Provider Location: Office Additional Individuals present: None  HPI  Pt presents with concern for COVID-19 exposure and symptoms.  She notes that her boyfriend tested positive 03/17/2019, and the patient's symptoms started on 03/19/2019.  Current symptoms include headache, dry cough, taste disturbance, and rhinorrhea.  No fever, GI upset, loss of smell.  She is currently quarantined from her boyfriend.   Depression/Anxiety: Describes some agoraphobia and depressive symptoms.  Denies SI/HI.  She feels ok on the cymbalta, but would like to increase to gain better efficacy - we will go to BID dosing; also taking buspar BID.  She denies any recent panic attacks, but still having a lot of anxiety.   Patient Active Problem List   Diagnosis Date Noted  . Morbid obesity ([place]) 01/13/2018  . Allergic rhinitis, seasonal 04/23/2017  . Genital herpes 04/23/2017  . Type 2 diabetes mellitus, uncontrolled ([place]) 04/23/2017  . 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 ([place]) 05/17/2016  . Bronchitis 03/16/2016  . Cholesteatoma of attic of right ear 12/05/2015  . Nocturnal cough 11/29/2015  .  GERD (gastroesophageal reflux disease) 11/29/2015  . Acid reflux 09/19/2015  . Screening for breast cancer 09/19/2015  . Chronic pelvic pain in female 09/19/2015  . Vitamin D deficiency 12/13/2014  . History of herpes genitalis 12/13/2014  . Hyperlipidemia 05/26/2014  . Smoker 05/26/2014    Social History   Tobacco Use  . Smoking status: Current Every Day Smoker    Packs/day: 1.00    Years: 28.00    Pack years: 28.00    Types: Cigarettes  . Smokeless tobacco: Never Used  Substance Use Topics  . Alcohol use: Not Currently    Alcohol/week: 0.0 standard drinks     Current Outpatient Medications:  .  albuterol (PROVENTIL HFA) 108 (90 Base) MCG/ACT inhaler, Inhale 2 puffs into the lungs every 6 (six) hours as needed for wheezing or shortness of breath., Disp: 1 Inhaler, Rfl: 3 .  busPIRone (BUSPAR) 7.5 MG tablet, TAKE 1 TABLET (7.5 MG TOTAL) BY MOUTH 2 (TWO) TIMES DAILY., Disp: 180 tablet, Rfl: 1 .  dapagliflozin propanediol (FARXIGA) 10 MG TABS tablet, Take 10 mg by mouth daily., Disp: , Rfl:  .  DULoxetine (CYMBALTA) 30 MG capsule, Take 1 capsule (30 mg total) by mouth daily., Disp: 90 capsule, Rfl: 0 .  famotidine (PEPCID) 20 MG tablet, Take 1 tablet (20 mg total) by mouth at bedtime., Disp: 90 tablet, Rfl: 0 .  fluticasone (FLONASE) 50 MCG/ACT nasal spray, SPRAY 2 SPRAYS INTO EACH NOSTRIL EVERY DAY, Disp: 48 mL, Rfl: 0 .  lisinopril (PRINIVIL,ZESTRIL) 20 MG tablet, Take 20 mg by mouth daily.,  Disp: , Rfl: 11 .  meloxicam (MOBIC) 7.5 MG tablet, TAKE 1 TABLET BY MOUTH EVERY DAY WITH FOOD, Disp: 30 tablet, Rfl: 0 .  metFORMIN (GLUCOPHAGE-XR) 500 MG 24 hr tablet, TAKE 2 TABLETS BY MOUTH EVERY DAY, Disp: 180 tablet, Rfl: 0 .  rosuvastatin (CRESTOR) 10 MG tablet, TAKE 1 TABLET (10 MG TOTAL) BY MOUTH AT BEDTIME. FOR CHOLESTEROL STOP SIMVASTATIN, Disp: 90 tablet, Rfl: 0 .  sitaGLIPtin (JANUVIA) 100 MG tablet, Take 100 mg by mouth daily., Disp: , Rfl:  .  valACYclovir (VALTREX) 1000 MG  tablet, TAKE 1 TABLET (1,000 MG TOTAL) BY MOUTH DAILY., Disp: 90 tablet, Rfl: 3 .  benzonatate (TESSALON PERLES) 100 MG capsule, Take 1 capsule (100 mg total) by mouth 3 (three) times daily as needed. (Patient not taking: Reported on 03/11/2019), Disp: 30 capsule, Rfl: 0 .  Vitamin D, Ergocalciferol, (DRISDOL) 1.25 MG (50000 UT) CAPS capsule, Take 1 capsule by mouth once a week., Disp: , Rfl:   No Known Allergies  I personally reviewed active problem list, medication list, allergies, notes from last encounter, lab results with the patient/caregiver today.  ROS  Ten systems reviewed and is negative except as mentioned in HPI  Objective  Virtual encounter, vitals not obtained.  There is no height or weight on file to calculate BMI.  Nursing Note and Vital Signs reviewed.  Physical Exam  Pulmonary/Chest: Effort normal. No respiratory distress. Speaking in complete sentences. Dry cough present intermittently. Neurological: Pt is alert and oriented to person, place, and time. Speech is normal Psychiatric: Patient has a normal mood and affect. behavior is normal. Judgment and thought content normal.  No results found for this or any previous visit (from the past 72 hour(s)).  Assessment & Plan  1. Suspected COVID-19 virus infection - Strict quarantine until results are returned. - MyChart Temperature FLOWSHEET; Future - Novel Coronavirus, NAA ([facility]) - benzonatate (TESSALON PERLES) 100 MG capsule; Take 1 capsule (100 mg total) by mouth 3 (three) times daily as needed.  Dispense: 30 capsule; Refill: 0 - MyChart COVID-19 home monitoring program; Future  2. Close exposure to COVID-19 virus - MyChart Temperature FLOWSHEET; Future - Novel Coronavirus, NAA ([facility]) - MyChart COVID-19 home monitoring program; Future  3. Severe episode of recurrent major depressive disorder, without psychotic features ([place]) - Increase to BID dosing; maintain Buspar. - DULoxetine (CYMBALTA) 30 MG  capsule; Take 1 capsule (30 mg total) by mouth 2 (two) times daily.  Dispense: 180 capsule; Refill: 1    -Red flags and when to present for emergency care or RTC including fever >101.23F, chest pain, shortness of breath, new/worsening/un-resolving symptoms, reviewed with patient at time of visit. Follow up and care instructions discussed and provided in AVS. - I discussed the assessment and treatment plan with the patient. The patient was provided an opportunity to ask questions and all were answered. The patient agreed with the plan and demonstrated an understanding of the instructions.  - The patient was advised to call back or seek an in-person evaluation if the symptoms worsen or if the condition fails to improve as anticipated.  I provided 17 minutes of non-face-to-face time during this encounter.  Hubbard Hartshorn, FNP

## 2019-03-21 ENCOUNTER — Encounter (INDEPENDENT_AMBULATORY_CARE_PROVIDER_SITE_OTHER): Payer: Self-pay

## 2019-03-21 LAB — NOVEL CORONAVIRUS, NAA: SARS-CoV-2, NAA: NOT DETECTED

## 2019-03-22 ENCOUNTER — Encounter (INDEPENDENT_AMBULATORY_CARE_PROVIDER_SITE_OTHER): Payer: Self-pay

## 2019-03-23 ENCOUNTER — Telehealth: Payer: Self-pay

## 2019-03-23 ENCOUNTER — Encounter (INDEPENDENT_AMBULATORY_CARE_PROVIDER_SITE_OTHER): Payer: Self-pay

## 2019-03-23 NOTE — Telephone Encounter (Signed)
Patient advise on cough per protocol:  If cough remains the same or better: continue to treat with over the counter medications. Hard candy or cough drops and drinking warm fluids. Adults can also use honey 2 tsp (10 ML) at bedtime.   HONEY IS NOT RECOMMENDED FOR INFANTS UNDER ONE.  If cough is becoming worse even with the use of over the counter medications and patient is not able to sleep at night, cough becomes productive with sputum that maybe yellow or green in color, contact PCP  Patient verbalized understanding and agrees with the plan.

## 2019-03-24 ENCOUNTER — Encounter (INDEPENDENT_AMBULATORY_CARE_PROVIDER_SITE_OTHER): Payer: Self-pay

## 2019-03-24 ENCOUNTER — Telehealth: Payer: Self-pay

## 2019-03-24 NOTE — Telephone Encounter (Signed)
Patient was advise on change in appetite per protocol:  If appetite becomes worse: encourage patient to drink fluids as tolerated, work their way up to bland solid food such as crackers, pretzels, soup, bread or applesauce and boiled starches.   If patient is unable to tolerate any foods or liquids, notify PCP.   IF PATIENT DEVELOPS SEVERE VOMITING (MORE THAN 6 TIMES A DAY AND OR >8 HOURS) AND/OR SEVERE ABDOMINAL PAIN ADVISE PATIENT TO CALL 911 AND SEEK TREATMENT IN ED  Patient verbalized understanding and agrees with plan.

## 2019-03-25 ENCOUNTER — Encounter (INDEPENDENT_AMBULATORY_CARE_PROVIDER_SITE_OTHER): Payer: Self-pay

## 2019-03-26 ENCOUNTER — Encounter (INDEPENDENT_AMBULATORY_CARE_PROVIDER_SITE_OTHER): Payer: Self-pay

## 2019-03-26 ENCOUNTER — Telehealth: Payer: Self-pay

## 2019-03-26 ENCOUNTER — Ambulatory Visit: Payer: BC Managed Care – PPO | Admitting: Family Medicine

## 2019-03-26 NOTE — Telephone Encounter (Signed)
Patient has been advised on cough,weakness,SOB, and appetite per protocol:  Shortness of breath is the same: continue to monitor at home   If symptoms become severe, i.e. shortness of breath at rest, gasping for air, wheezing, CALL 911 AND SEEK TREATMENT IN THE ED  If cough remains the same or better: continue to treat with over the counter medications. Hard candy or cough drops and drinking warm fluids. Adults can also use honey 2 tsp (10 ML) at bedtime.   HONEY IS NOT RECOMMENDED FOR INFANTS UNDER ONE.  If cough is becoming worse even with the use of over the counter medications and patient is not able to sleep at night, cough becomes productive with sputum that maybe yellow or green in color, contact PCP.  IF PATIENT HAS WORSENING WEAKNESS WITH INABILITY TO STAND OR IF PATIENT HAS TO HOLD ON TO SOMETHING TO GET BALANCE, ADVISE PATIENT TO CALL 911 AND SEEK TREATMENT IN ED   f appetite becomes worse: encourage patient to drink fluids as tolerated, work their way up to bland solid food such as crackers, pretzels, soup, bread or applesauce and boiled starches.   If patient is unable to tolerate any foods or liquids, notify PCP.   IF PATIENT DEVELOPS SEVERE VOMITING (MORE THAN 6 TIMES A DAY AND OR >8 HOURS) AND/OR SEVERE ABDOMINAL PAIN ADVISE PATIENT TO CALL 911 AND SEEK TREATMENT IN ED   Patient verbalized understanding and agrees with plan. She will continue to monitor her symptoms.

## 2019-03-27 ENCOUNTER — Encounter (INDEPENDENT_AMBULATORY_CARE_PROVIDER_SITE_OTHER): Payer: Self-pay

## 2019-03-29 ENCOUNTER — Encounter (INDEPENDENT_AMBULATORY_CARE_PROVIDER_SITE_OTHER): Payer: Self-pay

## 2019-03-30 ENCOUNTER — Encounter (INDEPENDENT_AMBULATORY_CARE_PROVIDER_SITE_OTHER): Payer: Self-pay

## 2019-03-31 ENCOUNTER — Encounter (INDEPENDENT_AMBULATORY_CARE_PROVIDER_SITE_OTHER): Payer: Self-pay

## 2019-04-01 ENCOUNTER — Encounter (INDEPENDENT_AMBULATORY_CARE_PROVIDER_SITE_OTHER): Payer: Self-pay

## 2019-04-02 ENCOUNTER — Encounter (INDEPENDENT_AMBULATORY_CARE_PROVIDER_SITE_OTHER): Payer: Self-pay

## 2019-04-03 ENCOUNTER — Encounter: Payer: Self-pay | Admitting: Family Medicine

## 2019-04-07 ENCOUNTER — Other Ambulatory Visit: Payer: Self-pay

## 2019-04-07 DIAGNOSIS — E782 Mixed hyperlipidemia: Secondary | ICD-10-CM

## 2019-04-07 MED ORDER — ROSUVASTATIN CALCIUM 10 MG PO TABS: 10.0000 mg | ORAL_TABLET | Freq: Every day | ORAL | 0 refills | Status: DC

## 2019-04-08 ENCOUNTER — Encounter: Payer: Self-pay | Admitting: Family Medicine

## 2019-04-09 ENCOUNTER — Encounter: Payer: Self-pay | Admitting: Family Medicine

## 2019-05-12 ENCOUNTER — Encounter: Payer: Self-pay | Admitting: Family Medicine

## 2019-05-12 ENCOUNTER — Other Ambulatory Visit: Payer: Self-pay

## 2019-05-12 ENCOUNTER — Ambulatory Visit (INDEPENDENT_AMBULATORY_CARE_PROVIDER_SITE_OTHER): Payer: BC Managed Care – PPO | Admitting: Family Medicine

## 2019-05-12 VITALS — Temp 98.3°F

## 2019-05-12 DIAGNOSIS — H6691 Otitis media, unspecified, right ear: Secondary | ICD-10-CM | POA: Diagnosis not present

## 2019-05-12 MED ORDER — OFLOXACIN 0.3 % OT SOLN: 5.0000 [drp] | Freq: Every day | OTIC | 0 refills | Status: DC

## 2019-05-12 MED ORDER — SULFAMETHOXAZOLE-TRIMETHOPRIM 800-160 MG PO TABS: 1.0000 | ORAL_TABLET | Freq: Two times a day (BID) | ORAL | 0 refills | Status: DC

## 2019-05-12 NOTE — Progress Notes (Signed)
Name: Lauren [name]   MRN: [id number]    DOB: 12/22/1975   Date:05/12/2019       Progress Note  Subjective  Chief Complaint  Chief Complaint  Patient presents with  . Ear Drainage    Onset-Saturday on her right ear but started draining Sunday, popping noises when she swallows, painful  . Ear Pain    Bilateral ears throb and make it so painful but she can not sleep    I connected with  [name] on 05/12/19 at  9:20 AM EST by telephone and verified that I am speaking with the correct person using two identifiers.   I discussed the limitations, risks, security and privacy concerns of performing an evaluation and management service by telephone and the availability of in person appointments. Staff also discussed with the patient that there may be a patient responsible charge related to this service. Patient Location: home  Provider Location: [facility]   HPI  Right ear pain : she states symptoms last week with some sinus pressure, followed by ear fullness and popping sensation. She states she has a history of right ear surgery and when she has an infection it drains, she states drainage started on Sunday from the right side. She declines, fever, chills or change in appetite. She has noticed a mild headache and cough from post-nasal drainage. She is not aware of exposure so COVID-19. She is taking Tylenol and ibuprofen otc for pain. At this time 5/10 but pain is worse during the night.   Patient Active Problem List   Diagnosis Date Noted  . Morbid obesity ([place]) 01/13/2018  . Allergic rhinitis, seasonal 04/23/2017  . Genital herpes 04/23/2017  . Type 2 diabetes mellitus, uncontrolled ([place]) 04/23/2017  . 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 ([place]) 05/17/2016  . Bronchitis 03/16/2016  . Cholesteatoma of attic of right ear 12/05/2015  . Nocturnal cough 11/29/2015  . GERD (gastroesophageal reflux disease) 11/29/2015  . Acid reflux 09/19/2015  .  Screening for breast cancer 09/19/2015  . Chronic pelvic pain in female 09/19/2015  . Vitamin D deficiency 12/13/2014  . History of herpes genitalis 12/13/2014  . Hyperlipidemia 05/26/2014  . Smoker 05/26/2014    Social History   Tobacco Use  . Smoking status: Current Every Day Smoker    Packs/day: 1.00    Years: 28.00    Pack years: 28.00    Types: Cigarettes  . Smokeless tobacco: Never Used  Substance Use Topics  . Alcohol use: Not Currently    Alcohol/week: 0.0 standard drinks     Current Outpatient Medications:  .  albuterol (PROVENTIL HFA) 108 (90 Base) MCG/ACT inhaler, Inhale 2 puffs into the lungs every 6 (six) hours as needed for wheezing or shortness of breath., Disp: 1 Inhaler, Rfl: 3 .  busPIRone (BUSPAR) 7.5 MG tablet, TAKE 1 TABLET (7.5 MG TOTAL) BY MOUTH 2 (TWO) TIMES DAILY., Disp: 180 tablet, Rfl: 1 .  dapagliflozin propanediol (FARXIGA) 10 MG TABS tablet, Take 10 mg by mouth daily., Disp: , Rfl:  .  DULoxetine (CYMBALTA) 30 MG capsule, Take 1 capsule (30 mg total) by mouth 2 (two) times daily., Disp: 180 capsule, Rfl: 1 .  famotidine (PEPCID) 20 MG tablet, Take 1 tablet (20 mg total) by mouth at bedtime., Disp: 90 tablet, Rfl: 0 .  fluticasone (FLONASE) 50 MCG/ACT nasal spray, SPRAY 2 SPRAYS INTO EACH NOSTRIL EVERY DAY, Disp: 48 mL, Rfl: 0 .  lisinopril (PRINIVIL,ZESTRIL) 20 MG tablet, Take 20 mg  by mouth daily., Disp: , Rfl: 11 .  meloxicam (MOBIC) 7.5 MG tablet, TAKE 1 TABLET BY MOUTH EVERY DAY WITH FOOD, Disp: 30 tablet, Rfl: 0 .  rosuvastatin (CRESTOR) 10 MG tablet, Take 1 tablet (10 mg total) by mouth at bedtime. For cholesterol; STOP simvastatin, Disp: 90 tablet, Rfl: 0 .  sitaGLIPtin (JANUVIA) 100 MG tablet, Take 100 mg by mouth daily., Disp: , Rfl:  .  valACYclovir (VALTREX) 1000 MG tablet, TAKE 1 TABLET (1,000 MG TOTAL) BY MOUTH DAILY., Disp: 90 tablet, Rfl: 3 .  Vitamin D, Ergocalciferol, (DRISDOL) 1.25 MG (50000 UT) CAPS capsule, Take 1 capsule by mouth  once a week., Disp: , Rfl:  .  benzonatate (TESSALON PERLES) 100 MG capsule, Take 1 capsule (100 mg total) by mouth 3 (three) times daily as needed. (Patient not taking: Reported on 05/12/2019), Disp: 30 capsule, Rfl: 0 .  metFORMIN (GLUCOPHAGE-XR) 500 MG 24 hr tablet, TAKE 2 TABLETS BY MOUTH EVERY DAY (Patient not taking: Reported on 05/12/2019), Disp: 180 tablet, Rfl: 0  No Known Allergies  I personally reviewed active problem list, medication list, allergies, family history, social history, health maintenance with the patient/caregiver today.  ROS  Ten systems reviewed and is negative except as mentioned in HPI   Objective  Virtual encounter, vitals not obtained.  There is no height or weight on file to calculate BMI.  Nursing Note and Vital Signs reviewed.  Physical Exam  Awake, alert and oriented  Assessment & Plan  1. Acute bacterial otitis media, right  - sulfamethoxazole-trimethoprim (BACTRIM DS) 800-160 MG tablet; Take 1 tablet by mouth 2 (two) times daily.  Dispense: 20 tablet; Refill: 0 - ofloxacin (FLOXIN OTIC) 0.3 % OTIC solution; Place 5 drops into the right ear daily.  Dispense: 5 mL; Refill: 0  She did not want Augmentin because it causes yeast infection.   -Red flags and when to present for emergency care or RTC including fever >101.64F, chest pain, shortness of breath, new/worsening/un-resolving symptoms,  reviewed with patient at time of visit. Follow up and care instructions discussed and provided in AVS. - I discussed the assessment and treatment plan with the patient. The patient was provided an opportunity to ask questions and all were answered. The patient agreed with the plan and demonstrated an understanding of the instructions.  - The patient was advised to call back or seek an in-person evaluation if the symptoms worsen or if the condition fails to improve as anticipated.  I provided 15  minutes of non-face-to-face time during this encounter.  Loistine Chance, MD

## 2019-05-19 DIAGNOSIS — E669 Obesity, unspecified: Secondary | ICD-10-CM | POA: Diagnosis not present

## 2019-05-19 DIAGNOSIS — E559 Vitamin D deficiency, unspecified: Secondary | ICD-10-CM | POA: Diagnosis not present

## 2019-05-19 DIAGNOSIS — E782 Mixed hyperlipidemia: Secondary | ICD-10-CM | POA: Diagnosis not present

## 2019-05-19 DIAGNOSIS — E119 Type 2 diabetes mellitus without complications: Secondary | ICD-10-CM | POA: Diagnosis not present

## 2019-05-29 ENCOUNTER — Other Ambulatory Visit: Payer: Self-pay

## 2019-05-29 ENCOUNTER — Encounter: Payer: Self-pay | Admitting: Family Medicine

## 2019-05-29 ENCOUNTER — Ambulatory Visit (INDEPENDENT_AMBULATORY_CARE_PROVIDER_SITE_OTHER): Payer: BC Managed Care – PPO | Admitting: Family Medicine

## 2019-05-29 VITALS — Ht 62.0 in | Wt 202.0 lb

## 2019-05-29 DIAGNOSIS — E1165 Type 2 diabetes mellitus with hyperglycemia: Secondary | ICD-10-CM

## 2019-05-29 DIAGNOSIS — R05 Cough: Secondary | ICD-10-CM | POA: Diagnosis not present

## 2019-05-29 DIAGNOSIS — F329 Major depressive disorder, single episode, unspecified: Secondary | ICD-10-CM

## 2019-05-29 DIAGNOSIS — J40 Bronchitis, not specified as acute or chronic: Secondary | ICD-10-CM | POA: Diagnosis not present

## 2019-05-29 DIAGNOSIS — K529 Noninfective gastroenteritis and colitis, unspecified: Secondary | ICD-10-CM

## 2019-05-29 DIAGNOSIS — E782 Mixed hyperlipidemia: Secondary | ICD-10-CM

## 2019-05-29 DIAGNOSIS — J449 Chronic obstructive pulmonary disease, unspecified: Secondary | ICD-10-CM | POA: Diagnosis not present

## 2019-05-29 DIAGNOSIS — R058 Other specified cough: Secondary | ICD-10-CM

## 2019-05-29 DIAGNOSIS — F419 Anxiety disorder, unspecified: Secondary | ICD-10-CM | POA: Diagnosis not present

## 2019-05-29 DIAGNOSIS — Z8619 Personal history of other infectious and parasitic diseases: Secondary | ICD-10-CM

## 2019-05-29 DIAGNOSIS — F32A Depression, unspecified: Secondary | ICD-10-CM

## 2019-05-29 DIAGNOSIS — I10 Essential (primary) hypertension: Secondary | ICD-10-CM

## 2019-05-29 MED ORDER — LISINOPRIL 20 MG PO TABS: 20.0000 mg | ORAL_TABLET | Freq: Every day | ORAL | 3 refills | Status: DC

## 2019-05-29 MED ORDER — ALBUTEROL SULFATE HFA 108 (90 BASE) MCG/ACT IN AERS: 2.0000 | INHALATION_SPRAY | Freq: Four times a day (QID) | RESPIRATORY_TRACT | 2 refills | Status: DC | PRN

## 2019-05-29 MED ORDER — ROSUVASTATIN CALCIUM 20 MG PO TABS: 20.0000 mg | ORAL_TABLET | Freq: Every day | ORAL | 3 refills | Status: DC

## 2019-05-29 MED ORDER — BUSPIRONE HCL 7.5 MG PO TABS: 7.5000 mg | ORAL_TABLET | Freq: Three times a day (TID) | ORAL | 1 refills | Status: DC

## 2019-05-29 MED ORDER — VALACYCLOVIR HCL 1 G PO TABS: 1000.0000 mg | ORAL_TABLET | Freq: Every day | ORAL | 3 refills | Status: DC

## 2019-05-29 NOTE — Progress Notes (Signed)
Name: Lauren [name]   MRN: [id number]    DOB: 02-21-1976   Date:05/29/2019       Progress Note  Subjective:    Chief Complaint  Chief Complaint  Patient presents with  . Follow-up  . Hyperlipidemia  . Hypertension  . Depression    I connected with  ARIJ LYDY  on 05/29/19 at 10:20 AM EST by a video enabled telemedicine application and verified that I am speaking with the correct person using two identifiers.  I discussed the limitations of evaluation and management by telemedicine and the availability of in person appointments. The patient expressed understanding and agreed to proceed. Staff also discussed with the patient that there may be a patient responsible charge related to this service. Patient Location: home Provider Location: [facility] Additional Individuals present: none  HPI   Anxiety/Depression Recently increased dose of cymbalta to 30 mg BID and buspar 7.5 mg BID Depression screen [facility] 2/9 05/29/2019 05/12/2019 03/20/2019  Decreased Interest 0 0 1  Down, Depressed, Hopeless 0 0 1  PHQ - 2 Score 0 0 2  Altered sleeping 0 0 0  Tired, decreased energy 0 0 1  Change in appetite 0 0 1  Feeling bad or failure about yourself  0 0 0  Trouble concentrating 0 0 0  Moving slowly or fidgety/restless 0 0 0  Suicidal thoughts 0 0 0  PHQ-9 Score 0 0 4  Difficult doing work/chores Not difficult at all Not difficult at all Somewhat difficult  Some recent data might be hidden   GAD 7 : Generalized Anxiety Score 05/29/2019 03/20/2019 11/07/2018 09/12/2018  Nervous, Anxious, on Edge 2 2 1 1   Control/stop worrying 1 1 2 2   Worry too much - different things 1 0 3 3  Trouble relaxing 1 0 3 3  Restless 1 0 3 3  Easily annoyed or irritable 2 1 3 3   Afraid - awful might happen 1 1 3 3   Total GAD 7 Score 9 5 18 18   Anxiety Difficulty Not difficult at all Somewhat difficult Very difficult Very difficult  She feels depression is better but she is still very anxious about  COVID  She requests Med refill on inhalers and valtrex - using inhaler every day - says she gets bronchitis a lot - denies DOE, productive cough, CP - unclear hx, asked to come in to discuss further, no hx of maintenance inhalers  Hypertension:  Currently managed on lisinopril 20 mg  Pt reports good med compliance and denies any SE.  No lightheadedness, hypotension, syncope. Blood pressure today is unable to be measured - recent visit with endo 05/19/2019 be was 130/78 well controlled. BP Readings from Last 3 Encounters:  01/13/19 133/86  08/27/18 130/70  03/17/18 132/76   Pt denies CP, SOB, exertional sx, LE edema, palpitation, Ha's, visual disturbances Dietary efforts for BP? Trying to have healthier diet   Hyperlipidemia:  Current Medication Regimen:  crestor 10 mg, poor compliance - misses doses Last Lipids: Lab Results  Component Value Date   CHOL 143 01/13/2018   HDL 30 (L) 01/13/2018   LDLCALC 87 01/13/2018   TRIG 155 (H) 01/13/2018   CHOLHDL 4.8 01/13/2018   Total cholesterol 05/19/2019 268, LDL 166, HDL 29.1, Trig 366 - per care everywhere  - Current Diet:  Trying to be healthier - Denies: Chest pain, shortness of breath, myalgias. - Documented aortic atherosclerosis? No - Risk factors for atherosclerosis: diabetes mellitus, hypercholesterolemia and hypertension  Patient Active Problem List   Diagnosis Date Noted  . Morbid obesity ([place]) 01/13/2018  . Allergic rhinitis, seasonal 04/23/2017  . Genital herpes 04/23/2017  . Type 2 diabetes mellitus, uncontrolled ([place]) 04/23/2017  . 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 ([place]) 05/17/2016  . Bronchitis 03/16/2016  . Cholesteatoma of attic of right ear 12/05/2015  . Nocturnal cough 11/29/2015  . GERD (gastroesophageal reflux disease) 11/29/2015  . Acid reflux 09/19/2015  . Screening for breast cancer 09/19/2015  . Chronic pelvic pain in female 09/19/2015  . Vitamin D deficiency 12/13/2014  . History of  herpes genitalis 12/13/2014  . Hyperlipidemia 05/26/2014  . Smoker 05/26/2014    Past Surgical History:  Procedure Laterality Date  . APPENDECTOMY    . CESAREAN SECTION     x 4   . COLONOSCOPY    . HERNIA REPAIR  09/25/2011  . TUBAL LIGATION    . TYMPANOMASTOIDECTOMY Right 12/07/2015   Procedure: TYMPANOMASTOIDECTOMY;  Surgeon: Clyde Canterbury, MD;  Location: [facility] ORS;  Service: ENT;  Laterality: Right;    Family History  Problem Relation Age of Onset  . Hypertension Mother   . Hyperlipidemia Mother   . Diabetes Mother   . Arrhythmia Father 20       A-fib  . Alcohol abuse Father   . Heart disease Brother 43       stent x 1   . Alcoholism Brother   . Asthma Son   . Asperger's syndrome Son     Social History   Socioeconomic History  . Marital status: Single    Spouse name: Not on file  . Number of children: 4  . Years of education: Not on file  . Highest education level: GED or equivalent  Occupational History    Employer: [employer]  Tobacco Use  . Smoking status: Current Every Day Smoker    Packs/day: 1.00    Years: 28.00    Pack years: 28.00    Types: Cigarettes  . Smokeless tobacco: Never Used  Substance and Sexual Activity  . Alcohol use: Not Currently    Alcohol/week: 0.0 standard drinks  . Drug use: No  . Sexual activity: Yes    Partners: Male  Other Topics Concern  . Not on file  Social History Narrative  . Not on file   Social Determinants of Health   Financial Resource Strain: Low Risk   . Difficulty of Paying Living Expenses: Not hard at all  Food Insecurity: No Food Insecurity  . Worried About Charity fundraiser in the Last Year: Never true  . Ran Out of Food in the Last Year: Never true  Transportation Needs: No Transportation Needs  . Lack of Transportation (Medical): No  . Lack of Transportation (Non-Medical): No  Physical Activity: Inactive  . Days of Exercise per Week: 0 days  . Minutes of Exercise per Session: 0 min  Stress: No Stress  Concern Present  . Feeling of Stress : Not at all  Social Connections: Moderately Isolated  . Frequency of Communication with Friends and Family: More than three times a week  . Frequency of Social Gatherings with Friends and Family: Twice a week  . Attends Religious Services: Never  . Active Member of Clubs or Organizations: No  . Attends Archivist Meetings: Never  . Marital Status: Never married  Intimate Partner Violence: Not At Risk  . Fear of Current or Ex-Partner: No  . Emotionally Abused: No  . Physically Abused: No  .  Sexually Abused: No     Current Outpatient Medications:  .  albuterol (PROVENTIL HFA) 108 (90 Base) MCG/ACT inhaler, Inhale 2 puffs into the lungs every 6 (six) hours as needed for wheezing or shortness of breath., Disp: 1 Inhaler, Rfl: 3 .  busPIRone (BUSPAR) 7.5 MG tablet, TAKE 1 TABLET (7.5 MG TOTAL) BY MOUTH 2 (TWO) TIMES DAILY., Disp: 180 tablet, Rfl: 1 .  dapagliflozin propanediol (FARXIGA) 10 MG TABS tablet, Take 10 mg by mouth daily., Disp: , Rfl:  .  DULoxetine (CYMBALTA) 30 MG capsule, Take 1 capsule (30 mg total) by mouth 2 (two) times daily., Disp: 180 capsule, Rfl: 1 .  famotidine (PEPCID) 20 MG tablet, Take 1 tablet (20 mg total) by mouth at bedtime., Disp: 90 tablet, Rfl: 0 .  fluticasone (FLONASE) 50 MCG/ACT nasal spray, SPRAY 2 SPRAYS INTO EACH NOSTRIL EVERY DAY, Disp: 48 mL, Rfl: 0 .  lisinopril (PRINIVIL,ZESTRIL) 20 MG tablet, Take 20 mg by mouth daily., Disp: , Rfl: 11 .  meloxicam (MOBIC) 7.5 MG tablet, TAKE 1 TABLET BY MOUTH EVERY DAY WITH FOOD, Disp: 30 tablet, Rfl: 0 .  metFORMIN (GLUCOPHAGE-XR) 500 MG 24 hr tablet, TAKE 2 TABLETS BY MOUTH EVERY DAY, Disp: 180 tablet, Rfl: 0 .  rosuvastatin (CRESTOR) 10 MG tablet, Take 1 tablet (10 mg total) by mouth at bedtime. For cholesterol; STOP simvastatin, Disp: 90 tablet, Rfl: 0 .  sitaGLIPtin (JANUVIA) 100 MG tablet, Take 100 mg by mouth daily., Disp: , Rfl:  .  valACYclovir (VALTREX) 1000  MG tablet, TAKE 1 TABLET (1,000 MG TOTAL) BY MOUTH DAILY., Disp: 90 tablet, Rfl: 3  No Known Allergies  I personally reviewed active problem list, medication list, allergies, family history, social history, health maintenance, notes from last encounter, lab results, imaging with the patient/caregiver today.  Review of Systems  Constitutional: Negative.   HENT: Negative.   Eyes: Negative.   Respiratory: Negative.   Cardiovascular: Negative.   Gastrointestinal: Negative.   Endocrine: Negative.   Genitourinary: Negative.   Musculoskeletal: Negative.   Skin: Negative.   Allergic/Immunologic: Negative.   Neurological: Negative.   Hematological: Negative.   Psychiatric/Behavioral: Negative.   All other systems reviewed and are negative.     Objective:    Virtual encounter, vitals limited, only able to obtain the following Today's Vitals   05/29/19 1016  Weight: 202 lb (91.6 kg)  Height: 5\' 2"  (1.575 m)   Body mass index is 36.95 kg/m. Nursing Note and Vital Signs reviewed.  Physical Exam Vitals and nursing note reviewed.  Constitutional:      General: She is not in acute distress.    Appearance: She is well-developed. She is obese. She is not ill-appearing, toxic-appearing or diaphoretic.  HENT:     Head: Normocephalic and atraumatic.     Nose: Nose normal.  Eyes:     General:        Right eye: No discharge.        Left eye: No discharge.     Conjunctiva/sclera: Conjunctivae normal.  Neck:     Trachea: No tracheal deviation.  Pulmonary:     Effort: Pulmonary effort is normal. No respiratory distress.     Breath sounds: No stridor.  Musculoskeletal:        General: Normal range of motion.  Skin:    Findings: No rash.  Neurological:     Mental Status: She is alert.     Motor: No abnormal muscle tone.     Coordination: Coordination normal.  Psychiatric:        Behavior: Behavior normal.     PE limited by telephone encounter  No results found for this or  any previous visit (from the past 72 hour(s)).  PHQ2/9: Depression screen [facility] 2/9 05/29/2019 05/12/2019 03/20/2019 03/11/2019 12/02/2018  Decreased Interest 0 0 1 0 0  Down, Depressed, Hopeless 0 0 1 1 0  PHQ - 2 Score 0 0 2 1 0  Altered sleeping 0 0 0 0 0  Tired, decreased energy 0 0 1 0 1  Change in appetite 0 0 1 0 1  Feeling bad or failure about yourself  0 0 0 0 0  Trouble concentrating 0 0 0 0 0  Moving slowly or fidgety/restless 0 0 0 0 0  Suicidal thoughts 0 0 0 0 0  PHQ-9 Score 0 0 4 1 2   Difficult doing work/chores Not difficult at all Not difficult at all Somewhat difficult Somewhat difficult Not difficult at all  Some recent data might be hidden   PHQ-2/9 Result is negative.    Fall Risk: Fall Risk  05/29/2019 05/12/2019 03/20/2019 03/11/2019 12/02/2018  Falls in the past year? 1 0 0 0 0  Number falls in past yr: 0 0 0 0 0  Injury with Fall? 0 0 0 0 0     Assessment and Plan:   1. Anxiety and depression Still having anxiety, but depression is better with med changes - COVID is stressing her out, increase buspar, continue cymbalta 30 mg BID - busPIRone (BUSPAR) 7.5 MG tablet; Take 1 tablet (7.5 mg total) by mouth 3 (three) times daily.  Dispense: 270 tablet; Refill: 1  2. History of herpes genitalis Refill on valtrex - valACYclovir (VALTREX) 1000 MG tablet; Take 1 tablet (1,000 mg total) by mouth daily.  Dispense: 90 tablet; Refill: 3  3. Nocturnal cough Using inhaler every day - unclear dx and hx - refill inhaler, asked pt to come in to do OV dedicated to this - albuterol (PROVENTIL HFA) 108 (90 Base) MCG/ACT inhaler; Inhale 2 puffs into the lungs every 6 (six) hours as needed for wheezing or shortness of breath.  Dispense: 18 g; Refill: 2  4. Uncontrolled type 2 diabetes mellitus with hyperglycemia ([place]) Per endocrinology, meds recently changed, poorly controlled with worsening A1C, next appt in few months  5. Gastroenteritis better  6. Mixed  hyperlipidemia Uncontrolled, labs from endo show worse LDL, increase crestor - rosuvastatin (CRESTOR) 20 MG tablet; Take 1 tablet (20 mg total) by mouth daily.  Dispense: 90 tablet; Refill: 3  7. Morbid obesity ([place]) same  8. Hypertension, unspecified type Well controlled on lisinopril 20 mg, reviewed renal function through care everywhere - lisinopril (ZESTRIL) 20 MG tablet; Take 1 tablet (20 mg total) by mouth daily.  Dispense: 90 tablet; Refill: 3  9. Bronchitis Need f/up visit - albuterol (PROVENTIL HFA) 108 (90 Base) MCG/ACT inhaler; Inhale 2 puffs into the lungs every 6 (six) hours as needed for wheezing or shortness of breath.  Dispense: 18 g; Refill: 2  10. Chronic obstructive pulmonary disease, unspecified COPD type ([place]) Needs f/up visit and work up - albuterol (PROVENTIL HFA) 108 (90 Base) MCG/ACT inhaler; Inhale 2 puffs into the lungs every 6 (six) hours as needed for wheezing or shortness of breath.  Dispense: 18 g; Refill: 2    I discussed the assessment and treatment plan with the patient. The patient was provided an opportunity to ask questions and all were answered. The patient agreed with  the plan and demonstrated an understanding of the instructions.  The patient was advised to call back or seek an in-person evaluation if the symptoms worsen or if the condition fails to improve as anticipated.  I provided 22 minutes of non-face-to-face time during this encounter.  [name], PA-C 05/28/2009:47 AM

## 2019-06-22 ENCOUNTER — Other Ambulatory Visit: Payer: Self-pay | Admitting: Emergency Medicine

## 2019-06-22 DIAGNOSIS — K219 Gastro-esophageal reflux disease without esophagitis: Secondary | ICD-10-CM

## 2019-06-23 MED ORDER — FAMOTIDINE 20 MG PO TABS: 20.0000 mg | ORAL_TABLET | Freq: Every day | ORAL | 0 refills | Status: DC

## 2019-06-26 ENCOUNTER — Other Ambulatory Visit: Payer: Self-pay

## 2019-06-26 ENCOUNTER — Encounter: Payer: Self-pay | Admitting: Family Medicine

## 2019-06-26 ENCOUNTER — Ambulatory Visit (INDEPENDENT_AMBULATORY_CARE_PROVIDER_SITE_OTHER): Payer: Medicaid Other | Admitting: Family Medicine

## 2019-06-26 VITALS — Ht 62.0 in | Wt 206.0 lb

## 2019-06-26 DIAGNOSIS — Z5329 Procedure and treatment not carried out because of patient's decision for other reasons: Secondary | ICD-10-CM

## 2019-06-30 ENCOUNTER — Ambulatory Visit: Payer: Medicaid Other | Admitting: Family Medicine

## 2019-11-27 ENCOUNTER — Telehealth: Payer: Self-pay

## 2019-11-27 ENCOUNTER — Other Ambulatory Visit: Payer: Self-pay

## 2019-11-27 ENCOUNTER — Ambulatory Visit (INDEPENDENT_AMBULATORY_CARE_PROVIDER_SITE_OTHER): Payer: Medicaid Other | Admitting: Family Medicine

## 2019-11-27 DIAGNOSIS — I10 Essential (primary) hypertension: Secondary | ICD-10-CM | POA: Diagnosis not present

## 2019-11-27 DIAGNOSIS — Z5181 Encounter for therapeutic drug level monitoring: Secondary | ICD-10-CM

## 2019-11-27 DIAGNOSIS — E782 Mixed hyperlipidemia: Secondary | ICD-10-CM | POA: Diagnosis not present

## 2019-11-27 DIAGNOSIS — E1165 Type 2 diabetes mellitus with hyperglycemia: Secondary | ICD-10-CM | POA: Diagnosis not present

## 2019-11-27 DIAGNOSIS — Z794 Long term (current) use of insulin: Secondary | ICD-10-CM

## 2019-11-28 LAB — COMPLETE METABOLIC PANEL WITH GFR
AG Ratio: 1.4 (calc) (ref 1.0–2.5)
ALT: 93 U/L — ABNORMAL HIGH (ref 6–29)
AST: 66 U/L — ABNORMAL HIGH (ref 10–30)
Albumin: 4.2 g/dL (ref 3.6–5.1)
Alkaline phosphatase (APISO): 99 U/L (ref 31–125)
BUN/Creatinine Ratio: 8 (calc) (ref 6–22)
BUN: 6 mg/dL — ABNORMAL LOW (ref 7–25)
CO2: 25 mmol/L (ref 20–32)
Calcium: 9.5 mg/dL (ref 8.6–10.2)
Chloride: 100 mmol/L (ref 98–110)
Creat: 0.8 mg/dL (ref 0.50–1.10)
GFR, Est African American: 104 mL/min/{1.73_m2} (ref >=60)
GFR, Est Non African American: 90 mL/min/{1.73_m2} (ref >=60)
Globulin: 3 g/dL (calc) (ref 1.9–3.7)
Glucose, Bld: 262 mg/dL — ABNORMAL HIGH (ref 65–99)
Potassium: 3.9 mmol/L (ref 3.5–5.3)
Sodium: 137 mmol/L (ref 135–146)
Total Bilirubin: 0.4 mg/dL (ref 0.2–1.2)
Total Protein: 7.2 g/dL (ref 6.1–8.1)

## 2019-11-28 LAB — LIPID PANEL
Cholesterol: 212 mg/dL — ABNORMAL HIGH (ref <200)
HDL: 29 mg/dL — ABNORMAL LOW (ref >=50)
LDL Cholesterol (Calc): 138 mg/dL (calc) — ABNORMAL HIGH
Non-HDL Cholesterol (Calc): 183 mg/dL (calc) — ABNORMAL HIGH (ref <130)
Total CHOL/HDL Ratio: 7.3 (calc) — ABNORMAL HIGH (ref <5.0)
Triglycerides: 315 mg/dL — ABNORMAL HIGH (ref <150)

## 2019-11-28 LAB — CBC WITH DIFFERENTIAL/PLATELET
Absolute Monocytes: 288 cells/uL (ref 200–950)
Basophils Absolute: 51 cells/uL (ref 0–200)
Basophils Relative: 0.8 %
Eosinophils Absolute: 109 cells/uL (ref 15–500)
Eosinophils Relative: 1.7 %
HCT: 48.8 % — ABNORMAL HIGH (ref 35.0–45.0)
Hemoglobin: 15.4 g/dL (ref 11.7–15.5)
Lymphs Abs: 2534 cells/uL (ref 850–3900)
MCH: 28.2 pg (ref 27.0–33.0)
MCHC: 31.6 g/dL — ABNORMAL LOW (ref 32.0–36.0)
MCV: 89.2 fL (ref 80.0–100.0)
MPV: 11.3 fL (ref 7.5–12.5)
Monocytes Relative: 4.5 %
Neutro Abs: 3418 cells/uL (ref 1500–7800)
Neutrophils Relative %: 53.4 %
Platelets: 170 10*3/uL (ref 140–400)
RBC: 5.47 10*6/uL — ABNORMAL HIGH (ref 3.80–5.10)
RDW: 13.5 % (ref 11.0–15.0)
Total Lymphocyte: 39.6 %
WBC: 6.4 10*3/uL (ref 3.8–10.8)

## 2019-12-08 ENCOUNTER — Encounter: Payer: Self-pay | Admitting: Family Medicine

## 2019-12-08 ENCOUNTER — Other Ambulatory Visit: Payer: Self-pay

## 2019-12-08 ENCOUNTER — Ambulatory Visit: Payer: Medicaid Other | Admitting: Family Medicine

## 2019-12-08 VITALS — BP 124/82 | HR 100 | Temp 97.2°F | Resp 14 | Ht 62.0 in | Wt 197.6 lb

## 2019-12-08 DIAGNOSIS — J449 Chronic obstructive pulmonary disease, unspecified: Secondary | ICD-10-CM

## 2019-12-08 DIAGNOSIS — F332 Major depressive disorder, recurrent severe without psychotic features: Secondary | ICD-10-CM

## 2019-12-08 DIAGNOSIS — F32A Depression, unspecified: Secondary | ICD-10-CM

## 2019-12-08 DIAGNOSIS — F419 Anxiety disorder, unspecified: Secondary | ICD-10-CM

## 2019-12-08 DIAGNOSIS — E782 Mixed hyperlipidemia: Secondary | ICD-10-CM | POA: Diagnosis not present

## 2019-12-08 DIAGNOSIS — I10 Essential (primary) hypertension: Secondary | ICD-10-CM | POA: Diagnosis not present

## 2019-12-08 DIAGNOSIS — F329 Major depressive disorder, single episode, unspecified: Secondary | ICD-10-CM | POA: Diagnosis not present

## 2019-12-08 DIAGNOSIS — Z794 Long term (current) use of insulin: Secondary | ICD-10-CM | POA: Diagnosis not present

## 2019-12-08 DIAGNOSIS — E1165 Type 2 diabetes mellitus with hyperglycemia: Secondary | ICD-10-CM

## 2019-12-08 MED ORDER — DULOXETINE HCL 30 MG PO CPEP: 30.0000 mg | ORAL_CAPSULE | Freq: Two times a day (BID) | ORAL | 3 refills | Status: DC

## 2019-12-08 MED ORDER — METFORMIN HCL ER 500 MG PO TB24: 1000.0000 mg | ORAL_TABLET | Freq: Every day | ORAL | 3 refills | Status: DC

## 2019-12-08 MED ORDER — ROSUVASTATIN CALCIUM 20 MG PO TABS: 20.0000 mg | ORAL_TABLET | Freq: Every day | ORAL | 3 refills | Status: DC

## 2019-12-08 MED ORDER — ALBUTEROL SULFATE HFA 108 (90 BASE) MCG/ACT IN AERS: 2.0000 | INHALATION_SPRAY | Freq: Four times a day (QID) | RESPIRATORY_TRACT | 2 refills | Status: DC | PRN

## 2019-12-08 MED ORDER — SITAGLIPTIN PHOSPHATE 100 MG PO TABS: 100.0000 mg | ORAL_TABLET | Freq: Every day | ORAL | 3 refills | Status: DC

## 2019-12-08 MED ORDER — BUSPIRONE HCL 7.5 MG PO TABS: 7.5000 mg | ORAL_TABLET | Freq: Three times a day (TID) | ORAL | 1 refills | Status: DC

## 2019-12-08 MED ORDER — LISINOPRIL 20 MG PO TABS: 20.0000 mg | ORAL_TABLET | Freq: Every day | ORAL | 3 refills | Status: DC

## 2019-12-08 MED ORDER — DAPAGLIFLOZIN PROPANEDIOL 10 MG PO TABS: 10.0000 mg | ORAL_TABLET | Freq: Every day | ORAL | 11 refills | Status: DC

## 2019-12-08 NOTE — Patient Instructions (Signed)
Get back on all your meds  Check your blood sugar a few times a week - or check more often if you feel bad.  Please let me know you have any low blood pressure or passing out problems - let me know right away  Bring your meter with you to next appointment

## 2019-12-08 NOTE — Progress Notes (Signed)
Name: Lauren [name]   MRN: [id number]    DOB: May 29, 1976   Date:12/08/2019       Progress Note  Chief Complaint  Patient presents with  . Follow-up    6 months  . Hypertension  . Depression  . Diabetes    has not been seeing specialist and is out of all meds  . Medication Refill     Subjective:   Lauren [name] is a 44 y.o. female, presents to clinic for   DM:  Out of all meds, not able to see endo due to some transporation issues Out of meds for ~3 months  Currently managing with metformin 1000 mg BID - mostly GI SE are okay Januvia 100 mg daily  Farxiga 10 mg  Blood sugars 160 lowest to 200's Denies: Polyuria, polydipsia, vision changes, neuropathy, hypoglycemia Recent pertinent labs: Lab Results  Component Value Date   HGBA1C 10.1 (A) 12/27/2017   HGBA1C 7.3 (H) 07/11/2017   HGBA1C >14.0 07/11/2017   Standard of care and health maintenance: Urine Microalbumin:  due Foot exam:  due DM eye exam:  UTD ACEI/ARB:  Lisinopril 20 mg  Statin: crestor  Hyperlipidemia:   Out of meds x 3 months Currently treated with crestor Last Lipids: Lab Results  Component Value Date   CHOL 212 (H) 11/27/2019   HDL 29 (L) 11/27/2019   LDLCALC 138 (H) 11/27/2019   TRIG 315 (H) 11/27/2019   CHOLHDL 7.3 (H) 11/27/2019   - Denies: Chest pain, shortness of breath, myalgias, claudication  Hypertension:  Currently managed on lisinopril 20 mg Pt reports good med compliance and denies any SE.  No lightheadedness, hypotension, syncope. Blood pressure today is well controlled. BP Readings from Last 3 Encounters:  12/08/19 124/82  01/13/19 133/86  08/27/18 130/70   Pt denies CP, SOB, exertional sx, LE edema, palpitation, Ha's, visual disturbances  Anxiety and depression:  On cymbalta and buspar Depression screen [facility] 2/9 12/08/2019 06/26/2019 05/29/2019 05/12/2019 03/20/2019  Decreased Interest 1 0 0 0 1  Down, Depressed, Hopeless 1 0 0 0 1  PHQ - 2 Score 2 0 0 0 2  Altered  sleeping 2 0 0 0 0  Tired, decreased energy 2 0 0 0 1  Change in appetite 1 0 0 0 1  Feeling bad or failure about yourself  2 0 0 0 0  Trouble concentrating 0 0 0 0 0  Moving slowly or fidgety/restless 1 0 0 0 0  Suicidal thoughts 1 0 0 0 0  PHQ-9 Score 11 0 0 0 4  Difficult doing work/chores Somewhat difficult Not difficult at all Not difficult at all Not difficult at all Somewhat difficult  Some recent data might be hidden   GAD 7 : Generalized Anxiety Score 12/08/2019 05/29/2019 03/20/2019 11/07/2018  Nervous, Anxious, on Edge 1 2 2 1   Control/stop worrying 1 1 1 2   Worry too much - different things 1 1 0 3  Trouble relaxing 1 1 0 3  Restless 1 1 0 3  Easily annoyed or irritable 2 2 1 3   Afraid - awful might happen 2 1 1 3   Total GAD 7 Score 9 9 5 18   Anxiety Difficulty Somewhat difficult Not difficult at all Somewhat difficult Very difficult       Current Outpatient Medications:  .  albuterol (PROVENTIL HFA) 108 (90 Base) MCG/ACT inhaler, Inhale 2 puffs into the lungs every 6 (six) hours as needed for wheezing or shortness of breath., Disp:  18 g, Rfl: 2 .  busPIRone (BUSPAR) 7.5 MG tablet, Take 1 tablet (7.5 mg total) by mouth 3 (three) times daily., Disp: 270 tablet, Rfl: 1 .  dapagliflozin propanediol (FARXIGA) 10 MG TABS tablet, Take 10 mg by mouth daily., Disp: , Rfl:  .  DULoxetine (CYMBALTA) 30 MG capsule, Take 1 capsule (30 mg total) by mouth 2 (two) times daily., Disp: 180 capsule, Rfl: 1 .  famotidine (PEPCID) 20 MG tablet, Take 1 tablet (20 mg total) by mouth at bedtime., Disp: 90 tablet, Rfl: 0 .  fluticasone (FLONASE) 50 MCG/ACT nasal spray, SPRAY 2 SPRAYS INTO EACH NOSTRIL EVERY DAY, Disp: 48 mL, Rfl: 0 .  lisinopril (ZESTRIL) 20 MG tablet, Take 1 tablet (20 mg total) by mouth daily., Disp: 90 tablet, Rfl: 3 .  metFORMIN (GLUCOPHAGE-XR) 500 MG 24 hr tablet, TAKE 2 TABLETS BY MOUTH EVERY DAY, Disp: 180 tablet, Rfl: 0 .  rosuvastatin (CRESTOR) 20 MG tablet, Take 1  tablet (20 mg total) by mouth daily., Disp: 90 tablet, Rfl: 3 .  sitaGLIPtin (JANUVIA) 100 MG tablet, Take 100 mg by mouth daily., Disp: , Rfl:   Patient Active Problem List   Diagnosis Date Noted  . Morbid obesity ([place]) 01/13/2018  . Allergic rhinitis, seasonal 04/23/2017  . Genital herpes 04/23/2017  . Type 2 diabetes mellitus, uncontrolled ([place]) 04/23/2017  . 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 ([place]) 05/17/2016  . Bronchitis 03/16/2016  . Cholesteatoma of attic of right ear 12/05/2015  . Nocturnal cough 11/29/2015  . GERD (gastroesophageal reflux disease) 11/29/2015  . Acid reflux 09/19/2015  . Screening for breast cancer 09/19/2015  . Chronic pelvic pain in female 09/19/2015  . Vitamin D deficiency 12/13/2014  . History of herpes genitalis 12/13/2014  . Hyperlipidemia 05/26/2014  . Smoker 05/26/2014    Past Surgical History:  Procedure Laterality Date  . APPENDECTOMY    . CESAREAN SECTION     x 4   . COLONOSCOPY    . HERNIA REPAIR  09/25/2011  . TUBAL LIGATION    . TYMPANOMASTOIDECTOMY Right 12/07/2015   Procedure: TYMPANOMASTOIDECTOMY;  Surgeon: Clyde Canterbury, MD;  Location: [facility] ORS;  Service: ENT;  Laterality: Right;    Family History  Problem Relation Age of Onset  . Hypertension Mother   . Hyperlipidemia Mother   . Diabetes Mother   . Arrhythmia Father 33       A-fib  . Alcohol abuse Father   . Heart disease Brother 43       stent x 1   . Alcoholism Brother   . Asthma Son   . Asperger's syndrome Son     Social History   Tobacco Use  . Smoking status: Current Every Day Smoker    Packs/day: 1.00    Years: 28.00    Pack years: 28.00    Types: Cigarettes  . Smokeless tobacco: Never Used  Vaping Use  . Vaping Use: Never used  Substance Use Topics  . Alcohol use: Not Currently    Alcohol/week: 0.0 standard drinks  . Drug use: No     No Known Allergies  Health Maintenance  Topic Date Due  . PAP SMEAR-Modifier  09/19/2018  .  HEMOGLOBIN A1C  05/17/2019  . FOOT EXAM  11/14/2019  . COVID-19 Vaccine (1) 12/24/2019 (Originally 06/07/1987)  . HIV Screening  05/11/2020 (Originally 06/06/1990)  . Hepatitis C Screening  12/07/2020 (Originally 1976-03-08)  . INFLUENZA VACCINE  01/17/2020  . OPHTHALMOLOGY EXAM  11/30/2020  . TETANUS/TDAP  07/01/2021  .  PNEUMOCOCCAL POLYSACCHARIDE VACCINE AGE 68-64 HIGH RISK  Completed    Chart Review Today: I personally reviewed active problem list, medication list, allergies, family history, social history, health maintenance, notes from last encounter, lab results, imaging with the patient/caregiver today.   Review of Systems  10 Systems reviewed and are negative for acute change except as noted in the HPI.   Objective:   Vitals:   12/08/19 1418  BP: 124/82  Pulse: 100  Resp: 14  Temp: (!) 97.2 F (36.2 C)  SpO2: 95%  Weight: 197 lb 9.6 oz (89.6 kg)  Height: 5\' 2"  (1.575 m)    Body mass index is 36.14 kg/m.  Physical Exam Vitals and nursing note reviewed.  Constitutional:      General: She is not in acute distress.    Appearance: Normal appearance. She is well-developed. She is obese. She is not ill-appearing, toxic-appearing or diaphoretic.     Interventions: Face mask in place.  HENT:     Head: Normocephalic and atraumatic.     Right Ear: External ear normal.     Left Ear: External ear normal.  Eyes:     General: Lids are normal. No scleral icterus.       Right eye: No discharge.        Left eye: No discharge.     Conjunctiva/sclera: Conjunctivae normal.  Neck:     Trachea: Phonation normal. No tracheal deviation.  Cardiovascular:     Rate and Rhythm: Normal rate and regular rhythm.     Pulses: Normal pulses.          Radial pulses are 2+ on the right side and 2+ on the left side.       Posterior tibial pulses are 2+ on the right side and 2+ on the left side.     Heart sounds: Normal heart sounds. No murmur heard.  No friction rub. No gallop.     Pulmonary:     Effort: Pulmonary effort is normal. No respiratory distress.     Breath sounds: Normal breath sounds. No stridor. No wheezing, rhonchi or rales.  Chest:     Chest wall: No tenderness.  Abdominal:     General: Bowel sounds are normal. There is no distension.     Palpations: Abdomen is soft.     Tenderness: There is no abdominal tenderness.  Musculoskeletal:     Cervical back: Normal range of motion and neck supple.     Right lower leg: No edema.     Left lower leg: No edema.  Lymphadenopathy:     Cervical: No cervical adenopathy.  Skin:    General: Skin is warm and dry.     Coloration: Skin is not jaundiced or pale.     Findings: No rash.  Neurological:     Mental Status: She is alert. Mental status is at baseline.     Motor: No abnormal muscle tone.     Gait: Gait normal.  Psychiatric:        Mood and Affect: Mood normal.        Speech: Speech normal.        Behavior: Behavior normal.     Diabetic foot exam was performed with the following findings:   No deformities, ulcerations, or other skin breakdown Normal sensation of 10g monofilament Intact posterior tibialis and dorsalis pedis pulses      Assessment & Plan:     ICD-10-CM   1. Hyperlipemia, mixed  E78.2 rosuvastatin (CRESTOR) 20 MG  tablet   out of meds, restart statin, labs done previously and reviewed, will need repeat labs after resuming statin  2. Hypertension, unspecified type  I10 lisinopril (ZESTRIL) 20 MG tablet   stable, well controlled, continue lisinopril  3. Type 2 diabetes mellitus with hyperglycemia, with long-term current use of insulin (HCC)  E11.65 dapagliflozin propanediol (FARXIGA) 10 MG TABS tablet   Z79.4 metFORMIN (GLUCOPHAGE-XR) 500 MG 24 hr tablet    rosuvastatin (CRESTOR) 20 MG tablet    sitaGLIPtin (JANUVIA) 100 MG tablet    Hemoglobin A1C   uncontrolled, off meds and not seeing specialist, labs recently done but A1C due today, foot exam done, meds refilled  4. Anxiety  and depression  F41.9 busPIRone (BUSPAR) 7.5 MG tablet   F32.9 DULoxetine (CYMBALTA) 30 MG capsule   currently not well controlled with cymbalta and buspar  5. Chronic obstructive pulmonary disease, unspecified COPD type (HCC)  J44.9 albuterol (PROVENTIL HFA) 108 (90 Base) MCG/ACT inhaler   Sx stable, uses albuterol rescue inhaler  6. Severe episode of recurrent major depressive disorder, without psychotic features ([place])  F33.2 DULoxetine (CYMBALTA) 30 MG capsule   worse sx on cymbalta, offered adding other meds or referral to psych, pt declined  Labs were previously done and reviewed today: 1. Encounter for medication monitoring  - CBC with Differential/Platelet - COMPLETE METABOLIC PANEL WITH GFR - Lipid panel  2. Hyperlipemia, mixed  - COMPLETE METABOLIC PANEL WITH GFR - Lipid panel  3. Hypertension, unspecified type  - COMPLETE METABOLIC PANEL WITH GFR  4. Type 2 diabetes mellitus with hyperglycemia, with long-term current use of insulin (HCC)  - COMPLETE METABOLIC PANEL WITH GFR - Lipid panel     Pt encouraged to get back on all meds, she was encouraged to check her blood sugars more often than she was previously, encouraged to also monitor BP and bring glucometer to next appt if unable to f/up with endocrinology   Return for 3 month f/up DM/HLD labs- in person .   [name], PA-C 12/08/19 2:53 PM

## 2019-12-08 NOTE — Telephone Encounter (Signed)
erro  neous encounter

## 2019-12-09 LAB — HEMOGLOBIN A1C
Hgb A1c MFr Bld: 12.3 % of total Hgb — ABNORMAL HIGH (ref <5.7)
Mean Plasma Glucose: 306 (calc)
eAG (mmol/L): 17 (calc)

## 2019-12-09 NOTE — Progress Notes (Signed)
Pt got labs done but rescheduled appt today  1. Encounter for medication monitoring  - CBC with Differential/Platelet - COMPLETE METABOLIC PANEL WITH GFR - Lipid panel  2. Hyperlipemia, mixed  - COMPLETE METABOLIC PANEL WITH GFR - Lipid panel  3. Hypertension, unspecified type  - COMPLETE METABOLIC PANEL WITH GFR  4. Type 2 diabetes mellitus with hyperglycemia, with long-term current use of insulin (HCC)  - COMPLETE METABOLIC PANEL WITH GFR - Lipid panel  A1C was ordered but cancelled - she needs still

## 2019-12-14 DIAGNOSIS — Z23 Encounter for immunization: Secondary | ICD-10-CM | POA: Diagnosis not present

## 2019-12-17 DIAGNOSIS — Z419 Encounter for procedure for purposes other than remedying health state, unspecified: Secondary | ICD-10-CM | POA: Diagnosis not present

## 2020-01-16 ENCOUNTER — Encounter: Payer: Self-pay | Admitting: Family Medicine

## 2020-01-17 DIAGNOSIS — Z419 Encounter for procedure for purposes other than remedying health state, unspecified: Secondary | ICD-10-CM | POA: Diagnosis not present

## 2020-02-17 DIAGNOSIS — Z419 Encounter for procedure for purposes other than remedying health state, unspecified: Secondary | ICD-10-CM | POA: Diagnosis not present

## 2020-03-09 NOTE — Progress Notes (Signed)
Name: Lauren [name]   MRN: [id number]    DOB: [birth year]   Date:03/10/2020       Progress Note  Chief Complaint  Patient presents with  . Diabetes  . Hyperlipidemia     Subjective:   Lauren [name] is a 44 y.o. female, presents to clinic for routine follow-up  DM:   Pt managing DM with Farxiga 8m and Januvia 100 mg -  Reports good med compliance Pt has no concerning SE from meds Blood sugars - needs new meter Denies: Polyuria, polydipsia, vision changes, neuropathy, hypoglycemia  -  Recent pertinent labs: Lab Results  Component Value Date   HGBA1C 12.3 (H) 12/08/2019   HGBA1C 10.1 (A) 12/27/2017   HGBA1C 7.3 (H) 07/11/2017   Standard of care and health maintenance: Urine Microalbumin:  ordered Foot exam:  12/07/19 DM eye exam:  12/01/19 ACEI/ARB:  lisinopril Statin:  crestor   Hyperlipidemia: Currently treated with crestor qd, pt reports good med compliance Last Lipids: Lab Results  Component Value Date   CHOL 212 (H) 11/27/2019   HDL 29 (L) 11/27/2019   LDLCALC 138 (H) 11/27/2019   TRIG 315 (H) 11/27/2019   CHOLHDL 7.3 (H) 11/27/2019   - Denies: Chest pain, shortness of breath, myalgias, claudication  Hypertension:  Currently managed on Lisinopril 219mqd Pt reports good med compliance and denies any SE.   Blood pressure today is well controlled. BP Readings from Last 3 Encounters:  03/10/20 110/76  12/08/19 124/82  01/13/19 133/86   Pt denies CP, SOB, exertional sx, LE edema, palpitation, Ha's, visual disturbances, lightheadedness, hypotension, syncope.  Smoking currently 1 ppd - she thinks she had  wellbutrin before to help smoking with dr shManuella Ghazi  Current Outpatient Medications:  .  albuterol (PROVENTIL HFA) 108 (90 Base) MCG/ACT inhaler, Inhale 2 puffs into the lungs every 6 (six) hours as needed for wheezing or shortness of breath., Disp: 18 g, Rfl: 2 .  busPIRone (BUSPAR) 7.5 MG tablet, Take 1 tablet (7.5 mg total) by mouth 3 (three) times  daily., Disp: 270 tablet, Rfl: 1 .  dapagliflozin propanediol (FARXIGA) 10 MG TABS tablet, Take 1 tablet (10 mg total) by mouth daily., Disp: 30 tablet, Rfl: 11 .  DULoxetine (CYMBALTA) 30 MG capsule, Take 1 capsule (30 mg total) by mouth 2 (two) times daily., Disp: 180 capsule, Rfl: 3 .  famotidine (PEPCID) 20 MG tablet, Take 1 tablet (20 mg total) by mouth at bedtime., Disp: 90 tablet, Rfl: 0 .  fluticasone (FLONASE) 50 MCG/ACT nasal spray, SPRAY 2 SPRAYS INTO EACH NOSTRIL EVERY DAY, Disp: 48 mL, Rfl: 0 .  lisinopril (ZESTRIL) 20 MG tablet, Take 1 tablet (20 mg total) by mouth daily., Disp: 90 tablet, Rfl: 3 .  metFORMIN (GLUCOPHAGE-XR) 500 MG 24 hr tablet, Take 2 tablets (1,000 mg total) by mouth daily., Disp: 180 tablet, Rfl: 3 .  rosuvastatin (CRESTOR) 20 MG tablet, Take 1 tablet (20 mg total) by mouth daily., Disp: 90 tablet, Rfl: 3 .  sitaGLIPtin (JANUVIA) 100 MG tablet, Take 1 tablet (100 mg total) by mouth daily., Disp: 90 tablet, Rfl: 3  Patient Active Problem List   Diagnosis Date Noted  . Morbid obesity ([place]/29/2019  . Allergic rhinitis, seasonal 04/23/2017  . Genital herpes 04/23/2017  . Type 2 diabetes mellitus, uncontrolled ([place]/11/2016  . 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 Bronchitis 03/16/2016  . Cholesteatoma of attic of right ear 12/05/2015  . Nocturnal cough 11/29/2015  .  GERD (gastroesophageal reflux disease) 11/29/2015  . Acid reflux 09/19/2015  . Screening for breast cancer 09/19/2015  . Chronic pelvic pain in female 09/19/2015  . Vitamin D deficiency 12/13/2014  . History of herpes genitalis 12/13/2014  . Hyperlipidemia 05/26/2014  . Smoker 05/26/2014    Past Surgical History:  Procedure Laterality Date  . APPENDECTOMY    . CESAREAN SECTION     x 4   . COLONOSCOPY    . HERNIA REPAIR  09/25/2011  . TUBAL LIGATION    . TYMPANOMASTOIDECTOMY Right 12/07/2015   Procedure: TYMPANOMASTOIDECTOMY;  Surgeon: Clyde Canterbury,  MD;  Location: [facility] ORS;  Service: ENT;  Laterality: Right;    Family History  Problem Relation Age of Onset  . Hypertension Mother   . Hyperlipidemia Mother   . Diabetes Mother   . Arrhythmia Father 53       A-fib  . Alcohol abuse Father   . Heart disease Brother 43       stent x 1   . Alcoholism Brother   . Asthma Son   . Asperger's syndrome Son     Social History   Tobacco Use  . Smoking status: Current Every Day Smoker    Packs/day: 1.00    Years: 28.00    Pack years: 28.00    Types: Cigarettes  . Smokeless tobacco: Never Used  Vaping Use  . Vaping Use: Never used  Substance Use Topics  . Alcohol use: Not Currently    Alcohol/week: 0.0 standard drinks  . Drug use: No     No Known Allergies  Health Maintenance  Topic Date Due  . PAP SMEAR-Modifier  09/19/2018  . HIV Screening  05/11/2020 (Originally 06/06/1990)  . INFLUENZA VACCINE  09/15/2020 (Originally 01/17/2020)  . Hepatitis C Screening  12/07/2020 (Originally [birth year])  . HEMOGLOBIN A1C  06/08/2020  . OPHTHALMOLOGY EXAM  11/30/2020  . FOOT EXAM  12/07/2020  . TETANUS/TDAP  07/01/2021  . PNEUMOCOCCAL POLYSACCHARIDE VACCINE AGE 67-64 HIGH RISK  Completed  . COVID-19 Vaccine  Completed    Chart Review Today: I personally reviewed active problem list, medication list, allergies, family history, social history, health maintenance, notes from last encounter, lab results, imaging with the patient/caregiver today.   Review of Systems  10 Systems reviewed and are negative for acute change except as noted in the HPI.    Objective:   Vitals:   03/10/20 1419  BP: 110/76  Pulse: 85  Resp: 16  Temp: 99.1 F (37.3 C)  TempSrc: Oral  SpO2: 99%  Weight: 183 lb 1.6 oz (83.1 kg)  Height: 5' 2" (1.575 m)    Body mass index is 33.49 kg/m.  Physical Exam Vitals and nursing note reviewed.  Constitutional:      General: She is not in acute distress.    Appearance: Normal appearance. She is  well-developed. She is obese. She is not ill-appearing, toxic-appearing or diaphoretic.     Interventions: Face mask in place.  HENT:     Head: Normocephalic and atraumatic.     Right Ear: External ear normal.     Left Ear: External ear normal.  Eyes:     General: Lids are normal. No scleral icterus.       Right eye: No discharge.        Left eye: No discharge.     Conjunctiva/sclera: Conjunctivae normal.  Neck:     Trachea: Phonation normal. No tracheal deviation.  Cardiovascular:     Rate and Rhythm: Normal  rate and regular rhythm.     Pulses: Normal pulses.          Radial pulses are 2+ on the right side and 2+ on the left side.       Posterior tibial pulses are 2+ on the right side and 2+ on the left side.     Heart sounds: Normal heart sounds. No murmur heard.  No friction rub. No gallop.   Pulmonary:     Effort: Pulmonary effort is normal. No respiratory distress.     Breath sounds: Normal breath sounds. No stridor. No wheezing, rhonchi or rales.  Chest:     Chest wall: No tenderness.  Abdominal:     General: Bowel sounds are normal. There is no distension.     Palpations: Abdomen is soft.     Tenderness: There is no abdominal tenderness. There is no guarding.  Musculoskeletal:     Right lower leg: No edema.     Left lower leg: No edema.  Skin:    General: Skin is warm and dry.     Coloration: Skin is not jaundiced or pale.     Findings: No rash.  Neurological:     Mental Status: She is alert. Mental status is at baseline.     Motor: No abnormal muscle tone.     Gait: Gait normal.  Psychiatric:        Mood and Affect: Mood normal.        Speech: Speech normal.        Behavior: Behavior normal.     Depression screen [facility] 2/9 03/10/2020 12/08/2019 06/26/2019  Decreased Interest 2 1 0  Down, Depressed, Hopeless 2 1 0  PHQ - 2 Score 4 2 0  Altered sleeping 1 2 0  Tired, decreased energy 1 2 0  Change in appetite 0 1 0  Feeling bad or failure about yourself  1 2 0    Trouble concentrating 2 0 0  Moving slowly or fidgety/restless 1 1 0  Suicidal thoughts 0 1 0  PHQ-9 Score 10 11 0  Difficult doing work/chores Somewhat difficult Somewhat difficult Not difficult at all  Some recent data might be hidden       Assessment & Plan:   1. Type 2 diabetes mellitus with hyperglycemia, with long-term current use of insulin (HCC) Diabetes has been uncontrolled was greater than 11 at last office visit.  She is working on diet and exercise changes states that she has been compliant with her medications, she has been losing weight.  She does appear well and looks much healthier and younger.  Hopefully her weight loss is from her diet and lifestyle changes and not from severely elevated and uncontrolled diabetes. - Hemoglobin A1C - Lipid panel - COMPLETE METABOLIC PANEL WITH GFR - Microalbumin, urine - blood glucose meter kit and supplies KIT; Dispense based on patient and insurance preference. Use up to once a day for blood sugar monitoring . (FOR ICD E11.65)  Dispense: 1 each; Refill: 0  2. Hyperlipemia, mixed Plan with statins, no myalgias concerns or side effects, she has been working on diet and weight loss - Lipid panel - COMPLETE METABOLIC PANEL WITH GFR  3. Hypertension, unspecified type Stable, well controlled - COMPLETE METABOLIC PANEL WITH GFR  4. Severe episode of recurrent major depressive disorder, without psychotic features [facility]) Patient reports that her moods have been stable, PHQ-9 reviewed today and positive patient does not want to adjust medications-  continue BuSpar as needed  and Cymbalta  5. Elevated LFTs Recheck liver enzymes with weight loss and improve diet and exercise efforts - COMPLETE METABOLIC PANEL WITH GFR  6. Encounter for medication monitoring - Hemoglobin A1C - Lipid panel - COMPLETE METABOLIC PANEL WITH GFR   Return in about 3 months (around 06/09/2020) for Routine follow-up.   [name], PA-C 03/10/20 2:49  PM

## 2020-03-10 ENCOUNTER — Ambulatory Visit (INDEPENDENT_AMBULATORY_CARE_PROVIDER_SITE_OTHER): Payer: Medicaid Other | Admitting: Family Medicine

## 2020-03-10 ENCOUNTER — Other Ambulatory Visit: Payer: Self-pay

## 2020-03-10 ENCOUNTER — Encounter: Payer: Self-pay | Admitting: Family Medicine

## 2020-03-10 VITALS — BP 110/76 | HR 85 | Temp 99.1°F | Resp 16 | Ht 62.0 in | Wt 183.1 lb

## 2020-03-10 DIAGNOSIS — I10 Essential (primary) hypertension: Secondary | ICD-10-CM | POA: Diagnosis not present

## 2020-03-10 DIAGNOSIS — E782 Mixed hyperlipidemia: Secondary | ICD-10-CM

## 2020-03-10 DIAGNOSIS — F332 Major depressive disorder, recurrent severe without psychotic features: Secondary | ICD-10-CM

## 2020-03-10 DIAGNOSIS — Z5181 Encounter for therapeutic drug level monitoring: Secondary | ICD-10-CM

## 2020-03-10 DIAGNOSIS — Z794 Long term (current) use of insulin: Secondary | ICD-10-CM | POA: Diagnosis not present

## 2020-03-10 DIAGNOSIS — R7989 Other specified abnormal findings of blood chemistry: Secondary | ICD-10-CM | POA: Diagnosis not present

## 2020-03-10 DIAGNOSIS — E1165 Type 2 diabetes mellitus with hyperglycemia: Secondary | ICD-10-CM | POA: Diagnosis not present

## 2020-03-10 MED ORDER — BLOOD GLUCOSE MONITOR KIT: PACK | 0 refills | Status: DC

## 2020-03-11 LAB — COMPLETE METABOLIC PANEL WITH GFR
AG Ratio: 1.6 (calc) (ref 1.0–2.5)
ALT: 49 U/L — ABNORMAL HIGH (ref 6–29)
AST: 42 U/L — ABNORMAL HIGH (ref 10–30)
Albumin: 4.2 g/dL (ref 3.6–5.1)
Alkaline phosphatase (APISO): 63 U/L (ref 31–125)
BUN: 13 mg/dL (ref 7–25)
CO2: 24 mmol/L (ref 20–32)
Calcium: 9.4 mg/dL (ref 8.6–10.2)
Chloride: 107 mmol/L (ref 98–110)
Creat: 0.71 mg/dL (ref 0.50–1.10)
GFR, Est African American: 120 mL/min/{1.73_m2} (ref >=60)
GFR, Est Non African American: 104 mL/min/{1.73_m2} (ref >=60)
Globulin: 2.7 g/dL (calc) (ref 1.9–3.7)
Glucose, Bld: 110 mg/dL — ABNORMAL HIGH (ref 65–99)
Potassium: 4.1 mmol/L (ref 3.5–5.3)
Sodium: 140 mmol/L (ref 135–146)
Total Bilirubin: 0.5 mg/dL (ref 0.2–1.2)
Total Protein: 6.9 g/dL (ref 6.1–8.1)

## 2020-03-11 LAB — MICROALBUMIN, URINE: Microalb, Ur: 0.7 mg/dL

## 2020-03-11 LAB — HEMOGLOBIN A1C
Hgb A1c MFr Bld: 8 % of total Hgb — ABNORMAL HIGH (ref <5.7)
Mean Plasma Glucose: 183 (calc)
eAG (mmol/L): 10.1 (calc)

## 2020-03-11 LAB — LIPID PANEL
Cholesterol: 177 mg/dL (ref <200)
HDL: 31 mg/dL — ABNORMAL LOW (ref >=50)
LDL Cholesterol (Calc): 121 mg/dL (calc) — ABNORMAL HIGH
Non-HDL Cholesterol (Calc): 146 mg/dL (calc) — ABNORMAL HIGH (ref <130)
Total CHOL/HDL Ratio: 5.7 (calc) — ABNORMAL HIGH (ref <5.0)
Triglycerides: 130 mg/dL (ref <150)

## 2020-03-16 ENCOUNTER — Encounter: Payer: Self-pay | Admitting: Family Medicine

## 2020-03-18 DIAGNOSIS — Z419 Encounter for procedure for purposes other than remedying health state, unspecified: Secondary | ICD-10-CM | POA: Diagnosis not present

## 2020-03-18 NOTE — Telephone Encounter (Signed)
There are only nicotine patches, nicorette gum, chantix, and you can give her the [facility] stop smoking program info or contact # or she can reach out to her health insurance plan and see what programs they may have.  Any Rx's need an appt

## 2020-03-22 ENCOUNTER — Encounter: Payer: Self-pay | Admitting: Family Medicine

## 2020-03-28 ENCOUNTER — Telehealth: Payer: Medicaid Other | Admitting: Emergency Medicine

## 2020-03-28 DIAGNOSIS — R300 Dysuria: Secondary | ICD-10-CM | POA: Diagnosis not present

## 2020-03-28 MED ORDER — NITROFURANTOIN MONOHYD MACRO 100 MG PO CAPS: 100.0000 mg | ORAL_CAPSULE | Freq: Two times a day (BID) | ORAL | 0 refills | Status: DC

## 2020-03-28 NOTE — Progress Notes (Signed)
We are sorry that you are not feeling well.  Here is how we plan to help!  Based on what you shared with me it looks like you most likely have a simple urinary tract infection.  A UTI (Urinary Tract Infection) is a bacterial infection of the bladder.  Most cases of urinary tract infections are simple to treat but a key part of your care is to encourage you to drink plenty of fluids and watch your symptoms carefully.  I have prescribed MacroBid 100 mg twice a day for 5 days.  Your symptoms should gradually improve. Call us if the burning in your urine worsens, you develop worsening fever, back pain or pelvic pain or if your symptoms do not resolve after completing the antibiotic.  Urinary tract infections can be prevented by drinking plenty of water to keep your body hydrated.  Also be sure when you wipe, wipe from front to back and don't hold it in!  If possible, empty your bladder every 4 hours.  Your e-visit answers were reviewed by a board certified advanced clinical practitioner to complete your personal care plan.  Depending on the condition, your plan could have included both over the counter or prescription medications.  If there is a problem please reply  once you have received a response from your provider.  Your safety is important to us.  If you have drug allergies check your prescription carefully.    You can use MyChart to ask questions about today's visit, request a non-urgent call back, or ask for a work or school excuse for 24 hours related to this e-Visit. If it has been greater than 24 hours you will need to follow up with your provider, or enter a new e-Visit to address those concerns.   You will get an e-mail in the next two days asking about your experience.  I hope that your e-visit has been valuable and will speed your recovery. Thank you for using e-visits.   **Please do not respond to this message unless you have follow up questions.** Greater than 5 but less than 10  minutes spent researching, coordinating, and implementing care for this patient today  

## 2020-04-05 ENCOUNTER — Telehealth: Payer: Medicaid Other | Admitting: Physician Assistant

## 2020-04-05 ENCOUNTER — Other Ambulatory Visit: Payer: Medicaid Other

## 2020-04-05 DIAGNOSIS — R059 Cough, unspecified: Secondary | ICD-10-CM

## 2020-04-05 MED ORDER — BENZONATATE 100 MG PO CAPS: 100.0000 mg | ORAL_CAPSULE | Freq: Three times a day (TID) | ORAL | 0 refills | Status: AC

## 2020-04-05 MED ORDER — PREDNISONE 50 MG PO TABS: 50.0000 mg | ORAL_TABLET | Freq: Every day | ORAL | 0 refills | Status: AC

## 2020-04-05 NOTE — Progress Notes (Signed)
We are sorry that you are not feeling well.  Here is how we plan to help!  Based on your presentation I believe you most likely have A cough due to a virus.  This is called viral bronchitis and is best treated by rest, plenty of fluids and control of the cough.  You may use Ibuprofen or Tylenol as directed to help your symptoms.     In addition you may use A prescription cough medication called Tessalon Perles 100mg . You may take 1-2 capsules every 8 hours as needed for your cough.  I have prescribed Prednisone as well for your symptoms.  From your responses in the eVisit questionnaire you describe inflammation in the upper respiratory tract which is causing a significant cough.  This is commonly called Bronchitis and has four common causes:    Allergies  Viral Infections  Acid Reflux  Bacterial Infection Allergies, viruses and acid reflux are treated by controlling symptoms or eliminating the cause. An example might be a cough caused by taking certain blood pressure medications. You stop the cough by changing the medication. Another example might be a cough caused by acid reflux. Controlling the reflux helps control the cough.  USE OF BRONCHODILATOR ("RESCUE") INHALERS: There is a risk from using your bronchodilator too frequently.  The risk is that over-reliance on a medication which only relaxes the muscles surrounding the breathing tubes can reduce the effectiveness of medications prescribed to reduce swelling and congestion of the tubes themselves.  Although you feel brief relief from the bronchodilator inhaler, your asthma may actually be worsening with the tubes becoming more swollen and filled with mucus.  This can delay other crucial treatments, such as oral steroid medications. If you need to use a bronchodilator inhaler daily, several times per day, you should discuss this with your provider.  There are probably better treatments that could be used to keep your asthma under control.      HOME CARE . Only take medications as instructed by your medical team. . Complete the entire course of an antibiotic. . Drink plenty of fluids and get plenty of rest. . Avoid close contacts especially the very young and the elderly . Cover your mouth if you cough or cough into your sleeve. . Always remember to wash your hands . A steam or ultrasonic humidifier can help congestion.   GET HELP RIGHT AWAY IF: . You develop worsening fever. . You become short of breath . You cough up blood. . Your symptoms persist after you have completed your treatment plan MAKE SURE YOU   Understand these instructions.  Will watch your condition.  Will get help right away if you are not doing well or get worse.  Your e-visit answers were reviewed by a board certified advanced clinical practitioner to complete your personal care plan.  Depending on the condition, your plan could have included both over the counter or prescription medications. If there is a problem please reply  once you have received a response from your provider. Your safety is important to [place].  If you have drug allergies check your prescription carefully.    You can use MyChart to ask questions about today's visit, request a non-urgent call back, or ask for a work or school excuse for 24 hours related to this e-Visit. If it has been greater than 24 hours you will need to follow up with your provider, or enter a new e-Visit to address those concerns. You will get an e-mail in the  next two days asking about your experience.  I hope that your e-visit has been valuable and will speed your recovery. Thank you for using e-visits.   Approximately 5 minutes was spent documenting and reviewing patient's chart.

## 2020-04-15 ENCOUNTER — Encounter: Payer: Self-pay | Admitting: Family Medicine

## 2020-04-15 ENCOUNTER — Telehealth (INDEPENDENT_AMBULATORY_CARE_PROVIDER_SITE_OTHER): Payer: Medicaid Other | Admitting: Family Medicine

## 2020-04-15 ENCOUNTER — Ambulatory Visit
Admission: RE | Admit: 2020-04-15 | Discharge: 2020-04-15 | Disposition: A | Discharge status: Home / Self Care | Payer: Medicaid Other | Attending: Family Medicine | Admitting: Family Medicine

## 2020-04-15 ENCOUNTER — Other Ambulatory Visit: Payer: Self-pay

## 2020-04-15 ENCOUNTER — Ambulatory Visit
Admission: RE | Admit: 2020-04-15 | Discharge: 2020-04-15 | Disposition: A | Discharge status: Home / Self Care | Payer: Medicaid Other | Source: Ambulatory Visit | Attending: Family Medicine | Admitting: Family Medicine

## 2020-04-15 DIAGNOSIS — R0781 Pleurodynia: Secondary | ICD-10-CM | POA: Insufficient documentation

## 2020-04-15 DIAGNOSIS — R079 Chest pain, unspecified: Secondary | ICD-10-CM | POA: Diagnosis not present

## 2020-04-15 DIAGNOSIS — R059 Cough, unspecified: Secondary | ICD-10-CM

## 2020-04-15 DIAGNOSIS — J209 Acute bronchitis, unspecified: Secondary | ICD-10-CM

## 2020-04-15 MED ORDER — BENZONATATE 100 MG PO CAPS: 100.0000 mg | ORAL_CAPSULE | Freq: Three times a day (TID) | ORAL | 0 refills | Status: DC | PRN

## 2020-04-15 MED ORDER — PREDNISONE 20 MG PO TABS: ORAL_TABLET | ORAL | 0 refills | Status: DC

## 2020-04-15 MED ORDER — PROMETHAZINE-DM 6.25-15 MG/5ML PO SYRP: 2.5000 mL | ORAL_SOLUTION | Freq: Three times a day (TID) | ORAL | 0 refills | Status: DC | PRN

## 2020-04-15 MED ORDER — AZITHROMYCIN 250 MG PO TABS: 250.0000 mg | ORAL_TABLET | Freq: Every day | ORAL | 0 refills | Status: DC

## 2020-04-15 NOTE — Progress Notes (Signed)
Name: Lauren [name]   MRN: [id number]    DOB: 20-Jul-1975   Date:04/15/2020       Progress Note  Subjective:    Chief Complaint  Chief Complaint  Patient presents with  . URI    cough,congested recently did e-visit,     I connected with  Lauren [name] on 04/15/20 at  2:20 PM EDT by telephone and verified that I am speaking with the correct person using two identifiers.   I discussed the limitations, risks, security and privacy concerns of performing an evaluation and management service by telephone and the availability of in person appointments. Staff also discussed with the patient that there may be a patient responsible charge related to this service.  Patient verbalized understanding and agreed to proceed with encounter. Patient Location: home Provider Location: [facility] Additional Individuals present: none  Sx worsening, hasn't been able to smoke x 4 d   Cough This is a recurrent problem. The current episode started 1 to 4 weeks ago. Progression since onset: was improving a little when on prednisone, but once out of meds sx worsened over past week. The problem occurs constantly. The cough is productive of purulent sputum (sputum yellow to green). Associated symptoms include chest pain, shortness of breath and wheezing. Pertinent negatives include no chills, ear congestion, fever, headaches, heartburn, hemoptysis, myalgias, nasal congestion, postnasal drip, rash, rhinorrhea, sore throat, sweats or weight loss. Exacerbated by: activity. Risk factors for lung disease include smoking/tobacco exposure. She has tried a beta-agonist inhaler, oral steroids and prescription cough suppressant for the symptoms. Her past medical history is significant for bronchitis, COPD and environmental allergies.    Cough and bronchitis sx for over 10 d, typical for her this time of year - got 5 days of prednisone and tessalon and was feeling better until out of prednisone, usually gets longer rx COVID  tested neg just prior to onset of sx  Patient Active Problem List   Diagnosis Date Noted  . Morbid obesity ([place]) 01/13/2018  . Allergic rhinitis, seasonal 04/23/2017  . Genital herpes 04/23/2017  . Type 2 diabetes mellitus, uncontrolled ([place]) 04/23/2017  . 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 Bronchitis 03/16/2016  . Cholesteatoma of attic of right ear 12/05/2015  . Nocturnal cough 11/29/2015  . GERD (gastroesophageal reflux disease) 11/29/2015  . Acid reflux 09/19/2015  . Screening for breast cancer 09/19/2015  . Chronic pelvic pain in female 09/19/2015  . Vitamin D deficiency 12/13/2014  . History of herpes genitalis 12/13/2014  . Hyperlipidemia 05/26/2014  . Smoker 05/26/2014    Social History   Tobacco Use  . Smoking status: Current Every Day Smoker    Packs/day: 1.00    Years: 28.00    Pack years: 28.00    Types: Cigarettes  . Smokeless tobacco: Never Used  Substance Use Topics  . Alcohol use: Not Currently    Alcohol/week: 0.0 standard drinks     Current Outpatient Medications:  .  albuterol (PROVENTIL HFA) 108 (90 Base) MCG/ACT inhaler, Inhale 2 puffs into the lungs every 6 (six) hours as needed for wheezing or shortness of breath., Disp: 18 g, Rfl: 2 .  busPIRone (BUSPAR) 7.5 MG tablet, Take 1 tablet (7.5 mg total) by mouth 3 (three) times daily., Disp: 270 tablet, Rfl: 1 .  dapagliflozin propanediol (FARXIGA) 10 MG TABS tablet, Take 1 tablet (10 mg total) by mouth daily., Disp: 30 tablet, Rfl: 11 .  DULoxetine (CYMBALTA) 30 MG capsule, Take  1 capsule (30 mg total) by mouth 2 (two) times daily., Disp: 180 capsule, Rfl: 3 .  famotidine (PEPCID) 20 MG tablet, Take 1 tablet (20 mg total) by mouth at bedtime., Disp: 90 tablet, Rfl: 0 .  fluticasone (FLONASE) 50 MCG/ACT nasal spray, SPRAY 2 SPRAYS INTO EACH NOSTRIL EVERY DAY, Disp: 48 mL, Rfl: 0 .  lisinopril (ZESTRIL) 20 MG tablet, Take 1 tablet (20 mg total) by mouth daily., Disp: 90  tablet, Rfl: 3 .  metFORMIN (GLUCOPHAGE-XR) 500 MG 24 hr tablet, Take 2 tablets (1,000 mg total) by mouth daily., Disp: 180 tablet, Rfl: 3 .  rosuvastatin (CRESTOR) 20 MG tablet, Take 1 tablet (20 mg total) by mouth daily., Disp: 90 tablet, Rfl: 3 .  sitaGLIPtin (JANUVIA) 100 MG tablet, Take 1 tablet (100 mg total) by mouth daily., Disp: 90 tablet, Rfl: 3 .  blood glucose meter kit and supplies KIT, Dispense based on patient and insurance preference. Use up to once a day for blood sugar monitoring . (FOR ICD E11.65) (Patient not taking: Reported on 04/15/2020), Disp: 1 each, Rfl: 0 .  nitrofurantoin, macrocrystal-monohydrate, (MACROBID) 100 MG capsule, Take 1 capsule (100 mg total) by mouth 2 (two) times daily. (Patient not taking: Reported on 04/15/2020), Disp: 10 capsule, Rfl: 0  No Known Allergies  Chart Review: I personally reviewed active problem list, medication list, allergies, family history, social history, health maintenance, notes from last encounter, lab results, imaging with the patient/caregiver today.   Review of Systems  Constitutional: Negative for chills, fever and weight loss.  HENT: Negative for postnasal drip, rhinorrhea and sore throat.   Respiratory: Positive for cough, shortness of breath and wheezing. Negative for hemoptysis.   Cardiovascular: Positive for chest pain.  Gastrointestinal: Negative for heartburn.  Musculoskeletal: Negative for myalgias.  Skin: Negative for rash.  Allergic/Immunologic: Positive for environmental allergies.  Neurological: Negative for headaches.  10 Systems reviewed and are negative for acute change except as noted in the HPI.    Objective:    Virtual encounter, vitals limited, only able to obtain the following There were no vitals filed for this visit. There is no height or weight on file to calculate BMI. Nursing Note and Vital Signs reviewed.  Physical Exam Pt alert, intermittent coughing, able to speak in full and complete  sentences, no audible wheeze PE limited by telephone encounter  No results found for this or any previous visit (from the past 72 hour(s)).  Assessment and Plan:     ICD-10-CM   1. Cough  R05.9 DG Chest 2 View  2. Pleuritic chest pain  R07.81 DG Chest 2 View  3. Acute bronchitis, unspecified organism  J20.9    with smoking hx and recurrent sx, suspect COPD, CXR to r/o CAP     Left sided pleuritic CP with productive cough, SOB, wheeze ongoing for more than 10 d Was neg for COVID in the last 2 weeks, vaccinated pt, smoker, hx of recurrent bronchitis about this time of year No fever, chills, sweats, severe fatigue She does have worse SOB with exertion It did help when on steroids and she is using albuterol inhaler  CXR to eval, longer steroid tx, advised for her to use mucinex and other OTC cough suppresents Tx for acute bronchitis She needs to f/up urgently if any wosening CP or development of severe SOB/severe fatigue, fever, sweats etc to be rechecked   -Red flags and when to present for emergency care or RTC including but not limited to new/worsening/un-resolving symptoms,  reviewed with patient at time of visit. Follow up and care instructions discussed and provided in AVS. - I discussed the assessment and treatment plan with the patient. The patient was provided an opportunity to ask questions and all were answered. The patient agreed with the plan and demonstrated an understanding of the instructions.  - The patient was advised to call back or seek an in-person evaluation if the symptoms worsen or if the condition fails to improve as anticipated.  I provided 30+ minutes of non-face-to-face time during this encounter.  [name], PA-C 04/15/20 2:48 PM    04/15/20 5:20 PM Xray results reviewed: CLINICAL DATA:  Cough and chest pain  EXAM: CHEST - 2 VIEW  COMPARISON:  December 03, 2017  FINDINGS: Lungs are clear. Heart size and pulmonary vascularity are normal.  No adenopathy. There are foci of degenerative change in the thoracic spine. No pneumothorax.  IMPRESSION: Lungs clear.  Cardiac silhouette normal.  No evident adenopathy.   Electronically Signed   By: Lowella Grip III M.D.   On: 04/15/2020 15  Neg for pneumonia - continue with same tx as documented above

## 2020-04-18 ENCOUNTER — Encounter: Payer: Self-pay | Admitting: Family Medicine

## 2020-04-18 DIAGNOSIS — Z419 Encounter for procedure for purposes other than remedying health state, unspecified: Secondary | ICD-10-CM | POA: Diagnosis not present

## 2020-04-26 ENCOUNTER — Other Ambulatory Visit: Payer: Self-pay

## 2020-04-26 ENCOUNTER — Other Ambulatory Visit (HOSPITAL_COMMUNITY)
Admission: RE | Admit: 2020-04-26 | Discharge: 2020-04-26 | Disposition: A | Discharge status: Home / Self Care | Payer: Medicaid Other | Source: Ambulatory Visit | Attending: Family Medicine | Admitting: Family Medicine

## 2020-04-26 ENCOUNTER — Ambulatory Visit (INDEPENDENT_AMBULATORY_CARE_PROVIDER_SITE_OTHER): Payer: 59 | Admitting: Family Medicine

## 2020-04-26 ENCOUNTER — Encounter: Payer: Self-pay | Admitting: Family Medicine

## 2020-04-26 VITALS — BP 128/70 | HR 89 | Temp 98.0°F | Resp 16 | Ht 62.0 in | Wt 176.5 lb

## 2020-04-26 DIAGNOSIS — J209 Acute bronchitis, unspecified: Secondary | ICD-10-CM | POA: Diagnosis not present

## 2020-04-26 DIAGNOSIS — Z716 Tobacco abuse counseling: Secondary | ICD-10-CM

## 2020-04-26 DIAGNOSIS — Z124 Encounter for screening for malignant neoplasm of cervix: Secondary | ICD-10-CM

## 2020-04-26 DIAGNOSIS — Z1159 Encounter for screening for other viral diseases: Secondary | ICD-10-CM

## 2020-04-26 DIAGNOSIS — Z0000 Encounter for general adult medical examination without abnormal findings: Secondary | ICD-10-CM | POA: Diagnosis not present

## 2020-04-26 DIAGNOSIS — Z8619 Personal history of other infectious and parasitic diseases: Secondary | ICD-10-CM | POA: Diagnosis not present

## 2020-04-26 DIAGNOSIS — Z1239 Encounter for other screening for malignant neoplasm of breast: Secondary | ICD-10-CM

## 2020-04-26 DIAGNOSIS — F17200 Nicotine dependence, unspecified, uncomplicated: Secondary | ICD-10-CM

## 2020-04-26 DIAGNOSIS — Z114 Encounter for screening for human immunodeficiency virus [HIV]: Secondary | ICD-10-CM

## 2020-04-26 DIAGNOSIS — Z113 Encounter for screening for infections with a predominantly sexual mode of transmission: Secondary | ICD-10-CM | POA: Diagnosis not present

## 2020-04-26 DIAGNOSIS — F17210 Nicotine dependence, cigarettes, uncomplicated: Secondary | ICD-10-CM | POA: Diagnosis not present

## 2020-04-26 NOTE — Progress Notes (Signed)
Patient: Lauren [name], Female    DOB: [birth year], 44 y.o.   MRN: [id number] [name], PA-C Visit Date: 04/26/2020  Today's Provider: [name], PA-C   Chief Complaint  Patient presents with  . Gynecologic Exam   Subjective:     Lauren [name] is a 44 y.o. female presents to clinic for OV for PAP/HPV and cervical CA screening, is due for CPE- last time done wa in 2018 Agrees to CPE today with routine GYN exam  Exercise/Activity:  Has been exercising, less the past two weeks due to illness, walking miles and lifting Diet/nutrition:   Healthy still working hard on this and weight loss Sleep:  Sleeps pretty good, works second shift 6-7 hours  Wt Readings from Last 5 Encounters:  04/26/20 176 lb 8 oz (80.1 kg)  03/10/20 183 lb 1.6 oz (83.1 kg)  12/08/19 197 lb 9.6 oz (89.6 kg)  06/26/19 206 lb (93.4 kg)  05/29/19 202 lb (91.6 kg)   BMI Readings from Last 5 Encounters:  04/26/20 32.28 kg/m  03/10/20 33.49 kg/m  12/08/19 36.14 kg/m  06/26/19 37.68 kg/m  05/29/19 36.95 kg/m    USPSTF grade A and B recommendations - reviewed and addressed today  Depression:  Phq 9 completed today by patient, was reviewed by me with patient in the room PHQ score is neg, pt feels good PHQ 2/9 Scores 04/26/2020 04/15/2020 03/10/2020 12/08/2019  PHQ - 2 Score 0 0 4 2  PHQ- 9 Score 0 - 10 11   Depression screen [facility] 2/9 04/26/2020 04/15/2020 03/10/2020 12/08/2019 06/26/2019  Decreased Interest 0 0 2 1 0  Down, Depressed, Hopeless 0 0 2 1 0  PHQ - 2 Score 0 0 4 2 0  Altered sleeping 0 - 1 2 0  Tired, decreased energy 0 - 1 2 0  Change in appetite 0 - 0 1 0  Feeling bad or failure about yourself  0 - 1 2 0  Trouble concentrating 0 - 2 0 0  Moving slowly or fidgety/restless 0 - 1 1 0  Suicidal thoughts 0 - 0 1 0  PHQ-9 Score 0 - 10 11 0  Difficult doing work/chores Not difficult at all - Somewhat difficult Somewhat difficult Not difficult at all  Some recent data might be hidden    Alcohol screening:   Office Visit from 04/26/2020 in [facility]  AUDIT-C Score 0     Immunizations and Health Maintenance: Health Maintenance  Topic Date Due  . MAMMOGRAM  05/27/2015  . PAP SMEAR-Modifier  09/19/2018  . HIV Screening  05/11/2020 (Originally 06/06/1990)  . INFLUENZA VACCINE  09/15/2020 (Originally 01/17/2020)  . Hepatitis C Screening  12/07/2020 (Originally [birth year])  . HEMOGLOBIN A1C  09/07/2020  . OPHTHALMOLOGY EXAM  11/30/2020  . FOOT EXAM  12/07/2020  . TETANUS/TDAP  07/01/2021  . PNEUMOCOCCAL POLYSACCHARIDE VACCINE AGE 2-64 HIGH RISK  Completed  . COVID-19 Vaccine  Completed     Hep C Screening:  due  STD testing and prevention (HIV/chl/gon/syphilis):  see above, no additional testing desired by pt today -  Intimate partner violence:denies  Sexual History/Pain during Intercourse: recently single, but sexually active- using condoms, one newer female partner last 3 weeks, no sx, no sx or concerns with intercourse  Menstrual History/LMP/Abnormal Bleeding:  Regular Patient's last menstrual period was 04/13/2020.  Incontinence Symptoms:  None, denies   Breast cancer:  Ordered - not done in years Last Mammogram: *see HM list above BRCA gene screening:  none  Cervical cancer screening: due  Pt denies family hx of cancers - breast, ovarian, uterine, colon:     Osteoporosis:   Discussion on osteoporosis per age, including high calcium and vitamin D supplementation, weight bearing exercises  Skin cancer:  Hx of skin CA -  NO Discussed atypical lesions   Colorectal cancer:   Colonoscopy is due at age 45 - next month   Discussed concerning signs and sx of CRC, pt denies melena, hematochezia, change in bowel movements or patterns   Lung cancer:   Low Dose CT Chest recommended if Age 35-80 years, 20 pack-year currently smoking OR have quit w/in 15years. Patient does not qualify.    Social History   Tobacco Use  . Smoking status:  Current Every Day Smoker    Packs/day: 1.00    Years: 28.00    Pack years: 28.00    Types: Cigarettes  . Smokeless tobacco: Never Used  Vaping Use  . Vaping Use: Never used  Substance Use Topics  . Alcohol use: Not Currently    Alcohol/week: 0.0 standard drinks  . Drug use: No       [facility] Visit from 04/26/2020 in [facility]  AUDIT-C Score 0      Family History  Problem Relation Age of Onset  . Hypertension Mother   . Hyperlipidemia Mother   . Diabetes Mother   . Arrhythmia Father 73       A-fib  . Alcohol abuse Father   . Heart disease Brother 43       stent x 1   . Alcoholism Brother   . Asthma Son   . Asperger's syndrome Son      Blood pressure/Hypertension: BP Readings from Last 3 Encounters:  04/26/20 128/70  03/10/20 110/76  12/08/19 124/82    Weight/Obesity: Wt Readings from Last 3 Encounters:  04/26/20 176 lb 8 oz (80.1 kg)  03/10/20 183 lb 1.6 oz (83.1 kg)  12/08/19 197 lb 9.6 oz (89.6 kg)   BMI Readings from Last 3 Encounters:  04/26/20 32.28 kg/m  03/10/20 33.49 kg/m  12/08/19 36.14 kg/m     Lipids:  Lab Results  Component Value Date   CHOL 177 03/10/2020   CHOL 212 (H) 11/27/2019   CHOL 143 01/13/2018   Lab Results  Component Value Date   HDL 31 (L) 03/10/2020   HDL 29 (L) 11/27/2019   HDL 30 (L) 01/13/2018   Lab Results  Component Value Date   LDLCALC 121 (H) 03/10/2020   LDLCALC 138 (H) 11/27/2019   LDLCALC 87 01/13/2018   Lab Results  Component Value Date   TRIG 130 03/10/2020   TRIG 315 (H) 11/27/2019   TRIG 155 (H) 01/13/2018   Lab Results  Component Value Date   CHOLHDL 5.7 (H) 03/10/2020   CHOLHDL 7.3 (H) 11/27/2019   CHOLHDL 4.8 01/13/2018   No results found for: LDLDIRECT Based on the results of lipid panel his/her cardiovascular risk factor ( using [place] )  in the next 10 years is: The 10-year ASCVD risk score Mikey Bussing DC Brooke Bonito., et al., 2013) is: 13.8%   Values used to calculate the  score:     Age: 55 years     Sex: Female     Is Non-Hispanic African American: No     Diabetic: Yes     Tobacco smoker: Yes     Systolic Blood Pressure: 426 mmHg     Is BP treated: Yes  HDL Cholesterol: 31 mg/dL     Total Cholesterol: 177 mg/dL Glucose:  Glucose  Date Value Ref Range Status  05/25/2014 106 (H) 65 - 99 mg/dL Final  11/13/2011 112 (H) 65 - 99 mg/dL Final  09/26/2011 172 (H) 65 - 99 mg/dL Final   Glucose, Bld  Date Value Ref Range Status  03/10/2020 110 (H) 65 - 99 mg/dL Final    Comment:    .            Fasting reference interval . For someone without known diabetes, a glucose value between 100 and 125 mg/dL is consistent with prediabetes and should be confirmed with a follow-up test. .   11/27/2019 262 (H) 65 - 99 mg/dL Final    Comment:    .            Fasting reference interval . For someone without known diabetes, a glucose value >125 mg/dL indicates that they may have diabetes and this should be confirmed with a follow-up test. .   02/08/2018 176 (H) 70 - 99 mg/dL Final   Glucose-Capillary  Date Value Ref Range Status  12/07/2015 135 (H) 65 - 99 mg/dL Final  12/07/2015 200 (H) 65 - 99 mg/dL Final   Hypertension: BP Readings from Last 3 Encounters:  04/26/20 128/70  03/10/20 110/76  12/08/19 124/82   Obesity: Wt Readings from Last 3 Encounters:  04/26/20 176 lb 8 oz (80.1 kg)  03/10/20 183 lb 1.6 oz (83.1 kg)  12/08/19 197 lb 9.6 oz (89.6 kg)   BMI Readings from Last 3 Encounters:  04/26/20 32.28 kg/m  03/10/20 33.49 kg/m  12/08/19 36.14 kg/m      Advanced Care Planning:  A voluntary discussion about advance care planning including the explanation and discussion of advance directives.   Social History      She        Social History   Socioeconomic History  . Marital status: Single    Spouse name: Not on file  . Number of children: 4  . Years of education: Not on file  . Highest education level: GED or equivalent   Occupational History    Employer: [employer]  Tobacco Use  . Smoking status: Current Every Day Smoker    Packs/day: 1.00    Years: 28.00    Pack years: 28.00    Types: Cigarettes  . Smokeless tobacco: Never Used  Vaping Use  . Vaping Use: Never used  Substance and Sexual Activity  . Alcohol use: Not Currently    Alcohol/week: 0.0 standard drinks  . Drug use: No  . Sexual activity: Yes    Partners: Male  Other Topics Concern  . Not on file  Social History Narrative  . Not on file   Social Determinants of Health   Financial Resource Strain:   . Difficulty of Paying Living Expenses: Not on file  Food Insecurity:   . Worried About Charity fundraiser in the Last Year: Not on file  . Ran Out of Food in the Last Year: Not on file  Transportation Needs:   . Lack of Transportation (Medical): Not on file  . Lack of Transportation (Non-Medical): Not on file  Physical Activity:   . Days of Exercise per Week: Not on file  . Minutes of Exercise per Session: Not on file  Stress:   . Feeling of Stress : Not on file  Social Connections:   . Frequency of Communication with Friends and Family: Not on file  .  Frequency of Social Gatherings with Friends and Family: Not on file  . Attends Religious Services: Not on file  . Active Member of Clubs or Organizations: Not on file  . Attends Archivist Meetings: Not on file  . Marital Status: Not on file    Family History        Family History  Problem Relation Age of Onset  . Hypertension Mother   . Hyperlipidemia Mother   . Diabetes Mother   . Arrhythmia Father 27       A-fib  . Alcohol abuse Father   . Heart disease Brother 43       stent x 1   . Alcoholism Brother   . Asthma Son   . Asperger's syndrome Son     Patient Active Problem List   Diagnosis Date Noted  . Morbid obesity ([place]) 01/13/2018  . Allergic rhinitis, seasonal 04/23/2017  . Genital herpes 04/23/2017  . Type 2 diabetes mellitus, uncontrolled ([place])  04/23/2017  . 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 Bronchitis 03/16/2016  . Cholesteatoma of attic of right ear 12/05/2015  . Nocturnal cough 11/29/2015  . GERD (gastroesophageal reflux disease) 11/29/2015  . Acid reflux 09/19/2015  . Screening for breast cancer 09/19/2015  . Chronic pelvic pain in female 09/19/2015  . Vitamin D deficiency 12/13/2014  . History of herpes genitalis 12/13/2014  . Hyperlipidemia 05/26/2014  . Smoker 05/26/2014    Past Surgical History:  Procedure Laterality Date  . APPENDECTOMY    . CESAREAN SECTION     x 4   . COLONOSCOPY    . HERNIA REPAIR  09/25/2011  . TUBAL LIGATION    . TYMPANOMASTOIDECTOMY Right 12/07/2015   Procedure: TYMPANOMASTOIDECTOMY;  Surgeon: Clyde Canterbury, MD;  Location: [facility] ORS;  Service: ENT;  Laterality: Right;     Current Outpatient Medications:  .  albuterol (PROVENTIL HFA) 108 (90 Base) MCG/ACT inhaler, Inhale 2 puffs into the lungs every 6 (six) hours as needed for wheezing or shortness of breath., Disp: 18 g, Rfl: 2 .  busPIRone (BUSPAR) 7.5 MG tablet, Take 1 tablet (7.5 mg total) by mouth 3 (three) times daily., Disp: 270 tablet, Rfl: 1 .  dapagliflozin propanediol (FARXIGA) 10 MG TABS tablet, Take 1 tablet (10 mg total) by mouth daily., Disp: 30 tablet, Rfl: 11 .  DULoxetine (CYMBALTA) 30 MG capsule, Take 1 capsule (30 mg total) by mouth 2 (two) times daily., Disp: 180 capsule, Rfl: 3 .  fluticasone (FLONASE) 50 MCG/ACT nasal spray, SPRAY 2 SPRAYS INTO EACH NOSTRIL EVERY DAY, Disp: 48 mL, Rfl: 0 .  lisinopril (ZESTRIL) 20 MG tablet, Take 1 tablet (20 mg total) by mouth daily., Disp: 90 tablet, Rfl: 3 .  metFORMIN (GLUCOPHAGE-XR) 500 MG 24 hr tablet, Take 2 tablets (1,000 mg total) by mouth daily., Disp: 180 tablet, Rfl: 3 .  rosuvastatin (CRESTOR) 20 MG tablet, Take 1 tablet (20 mg total) by mouth daily., Disp: 90 tablet, Rfl: 3 .  sitaGLIPtin (JANUVIA) 100 MG tablet, Take 1 tablet (100 mg  total) by mouth daily., Disp: 90 tablet, Rfl: 3 .  azithromycin (ZITHROMAX Z-PAK) 250 MG tablet, Take 1 tablet (250 mg total) by mouth daily. 566m PO day 1, then 2538mPO days 2-5 (Patient not taking: Reported on 04/26/2020), Disp: 6 tablet, Rfl: 0 .  benzonatate (TESSALON) 100 MG capsule, Take 1 capsule (100 mg total) by mouth 3 (three) times daily as needed for cough. (Patient not taking: Reported on 04/26/2020),  Disp: 30 capsule, Rfl: 0 .  blood glucose meter kit and supplies KIT, Dispense based on patient and insurance preference. Use up to once a day for blood sugar monitoring . (FOR ICD E11.65) (Patient not taking: Reported on 04/15/2020), Disp: 1 each, Rfl: 0 .  famotidine (PEPCID) 20 MG tablet, Take 1 tablet (20 mg total) by mouth at bedtime. (Patient not taking: Reported on 04/26/2020), Disp: 90 tablet, Rfl: 0 .  nitrofurantoin, macrocrystal-monohydrate, (MACROBID) 100 MG capsule, Take 1 capsule (100 mg total) by mouth 2 (two) times daily. (Patient not taking: Reported on 04/15/2020), Disp: 10 capsule, Rfl: 0 .  predniSONE (DELTASONE) 20 MG tablet, 3 tabs poqday 1-3, 2 tabs poqday 4-6, 1 tab poqday 7-9 (Patient not taking: Reported on 04/26/2020), Disp: 18 tablet, Rfl: 0 .  promethazine-dextromethorphan (PROMETHAZINE-DM) 6.25-15 MG/5ML syrup, Take 2.5-5 mLs by mouth 3 (three) times daily as needed for cough. (Patient not taking: Reported on 04/26/2020), Disp: 118 mL, Rfl: 0  No Known Allergies  Patient Care Team: [name], PA-C as PCP - General (Family Medicine) Efrain Sella, MD as Consulting Physician (Gastroenterology) Gabriel Carina, Betsey Holiday, MD as Physician Assistant (Endocrinology)  Review of Systems  10 Systems reviewed and are negative for acute change except as noted in the HPI.  I personally reviewed active problem list, medication list, allergies, family history, social history, health maintenance, notes from last encounter, lab results, imaging with the patient/caregiver  today.        Objective:   Vitals:  Vitals:   04/26/20 1129  BP: 128/70  Pulse: 89  Resp: 16  Temp: 98 F (36.7 C)  TempSrc: Oral  SpO2: 99%  Weight: 176 lb 8 oz (80.1 kg)  Height: _0  (1.575 m)    Body mass index is 32.28 kg/m.  Physical Exam Vitals and nursing note reviewed. Exam conducted with a chaperone present.  Constitutional:      General: She is not in acute distress.    Appearance: Normal appearance. She is well-developed. She is not ill-appearing, toxic-appearing or diaphoretic.  HENT:     Head: Normocephalic and atraumatic.     Right Ear: External ear normal.     Left Ear: External ear normal.     Nose: Nose normal.     Mouth/Throat:     Mouth: Mucous membranes are moist.     Pharynx: Oropharynx is clear. Uvula midline.  Eyes:     General: Lids are normal. No scleral icterus.       Right eye: No discharge.        Left eye: No discharge.     Conjunctiva/sclera: Conjunctivae normal.  Neck:     Trachea: Phonation normal. No tracheal deviation.  Cardiovascular:     Rate and Rhythm: Normal rate and regular rhythm.     Pulses: Normal pulses.          Radial pulses are 2+ on the right side and 2+ on the left side.       Posterior tibial pulses are 2+ on the right side and 2+ on the left side.     Heart sounds: Normal heart sounds. No murmur heard.  No friction rub. No gallop.   Pulmonary:     Effort: Pulmonary effort is normal. No tachypnea, accessory muscle usage, respiratory distress or retractions.     Breath sounds: No stridor or transmitted upper airway sounds. Decreased breath sounds (throughout) present. No wheezing, rhonchi or rales.  Chest:     Chest wall: No  tenderness.     Breasts: Breasts are symmetrical.        Right: Normal.        Left: Normal.  Abdominal:     General: Bowel sounds are normal. There is no distension.     Palpations: Abdomen is soft.     Tenderness: There is no abdominal tenderness. There is no right CVA tenderness,  left CVA tenderness, guarding or rebound.  Genitourinary:    General: Normal vulva.     Vagina: Normal.     Cervix: No cervical motion tenderness, discharge, friability, lesion, erythema, cervical bleeding or eversion.     Uterus: Normal.      Adnexa: Right adnexa normal and left adnexa normal.       Right: No mass, tenderness or fullness.         Left: No mass, tenderness or fullness.       Comments: PAP and HPV done, bimanual normal Some weakening of vaginal walls, no prolapse White moderate amount of vaginal discharge, no erythema Musculoskeletal:        General: No deformity. Normal range of motion.     Cervical back: Normal range of motion and neck supple.  Lymphadenopathy:     Cervical: No cervical adenopathy.  Skin:    General: Skin is warm and dry.     Capillary Refill: Capillary refill takes less than 2 seconds.     Coloration: Skin is not pale.     Findings: No rash.  Neurological:     Mental Status: She is alert. Mental status is at baseline.     Motor: No abnormal muscle tone.     Gait: Gait normal.  Psychiatric:        Mood and Affect: Mood normal.        Speech: Speech normal.        Behavior: Behavior normal.       Fall Risk: Fall Risk  04/26/2020 04/15/2020 03/10/2020 12/08/2019 06/26/2019  Falls in the past year? 0 0 0 1 1  Number falls in past yr: 0 0 0 1 1  Injury with Fall? 0 0 0 0 1  Comment - - - - fell at work and fractured elbow in Aug/Sept  Follow up Falls evaluation completed Falls evaluation completed Falls evaluation completed - -    Functional Status Survey: Is the patient deaf or have difficulty hearing?: Yes Does the patient have difficulty seeing, even when wearing glasses/contacts?: No Does the patient have difficulty concentrating, remembering, or making decisions?: No Does the patient have difficulty walking or climbing stairs?: No Does the patient have difficulty dressing or bathing?: No Does the patient have difficulty doing errands  alone such as visiting a doctor's office or shopping?: No   Assessment & Plan:    CPE completed today  . USPSTF grade A and B recommendations reviewed with patient; age-appropriate recommendations, preventive care, screening tests, etc discussed and encouraged; healthy living encouraged; see AVS for patient education given to patient  . Discussed importance of 150 minutes of physical activity weekly, AHA exercise recommendations given to pt in AVS/handout  . Discussed importance of healthy diet:  eating lean meats and proteins, avoiding trans fats and saturated fats, avoid simple sugars and excessive carbs in diet, eat 6 servings of fruit/vegetables daily and drink plenty of water and avoid sweet beverages.    . Recommended pt to do annual eye exam and routine dental exams/cleanings  . Depression, alcohol, fall screening completed as documented above and  per flowsheets  . Reviewed Health Maintenance: Health Maintenance  Topic Date Due  . MAMMOGRAM  05/27/2015  . PAP SMEAR-Modifier  09/19/2018  . HIV Screening  05/11/2020 (Originally 06/06/1990)  . INFLUENZA VACCINE  09/15/2020 (Originally 01/17/2020)  . Hepatitis C Screening  12/07/2020 (Originally 23-Nov-1975)  . HEMOGLOBIN A1C  09/07/2020  . OPHTHALMOLOGY EXAM  11/30/2020  . FOOT EXAM  12/07/2020  . TETANUS/TDAP  07/01/2021  . PNEUMOCOCCAL POLYSACCHARIDE VACCINE AGE 6-64 HIGH RISK  Completed  . COVID-19 Vaccine  Completed    . Immunizations: Immunization History  Administered Date(s) Administered  . Influenza,inj,Quad PF,6+ Mos 04/02/2014, 03/15/2015, 06/26/2016, 04/23/2017, 02/10/2018, 02/18/2019  . Moderna SARS-COVID-2 Vaccination 12/14/2019, 01/11/2020  . Pneumococcal Polysaccharide-23 12/27/2017  . Td 09/18/2010, 07/02/2011      ICD-10-CM   1. Adult general medical exam  Z00.00 CBC with Differential/Platelet    COMPLETE METABOLIC PANEL WITH GFR    Lipid panel    Hemoglobin A1c    HIV Antibody (routine testing w rflx)     Hepatitis C antibody  2. Screening for malignant neoplasm of cervix  Z12.4 Cytology - PAP    CANCELED: Cytology - PAP  3. Encounter for other screening for malignant neoplasm of breast  Z12.39 MM 3D SCREEN BREAST BILATERAL  4. Screening examination for STD (sexually transmitted disease)  Z11.3 HIV Antibody (routine testing w rflx)    RPR  5. Encounter for hepatitis C screening test for low risk patient  Z11.59 Hepatitis C antibody  6. Screening for HIV without presence of risk factors  Z11.4 HIV Antibody (routine testing w rflx)  7. Acute bronchitis, unspecified organism  J20.9    Symptoms improved but not completely resolved follow-up if not improving in the next month  8. Smoker  F17.200    1 pack/day with more than 28 pack-year history  9. Encounter for smoking cessation counseling  Z71.6   10. History of herpes genitalis  Z86.19    No current active lesions   New sexual partner in the past month - screen for STDs added to PAP today Blood work ordered today and due prior to next f/up OV (end of Dec, beginning of January)  Continue cough, diminished BS in all lung fields, current smoker with recent bronchitis, trial of maintenance inhaler (per insurance coverage) and pt encouraged to f/up if coughing does not return to her baseline in the next month, bronchitis was tx less than 2 weeks ago, CXR was neg, most sx improving, but cough still lingers - normal for acute bronchitis/viral URI - reassured pt, may also be due to smoking, encouraged to cut back , smoking cessation options offered and discussed for more than 5 min today, local resources offered, she is going to work with smoking cessation program with her job.  Trial maintenance inhaler for suspected COPD, f/up in the next 1-2 months with next appt  F/up sooner if any worsening sx  Educated pt on SE of inhalers, how to use, rinse and spit after use    [name], PA-C 04/26/20 11:55 AM  [facility]

## 2020-04-26 NOTE — Progress Notes (Deleted)
Name: Lauren [name]   MRN: [id number]    DOB: Nov 06, 1975   Date:04/26/2020       Progress Note  Chief Complaint  Patient presents with  . Gynecologic Exam     Subjective:   Lauren [name] is a 44 y.o. female, presents to clinic for OV for PAP/HPV and cervical CA screening  Health Maintenance  Topic Date Due  . PAP SMEAR-Modifier  09/19/2018  . HIV Screening  05/11/2020 (Originally 06/06/1990)  . INFLUENZA VACCINE  09/15/2020 (Originally 01/17/2020)  . Hepatitis C Screening  12/07/2020 (Originally [birth year])  . HEMOGLOBIN A1C  09/07/2020  . OPHTHALMOLOGY EXAM  11/30/2020  . FOOT EXAM  12/07/2020  . TETANUS/TDAP  07/01/2021  . PNEUMOCOCCAL POLYSACCHARIDE VACCINE AGE 76-64 HIGH RISK  Completed  . COVID-19 Vaccine  Completed      Current Outpatient Medications:  .  albuterol (PROVENTIL HFA) 108 (90 Base) MCG/ACT inhaler, Inhale 2 puffs into the lungs every 6 (six) hours as needed for wheezing or shortness of breath., Disp: 18 g, Rfl: 2 .  busPIRone (BUSPAR) 7.5 MG tablet, Take 1 tablet (7.5 mg total) by mouth 3 (three) times daily., Disp: 270 tablet, Rfl: 1 .  dapagliflozin propanediol (FARXIGA) 10 MG TABS tablet, Take 1 tablet (10 mg total) by mouth daily., Disp: 30 tablet, Rfl: 11 .  DULoxetine (CYMBALTA) 30 MG capsule, Take 1 capsule (30 mg total) by mouth 2 (two) times daily., Disp: 180 capsule, Rfl: 3 .  fluticasone (FLONASE) 50 MCG/ACT nasal spray, SPRAY 2 SPRAYS INTO EACH NOSTRIL EVERY DAY, Disp: 48 mL, Rfl: 0 .  lisinopril (ZESTRIL) 20 MG tablet, Take 1 tablet (20 mg total) by mouth daily., Disp: 90 tablet, Rfl: 3 .  metFORMIN (GLUCOPHAGE-XR) 500 MG 24 hr tablet, Take 2 tablets (1,000 mg total) by mouth daily., Disp: 180 tablet, Rfl: 3 .  rosuvastatin (CRESTOR) 20 MG tablet, Take 1 tablet (20 mg total) by mouth daily., Disp: 90 tablet, Rfl: 3 .  sitaGLIPtin (JANUVIA) 100 MG tablet, Take 1 tablet (100 mg total) by mouth daily., Disp: 90 tablet, Rfl: 3 .  azithromycin  (ZITHROMAX Z-PAK) 250 MG tablet, Take 1 tablet (250 mg total) by mouth daily. 547m PO day 1, then 2562mPO days 2-5 (Patient not taking: Reported on 04/26/2020), Disp: 6 tablet, Rfl: 0 .  benzonatate (TESSALON) 100 MG capsule, Take 1 capsule (100 mg total) by mouth 3 (three) times daily as needed for cough. (Patient not taking: Reported on 04/26/2020), Disp: 30 capsule, Rfl: 0 .  blood glucose meter kit and supplies KIT, Dispense based on patient and insurance preference. Use up to once a day for blood sugar monitoring . (FOR ICD E11.65) (Patient not taking: Reported on 04/15/2020), Disp: 1 each, Rfl: 0 .  famotidine (PEPCID) 20 MG tablet, Take 1 tablet (20 mg total) by mouth at bedtime. (Patient not taking: Reported on 04/26/2020), Disp: 90 tablet, Rfl: 0 .  nitrofurantoin, macrocrystal-monohydrate, (MACROBID) 100 MG capsule, Take 1 capsule (100 mg total) by mouth 2 (two) times daily. (Patient not taking: Reported on 04/15/2020), Disp: 10 capsule, Rfl: 0 .  predniSONE (DELTASONE) 20 MG tablet, 3 tabs poqday 1-3, 2 tabs poqday 4-6, 1 tab poqday 7-9 (Patient not taking: Reported on 04/26/2020), Disp: 18 tablet, Rfl: 0 .  promethazine-dextromethorphan (PROMETHAZINE-DM) 6.25-15 MG/5ML syrup, Take 2.5-5 mLs by mouth 3 (three) times daily as needed for cough. (Patient not taking: Reported on 04/26/2020), Disp: 118 mL, Rfl: 0  Patient Active Problem List  Diagnosis Date Noted  . Morbid obesity ([place]) 01/13/2018  . Allergic rhinitis, seasonal 04/23/2017  . Genital herpes 04/23/2017  . Type 2 diabetes mellitus, uncontrolled ([place]) 04/23/2017  . 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 Bronchitis 03/16/2016  . Cholesteatoma of attic of right ear 12/05/2015  . Nocturnal cough 11/29/2015  . GERD (gastroesophageal reflux disease) 11/29/2015  . Acid reflux 09/19/2015  . Screening for breast cancer 09/19/2015  . Chronic pelvic pain in female 09/19/2015  . Vitamin D deficiency  12/13/2014  . History of herpes genitalis 12/13/2014  . Hyperlipidemia 05/26/2014  . Smoker 05/26/2014    Past Surgical History:  Procedure Laterality Date  . APPENDECTOMY    . CESAREAN SECTION     x 4   . COLONOSCOPY    . HERNIA REPAIR  09/25/2011  . TUBAL LIGATION    . TYMPANOMASTOIDECTOMY Right 12/07/2015   Procedure: TYMPANOMASTOIDECTOMY;  Surgeon: Clyde Canterbury, MD;  Location: [facility] ORS;  Service: ENT;  Laterality: Right;    Family History  Problem Relation Age of Onset  . Hypertension Mother   . Hyperlipidemia Mother   . Diabetes Mother   . Arrhythmia Father 43       A-fib  . Alcohol abuse Father   . Heart disease Brother 43       stent x 1   . Alcoholism Brother   . Asthma Son   . Asperger's syndrome Son     Social History   Tobacco Use  . Smoking status: Current Every Day Smoker    Packs/day: 1.00    Years: 28.00    Pack years: 28.00    Types: Cigarettes  . Smokeless tobacco: Never Used  Vaping Use  . Vaping Use: Never used  Substance Use Topics  . Alcohol use: Not Currently    Alcohol/week: 0.0 standard drinks  . Drug use: No     No Known Allergies  Health Maintenance  Topic Date Due  . PAP SMEAR-Modifier  09/19/2018  . HIV Screening  05/11/2020 (Originally 06/06/1990)  . INFLUENZA VACCINE  09/15/2020 (Originally 01/17/2020)  . Hepatitis C Screening  12/07/2020 (Originally 08-26-1975)  . HEMOGLOBIN A1C  09/07/2020  . OPHTHALMOLOGY EXAM  11/30/2020  . FOOT EXAM  12/07/2020  . TETANUS/TDAP  07/01/2021  . PNEUMOCOCCAL POLYSACCHARIDE VACCINE AGE 62-64 HIGH RISK  Completed  . COVID-19 Vaccine  Completed    Chart Review Today: ***  Review of Systems   Objective:   Vitals:   04/26/20 1129  BP: 128/70  Pulse: 89  Resp: 16  Temp: 98 F (36.7 C)  TempSrc: Oral  SpO2: 99%  Weight: 176 lb 8 oz (80.1 kg)  Height: 5' 2"  (1.575 m)    Body mass index is 32.28 kg/m.  Physical Exam      Assessment & Plan:   ***  No follow-ups on file.    [name], PA-C 04/26/20 11:53 AM

## 2020-04-26 NOTE — Patient Instructions (Signed)
[facility] [address] ,  [place]  [address] Get Driving Directions Main: [phone]    Preventive Care 40-45 Years Old, Female Preventive care refers to visits with your health care provider and lifestyle choices that can promote health and wellness. This includes:  A yearly physical exam. This may also be called an annual well check.  Regular dental visits and eye exams.  Immunizations.  Screening for certain conditions.  Healthy lifestyle choices, such as eating a healthy diet, getting regular exercise, not using drugs or products that contain nicotine and tobacco, and limiting alcohol use. What can I expect for my preventive care visit? Physical exam Your health care provider will check your:  Height and weight. This may be used to calculate body mass index (BMI), which tells if you are at a healthy weight.  Heart rate and blood pressure.  Skin for abnormal spots. Counseling Your health care provider may ask you questions about your:  Alcohol, tobacco, and drug use.  Emotional well-being.  Home and relationship well-being.  Sexual activity.  Eating habits.  Work and work environment.  Method of birth control.  Menstrual cycle.  Pregnancy history. What immunizations do I need?  Influenza (flu) vaccine  This is recommended every year. Tetanus, diphtheria, and pertussis (Tdap) vaccine  You may need a Td booster every 10 years. Varicella (chickenpox) vaccine  You may need this if you have not been vaccinated. Zoster (shingles) vaccine  You may need this after age 60. Measles, mumps, and rubella (MMR) vaccine  You may need at least one dose of MMR if you were born in 1957 or later. You may also need a second dose. Pneumococcal conjugate (PCV13) vaccine  You may need this if you have certain conditions and were not previously vaccinated. Pneumococcal polysaccharide (PPSV23) vaccine  You may need one  or two doses if you smoke cigarettes or if you have certain conditions. Meningococcal conjugate (MenACWY) vaccine  You may need this if you have certain conditions. Hepatitis A vaccine  You may need this if you have certain conditions or if you travel or work in places where you may be exposed to hepatitis A. Hepatitis B vaccine  You may need this if you have certain conditions or if you travel or work in places where you may be exposed to hepatitis B. Haemophilus influenzae type b (Hib) vaccine  You may need this if you have certain conditions. Human papillomavirus (HPV) vaccine  If recommended by your health care provider, you may need three doses over 6 months. You may receive vaccines as individual doses or as more than one vaccine together in one shot (combination vaccines). Talk with your health care provider about the risks and benefits of combination vaccines. What tests do I need? Blood tests  Lipid and cholesterol levels. These may be checked every 5 years, or more frequently if you are over 50 years old.  Hepatitis C test.  Hepatitis B test. Screening  Lung cancer screening. You may have this screening every year starting at age 55 if you have a 30-pack-year history of smoking and currently smoke or have quit within the past 15 years.  Colorectal cancer screening. All adults should have this screening starting at age 50 and continuing until age 75. Your health care provider may recommend screening at age 45 if you are at increased risk. You will have tests every 1-10 years, depending on your results and the type of screening test.    Diabetes screening. This is done by checking your blood sugar (glucose) after you have not eaten for a while (fasting). You may have this done every 1-3 years.  Mammogram. This may be done every 1-2 years. Talk with your health care provider about when you should start having regular mammograms. This may depend on whether you have a family  history of breast cancer.  BRCA-related cancer screening. This may be done if you have a family history of breast, ovarian, tubal, or peritoneal cancers.  Pelvic exam and Pap test. This may be done every 3 years starting at age 21. Starting at age 30, this may be done every 5 years if you have a Pap test in combination with an HPV test. Other tests  Sexually transmitted disease (STD) testing.  Bone density scan. This is done to screen for osteoporosis. You may have this scan if you are at high risk for osteoporosis. Follow these instructions at home: Eating and drinking  Eat a diet that includes fresh fruits and vegetables, whole grains, lean protein, and low-fat dairy.  Take vitamin and mineral supplements as recommended by your health care provider.  Do not drink alcohol if: ? Your health care provider tells you not to drink. ? You are pregnant, may be pregnant, or are planning to become pregnant.  If you drink alcohol: ? Limit how much you have to 0-1 drink a day. ? Be aware of how much alcohol is in your drink. In the [place] one drink equals one 12 oz bottle of beer (355 mL), one 5 oz glass of wine (148 mL), or one 1 oz glass of hard liquor (44 mL). Lifestyle  Take daily care of your teeth and gums.  Stay active. Exercise for at least 30 minutes on 5 or more days each week.  Do not use any products that contain nicotine or tobacco, such as cigarettes, e-cigarettes, and chewing tobacco. If you need help quitting, ask your health care provider.  If you are sexually active, practice safe sex. Use a condom or other form of birth control (contraception) in order to prevent pregnancy and STIs (sexually transmitted infections).  If told by your health care provider, take low-dose aspirin daily starting at age 50. What's next?  Visit your health care provider once a year for a well check visit.  Ask your health care provider how often you should have your eyes and teeth  checked.  Stay up to date on all vaccines. This information is not intended to replace advice given to you by your health care provider. Make sure you discuss any questions you have with your health care provider. Document Revised: 02/13/2018 Document Reviewed: 02/13/2018 Elsevier Patient Education  2020 [employer]  

## 2020-04-29 LAB — CYTOLOGY - PAP
Chlamydia: NEGATIVE
Comment: NEGATIVE
Comment: NEGATIVE
Comment: NEGATIVE
Comment: NORMAL
Diagnosis: NEGATIVE
High risk HPV: NEGATIVE
Neisseria Gonorrhea: NEGATIVE
Trichomonas: NEGATIVE

## 2020-05-18 DIAGNOSIS — Z419 Encounter for procedure for purposes other than remedying health state, unspecified: Secondary | ICD-10-CM | POA: Diagnosis not present

## 2020-05-27 ENCOUNTER — Other Ambulatory Visit: Payer: Self-pay | Admitting: Family Medicine

## 2020-05-27 DIAGNOSIS — Z8619 Personal history of other infectious and parasitic diseases: Secondary | ICD-10-CM

## 2020-06-07 ENCOUNTER — Ambulatory Visit: Payer: Medicaid Other | Admitting: Family Medicine

## 2020-06-18 DIAGNOSIS — Z419 Encounter for procedure for purposes other than remedying health state, unspecified: Secondary | ICD-10-CM | POA: Diagnosis not present

## 2020-06-27 ENCOUNTER — Other Ambulatory Visit: Payer: Self-pay

## 2020-06-27 ENCOUNTER — Encounter: Payer: Self-pay | Admitting: Family Medicine

## 2020-06-27 ENCOUNTER — Ambulatory Visit: Payer: Medicaid Other | Admitting: Family Medicine

## 2020-06-27 VITALS — BP 128/76 | HR 77 | Temp 98.5°F | Resp 16 | Ht 62.0 in | Wt 181.4 lb

## 2020-06-27 DIAGNOSIS — Z1159 Encounter for screening for other viral diseases: Secondary | ICD-10-CM | POA: Diagnosis not present

## 2020-06-27 DIAGNOSIS — Z1211 Encounter for screening for malignant neoplasm of colon: Secondary | ICD-10-CM

## 2020-06-27 DIAGNOSIS — Z0000 Encounter for general adult medical examination without abnormal findings: Secondary | ICD-10-CM | POA: Diagnosis not present

## 2020-06-27 DIAGNOSIS — Z5181 Encounter for therapeutic drug level monitoring: Secondary | ICD-10-CM | POA: Diagnosis not present

## 2020-06-27 DIAGNOSIS — Z113 Encounter for screening for infections with a predominantly sexual mode of transmission: Secondary | ICD-10-CM | POA: Diagnosis not present

## 2020-06-27 DIAGNOSIS — M25519 Pain in unspecified shoulder: Secondary | ICD-10-CM

## 2020-06-27 DIAGNOSIS — F332 Major depressive disorder, recurrent severe without psychotic features: Secondary | ICD-10-CM

## 2020-06-27 DIAGNOSIS — E782 Mixed hyperlipidemia: Secondary | ICD-10-CM | POA: Diagnosis not present

## 2020-06-27 DIAGNOSIS — E1165 Type 2 diabetes mellitus with hyperglycemia: Secondary | ICD-10-CM | POA: Diagnosis not present

## 2020-06-27 DIAGNOSIS — J449 Chronic obstructive pulmonary disease, unspecified: Secondary | ICD-10-CM

## 2020-06-27 DIAGNOSIS — F419 Anxiety disorder, unspecified: Secondary | ICD-10-CM | POA: Diagnosis not present

## 2020-06-27 DIAGNOSIS — Z794 Long term (current) use of insulin: Secondary | ICD-10-CM

## 2020-06-27 DIAGNOSIS — Z114 Encounter for screening for human immunodeficiency virus [HIV]: Secondary | ICD-10-CM | POA: Diagnosis not present

## 2020-06-27 DIAGNOSIS — R7989 Other specified abnormal findings of blood chemistry: Secondary | ICD-10-CM

## 2020-06-27 DIAGNOSIS — K219 Gastro-esophageal reflux disease without esophagitis: Secondary | ICD-10-CM | POA: Diagnosis not present

## 2020-06-27 DIAGNOSIS — I10 Essential (primary) hypertension: Secondary | ICD-10-CM | POA: Diagnosis not present

## 2020-06-27 DIAGNOSIS — M542 Cervicalgia: Secondary | ICD-10-CM

## 2020-06-27 DIAGNOSIS — F32A Depression, unspecified: Secondary | ICD-10-CM

## 2020-06-27 MED ORDER — TIZANIDINE HCL 4 MG PO TABS: 2.0000 mg | ORAL_TABLET | Freq: Three times a day (TID) | ORAL | 1 refills | Status: DC | PRN

## 2020-06-27 MED ORDER — BUPROPION HCL ER (SR) 150 MG PO TB12: 150.0000 mg | ORAL_TABLET | Freq: Two times a day (BID) | ORAL | 1 refills | Status: DC

## 2020-06-27 NOTE — Patient Instructions (Signed)

## 2020-06-27 NOTE — Progress Notes (Signed)
Name: Lauren [name]   MRN: [id number]    DOB: March 04, 1976   Date:06/27/2020       Progress Note  Chief Complaint  Patient presents with  . Diabetes  . Hyperlipidemia  . Hypertension     Subjective:   Lauren [name] is a 45 y.o. female, presents to clinic for routine f/up  Was asked to complete labs prior to OV today, not done  DM:   Pt managing DM with farxiga and [place] - hx of being uncontrolled but improving with diet efforts  Reports good med compliance Pt has no SE from meds. Denies: Polyuria, polydipsia, vision changes, neuropathy, hypoglycemia Recent pertinent labs: Lab Results  Component Value Date   HGBA1C 8.0 (H) 03/10/2020   HGBA1C 12.3 (H) 12/08/2019   HGBA1C 10.1 (A) 12/27/2017   Standard of care and health maintenance: Foot exam:  UTD DM eye exam:  UTD ACEI/ARB:  Yes  Statin:  yes  Hyperlipidemia: Currently treated with crestor 20 mg daily, pt reports good med compliance Last Lipids: Lab Results  Component Value Date   CHOL 177 03/10/2020   HDL 31 (L) 03/10/2020   LDLCALC 121 (H) 03/10/2020   TRIG 130 03/10/2020   CHOLHDL 5.7 (H) 03/10/2020   - Denies: Chest pain, myalgias, claudication  Hypertension:  Currently managed on lisinopril 20 mg daily Pt reports good med compliance and denies any SE.   Blood pressure today is well controlled. BP Readings from Last 3 Encounters:  06/27/20 128/76  04/26/20 128/70  03/10/20 110/76   Pt denies CP,  exertional sx, LE edema, palpitation, Ha's, visual disturbances, lightheadedness, hypotension, syncope. Dietary efforts for BP?  none  Current smoker - trying wellbutrin, overall SOB a little better   Current smoker, Lauren [name] wanted to try wellbutrin to see if this would help her stop smoking, tried in the past 1ppd, sometimes less per day Cough a little better Using albuterol inhaler about 1-2 x a day - uses more for DOE  Hx of COPD - worse respiratory sx over the past ~6 months, last OV started new  inhaler 1. Adult general medical exam  Z00.00 CBC with Differential/Platelet    COMPLETE METABOLIC PANEL WITH GFR    Lipid panel    Hemoglobin A1c    HIV Antibody (routine testing w rflx)    Hepatitis C antibody  2. Screening for malignant neoplasm of cervix  Z12.4 Cytology - PAP    CANCELED: Cytology - PAP  3. Encounter for other screening for malignant neoplasm of breast  Z12.39 MM 3D SCREEN BREAST BILATERAL  4. Screening examination for STD (sexually transmitted disease)  Z11.3 HIV Antibody (routine testing w rflx)    RPR  5. Encounter for hepatitis C screening test for low risk patient  Z11.59 Hepatitis C antibody  6. Screening for HIV without presence of risk factors  Z11.4 HIV Antibody (routine testing w rflx)  7. Acute bronchitis, unspecified organism  J20.9    Symptoms improved but not completely resolved follow-up if not improving in the next month  8. Smoker  F17.200    1 pack/day with more than 28 pack-year history  9. Encounter for smoking cessation counseling  Z71.6   10. History of herpes genitalis  Z86.19    No current active lesions   New sexual partner in the past month - screen for STDs added to PAP today Blood work ordered today and due prior to next f/up OV (end of Dec, beginning of January)  Continue  cough, diminished BS in all lung fields, current smoker with recent bronchitis, trial of maintenance inhaler (per insurance coverage) and pt encouraged to f/up if coughing does not return to her baseline in the next month, bronchitis was tx less than 2 weeks ago, CXR was neg, most sx improving, but cough still lingers - normal for acute bronchitis/viral URI - reassured pt, may also be due to smoking, encouraged to cut back , smoking cessation options offered and discussed for more than 5 min today, local resources offered, Lauren [name] is going to work with smoking cessation program with her job.  Trial maintenance inhaler for suspected COPD, f/up in the  next 1-2 months with next appt  F/up sooner if any worsening sx  Educated pt on SE of inhalers, how to use, rinse and spit after use    Current Outpatient Medications:  .  albuterol (PROVENTIL HFA) 108 (90 Base) MCG/ACT inhaler, Inhale 2 puffs into the lungs every 6 (six) hours as needed for wheezing or shortness of breath., Disp: 18 g, Rfl: 2 .  busPIRone (BUSPAR) 7.5 MG tablet, Take 1 tablet (7.5 mg total) by mouth 3 (three) times daily., Disp: 270 tablet, Rfl: 1 .  dapagliflozin propanediol (FARXIGA) 10 MG TABS tablet, Take 1 tablet (10 mg total) by mouth daily., Disp: 30 tablet, Rfl: 11 .  DULoxetine (CYMBALTA) 30 MG capsule, Take 1 capsule (30 mg total) by mouth 2 (two) times daily., Disp: 180 capsule, Rfl: 3 .  fluticasone (FLONASE) 50 MCG/ACT nasal spray, SPRAY 2 SPRAYS INTO EACH NOSTRIL EVERY DAY, Disp: 48 mL, Rfl: 0 .  lisinopril (ZESTRIL) 20 MG tablet, Take 1 tablet (20 mg total) by mouth daily., Disp: 90 tablet, Rfl: 3 .  metFORMIN (GLUCOPHAGE-XR) 500 MG 24 hr tablet, Take 2 tablets (1,000 mg total) by mouth daily., Disp: 180 tablet, Rfl: 3 .  rosuvastatin (CRESTOR) 20 MG tablet, Take 1 tablet (20 mg total) by mouth daily., Disp: 90 tablet, Rfl: 3 .  sitaGLIPtin (JANUVIA) 100 MG tablet, Take 1 tablet (100 mg total) by mouth daily., Disp: 90 tablet, Rfl: 3 .  valACYclovir (VALTREX) 1000 MG tablet, TAKE 1 TABLET BY MOUTH EVERY DAY, Disp: 90 tablet, Rfl: 3  Patient Active Problem List   Diagnosis Date Noted  . Morbid obesity ([place]) 01/13/2018  . Allergic rhinitis, seasonal 04/23/2017  . Genital herpes 04/23/2017  . Type 2 diabetes mellitus, uncontrolled ([place]) 04/23/2017  . 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 ([place]) 05/17/2016  . Bronchitis 03/16/2016  . Cholesteatoma of attic of right ear 12/05/2015  . Nocturnal cough 11/29/2015  . GERD (gastroesophageal reflux disease) 11/29/2015  . Acid reflux 09/19/2015  . Screening for breast cancer 09/19/2015  . Chronic  pelvic pain in female 09/19/2015  . Vitamin D deficiency 12/13/2014  . History of herpes genitalis 12/13/2014  . Hyperlipidemia 05/26/2014  . Smoker 05/26/2014    Past Surgical History:  Procedure Laterality Date  . APPENDECTOMY    . CESAREAN SECTION     x 4   . COLONOSCOPY    . HERNIA REPAIR  09/25/2011  . TUBAL LIGATION    . TYMPANOMASTOIDECTOMY Right 12/07/2015   Procedure: TYMPANOMASTOIDECTOMY;  Surgeon: Clyde Canterbury, MD;  Location: [facility] ORS;  Service: ENT;  Laterality: Right;    Family History  Problem Relation Age of Onset  . Hypertension Mother   . Hyperlipidemia Mother   . Diabetes Mother   . Arrhythmia Father 24       A-fib  . Alcohol abuse Father   .  Heart disease Brother 43       stent x 1   . Alcoholism Brother   . Asthma Son   . Asperger's syndrome Son     Social History   Tobacco Use  . Smoking status: Current Every Day Smoker    Packs/day: 1.00    Years: 28.00    Pack years: 28.00    Types: Cigarettes  . Smokeless tobacco: Never Used  Vaping Use  . Vaping Use: Never used  Substance Use Topics  . Alcohol use: Not Currently    Alcohol/week: 0.0 standard drinks  . Drug use: No     No Known Allergies  Health Maintenance  Topic Date Due  . MAMMOGRAM  05/27/2015  . COLONOSCOPY (Pts 45-24yrs Insurance coverage will need to be confirmed)  Never done  . INFLUENZA VACCINE  09/15/2020 (Originally 01/17/2020)  . Hepatitis C Screening  12/07/2020 (Originally 1976-03-31)  . HEMOGLOBIN A1C  09/07/2020  . OPHTHALMOLOGY EXAM  11/30/2020  . FOOT EXAM  12/07/2020  . TETANUS/TDAP  07/01/2021  . PAP SMEAR-Modifier  04/27/2023  . PNEUMOCOCCAL POLYSACCHARIDE VACCINE AGE 58-64 HIGH RISK  Completed  . COVID-19 Vaccine  Completed  . HIV Screening  Completed    Chart Review Today: I personally reviewed active problem list, medication list, allergies, family history, social history, health maintenance, notes from last encounter, lab results, imaging with the  patient/caregiver today.  Review of Systems  Constitutional: Negative.   HENT: Negative.   Eyes: Negative.   Respiratory: Negative.   Cardiovascular: Negative.   Gastrointestinal: Negative.   Endocrine: Negative.   Genitourinary: Negative.   Musculoskeletal: Positive for neck pain and neck stiffness. Negative for arthralgias, back pain and joint swelling.  Skin: Negative.   Allergic/Immunologic: Negative.   Neurological: Negative.   Hematological: Negative.   Psychiatric/Behavioral: Negative.   All other systems reviewed and are negative.   All other systems reviewed and are negative for acute change except as noted in the HPI.  Objective:   Vitals:   06/27/20 0937  BP: 128/76  Pulse: 77  Resp: 16  Temp: 98.5 F (36.9 C)  TempSrc: Oral  SpO2: 94%  Weight: 181 lb 6.4 oz (82.3 kg)  Height: 5\' 2"  (1.575 m)    Body mass index is 33.18 kg/m.  Physical Exam Vitals and nursing note reviewed.  Constitutional:      General: Lauren [name] is not in acute distress.    Appearance: Normal appearance. Lauren [name] is well-developed. Lauren [name] is obese. Lauren [name] is not ill-appearing, toxic-appearing or diaphoretic.     Interventions: Face mask in place.  HENT:     Head: Normocephalic and atraumatic.     Right Ear: External ear normal.     Left Ear: External ear normal.  Eyes:     General: Lids are normal. No scleral icterus.       Right eye: No discharge.        Left eye: No discharge.     Conjunctiva/sclera: Conjunctivae normal.  Neck:     Trachea: Trachea and phonation normal. No tracheal deviation.     Comments: Tense upper neck and back, no midline tenderness, good flexion and extension, rotation a little limited 5/5 grip strength bilaterally  Cardiovascular:     Rate and Rhythm: Normal rate and regular rhythm.     Pulses: Normal pulses.          Radial pulses are 2+ on the right side and 2+ on the left side.  Posterior tibial pulses are 2+ on the right side and 2+ on the left side.      Heart sounds: Normal heart sounds. No murmur heard. No friction rub. No gallop.   Pulmonary:     Effort: Pulmonary effort is normal. No respiratory distress.     Breath sounds: Normal breath sounds. No stridor. No wheezing, rhonchi or rales.  Chest:     Chest wall: No tenderness.  Abdominal:     General: Bowel sounds are normal. There is no distension.     Palpations: Abdomen is soft.  Musculoskeletal:        General: Tenderness present.     Cervical back: No crepitus. No pain with movement, spinous process tenderness or muscular tenderness (tense). Decreased range of motion.     Right lower leg: No edema.     Left lower leg: No edema.  Lymphadenopathy:     Cervical: No cervical adenopathy.  Skin:    General: Skin is warm and dry.     Coloration: Skin is not jaundiced or pale.     Findings: No rash.  Neurological:     Mental Status: Lauren [name] is alert.     Motor: No abnormal muscle tone.     Gait: Gait normal.  Psychiatric:        Mood and Affect: Mood normal.        Speech: Speech normal.        Behavior: Behavior normal.         Assessment & Plan:   Due for colonoscopy- referral placed today  Labs from prior OV will be completed - reprinted for pt     ICD-10-CM   1. Hyperlipemia, mixed  E78.2    Improvement compliance, no myalgia side effects or concerns recheck lipids  2. Type 2 diabetes mellitus with hyperglycemia, with long-term current use of insulin (HCC)  E11.65    Z79.4    Has been uncontrolled last A1c was 8.0, but overall improving continue medications and recheck labs previously ordered  3. Hypertension, unspecified type  I10    Stable, well controlled, blood pressure at goal today continue healthy diet and lifestyle efforts  4. Severe episode of recurrent major depressive disorder, without psychotic features ([place])  F33.2    PHQ-9 reviewed today  5. Elevated LFTs  R79.89    Recheck LFTs, encourage patient to continue avoiding processed foods and continue to  work on controlling blood sugars and cholesterol avoid EtOH  6. Encounter for medication monitoring  Z51.81    Lab previously ordered but not completed reprinted today  7. Anxiety and depression  F41.9    F32.A   8. Chronic obstructive pulmonary disease, unspecified COPD type ([place])  J44.9    Improving respiratory symptoms continue daily maintenance inhaler  9. Gastroesophageal reflux disease, unspecified whether esophagitis present  K21.9    Improved symptoms with her diet changes and weight loss Lauren [name] is not using Zantac or PPI currently  10. Screening for malignant neoplasm of colon  Z12.11 Ambulatory referral to Gastroenterology   no current sx - agrees to GI referral for screening  11. Neck and shoulder pain  M54.2 Ambulatory referral to Orthopedics   M25.519    See below Lauren [name] has had pain for several months, conservative management, stiff on exam refer to Ortho for assessment and PT   Tense upper neck and back, no midline tenderness, good flexion and extension, rotation a little limited 5/5 grip strength bilaterally      No  follow-ups on file.   [name], PA-C 06/27/20 9:57 AM

## 2020-06-29 ENCOUNTER — Other Ambulatory Visit: Payer: Self-pay | Admitting: Family Medicine

## 2020-06-29 LAB — COMPLETE METABOLIC PANEL WITH GFR
AG Ratio: 1.6 (calc) (ref 1.0–2.5)
ALT: 25 U/L (ref 6–29)
AST: 22 U/L (ref 10–35)
Albumin: 3.9 g/dL (ref 3.6–5.1)
Alkaline phosphatase (APISO): 58 U/L (ref 31–125)
BUN: 10 mg/dL (ref 7–25)
CO2: 27 mmol/L (ref 20–32)
Calcium: 9 mg/dL (ref 8.6–10.2)
Chloride: 106 mmol/L (ref 98–110)
Creat: 0.59 mg/dL (ref 0.50–1.10)
GFR, Est African American: 128 mL/min/{1.73_m2} (ref >=60)
GFR, Est Non African American: 111 mL/min/{1.73_m2} (ref >=60)
Globulin: 2.4 g/dL (calc) (ref 1.9–3.7)
Glucose, Bld: 135 mg/dL — ABNORMAL HIGH (ref 65–99)
Potassium: 4 mmol/L (ref 3.5–5.3)
Sodium: 139 mmol/L (ref 135–146)
Total Bilirubin: 0.4 mg/dL (ref 0.2–1.2)
Total Protein: 6.3 g/dL (ref 6.1–8.1)

## 2020-06-29 LAB — LIPID PANEL
Cholesterol: 215 mg/dL — ABNORMAL HIGH (ref <200)
HDL: 37 mg/dL — ABNORMAL LOW (ref >=50)
LDL Cholesterol (Calc): 151 mg/dL (calc) — ABNORMAL HIGH
Non-HDL Cholesterol (Calc): 178 mg/dL (calc) — ABNORMAL HIGH (ref <130)
Total CHOL/HDL Ratio: 5.8 (calc) — ABNORMAL HIGH (ref <5.0)
Triglycerides: 144 mg/dL (ref <150)

## 2020-06-29 LAB — CBC WITH DIFFERENTIAL/PLATELET
Absolute Monocytes: 235 cells/uL (ref 200–950)
Basophils Absolute: 28 cells/uL (ref 0–200)
Basophils Relative: 0.4 %
Eosinophils Absolute: 104 cells/uL (ref 15–500)
Eosinophils Relative: 1.5 %
HCT: 42.1 % (ref 35.0–45.0)
Hemoglobin: 14 g/dL (ref 11.7–15.5)
Lymphs Abs: 1608 cells/uL (ref 850–3900)
MCH: 30.9 pg (ref 27.0–33.0)
MCHC: 33.3 g/dL (ref 32.0–36.0)
MCV: 92.9 fL (ref 80.0–100.0)
MPV: 11.2 fL (ref 7.5–12.5)
Monocytes Relative: 3.4 %
Neutro Abs: 4927 cells/uL (ref 1500–7800)
Neutrophils Relative %: 71.4 %
Platelets: 187 10*3/uL (ref 140–400)
RBC: 4.53 10*6/uL (ref 3.80–5.10)
RDW: 12.9 % (ref 11.0–15.0)
Total Lymphocyte: 23.3 %
WBC: 6.9 10*3/uL (ref 3.8–10.8)

## 2020-06-29 LAB — RPR: RPR Ser Ql: REACTIVE — AB

## 2020-06-29 LAB — RPR TITER: RPR Titer: 1:1 {titer} — ABNORMAL HIGH

## 2020-06-29 LAB — HEMOGLOBIN A1C
Hgb A1c MFr Bld: 7 % of total Hgb — ABNORMAL HIGH (ref <5.7)
Mean Plasma Glucose: 154 mg/dL
eAG (mmol/L): 8.5 mmol/L

## 2020-06-29 LAB — HEPATITIS C ANTIBODY
Hepatitis C Ab: NONREACTIVE
SIGNAL TO CUT-OFF: 0.01 (ref <1.00)

## 2020-06-29 LAB — FLUORESCENT TREPONEMAL AB(FTA)-IGG-BLD: Fluorescent Treponemal ABS: NONREACTIVE

## 2020-06-29 LAB — HIV ANTIBODY (ROUTINE TESTING W REFLEX): HIV 1&2 Ab, 4th Generation: NONREACTIVE

## 2020-06-30 ENCOUNTER — Telehealth (INDEPENDENT_AMBULATORY_CARE_PROVIDER_SITE_OTHER): Payer: Medicaid Other | Admitting: Family Medicine

## 2020-06-30 ENCOUNTER — Other Ambulatory Visit: Payer: Self-pay

## 2020-06-30 ENCOUNTER — Encounter: Payer: Self-pay | Admitting: Family Medicine

## 2020-06-30 DIAGNOSIS — A530 Latent syphilis, unspecified as early or late: Secondary | ICD-10-CM

## 2020-06-30 NOTE — Progress Notes (Addendum)
Name: Lauren [name]   MRN: [id number]    DOB: Oct 25, 1975   Date:06/30/2020       Progress Note  Subjective:    Chief Complaint  Chief Complaint  Patient presents with  . Follow-up    Abnormal labs    I connected with  Lauren [name]  on 06/30/20 at  3:40 PM EST via TELEPHONE call today  and verified that I am speaking with the correct person using two identifiers.  I discussed the limitations of evaluation and management by telemedicine and the availability of in person appointments. The patient expressed understanding and agreed to proceed. Staff also discussed with the patient that there may be a patient responsible charge related to this service. Patient Location: home Provider Location: [facility] Additional Individuals present: none  HPI Pt presents for virtual visit to review recent labs Pt did pelvic exam/PAP and STD screening in Nov, however she deferred blood work and it was only completed earlier this week. Blood work screening included HIV and RPR Her RPR was reactive and resulted 3 days ago, a subsequent RPR titer was 1-1 positive and Fluorescent Treponemal ABS testing was nonreactive. The time of her appointment in November patient had been recently sexually active with 1 new female partner, using condoms, was not having any symptoms.  Patient does have a history of genital herpes has not had any genital lesions or outbreaks.  She denies any painless ulcers or genital lesions she denies any rash to her palms or soles of her feet.  She denies any other known or recent symptoms in the past 1 to 2 months.  She states that her current sexual partner also does not have any symptoms lesions or chancre.  She will ask him if he will get any STD screening done. Patient has no known past exposure or positive syphilis testing or treatment for syphilis.      Patient Active Problem List   Diagnosis Date Noted  . Morbid obesity ([place]) 01/13/2018  . Allergic rhinitis, seasonal 04/23/2017   . Genital herpes 04/23/2017  . Type 2 diabetes mellitus, uncontrolled ([place]) 04/23/2017  . 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 Bronchitis 03/16/2016  . Cholesteatoma of attic of right ear 12/05/2015  . Nocturnal cough 11/29/2015  . GERD (gastroesophageal reflux disease) 11/29/2015  . Acid reflux 09/19/2015  . Screening for breast cancer 09/19/2015  . Chronic pelvic pain in female 09/19/2015  . Vitamin D deficiency 12/13/2014  . History of herpes genitalis 12/13/2014  . Hyperlipidemia 05/26/2014  . Smoker 05/26/2014    Social History   Tobacco Use  . Smoking status: Current Every Day Smoker    Packs/day: 1.00    Years: 28.00    Pack years: 28.00    Types: Cigarettes  . Smokeless tobacco: Never Used  Substance Use Topics  . Alcohol use: Not Currently    Alcohol/week: 0.0 standard drinks     Current Outpatient Medications:  .  albuterol (PROVENTIL HFA) 108 (90 Base) MCG/ACT inhaler, Inhale 2 puffs into the lungs every 6 (six) hours as needed for wheezing or shortness of breath., Disp: 18 g, Rfl: 2 .  buPROPion (WELLBUTRIN SR) 150 MG 12 hr tablet, Take 1 tablet (150 mg total) by mouth 2 (two) times daily. Start with one tablet po in the morning, after 1-2 weeks add second dose in early afternoon, Disp: 180 tablet, Rfl: 1 .  busPIRone (BUSPAR) 7.5 MG tablet, Take 1 tablet (7.5 mg total) by  mouth 3 (three) times daily., Disp: 270 tablet, Rfl: 1 .  dapagliflozin propanediol (FARXIGA) 10 MG TABS tablet, Take 1 tablet (10 mg total) by mouth daily., Disp: 30 tablet, Rfl: 11 .  DULoxetine (CYMBALTA) 30 MG capsule, Take 1 capsule (30 mg total) by mouth 2 (two) times daily., Disp: 180 capsule, Rfl: 3 .  fluticasone (FLONASE) 50 MCG/ACT nasal spray, SPRAY 2 SPRAYS INTO EACH NOSTRIL EVERY DAY, Disp: 48 mL, Rfl: 0 .  lisinopril (ZESTRIL) 20 MG tablet, Take 1 tablet (20 mg total) by mouth daily., Disp: 90 tablet, Rfl: 3 .  metFORMIN (GLUCOPHAGE-XR) 500 MG 24 hr  tablet, Take 2 tablets (1,000 mg total) by mouth daily., Disp: 180 tablet, Rfl: 3 .  rosuvastatin (CRESTOR) 20 MG tablet, Take 1 tablet (20 mg total) by mouth daily., Disp: 90 tablet, Rfl: 3 .  sitaGLIPtin (JANUVIA) 100 MG tablet, Take 1 tablet (100 mg total) by mouth daily., Disp: 90 tablet, Rfl: 3 .  tiZANidine (ZANAFLEX) 4 MG tablet, Take 0.5-1 tablets (2-4 mg total) by mouth every 8 (eight) hours as needed for muscle spasms (muscle tightness)., Disp: 90 tablet, Rfl: 1 .  valACYclovir (VALTREX) 1000 MG tablet, TAKE 1 TABLET BY MOUTH EVERY DAY, Disp: 90 tablet, Rfl: 3  No Known Allergies  I personally reviewed active problem list, medication list, allergies, family history, social history, health maintenance, notes from last encounter, lab results, imaging with the patient/caregiver today.   Review of Systems  Constitutional: Negative.   HENT: Negative.   Eyes: Negative.   Respiratory: Negative.   Cardiovascular: Negative.   Gastrointestinal: Negative.   Endocrine: Negative.   Genitourinary: Negative.  Negative for genital sores, menstrual problem, pelvic pain, vaginal bleeding, vaginal discharge and vaginal pain.  Musculoskeletal: Negative.   Skin: Negative.  Negative for rash.  Allergic/Immunologic: Negative.   Neurological: Negative.   Hematological: Negative.   Psychiatric/Behavioral: Negative.   All other systems reviewed and are negative.     Objective:   Virtual encounter, vitals limited, only able to obtain the following There were no vitals filed for this visit. There is no height or weight on file to calculate BMI. Nursing Note and Vital Signs reviewed.  Physical Exam Patient alert, phonation clear, answering questions appropriately, mood normal PE limited by telephone encounter  No results found for this or any previous visit (from the past 72 hour(s)).  Assessment and Plan:     ICD-10-CM   1. Positive RPR test  A53.0    1-1 positive titer but negative  Fluorescent Treponemal ABS -likely to be biologically false positive, will have partner tested and will follow    Will review labs and algorithm with S.P. and f/up with pt tomorrow - will see if partner is willing to screen or test - likely to be a false positive I did discuss with pt that if her female partner is positive I would suggest they both be treated, if neg likely false neg and we can retest and monitor pt.   -Red flags and when to present for emergency care or RTC  reviewed with patient at time of visit. Follow up and care instructions discussed and provided in AVS. - I discussed the assessment and treatment plan with the patient. The patient was provided an opportunity to ask questions and all were answered. The patient agreed with the plan and demonstrated an understanding of the instructions.  I provided 20+ minutes of non-face-to-face time during this encounter.  [name], PA-C 06/30/20 4:35 PM

## 2020-07-01 ENCOUNTER — Telehealth (INDEPENDENT_AMBULATORY_CARE_PROVIDER_SITE_OTHER): Payer: Self-pay | Admitting: Gastroenterology

## 2020-07-01 ENCOUNTER — Other Ambulatory Visit: Payer: Self-pay

## 2020-07-01 DIAGNOSIS — Z1211 Encounter for screening for malignant neoplasm of colon: Secondary | ICD-10-CM

## 2020-07-01 MED ORDER — NA SULFATE-K SULFATE-MG SULF 17.5-3.13-1.6 GM/177ML PO SOLN: 1.0000 | Freq: Once | ORAL | 0 refills | Status: AC

## 2020-07-01 NOTE — Progress Notes (Signed)
Gastroenterology Pre-Procedure Review  Request Date: Thursday 07/07/20 Requesting Physician: Dr. Bonna Gains  PATIENT REVIEW QUESTIONS: The patient responded to the following health history questions as indicated:     1. Are you having any GI issues? no 2. Do you have a personal history of Polyps? no 3. Do you have a family history of Colon Cancer or Polyps? no 4. Diabetes Mellitus? yes (type 2) 5. Joint replacements in the past 12 months?no 6. Major health problems in the past 3 months?no 7. Any artificial heart valves, MVP, or defibrillator?no    MEDICATIONS & ALLERGIES:    Patient reports the following regarding taking any anticoagulation/antiplatelet therapy:   Plavix, Coumadin, Eliquis, Xarelto, Lovenox, Pradaxa, Brilinta, or Effient? no Aspirin? no  Patient confirms/reports the following medications:  Current Outpatient Medications  Medication Sig Dispense Refill  . albuterol (PROVENTIL HFA) 108 (90 Base) MCG/ACT inhaler Inhale 2 puffs into the lungs every 6 (six) hours as needed for wheezing or shortness of breath. 18 g 2  . buPROPion (WELLBUTRIN SR) 150 MG 12 hr tablet Take 1 tablet (150 mg total) by mouth 2 (two) times daily. Start with one tablet po in the morning, after 1-2 weeks add second dose in early afternoon 180 tablet 1  . busPIRone (BUSPAR) 7.5 MG tablet Take 1 tablet (7.5 mg total) by mouth 3 (three) times daily. 270 tablet 1  . dapagliflozin propanediol (FARXIGA) 10 MG TABS tablet Take 1 tablet (10 mg total) by mouth daily. 30 tablet 11  . DULoxetine (CYMBALTA) 30 MG capsule Take 1 capsule (30 mg total) by mouth 2 (two) times daily. 180 capsule 3  . fluticasone (FLONASE) 50 MCG/ACT nasal spray SPRAY 2 SPRAYS INTO EACH NOSTRIL EVERY DAY 48 mL 0  . lisinopril (ZESTRIL) 20 MG tablet Take 1 tablet (20 mg total) by mouth daily. 90 tablet 3  . metFORMIN (GLUCOPHAGE-XR) 500 MG 24 hr tablet Take 2 tablets (1,000 mg total) by mouth daily. 180 tablet 3  . rosuvastatin  (CRESTOR) 20 MG tablet Take 1 tablet (20 mg total) by mouth daily. 90 tablet 3  . sitaGLIPtin (JANUVIA) 100 MG tablet Take 1 tablet (100 mg total) by mouth daily. 90 tablet 3  . tiZANidine (ZANAFLEX) 4 MG tablet Take 0.5-1 tablets (2-4 mg total) by mouth every 8 (eight) hours as needed for muscle spasms (muscle tightness). 90 tablet 1  . valACYclovir (VALTREX) 1000 MG tablet TAKE 1 TABLET BY MOUTH EVERY DAY 90 tablet 3   No current facility-administered medications for this visit.    Patient confirms/reports the following allergies:  No Known Allergies  No orders of the defined types were placed in this encounter.   AUTHORIZATION INFORMATION Primary Insurance: 1D#: Group #:  Secondary Insurance: 1D#: Group #:  SCHEDULE INFORMATION: Date: 07/07/20 Time: Location:[facility]

## 2020-07-03 ENCOUNTER — Encounter: Payer: Self-pay | Admitting: Family Medicine

## 2020-07-05 ENCOUNTER — Other Ambulatory Visit: Payer: Self-pay

## 2020-07-05 ENCOUNTER — Other Ambulatory Visit
Admission: RE | Admit: 2020-07-05 | Discharge: 2020-07-05 | Disposition: A | Discharge status: Home / Self Care | Payer: Medicaid Other | Source: Ambulatory Visit | Attending: Gastroenterology | Admitting: Gastroenterology

## 2020-07-05 DIAGNOSIS — U071 COVID-19: Secondary | ICD-10-CM | POA: Diagnosis not present

## 2020-07-05 DIAGNOSIS — Z01812 Encounter for preprocedural laboratory examination: Secondary | ICD-10-CM | POA: Diagnosis not present

## 2020-07-05 LAB — SARS CORONAVIRUS 2 (TAT 6-24 HRS): SARS Coronavirus 2: POSITIVE — AB

## 2020-07-06 ENCOUNTER — Telehealth: Payer: Self-pay

## 2020-07-06 ENCOUNTER — Telehealth: Payer: Self-pay | Admitting: Infectious Diseases

## 2020-07-06 ENCOUNTER — Other Ambulatory Visit: Payer: Self-pay | Admitting: Infectious Diseases

## 2020-07-06 MED ORDER — MOLNUPIRAVIR EUA 200MG CAPSULE: 4.0000 | ORAL_CAPSULE | Freq: Two times a day (BID) | ORAL | 0 refills | Status: AC

## 2020-07-06 NOTE — Telephone Encounter (Signed)
LVM for patient to call the office in regards to her lab results.  Thanks,  Sharon, [place]

## 2020-07-06 NOTE — Telephone Encounter (Signed)
Patient was prescribed oral covid treatment molnupiravir and treatment note was reviewed. Medication has been received by [place] and reviewed for appropriateness.  Drug Interactions or Dosage Adjustments Noted: None  Delivery Method: Pick-up  Patient contacted for counseling on 07/06/2020 and verbalized understanding.   Delivery or Pick-Up Date: 07/06/2020   Lauren [name] 07/06/2020, 5:44 PM [facility] Pharmacist Phone# [phone]

## 2020-07-06 NOTE — Telephone Encounter (Signed)
Outpatient Oral COVID Treatment Note  I connected with Lauren [name] on 07/06/2020/10:11 AM by telephone and verified that I am speaking with the correct person using two identifiers.  I discussed the limitations, risks, security, and privacy concerns of performing an evaluation and management service by telephone and the availability of in person appointments. I also discussed with the patient that there may be a patient responsible charge related to this service. The patient expressed understanding and agreed to proceed.  Patient location: [place] residence  Provider location: Provider Home   Diagnosis: COVID-19 infection  Purpose of visit: Discussion of potential use of Molnupiravir or Paxlovid, a new treatment for mild to moderate COVID-19 viral infection in non-hospitalized patients.   Subjective: Patient is a 45 y.o. female who has been diagnosed with COVID 19 viral infection.  Their symptoms began on 1/18 overnight with nasal congestion and runny nose.  Was originally tested prior to colonoscopy and not symptomatic at that time.   Past Medical History:  Diagnosis Date  . Allergic rhinitis   . Anxiety   . Chronic depression   . Diabetes mellitus without complication ([facility])   . Elevated LFTs   . Genital herpes   . GERD (gastroesophageal reflux disease)   . Hyperlipidemia   . Vitamin D deficiency     No Known Allergies   Current Outpatient Medications:  .  albuterol (PROVENTIL HFA) 108 (90 Base) MCG/ACT inhaler, Inhale 2 puffs into the lungs every 6 (six) hours as needed for wheezing or shortness of breath., Disp: 18 g, Rfl: 2 .  buPROPion (WELLBUTRIN SR) 150 MG 12 hr tablet, Take 1 tablet (150 mg total) by mouth 2 (two) times daily. Start with one tablet po in the morning, after 1-2 weeks add second dose in early afternoon, Disp: 180 tablet, Rfl: 1 .  busPIRone (BUSPAR) 7.5 MG tablet, Take 1 tablet (7.5 mg total) by mouth 3 (three) times daily., Disp: 270 tablet, Rfl: 1 .   dapagliflozin propanediol (FARXIGA) 10 MG TABS tablet, Take 1 tablet (10 mg total) by mouth daily., Disp: 30 tablet, Rfl: 11 .  DULoxetine (CYMBALTA) 30 MG capsule, Take 1 capsule (30 mg total) by mouth 2 (two) times daily., Disp: 180 capsule, Rfl: 3 .  fluticasone (FLONASE) 50 MCG/ACT nasal spray, SPRAY 2 SPRAYS INTO EACH NOSTRIL EVERY DAY, Disp: 48 mL, Rfl: 0 .  lisinopril (ZESTRIL) 20 MG tablet, Take 1 tablet (20 mg total) by mouth daily., Disp: 90 tablet, Rfl: 3 .  metFORMIN (GLUCOPHAGE-XR) 500 MG 24 hr tablet, Take 2 tablets (1,000 mg total) by mouth daily., Disp: 180 tablet, Rfl: 3 .  rosuvastatin (CRESTOR) 20 MG tablet, Take 1 tablet (20 mg total) by mouth daily., Disp: 90 tablet, Rfl: 3 .  sitaGLIPtin (JANUVIA) 100 MG tablet, Take 1 tablet (100 mg total) by mouth daily., Disp: 90 tablet, Rfl: 3 .  tiZANidine (ZANAFLEX) 4 MG tablet, Take 0.5-1 tablets (2-4 mg total) by mouth every 8 (eight) hours as needed for muscle spasms (muscle tightness)., Disp: 90 tablet, Rfl: 1 .  valACYclovir (VALTREX) 1000 MG tablet, TAKE 1 TABLET BY MOUTH EVERY DAY, Disp: 90 tablet, Rfl: 3  Objective: Patient appears/sounds congested and mildly ill.  They are in no apparent distress.  Breathing is non labored.  Mood and behavior are normal.  Laboratory Data:  Recent Results (from the past 2160 hour(s))  Cytology - PAP     Status: None   Collection Time: 04/26/20 12:00 PM  Result Value Ref Range  Trichomonas Negative    High risk HPV Negative    Chlamydia Negative    Neisseria Gonorrhea Negative    Adequacy      Satisfactory for evaluation; transformation zone component PRESENT.   Diagnosis      - Negative for intraepithelial lesion or malignancy (NILM)   Comment Normal Reference Range Trichomonas - Negative    Comment Normal Reference Range HPV - Negative    Comment Normal Reference [place] Chlamydia - Negative    Comment      Normal Reference Range Neisseria Gonorrhea - Negative  CBC with  Differential/Platelet     Status: None   Collection Time: 06/27/20 10:29 AM  Result Value Ref Range   WBC 6.9 3.8 - 10.8 Thousand/uL   RBC 4.53 3.80 - 5.10 Million/uL   Hemoglobin 14.0 11.7 - 15.5 g/dL   HCT 42.1 35.0 - 45.0 %   MCV 92.9 80.0 - 100.0 fL   MCH 30.9 27.0 - 33.0 pg   MCHC 33.3 32.0 - 36.0 g/dL   RDW 12.9 11.0 - 15.0 %   Platelets 187 140 - 400 Thousand/uL   MPV 11.2 7.5 - 12.5 fL   Neutro Abs 4,927 1,500 - 7,800 cells/uL   Lymphs Abs 1,608 850 - 3,900 cells/uL   Absolute Monocytes 235 200 - 950 cells/uL   Eosinophils Absolute 104 15 - 500 cells/uL   Basophils Absolute 28 0 - 200 cells/uL   Neutrophils Relative % 71.4 %   Total Lymphocyte 23.3 %   Monocytes Relative 3.4 %   Eosinophils Relative 1.5 %   Basophils Relative 0.4 %  COMPLETE METABOLIC PANEL WITH GFR     Status: Abnormal   Collection Time: 06/27/20 10:29 AM  Result Value Ref Range   Glucose, Bld 135 (H) 65 - 99 mg/dL    Comment: .            Fasting reference interval . For someone without known diabetes, a glucose value >125 mg/dL indicates that they may have diabetes and this should be confirmed with a follow-up test. .    BUN 10 7 - 25 mg/dL   Creat 0.59 0.50 - 1.10 mg/dL   GFR, Est Non African American 111 > OR = 60 mL/min/1.34m2   GFR, Est African American 128 > OR = 60 mL/min/1.69m2   BUN/Creatinine Ratio NOT APPLICABLE 6 - 22 (calc)   Sodium 139 135 - 146 mmol/L   Potassium 4.0 3.5 - 5.3 mmol/L   Chloride 106 98 - 110 mmol/L   CO2 27 20 - 32 mmol/L   Calcium 9.0 8.6 - 10.2 mg/dL   Total Protein 6.3 6.1 - 8.1 g/dL   Albumin 3.9 3.6 - 5.1 g/dL   Globulin 2.4 1.9 - 3.7 g/dL (calc)   AG Ratio 1.6 1.0 - 2.5 (calc)   Total Bilirubin 0.4 0.2 - 1.2 mg/dL   Alkaline phosphatase (APISO) 58 31 - 125 U/L   AST 22 10 - 35 U/L   ALT 25 6 - 29 U/L  Lipid panel     Status: Abnormal   Collection Time: 06/27/20 10:29 AM  Result Value Ref Range   Cholesterol 215 (H) <200 mg/dL   HDL 37 (L) > OR =  50 mg/dL   Triglycerides 144 <150 mg/dL   LDL Cholesterol (Calc) 151 (H) mg/dL (calc)    Comment: Reference range: <100 . Desirable range <100 mg/dL for primary prevention;   <70 mg/dL for patients with CHD or diabetic patients  with >  or = 2 CHD risk factors. Marland Kitchen LDL-C is now calculated using the Martin-Hopkins  calculation, which is a validated novel method providing  better accuracy than the Friedewald equation in the  estimation of LDL-C.  Cresenciano Genre et al. Annamaria Helling. 1610;960(45): 2061-2068  (http://education.[facility].com/faq/FAQ164)    Total CHOL/HDL Ratio 5.8 (H) <5.0 (calc)   Non-HDL Cholesterol (Calc) 178 (H) <130 mg/dL (calc)    Comment: For patients with diabetes plus 1 major ASCVD risk  factor, treating to a non-HDL-C goal of <100 mg/dL  (LDL-C of <70 mg/dL) is considered a therapeutic  option.   Hemoglobin A1c     Status: Abnormal   Collection Time: 06/27/20 10:29 AM  Result Value Ref Range   Hgb A1c MFr Bld 7.0 (H) <5.7 % of total Hgb    Comment: For someone without known diabetes, a hemoglobin A1c value of 6.5% or greater indicates that they may have  diabetes and this should be confirmed with a follow-up  test. . For someone with known diabetes, a value <7% indicates  that their diabetes is well controlled and a value  greater than or equal to 7% indicates suboptimal  control. A1c targets should be individualized based on  duration of diabetes, age, comorbid conditions, and  other considerations. . Currently, no consensus exists regarding use of hemoglobin A1c for diagnosis of diabetes for children. .    Mean Plasma Glucose 154 mg/dL   eAG (mmol/L) 8.5 mmol/L  HIV Antibody (routine testing w rflx)     Status: None   Collection Time: 06/27/20 10:29 AM  Result Value Ref Range   HIV 1&2 Ab, 4th Generation NON-REACTIVE NON-REACTI    Comment: HIV-1 antigen and HIV-1/HIV-2 antibodies were not detected. There is no laboratory evidence of  HIV infection. Marland Kitchen PLEASE NOTE: This information has been disclosed to you from records whose confidentiality may be protected by state law.  If your state requires such protection, then the state law prohibits you from making any further disclosure of the information without the specific written consent of the person to whom it pertains, or as otherwise permitted by law. A general authorization for the release of medical or other information is NOT sufficient for this purpose. . For additional information please refer to http://education.quest[facility].com/faq/FAQ106 (This link is being provided for informational/ educational purposes only.) . Marland Kitchen The performance of this assay has not been clinically validated in patients less than 11 years old. .   Hepatitis C antibody     Status: None   Collection Time: 06/27/20 10:29 AM  Result Value Ref Range   Hepatitis C Ab NON-REACTIVE NON-REACTI   SIGNAL TO CUT-OFF 0.01 <1.00    Comment: . HCV antibody was non-reactive. There is no laboratory  evidence of HCV infection. . In most cases, no further action is required. However, if recent HCV exposure is suspected, a test for HCV RNA (test code [phone]) is suggested. . For additional information please refer to http://education.quest[facility].com/faq/FAQ22v1 (This link is being provided for informational/ educational purposes only.) .   RPR     Status: Abnormal   Collection Time: 06/27/20 10:29 AM  Result Value Ref Range   RPR Ser Ql REACTIVE (A) NON-REACTI  Rpr titer     Status: Abnormal   Collection Time: 06/27/20 10:29 AM  Result Value Ref Range   RPR Titer 1:1 (H)   Fluorescent treponemal ab(fta)-IgG-bld     Status: None   Collection Time: 06/27/20 10:29 AM  Result Value Ref Range   Fluorescent  Treponemal ABS NON-REACTIVE NON-REACTI    Comment: The FTA-ABS is a treponemal assay that is intended to  be used with other tests (e.g. RPR) as part of a  diagnostic algorithm in  the diagnosis of syphilis. Marland Kitchen   SARS CORONAVIRUS 2 (TAT 6-24 HRS) Nasopharyngeal Nasopharyngeal Swab     Status: Abnormal   Collection Time: 07/05/20 11:48 AM   Specimen: Nasopharyngeal Swab  Result Value Ref Range   SARS Coronavirus 2 POSITIVE (A) NEGATIVE    Comment: (NOTE) SARS-CoV-2 target nucleic acids are DETECTED.  The SARS-CoV-2 RNA is generally detectable in upper and lower respiratory specimens during the acute phase of infection. Positive results are indicative of the presence of SARS-CoV-2 RNA. Clinical correlation with patient history and other diagnostic information is  necessary to determine patient infection status. Positive results do not rule out bacterial infection or co-infection with other viruses.  The expected result is Negative.  Fact Sheet for Patients: SugarRoll.be  Fact Sheet for Healthcare Providers: https://www.woods-mathews.com/  This test is not yet approved or cleared by the [place] FDA and  has been authorized for detection and/or diagnosis of SARS-CoV-2 by FDA under an Emergency Use Authorization (EUA). This EUA will remain  in effect (meaning this test can be used) for the duration of the COVID-19 declaration under Section 564(b)(1) of the Act, 21 U. S.C. section 360bbb-3(b)(1), unless the authorization is terminated or revoked sooner.   Performed at [facility], [place] [address], [place], [place] [address]      Assessment: 45 y.o. female with mild/moderate COVID 19 viral infection diagnosed on 07/04/2020 at high risk for progression to severe COVID 19. Has had primary vaccines but no booster yet.   Plan:  This patient is a 45 y.o. female that meets the following criteria for Emergency Use Authorization of: Molnupiravir  1. Age >18 yr 2. SARS-COV-2 positive test 3. Symptom onset < 5 days 4. Mild-to-moderate COVID disease with high risk for severe progression to hospitalization or  death   I have spoken and communicated the following to the patient or parent/caregiver regarding: 1. Molnupiravir is an unapproved drug that is authorized for use under an Print production planner.  2. There are no adequate, approved, available products for the treatment of COVID-19 in adults who have mild-to-moderate COVID-19 and are at high risk for progressing to severe COVID-19, including hospitalization or death. 3. Other therapeutics are currently authorized. For additional information on all products authorized for treatment or prevention of COVID-19, please see TanEmporium.pl.  4. There are benefits and risks of taking this treatment as outlined in the "Fact Sheet for Patients and Caregivers."  5. "Fact Sheet for Patients and Caregivers" was reviewed with patient. A hard copy will be provided to patient from pharmacy prior to the patient receiving treatment. 6. Patients should continue to self-isolate and use infection control measures (e.g., wear mask, isolate, social distance, avoid sharing personal items, clean and disinfect "high touch" surfaces, and frequent handwashing) according to CDC guidelines.  7. The patient or parent/caregiver has the option to accept or refuse treatment. 8. [place] has established a pregnancy surveillance program. 9. Females of childbearing potential should use a reliable method of contraception correctly and consistently, as applicable, for the duration of treatment and for 4 days after the last dose of Molnupiravir. 43. Males of reproductive potential who are sexually active with females of childbearing potential should use a reliable method of contraception correctly and consistently during treatment and for  at least 3 months after the last dose. 11. Pregnancy status and risk was assessed. She has had BTL. Patient verbalized understanding  of precautions.   After reviewing above information with the patient, the patient agrees to receive molnupiravir.  Follow up instructions:    . Take prescription BID x 5 days as directed . Reach out to pharmacist for counseling on medication if desired . For concerns regarding further COVID symptoms please follow up with your PCP or urgent care . For urgent or life-threatening issues, seek care at your local emergency department  The patient was provided an opportunity to ask questions, and all were answered. The patient agreed with the plan and demonstrated an understanding of the instructions.   Script sent to [facility] and opted to pick up RX.  The patient was advised to call their PCP or seek an in-person evaluation if the symptoms worsen or if the condition fails to improve as anticipated.   I provided 11 minutes of non face-to-face telephone visit time during this encounter, and > 50% was spent counseling as documented under my assessment & plan.  Janene Madeira, NP 07/06/2020 /10:11 AM

## 2020-07-19 ENCOUNTER — Other Ambulatory Visit: Payer: Self-pay | Admitting: Family Medicine

## 2020-07-19 DIAGNOSIS — Z419 Encounter for procedure for purposes other than remedying health state, unspecified: Secondary | ICD-10-CM | POA: Diagnosis not present

## 2020-07-27 ENCOUNTER — Ambulatory Visit: Payer: Medicaid Other | Admitting: Anesthesiology

## 2020-07-27 ENCOUNTER — Ambulatory Visit
Admission: RE | Admit: 2020-07-27 | Discharge: 2020-07-27 | Disposition: A | Discharge status: Home / Self Care | Payer: Medicaid Other | Attending: Gastroenterology | Admitting: Gastroenterology

## 2020-07-27 ENCOUNTER — Encounter
Admission: RE | Disposition: A | Discharge status: Home / Self Care | Payer: Self-pay | Source: Home / Self Care | Attending: Gastroenterology

## 2020-07-27 ENCOUNTER — Encounter: Payer: Self-pay | Admitting: Gastroenterology

## 2020-07-27 ENCOUNTER — Other Ambulatory Visit: Payer: Self-pay

## 2020-07-27 DIAGNOSIS — Z833 Family history of diabetes mellitus: Secondary | ICD-10-CM | POA: Insufficient documentation

## 2020-07-27 DIAGNOSIS — Z8349 Family history of other endocrine, nutritional and metabolic diseases: Secondary | ICD-10-CM | POA: Insufficient documentation

## 2020-07-27 DIAGNOSIS — Z6372 Alcoholism and drug addiction in family: Secondary | ICD-10-CM | POA: Diagnosis not present

## 2020-07-27 DIAGNOSIS — Z818 Family history of other mental and behavioral disorders: Secondary | ICD-10-CM | POA: Insufficient documentation

## 2020-07-27 DIAGNOSIS — I10 Essential (primary) hypertension: Secondary | ICD-10-CM | POA: Diagnosis not present

## 2020-07-27 DIAGNOSIS — F17210 Nicotine dependence, cigarettes, uncomplicated: Secondary | ICD-10-CM | POA: Insufficient documentation

## 2020-07-27 DIAGNOSIS — K635 Polyp of colon: Secondary | ICD-10-CM | POA: Diagnosis not present

## 2020-07-27 DIAGNOSIS — E559 Vitamin D deficiency, unspecified: Secondary | ICD-10-CM | POA: Diagnosis not present

## 2020-07-27 DIAGNOSIS — Z8249 Family history of ischemic heart disease and other diseases of the circulatory system: Secondary | ICD-10-CM | POA: Diagnosis not present

## 2020-07-27 DIAGNOSIS — Z1211 Encounter for screening for malignant neoplasm of colon: Secondary | ICD-10-CM | POA: Diagnosis not present

## 2020-07-27 DIAGNOSIS — Z825 Family history of asthma and other chronic lower respiratory diseases: Secondary | ICD-10-CM | POA: Diagnosis not present

## 2020-07-27 DIAGNOSIS — Z79899 Other long term (current) drug therapy: Secondary | ICD-10-CM | POA: Diagnosis not present

## 2020-07-27 DIAGNOSIS — E785 Hyperlipidemia, unspecified: Secondary | ICD-10-CM | POA: Diagnosis not present

## 2020-07-27 DIAGNOSIS — E119 Type 2 diabetes mellitus without complications: Secondary | ICD-10-CM | POA: Diagnosis not present

## 2020-07-27 DIAGNOSIS — K219 Gastro-esophageal reflux disease without esophagitis: Secondary | ICD-10-CM | POA: Diagnosis not present

## 2020-07-27 DIAGNOSIS — K5730 Diverticulosis of large intestine without perforation or abscess without bleeding: Secondary | ICD-10-CM | POA: Insufficient documentation

## 2020-07-27 DIAGNOSIS — Z7984 Long term (current) use of oral hypoglycemic drugs: Secondary | ICD-10-CM | POA: Diagnosis not present

## 2020-07-27 DIAGNOSIS — D123 Benign neoplasm of transverse colon: Secondary | ICD-10-CM | POA: Insufficient documentation

## 2020-07-27 DIAGNOSIS — K648 Other hemorrhoids: Secondary | ICD-10-CM | POA: Diagnosis not present

## 2020-07-27 DIAGNOSIS — K5790 Diverticulosis of intestine, part unspecified, without perforation or abscess without bleeding: Secondary | ICD-10-CM | POA: Diagnosis not present

## 2020-07-27 HISTORY — PX: COLONOSCOPY WITH PROPOFOL: SHX5780

## 2020-07-27 LAB — POCT PREGNANCY, URINE: Preg Test, Ur: NEGATIVE

## 2020-07-27 LAB — GLUCOSE, CAPILLARY: Glucose-Capillary: 155 mg/dL — ABNORMAL HIGH (ref 70–99)

## 2020-07-27 SURGERY — COLONOSCOPY WITH PROPOFOL
Anesthesia: General

## 2020-07-27 MED ORDER — PROPOFOL 500 MG/50ML IV EMUL
INTRAVENOUS | Status: DC | PRN
  Administered 2020-07-27: 140 ug/kg/min via INTRAVENOUS

## 2020-07-27 MED ORDER — LIDOCAINE HCL (CARDIAC) PF 100 MG/5ML IV SOSY
PREFILLED_SYRINGE | INTRAVENOUS | Status: DC | PRN
  Administered 2020-07-27: 60 mg via INTRAVENOUS

## 2020-07-27 MED ORDER — PROPOFOL 10 MG/ML IV BOLUS
INTRAVENOUS | Status: DC | PRN
  Administered 2020-07-27: 80 mg via INTRAVENOUS

## 2020-07-27 MED ORDER — SODIUM CHLORIDE 0.9 % IV SOLN: INTRAVENOUS | Status: DC

## 2020-07-27 MED ORDER — PHENYLEPHRINE HCL (PRESSORS) 10 MG/ML IV SOLN
INTRAVENOUS | Status: DC | PRN
  Administered 2020-07-27: 100 ug via INTRAVENOUS

## 2020-07-27 MED ORDER — EPHEDRINE SULFATE 50 MG/ML IJ SOLN
INTRAMUSCULAR | Status: DC | PRN
  Administered 2020-07-27: 5 mg via INTRAVENOUS

## 2020-07-27 NOTE — Transfer of Care (Signed)
Immediate Anesthesia Transfer of Care Note  Patient: Lauren [name]  Procedure(s) Performed: COLONOSCOPY WITH PROPOFOL (N/A )  Patient Location: PACU  Anesthesia Type:General  Level of Consciousness: awake, alert  and oriented  Airway & Oxygen Therapy: Patient Spontanous Breathing  Post-op Assessment: Report given to RN and Post -op Vital signs reviewed and stable  Post vital signs: Reviewed and stable  Last Vitals:  Vitals Value Taken Time  BP    Temp 36.2 C 07/27/20 1135  Pulse 82 07/27/20 1135  Resp 17 07/27/20 1135  SpO2 91 % 07/27/20 1135  Vitals shown include unvalidated device data.  Last Pain:  Vitals:   07/27/20 1135  TempSrc: Temporal  PainSc:          Complications: No complications documented.

## 2020-07-27 NOTE — Op Note (Signed)
[facility] Patient Name: Lauren [name] Procedure Date: 07/27/2020 10:58 AM MRN: [id number] Account #: [id number] Date of Birth: Dec 29, 1975 Admit Type: Outpatient Age: 45 Room: [facility] END[facility] ROOM 3 Gender: Female Note Status: Finalized Procedure:             Colonoscopy Indications:           Screening for colorectal malignant neoplasm Providers:             Makynzie Dobesh B. Bonna Gains MD, MD Referring MD:          [name] (Referring MD) Medicines:             Monitored Anesthesia Care Complications:         No immediate complications. Procedure:             Pre-Anesthesia Assessment:                        - ASA Grade Assessment: II - A patient with mild                         systemic disease.                        - Prior to the procedure, a History and Physical was                         performed, and patient medications, allergies and                         sensitivities were reviewed. The patient's tolerance                         of previous anesthesia was reviewed.                        - The risks and benefits of the procedure and the                         sedation options and risks were discussed with the                         patient. All questions were answered and informed                         consent was obtained.                        - Patient identification and proposed procedure were                         verified prior to the procedure by the physician, the                         nurse, the anesthesiologist, the anesthetist and the                         technician. The procedure was verified in the                         procedure room.  After obtaining informed consent, the colonoscope was                         passed under direct vision. Throughout the procedure,                         the patient's blood pressure, pulse, and oxygen                         saturations were monitored  continuously. The                         Colonoscope was introduced through the anus and                         advanced to the the cecum, identified by appendiceal                         orifice and ileocecal valve. The colonoscopy was                         performed with ease. The patient tolerated the                         procedure well. The quality of the bowel preparation                         was good. Findings:      The perianal and digital rectal examinations were normal.      A 3 mm polyp was found in the transverse colon. The polyp was sessile.       The polyp was removed with a jumbo cold forceps. Resection and retrieval       were complete.      A 7 mm polyp was found in the sigmoid colon. The polyp was sessile. The       polyp was removed with a cold snare. Resection and retrieval were       complete.      A few diverticula were found in the entire colon.      The exam was otherwise without abnormality.      The rectum, sigmoid colon, descending colon, transverse colon, ascending       colon and cecum appeared normal.      Non-bleeding internal hemorrhoids were found during retroflexion.      No additional abnormalities were found on retroflexion. Impression:            - One 3 mm polyp in the transverse colon, removed with                         a jumbo cold forceps. Resected and retrieved.                        - One 7 mm polyp in the sigmoid colon, removed with a                         cold snare. Resected and retrieved.                        -  Diverticulosis in the entire examined colon.                        - The examination was otherwise normal.                        - The rectum, sigmoid colon, descending colon,                         transverse colon, ascending colon and cecum are normal.                        - Non-bleeding internal hemorrhoids. Recommendation:        - Discharge patient to home (with escort).                        - Advance  diet as tolerated.                        - Continue present medications.                        - Await pathology results.                        - Repeat colonoscopy date to be determined after                         pending pathology results are reviewed.                        - The findings and recommendations were discussed with                         the patient.                        - The findings and recommendations were discussed with                         the patient's family.                        - Return to primary care physician as previously                         scheduled.                        - High fiber diet. Procedure Code(s):     --- Professional ---                        [phone], Colonoscopy, flexible; with removal of                         tumor(s), polyp(s), or other lesion(s) by snare                         technique                        46962, 59, Colonoscopy,  flexible; with biopsy, single                         or multiple Diagnosis Code(s):     --- Professional ---                        Z12.11, Encounter for screening for malignant neoplasm                         of colon                        K63.5, Polyp of colon CPT copyright 2019 American Medical Association. All rights reserved. The codes documented in this report are preliminary and upon coder review may  be revised to meet current compliance requirements.  Vonda Antigua, MD Margretta Sidle B. Bonna Gains MD, MD 07/27/2020 11:37:29 AM This report has been signed electronically. Number of Addenda: 0 Note Initiated On: 07/27/2020 10:58 AM Scope Withdrawal Time: 0 hours 20 minutes 16 seconds  Total Procedure Duration: 0 hours 26 minutes 8 seconds  Estimated Blood Loss:  Estimated blood loss: none.      [facility]

## 2020-07-27 NOTE — Anesthesia Preprocedure Evaluation (Signed)
Anesthesia Evaluation  Patient identified by MRN, date of birth, ID band Patient awake    Reviewed: Allergy & Precautions, H&P , NPO status , Patient's Chart, lab work & pertinent test results  History of Anesthesia Complications Negative for: history of anesthetic complications  Airway Mallampati: III  TM Distance: <3 FB Neck ROM: limited    Dental  (+) Chipped, Poor Dentition, Missing   Pulmonary asthma , Current Smoker and Patient abstained from smoking.,    Pulmonary exam normal        Cardiovascular Exercise Tolerance: Good hypertension, (-) angina(-) Past MI and (-) DOE Normal cardiovascular exam     Neuro/Psych PSYCHIATRIC DISORDERS negative neurological ROS     GI/Hepatic Neg liver ROS, GERD  Medicated and Controlled,  Endo/Other  diabetes, Type 2, Oral Hypoglycemic Agents  Renal/GU negative Renal ROS  negative genitourinary   Musculoskeletal   Abdominal   Peds  Hematology negative hematology ROS (+)   Anesthesia Other Findings Past Medical History: No date: Allergic rhinitis No date: Anxiety No date: Chronic depression No date: Diabetes mellitus without complication ([facility]) No date: Elevated LFTs No date: Genital herpes No date: GERD (gastroesophageal reflux disease) No date: Hyperlipidemia No date: Vitamin D deficiency  Past Surgical History: No date: APPENDECTOMY No date: CESAREAN SECTION     Comment:  x 4  No date: COLONOSCOPY 09/25/2011: HERNIA REPAIR No date: TUBAL LIGATION 12/07/2015: TYMPANOMASTOIDECTOMY; Right     Comment:  Procedure: TYMPANOMASTOIDECTOMY;  Surgeon: Clyde Canterbury,              MD;  Location: [facility] ORS;  Service: ENT;  Laterality:               Right;  BMI    Body Mass Index: 32.06 kg/m      Reproductive/Obstetrics negative OB ROS                             Anesthesia Physical Anesthesia Plan  ASA: III  Anesthesia Plan: General    Post-op Pain Management:    Induction: Intravenous  PONV Risk Score and Plan: Propofol infusion and TIVA  Airway Management Planned: Natural Airway and Nasal Cannula  Additional Equipment:   Intra-op Plan:   Post-operative Plan:   Informed Consent: I have reviewed the patients History and Physical, chart, labs and discussed the procedure including the risks, benefits and alternatives for the proposed anesthesia with the patient or authorized representative who has indicated his/her understanding and acceptance.     Dental Advisory Given  Plan Discussed with: Anesthesiologist, CRNA and Surgeon  Anesthesia Plan Comments: (Patient consented for risks of anesthesia including but not limited to:  - adverse reactions to medications - risk of airway placement if required - damage to eyes, teeth, lips or other oral mucosa - nerve damage due to positioning  - sore throat or hoarseness - Damage to heart, brain, nerves, lungs, other parts of body or loss of life  Patient voiced understanding.)        Anesthesia Quick Evaluation

## 2020-07-27 NOTE — H&P (Signed)
Vonda Antigua, MD [address] [place], [place], [place], [address] [address], [place], [place], [place], [address] Phone: [phone]  Fax: [phone]  Primary Care Physician:  [name], PA-C   Pre-Procedure History & Physical: HPI:  Lauren [name] is a 45 y.o. female is here for a colonoscopy.   Past Medical History:  Diagnosis Date  . Allergic rhinitis   . Anxiety   . Chronic depression   . Diabetes mellitus without complication ([place])   . Elevated LFTs   . Genital herpes   . GERD (gastroesophageal reflux disease)   . Hyperlipidemia   . Vitamin D deficiency     Past Surgical History:  Procedure Laterality Date  . APPENDECTOMY    . CESAREAN SECTION     x 4   . COLONOSCOPY    . HERNIA REPAIR  09/25/2011  . TUBAL LIGATION    . TYMPANOMASTOIDECTOMY Right 12/07/2015   Procedure: TYMPANOMASTOIDECTOMY;  Surgeon: Clyde Canterbury, MD;  Location: [facility] ORS;  Service: ENT;  Laterality: Right;    Prior to Admission medications   Medication Sig Start Date End Date Taking? Authorizing Provider  albuterol (PROVENTIL HFA) 108 (90 Base) MCG/ACT inhaler Inhale 2 puffs into the lungs every 6 (six) hours as needed for wheezing or shortness of breath. 12/08/19  Yes Lucio Edward, Leisa, PA-C  busPIRone (BUSPAR) 7.5 MG tablet Take 1 tablet (7.5 mg total) by mouth 3 (three) times daily. 12/08/19  Yes [name], PA-C  dapagliflozin propanediol (FARXIGA) 10 MG TABS tablet Take 1 tablet (10 mg total) by mouth daily. 12/08/19  Yes [name], PA-C  DULoxetine (CYMBALTA) 30 MG capsule Take 1 capsule (30 mg total) by mouth 2 (two) times daily. 12/08/19  Yes [name], PA-C  lisinopril (ZESTRIL) 20 MG tablet Take 1 tablet (20 mg total) by mouth daily. 12/08/19  Yes [name], PA-C  metFORMIN (GLUCOPHAGE-XR) 500 MG 24 hr tablet Take 2 tablets (1,000 mg total) by mouth daily. 12/08/19  Yes [name], PA-C  rosuvastatin (CRESTOR) 20 MG tablet Take 1 tablet (20 mg total) by mouth daily.  12/08/19  Yes [name], PA-C  sitaGLIPtin (JANUVIA) 100 MG tablet Take 1 tablet (100 mg total) by mouth daily. 12/08/19  Yes [name], PA-C  tiZANidine (ZANAFLEX) 4 MG tablet Take 0.5-1 tablets (2-4 mg total) by mouth every 8 (eight) hours as needed for muscle spasms (muscle tightness). 06/27/20  Yes [name], PA-C  valACYclovir (VALTREX) 1000 MG tablet TAKE 1 TABLET BY MOUTH EVERY DAY 05/30/20  Yes [name], PA-C  buPROPion (WELLBUTRIN SR) 150 MG 12 hr tablet Take 1 tablet (150 mg total) by mouth 2 (two) times daily. Start with one tablet po in the morning, after 1-2 weeks add second dose in early afternoon 06/27/20   [name], PA-C  fluticasone (FLONASE) 50 MCG/ACT nasal spray SPRAY 2 SPRAYS INTO EACH NOSTRIL EVERY DAY 12/28/18   Poulose, Bethel Born, NP  ranitidine (ZANTAC) 150 MG tablet Take 1 tablet (150 mg total) by mouth at bedtime. 09/12/18 12/03/18  Fredderick Severance, NP    Allergies as of 07/01/2020  . (No Known Allergies)    Family History  Problem Relation Age of Onset  . Hypertension Mother   . Hyperlipidemia Mother   . Diabetes Mother   . Arrhythmia Father 40       A-fib  . Alcohol abuse Father   . Heart disease Brother 43       stent x 1   .  Alcoholism Brother   . Asthma Son   . Asperger's syndrome Son     Social History   Socioeconomic History  . Marital status: Single    Spouse name: Not on file  . Number of children: 4  . Years of education: Not on file  . Highest education level: GED or equivalent  Occupational History    Employer: [employer]  Tobacco Use  . Smoking status: Current Every Day Smoker    Packs/day: 1.00    Years: 28.00    Pack years: 28.00    Types: Cigarettes  . Smokeless tobacco: Never Used  Vaping Use  . Vaping Use: Never used  Substance and Sexual Activity  . Alcohol use: Not Currently    Alcohol/week: 0.0 standard drinks  . Drug use: No  . Sexual activity: Yes    Partners: Male  Other Topics Concern  . Not on file   Social History Narrative  . Not on file   Social Determinants of Health   Financial Resource Strain: Not on file  Food Insecurity: Not on file  Transportation Needs: Not on file  Physical Activity: Not on file  Stress: Not on file  Social Connections: Not on file  Intimate Partner Violence: Not on file    Review of Systems: See HPI, otherwise negative ROS  Physical Exam: BP (!) 150/87   Pulse 83   Temp (!) 97.3 F (36.3 C)   Resp 17   Ht 5\' 3"  (1.6 m)   Wt 82.1 kg   SpO2 100%   BMI 32.06 kg/m  General:   Alert,  pleasant and cooperative in NAD Head:  Normocephalic and atraumatic. Neck:  Supple; no masses or thyromegaly. Lungs:  Clear throughout to auscultation, normal respiratory effort.    Heart:  +S1, +S2, Regular rate and rhythm, No edema. Abdomen:  Soft, nontender and nondistended. Normal bowel sounds, without guarding, and without rebound.   Neurologic:  Alert and  oriented x4;  grossly normal neurologically.  Impression/Plan: Lauren [name] is here for a colonoscopy to be performed for average risk screening.  Risks, benefits, limitations, and alternatives regarding  colonoscopy have been reviewed with the patient.  Questions have been answered.  All parties agreeable.   Virgel Manifold, MD  07/27/2020, 10:46 AM

## 2020-07-27 NOTE — Anesthesia Postprocedure Evaluation (Signed)
Anesthesia Post Note  Patient: IRIDIAN READER  Procedure(s) Performed: COLONOSCOPY WITH PROPOFOL (N/A )  Patient location during evaluation: Endoscopy Anesthesia Type: General Level of consciousness: awake and alert Pain management: pain level controlled Vital Signs Assessment: post-procedure vital signs reviewed and stable Respiratory status: spontaneous breathing, nonlabored ventilation, respiratory function stable and patient connected to nasal cannula oxygen Cardiovascular status: blood pressure returned to baseline and stable Postop Assessment: no apparent nausea or vomiting Anesthetic complications: no   No complications documented.   Last Vitals:  Vitals:   07/27/20 1135 07/27/20 1145  BP: (!) 98/56 105/68  Pulse:    Resp:    Temp: (!) 36.2 C   SpO2:      Last Pain:  Vitals:   07/27/20 1155  TempSrc:   PainSc: 0-No pain                 Precious Haws Mylz Yuan

## 2020-07-28 LAB — SURGICAL PATHOLOGY

## 2020-08-01 ENCOUNTER — Encounter: Payer: Self-pay | Admitting: Gastroenterology

## 2020-08-04 ENCOUNTER — Other Ambulatory Visit: Payer: Self-pay | Admitting: Obstetrics and Gynecology

## 2020-08-04 DIAGNOSIS — M5412 Radiculopathy, cervical region: Secondary | ICD-10-CM | POA: Insufficient documentation

## 2020-08-04 NOTE — Patient Instructions (Signed)
  Hi Ms. Ranes, sorry I missed you today.  - as a part of your Medicaid benefit, you are eligible for care management and care coordination services at no cost or copay. I was unable to reach you by phone today but would be happy to help you with your health related needs. Please feel free to call me at [phone].  A member of the Managed Medicaid care management team will reach out to you again over the next 7 days.   Aida Raider RN, BSN [place]  [facility] - Managed Medicaid High Risk [phone].

## 2020-08-04 NOTE — Patient Outreach (Signed)
Care Coordination  08/04/2020  Lauren [name] [birth year] [id number]    Medicaid Managed Care   Unsuccessful Outreach Note  08/04/2020 Name: Lauren [name] MRN: [id number] DOB: Feb 03, 1976  Referred by: [name], PA-C Reason for referral : High Risk Managed Medicaid (Unsuccessful telephone outreach)   An unsuccessful telephone outreach was attempted today. The patient was referred to the case management team for assistance with care management and care coordination.   Follow Up Plan: A member of the Managed Medicaid care management team will reach out to the patient again over the next 7 days.  Aida Raider RN, BSN [place]  [facility] - Managed Medicaid High Risk [phone].

## 2020-08-16 DIAGNOSIS — Z419 Encounter for procedure for purposes other than remedying health state, unspecified: Secondary | ICD-10-CM | POA: Diagnosis not present

## 2020-08-17 DIAGNOSIS — M5412 Radiculopathy, cervical region: Secondary | ICD-10-CM | POA: Diagnosis not present

## 2020-08-23 DIAGNOSIS — M5412 Radiculopathy, cervical region: Secondary | ICD-10-CM | POA: Diagnosis not present

## 2020-08-25 ENCOUNTER — Ambulatory Visit: Payer: Medicaid Other | Admitting: Family Medicine

## 2020-08-25 DIAGNOSIS — Z20822 Contact with and (suspected) exposure to covid-19: Secondary | ICD-10-CM | POA: Diagnosis not present

## 2020-08-31 ENCOUNTER — Ambulatory Visit: Payer: Medicaid Other | Admitting: Family Medicine

## 2020-09-05 ENCOUNTER — Encounter: Payer: Self-pay | Admitting: Family Medicine

## 2020-09-05 ENCOUNTER — Other Ambulatory Visit: Payer: Self-pay

## 2020-09-05 ENCOUNTER — Other Ambulatory Visit: Payer: Self-pay | Admitting: Obstetrics and Gynecology

## 2020-09-05 ENCOUNTER — Ambulatory Visit (INDEPENDENT_AMBULATORY_CARE_PROVIDER_SITE_OTHER): Payer: Managed Care, Other (non HMO) | Admitting: Family Medicine

## 2020-09-05 VITALS — BP 130/80 | HR 103 | Temp 99.1°F | Resp 16 | Ht 62.0 in | Wt 188.7 lb

## 2020-09-05 DIAGNOSIS — I10 Essential (primary) hypertension: Secondary | ICD-10-CM

## 2020-09-05 DIAGNOSIS — J40 Bronchitis, not specified as acute or chronic: Secondary | ICD-10-CM

## 2020-09-05 DIAGNOSIS — F17200 Nicotine dependence, unspecified, uncomplicated: Secondary | ICD-10-CM | POA: Diagnosis not present

## 2020-09-05 MED ORDER — UMECLIDINIUM BROMIDE 62.5 MCG/INH IN AEPB: 1.0000 | INHALATION_SPRAY | Freq: Every day | RESPIRATORY_TRACT | 3 refills | Status: DC

## 2020-09-05 MED ORDER — NICOTINE 14 MG/24HR TD PT24: 14.0000 mg | MEDICATED_PATCH | Freq: Every day | TRANSDERMAL | 3 refills | Status: DC

## 2020-09-05 NOTE — Assessment & Plan Note (Signed)
Improved s/p covid but still with 2x weekly albuterol use. Will trial incruse for presumed COPD. Would likely benefit from PFTs in the future. Tobacco cessation counseling provided.

## 2020-09-05 NOTE — Assessment & Plan Note (Signed)
Cessation counseling provided today. Continue wellbutrin. Rx nicotine patches.

## 2020-09-05 NOTE — Patient Instructions (Signed)
Hi Ms. Pedraza, sorry we missed you today. - as a part of your Medicaid benefit, you are eligible for care management and care coordination services at no cost or copay. I was unable to reach you by phone today but would be happy to help you with your health related needs. Please feel free to call me at [phone].  A member of the Managed Medicaid care management team will reach out to you again over the next 7 days.   Aida Raider RN, BSN [place]  [facility] - Managed Medicaid High Risk [phone].

## 2020-09-05 NOTE — Patient Instructions (Signed)
It was great to see you!  Our plans for today:  - Try the new inhaler for your breathing. Let us know if you have issues affording this at the pharmacy.  - Try the patches to help you quit smoking. Call 1800-QUIT-NOW for help with stopping smoking.  Take care and seek immediate care sooner if you develop any concerns.   Dr. Ky Barban

## 2020-09-05 NOTE — Patient Outreach (Signed)
Care Coordination  09/05/2020  DUSTYN ARMBRISTER [birth year] [id number]    Medicaid Managed Care   Unsuccessful Outreach Note  09/05/2020 Name: Lauren [name] MRN: [id number] DOB: 1976-01-25  Referred by: [name], PA-C Reason for referral : High Risk Managed Medicaid (Unsuccessful telephone outreach)   A second unsuccessful telephone outreach was attempted today. The patient was referred to the case management team for assistance with care management and care coordination.   Follow Up Plan: A member of the Managed Medicaid care management team will reach out to the patient again over the next 7 days.   Aida Raider RN, BSN [place]  [facility] - Managed Medicaid High Risk [phone].

## 2020-09-05 NOTE — Progress Notes (Signed)
    SUBJECTIVE:   CHIEF COMPLAINT / HPI:   Shortness of breath - with cough and diminished BS in all lung fields at prior appt 06/2020, recent bronchitis flare at that time. Subsequently tested positive for COVID, got antibody treatment.  - current tobacco use, presumed COPD - started on albuterol at last appt to see if improvement. - Medications: albuterol prn - Compliance: using albuterol ~2x per week for SOB  Tobacco use - on wellbutrin 150mg  BID, tolerating ok. Previously tried e-cigarettes to quit. Previously worked with smoking cessation program at work, transition. Currently a little over 0.5ppd.  OBJECTIVE:   BP 130/80   Pulse (!) 103   Temp 99.1 F (37.3 C) (Oral)   Resp 16   Ht 5\' 2"  (1.575 m)   Wt 188 lb 11.2 oz (85.6 kg)   SpO2 99%   BMI 34.51 kg/m   Gen: well appearing, in NAD Card: RRR Lungs: diminished lung sounds throughout. No wheezing/rales. Ext: WWP, no edema   ASSESSMENT/PLAN:   Essential hypertension Doing well on current regimen, no changes made today.  Tobacco dependence Cessation counseling provided today. Continue wellbutrin. Rx nicotine patches.  Bronchitis Improved s/p covid but still with 2x weekly albuterol use. Will trial incruse for presumed COPD. Would likely benefit from PFTs in the future. Tobacco cessation counseling provided.    Myles Gip, DO

## 2020-09-05 NOTE — Assessment & Plan Note (Signed)
Doing well on current regimen, no changes made today. 

## 2020-09-14 ENCOUNTER — Telehealth: Payer: Self-pay | Admitting: Family Medicine

## 2020-09-14 NOTE — Telephone Encounter (Signed)
Attempted to reach Lauren [name] today to get her rescheduled for a phone visit with the Managed Medicaid RNCM. I left my contact info on her VM. I will reach out again in the next 7-14 days.

## 2020-09-16 DIAGNOSIS — Z419 Encounter for procedure for purposes other than remedying health state, unspecified: Secondary | ICD-10-CM | POA: Diagnosis not present

## 2020-09-28 ENCOUNTER — Encounter: Payer: Self-pay | Admitting: Family Medicine

## 2020-09-28 ENCOUNTER — Ambulatory Visit (INDEPENDENT_AMBULATORY_CARE_PROVIDER_SITE_OTHER): Payer: Managed Care, Other (non HMO) | Admitting: Family Medicine

## 2020-09-28 ENCOUNTER — Other Ambulatory Visit: Payer: Self-pay

## 2020-09-28 VITALS — BP 128/74 | HR 96 | Temp 98.1°F | Resp 18 | Ht 62.0 in | Wt 190.4 lb

## 2020-09-28 DIAGNOSIS — I10 Essential (primary) hypertension: Secondary | ICD-10-CM | POA: Diagnosis not present

## 2020-09-28 DIAGNOSIS — F419 Anxiety disorder, unspecified: Secondary | ICD-10-CM

## 2020-09-28 DIAGNOSIS — Z5181 Encounter for therapeutic drug level monitoring: Secondary | ICD-10-CM

## 2020-09-28 DIAGNOSIS — J449 Chronic obstructive pulmonary disease, unspecified: Secondary | ICD-10-CM

## 2020-09-28 DIAGNOSIS — F332 Major depressive disorder, recurrent severe without psychotic features: Secondary | ICD-10-CM

## 2020-09-28 DIAGNOSIS — E1165 Type 2 diabetes mellitus with hyperglycemia: Secondary | ICD-10-CM | POA: Diagnosis not present

## 2020-09-28 DIAGNOSIS — M542 Cervicalgia: Secondary | ICD-10-CM | POA: Diagnosis not present

## 2020-09-28 DIAGNOSIS — Z794 Long term (current) use of insulin: Secondary | ICD-10-CM

## 2020-09-28 DIAGNOSIS — Z01818 Encounter for other preprocedural examination: Secondary | ICD-10-CM

## 2020-09-28 DIAGNOSIS — E782 Mixed hyperlipidemia: Secondary | ICD-10-CM | POA: Diagnosis not present

## 2020-09-28 DIAGNOSIS — F17200 Nicotine dependence, unspecified, uncomplicated: Secondary | ICD-10-CM

## 2020-09-28 DIAGNOSIS — F32A Depression, unspecified: Secondary | ICD-10-CM

## 2020-09-28 MED ORDER — KETOROLAC TROMETHAMINE 60 MG/2ML IM SOLN
60.0000 mg | Freq: Once | INTRAMUSCULAR | Status: AC
  Administered 2020-09-28: 60 mg via INTRAMUSCULAR

## 2020-09-28 MED ORDER — DULOXETINE HCL 30 MG PO CPEP: 30.0000 mg | ORAL_CAPSULE | Freq: Two times a day (BID) | ORAL | 3 refills | Status: DC

## 2020-09-28 MED ORDER — TIZANIDINE HCL 4 MG PO TABS: 2.0000 mg | ORAL_TABLET | Freq: Three times a day (TID) | ORAL | 1 refills | Status: DC | PRN

## 2020-09-28 NOTE — Progress Notes (Signed)
Name: Lauren [name]   MRN: [id number]    DOB: 1975/10/24   Date:09/28/2020       Progress Note  Chief Complaint  Patient presents with  . Medical Clearance    Ortho surgery     Subjective:   Lauren [name] is a 45 y.o. female, presents to clinic for preop clearance  Having surgery 10/18/20 for cervical fusion C4/C5, general anesthesia   Paperwork received from [facility] Dr. Kayleen Memos - see scanned into chart Completed today, reviewed med list, prior EKG  Hypertension:  Currently managed on lisinopril 20 mg Blood pressure today is well controlled. BP Readings from Last 3 Encounters:  09/28/20 128/74  09/05/20 130/80  07/27/20 105/68  Pt denies CP, SOB, exertional sx, LE edema, palpitation, Ha's, visual disturbances, lightheadedness, hypotension, syncope.   DM on farxiga, januvia metformin, has been well controlled, last A1C was 7.0, she is worried its uncontrolled, sugars running 89-125  Worse eating less, less active, more pain and stress - paperwork for surgery needs A1C 7.5 or below  COPD, pt not using inhalers, insurance would not cover, last bronchitis was last October, no daily respiratory sx, she is no longer smoking, started wellbutrin and she has quit in preparation for surgery  Anxiety-  Depression screen [facility] 2/9 09/05/2020 06/30/2020 06/27/2020  Decreased Interest 0 0 0  Down, Depressed, Hopeless 0 0 0  PHQ - 2 Score 0 0 0  Altered sleeping - 0 0  Tired, decreased energy - 0 0  Change in appetite - 0 0  Feeling bad or failure about yourself  - 0 0  Trouble concentrating - 0 0  Moving slowly or fidgety/restless - 0 0  Suicidal thoughts - 0 0  PHQ-9 Score - 0 0  Difficult doing work/chores - Not difficult at all Not difficult at all  Some recent data might be hidden   GAD 7 : Generalized Anxiety Score 06/30/2020 06/27/2020 03/10/2020 12/08/2019  Nervous, Anxious, on Edge 0 1 1 1   Control/stop worrying 0 0 1 1  Worry too much - different things 0 0 1 1  Trouble  relaxing 0 0 2 1  Restless 0 0 2 1  Easily annoyed or irritable 0 1 1 2   Afraid - awful might happen 0 1 1 2   Total GAD 7 Score 0 3 9 9   Anxiety Difficulty Not difficult at all Not difficult at all - Somewhat difficult    Screening done but not put into chart - the pt and I discussed her worsening anxiety about surgery, concern that something may go wrong, worry about her DM   Current Outpatient Medications:  .  albuterol (PROVENTIL HFA) 108 (90 Base) MCG/ACT inhaler, Inhale 2 puffs into the lungs every 6 (six) hours as needed for wheezing or shortness of breath., Disp: 18 g, Rfl: 2 .  buPROPion (WELLBUTRIN SR) 150 MG 12 hr tablet, Take 1 tablet (150 mg total) by mouth 2 (two) times daily. Start with one tablet po in the morning, after 1-2 weeks add second dose in early afternoon, Disp: 180 tablet, Rfl: 1 .  busPIRone (BUSPAR) 7.5 MG tablet, Take 1 tablet (7.5 mg total) by mouth 3 (three) times daily., Disp: 270 tablet, Rfl: 1 .  dapagliflozin propanediol (FARXIGA) 10 MG TABS tablet, Take 1 tablet (10 mg total) by mouth daily., Disp: 30 tablet, Rfl: 11 .  DULoxetine (CYMBALTA) 30 MG capsule, Take 1 capsule (30 mg total) by mouth 2 (two) times daily., Disp: 180 capsule,  Rfl: 3 .  fluticasone (FLONASE) 50 MCG/ACT nasal spray, SPRAY 2 SPRAYS INTO EACH NOSTRIL EVERY DAY, Disp: 48 mL, Rfl: 0 .  glipiZIDE (GLUCOTROL) 5 MG tablet, glipizide 5 mg tablet, Disp: , Rfl:  .  lisinopril (ZESTRIL) 20 MG tablet, Take 1 tablet (20 mg total) by mouth daily., Disp: 90 tablet, Rfl: 3 .  meloxicam (MOBIC) 7.5 MG tablet, meloxicam 7.5 mg tablet  TAKE 1 TABLET BY MOUTH EVERY DAY, Disp: , Rfl:  .  metFORMIN (GLUCOPHAGE-XR) 500 MG 24 hr tablet, Take 2 tablets (1,000 mg total) by mouth daily., Disp: 180 tablet, Rfl: 3 .  methylPREDNISolone (MEDROL DOSEPAK) 4 MG TBPK tablet, TAKE 6 TABLETS ON DAY 1 AS DIRECTED ON PACKAGE AND DECREASE BY 1 TAB EACH DAY FOR A TOTAL OF 6 DAYS, Disp: , Rfl:  .  Molnupiravir 200 MG CAPS,  TAKE 4 CAPSULES BY MOUTH BY MOUTH 2 TIMES DAILY FOR 5 DAYS, Disp: 40 capsule, Rfl: 0 .  nicotine (NICODERM CQ - DOSED IN MG/24 HOURS) 14 mg/24hr patch, Place 1 patch (14 mg total) onto the skin daily., Disp: 28 patch, Rfl: 3 .  rosuvastatin (CRESTOR) 20 MG tablet, Take 1 tablet (20 mg total) by mouth daily., Disp: 90 tablet, Rfl: 3 .  sitaGLIPtin (JANUVIA) 100 MG tablet, Take 1 tablet (100 mg total) by mouth daily., Disp: 90 tablet, Rfl: 3 .  tiZANidine (ZANAFLEX) 4 MG tablet, Take 0.5-1 tablets (2-4 mg total) by mouth every 8 (eight) hours as needed for muscle spasms (muscle tightness)., Disp: 90 tablet, Rfl: 1 .  umeclidinium bromide (INCRUSE ELLIPTA) 62.5 MCG/INH AEPB, Inhale 1 puff into the lungs daily., Disp: 30 each, Rfl: 3 .  valACYclovir (VALTREX) 1000 MG tablet, TAKE 1 TABLET BY MOUTH EVERY DAY, Disp: 90 tablet, Rfl: 3  Patient Active Problem List   Diagnosis Date Noted  . Cervical radiculopathy 08/04/2020  . Encounter for screening colonoscopy   . Polyp of transverse colon   . Polyp of sigmoid colon   . Essential hypertension 05/19/2018  . History of vitamin D deficiency 05/18/2018  . Tobacco dependence 05/18/2018  . Morbid obesity ([place]) 01/13/2018  . Allergic rhinitis, seasonal 04/23/2017  . Genital herpes 04/23/2017  . Type 2 diabetes mellitus, uncontrolled ([place]) 04/23/2017  . 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 Bronchitis 03/16/2016  . Cholesteatoma of attic of right ear 12/05/2015  . Nocturnal cough 11/29/2015  . GERD (gastroesophageal reflux disease) 11/29/2015  . Acid reflux 09/19/2015  . Screening for breast cancer 09/19/2015  . Chronic pelvic pain in female 09/19/2015  . Vitamin D deficiency 12/13/2014  . History of herpes genitalis 12/13/2014  . Hyperlipidemia 05/26/2014  . Smoker 05/26/2014    Past Surgical History:  Procedure Laterality Date  . APPENDECTOMY    . CESAREAN SECTION     x 4   . COLONOSCOPY    . COLONOSCOPY  WITH PROPOFOL N/A 07/27/2020   Procedure: COLONOSCOPY WITH PROPOFOL;  Surgeon: Virgel Manifold, MD;  Location: [facility] ENDOSCOPY;  Service: Endoscopy;  Laterality: N/A;  COVID POSITIVE 07/05/2020  . HERNIA REPAIR  09/25/2011  . TUBAL LIGATION    . TYMPANOMASTOIDECTOMY Right 12/07/2015   Procedure: TYMPANOMASTOIDECTOMY;  Surgeon: Clyde Canterbury, MD;  Location: [facility] ORS;  Service: ENT;  Laterality: Right;    Family History  Problem Relation Age of Onset  . Hypertension Mother   . Hyperlipidemia Mother   . Diabetes Mother   . Arrhythmia Father 45  A-fib  . Alcohol abuse Father   . Heart disease Brother 43       stent x 1   . Alcoholism Brother   . Asthma Son   . Asperger's syndrome Son     Social History   Tobacco Use  . Smoking status: Current Every Day Smoker    Packs/day: 1.00    Years: 28.00    Pack years: 28.00    Types: Cigarettes  . Smokeless tobacco: Never Used  Vaping Use  . Vaping Use: Never used  Substance Use Topics  . Alcohol use: Not Currently    Alcohol/week: 0.0 standard drinks  . Drug use: No     No Known Allergies  Health Maintenance  Topic Date Due  . MAMMOGRAM  05/27/2015  . COVID-19 Vaccine (3 - Booster for Moderna series) 07/13/2020  . OPHTHALMOLOGY EXAM  11/30/2020  . FOOT EXAM  12/07/2020  . HEMOGLOBIN A1C  12/25/2020  . INFLUENZA VACCINE  01/16/2021  . TETANUS/TDAP  07/01/2021  . PAP SMEAR-Modifier  04/27/2023  . COLONOSCOPY (Pts 45-104yrs Insurance coverage will need to be confirmed)  07/28/2027  . PNEUMOCOCCAL POLYSACCHARIDE VACCINE AGE 72-64 HIGH RISK  Completed  . Hepatitis C Screening  Completed  . HIV Screening  Completed  . HPV VACCINES  Aged Out    Chart Review Today: I personally reviewed active problem list, medication list, allergies, family history, social history, health maintenance, notes from last encounter, lab results, imaging with the patient/caregiver today.   Review of Systems  10 Systems reviewed and are  negative for acute change except as noted in the HPI.  Objective:   Vitals:   09/28/20 1427  BP: 128/74  Pulse: 96  Resp: 18  Temp: 98.1 F (36.7 C)  TempSrc: Oral  SpO2: 98%  Weight: 190 lb 6.4 oz (86.4 kg)  Height: 5\' 2"  (1.575 m)    Body mass index is 34.82 kg/m.  Physical Exam Vitals and nursing note reviewed.  Constitutional:      General: She is not in acute distress.    Appearance: Normal appearance. She is well-developed. She is obese. She is not ill-appearing, toxic-appearing or diaphoretic.     Interventions: Face mask in place.     Comments: Appears uncomfortable, but well appearing  HENT:     Head: Normocephalic and atraumatic.     Right Ear: External ear normal.     Left Ear: External ear normal.  Eyes:     General: Lids are normal. No scleral icterus.       Right eye: No discharge.        Left eye: No discharge.     Conjunctiva/sclera: Conjunctivae normal.  Neck:     Trachea: Phonation normal. No tracheal deviation.  Cardiovascular:     Rate and Rhythm: Normal rate and regular rhythm.     Pulses: Normal pulses.          Radial pulses are 2+ on the right side and 2+ on the left side.       Posterior tibial pulses are 2+ on the right side and 2+ on the left side.     Heart sounds: Normal heart sounds. No murmur heard. No friction rub. No gallop.   Pulmonary:     Effort: Pulmonary effort is normal. No respiratory distress.     Breath sounds: Normal breath sounds. No stridor. No wheezing, rhonchi or rales.  Chest:     Chest wall: No tenderness.  Abdominal:  General: Bowel sounds are normal. There is no distension.     Palpations: Abdomen is soft.  Musculoskeletal:     Right lower leg: No edema.     Left lower leg: No edema.  Skin:    General: Skin is warm and dry.     Coloration: Skin is not jaundiced or pale.     Findings: No rash.  Neurological:     Mental Status: She is alert.     Motor: No abnormal muscle tone.     Gait: Gait normal.   Psychiatric:        Mood and Affect: Mood normal.        Speech: Speech normal.        Behavior: Behavior normal.     EKG NSR - see scan    Assessment & Plan:     ICD-10-CM   1. Pre-operative clearance  Z01.818 CBC with Differential/Platelet    COMPLETE METABOLIC PANEL WITH GFR    Hemoglobin A1c    Lipid panel    Urinalysis, Routine w reflex microscopic    Albumin    DG Chest 2 View    EKG 12-Lead    MICROSCOPIC MESSAGE  2. Essential hypertension  J88 COMPLETE METABOLIC PANEL WITH GFR   well controlled  3. Hyperlipemia, mixed  C16.6 COMPLETE METABOLIC PANEL WITH GFR    Lipid panel   good med compliance  4. Type 2 diabetes mellitus with hyperglycemia, with long-term current use of insulin (H[facility])  A63.01 COMPLETE METABOLIC PANEL WITH GFR   Z79.4 Hemoglobin A1c   has been well controlled, she feels sugars are higher, recheck today  5. Chronic obstructive pulmonary disease, unspecified COPD type ([place])  J44.9 CBC with Differential/Platelet    COMPLETE METABOLIC PANEL WITH GFR    DG Chest 2 View    EKG 12-Lead   improved baseline sx, not smoking, not using daily inhalers, lungs clear, CXR for preop screening  6. Anxiety and depression  F41.9 DULoxetine (CYMBALTA) 30 MG capsule   F32.A    she is anxious about surgery, phq and gad 7 reviewed, encouraged her to restart cymbalta to help with mood and baseline pain  7. Cervical pain (neck)  M54.2 traMADol (ULTRAM) 50 MG tablet    tiZANidine (ZANAFLEX) 4 MG tablet    DULoxetine (CYMBALTA) 30 MG capsule    ketorolac (TORADOL) injection 60 mg   scheduled for surgery with Dr. Kayleen Memos, exacerbated neck pain while in clinic and doing EKG, offered toradol shot, not on NSAIDs right now  8. Encounter for medication monitoring  Z51.81 CBC with Differential/Platelet    COMPLETE METABOLIC PANEL WITH GFR    Hemoglobin A1c    Lipid panel    MICROSCOPIC MESSAGE   Forms completed as able until labs and CXR result Will be completed, faxed to  specialists and scanning into chart.  Keep next routine f/up appt  [name], PA-C 09/28/20 12:11 PM

## 2020-09-28 NOTE — Patient Instructions (Signed)
We will complete paperwork and send to Dr. Liliana Cline office  Please call and schedule your mammogram when you can before the order expires later this year  Health Maintenance  Topic Date Due  . Mammogram  05/27/2015  . COVID-19 Vaccine (3 - Booster for Moderna series) 07/13/2020  . Eye exam for diabetics  11/30/2020  . Complete foot exam   12/07/2020  . Hemoglobin A1C  12/25/2020  . Flu Shot  01/16/2021  . Tetanus Vaccine  07/01/2021  . Pap Smear  04/27/2023  . Colon Cancer Screening  07/28/2027  . Pneumococcal vaccine  Completed  .  Hepatitis C: One time screening is recommended by Center for Disease Control  (CDC) for  adults born from [birth year] through 1966.   Completed  . HIV Screening  Completed  . HPV Vaccine  Aged Out

## 2020-09-29 ENCOUNTER — Ambulatory Visit
Admission: RE | Admit: 2020-09-29 | Discharge: 2020-09-29 | Disposition: A | Discharge status: Home / Self Care | Payer: Medicaid Other | Source: Ambulatory Visit | Attending: Family Medicine | Admitting: Family Medicine

## 2020-09-29 ENCOUNTER — Ambulatory Visit
Admission: RE | Admit: 2020-09-29 | Discharge: 2020-09-29 | Disposition: A | Discharge status: Home / Self Care | Payer: Medicaid Other | Attending: Family Medicine | Admitting: Family Medicine

## 2020-09-29 ENCOUNTER — Encounter: Payer: Self-pay | Admitting: Family Medicine

## 2020-09-29 DIAGNOSIS — Z01818 Encounter for other preprocedural examination: Secondary | ICD-10-CM | POA: Insufficient documentation

## 2020-09-29 DIAGNOSIS — J449 Chronic obstructive pulmonary disease, unspecified: Secondary | ICD-10-CM | POA: Insufficient documentation

## 2020-09-29 LAB — HEMOGLOBIN A1C
Hgb A1c MFr Bld: 7.2 % of total Hgb — ABNORMAL HIGH (ref <5.7)
Mean Plasma Glucose: 160 mg/dL
eAG (mmol/L): 8.9 mmol/L

## 2020-09-29 LAB — CBC WITH DIFFERENTIAL/PLATELET
Absolute Monocytes: 331 cells/uL (ref 200–950)
Basophils Absolute: 58 cells/uL (ref 0–200)
Basophils Relative: 0.8 %
Eosinophils Absolute: 108 cells/uL (ref 15–500)
Eosinophils Relative: 1.5 %
HCT: 42.5 % (ref 35.0–45.0)
Hemoglobin: 14 g/dL (ref 11.7–15.5)
Lymphs Abs: 2563 cells/uL (ref 850–3900)
MCH: 29.3 pg (ref 27.0–33.0)
MCHC: 32.9 g/dL (ref 32.0–36.0)
MCV: 88.9 fL (ref 80.0–100.0)
MPV: 11.2 fL (ref 7.5–12.5)
Monocytes Relative: 4.6 %
Neutro Abs: 4140 cells/uL (ref 1500–7800)
Neutrophils Relative %: 57.5 %
Platelets: 222 10*3/uL (ref 140–400)
RBC: 4.78 10*6/uL (ref 3.80–5.10)
RDW: 13.9 % (ref 11.0–15.0)
Total Lymphocyte: 35.6 %
WBC: 7.2 10*3/uL (ref 3.8–10.8)

## 2020-09-29 LAB — COMPLETE METABOLIC PANEL WITH GFR
AG Ratio: 1.5 (calc) (ref 1.0–2.5)
ALT: 32 U/L — ABNORMAL HIGH (ref 6–29)
AST: 25 U/L (ref 10–35)
Albumin: 4.4 g/dL (ref 3.6–5.1)
Alkaline phosphatase (APISO): 60 U/L (ref 31–125)
BUN: 9 mg/dL (ref 7–25)
CO2: 28 mmol/L (ref 20–32)
Calcium: 9.7 mg/dL (ref 8.6–10.2)
Chloride: 102 mmol/L (ref 98–110)
Creat: 0.63 mg/dL (ref 0.50–1.10)
GFR, Est African American: 126 mL/min/{1.73_m2} (ref >=60)
GFR, Est Non African American: 108 mL/min/{1.73_m2} (ref >=60)
Globulin: 2.9 g/dL (calc) (ref 1.9–3.7)
Glucose, Bld: 123 mg/dL — ABNORMAL HIGH (ref 65–99)
Potassium: 4.3 mmol/L (ref 3.5–5.3)
Sodium: 136 mmol/L (ref 135–146)
Total Bilirubin: 0.4 mg/dL (ref 0.2–1.2)
Total Protein: 7.3 g/dL (ref 6.1–8.1)

## 2020-09-29 LAB — URINALYSIS, ROUTINE W REFLEX MICROSCOPIC
Bilirubin Urine: NEGATIVE
Glucose, UA: NEGATIVE
Hgb urine dipstick: NEGATIVE
Hyaline Cast: NONE SEEN /LPF
Ketones, ur: NEGATIVE
Nitrite: NEGATIVE
Protein, ur: NEGATIVE
RBC / HPF: NONE SEEN /HPF (ref 0–2)
Specific Gravity, Urine: 1.022 (ref 1.001–1.03)
pH: 5.5 (ref 5.0–8.0)

## 2020-09-29 LAB — LIPID PANEL
Cholesterol: 227 mg/dL — ABNORMAL HIGH (ref <200)
HDL: 43 mg/dL — ABNORMAL LOW (ref >=50)
LDL Cholesterol (Calc): 152 mg/dL (calc) — ABNORMAL HIGH
Non-HDL Cholesterol (Calc): 184 mg/dL (calc) — ABNORMAL HIGH (ref <130)
Total CHOL/HDL Ratio: 5.3 (calc) — ABNORMAL HIGH (ref <5.0)
Triglycerides: 180 mg/dL — ABNORMAL HIGH (ref <150)

## 2020-09-29 LAB — MICROSCOPIC MESSAGE

## 2020-09-29 MED ORDER — CEPHALEXIN 500 MG PO CAPS: 500.0000 mg | ORAL_CAPSULE | Freq: Three times a day (TID) | ORAL | 0 refills | Status: AC

## 2020-09-29 NOTE — Addendum Note (Signed)
Addended by: [name] on: 09/29/2020 03:57 PM   Modules accepted: Orders

## 2020-10-06 ENCOUNTER — Ambulatory Visit: Payer: Managed Care, Other (non HMO) | Admitting: Family Medicine

## 2020-10-16 DIAGNOSIS — Z419 Encounter for procedure for purposes other than remedying health state, unspecified: Secondary | ICD-10-CM | POA: Diagnosis not present

## 2020-10-21 ENCOUNTER — Other Ambulatory Visit: Payer: Self-pay | Admitting: Family Medicine

## 2020-10-21 DIAGNOSIS — M542 Cervicalgia: Secondary | ICD-10-CM

## 2020-10-24 NOTE — Telephone Encounter (Signed)
Pt informed that script has been sent to pharmacy

## 2020-10-25 ENCOUNTER — Other Ambulatory Visit: Payer: Self-pay | Admitting: Obstetrics and Gynecology

## 2020-10-25 NOTE — Patient Outreach (Signed)
Care Coordination  10/25/2020  Lauren [name] 1975/10/20 [id number]    Medicaid Managed Care   Unsuccessful Outreach Note  10/25/2020 Name: Lauren [name] MRN: [id number] DOB: 10-Jan-1976  Referred by: [name], PA-C Reason for referral : High Risk Managed Medicaid (Unsuccessful telephone outreach)   Third unsuccessful telephone outreach was attempted today. The patient was referred to the case management team for assistance with care management and care coordination. The patient's primary care provider has been notified of our unsuccessful attempts to make or maintain contact with the patient. The care management team is pleased to engage with this patient at any time in the future should he/she be interested in assistance from the care management team.   Follow Up Plan: We have been unable to make contact with the patient for follow up. The care management team is available to follow up with the patient after provider conversation with the patient regarding recommendation for care management engagement and subsequent re-referral to the care management team.   Aida Raider RN, BSN [place] Management Coordinator - Managed [facility] High Risk [phone].

## 2020-11-16 DIAGNOSIS — Z419 Encounter for procedure for purposes other than remedying health state, unspecified: Secondary | ICD-10-CM | POA: Diagnosis not present

## 2020-11-19 ENCOUNTER — Other Ambulatory Visit: Payer: Self-pay | Admitting: Family Medicine

## 2020-11-19 DIAGNOSIS — M542 Cervicalgia: Secondary | ICD-10-CM

## 2020-11-19 NOTE — Telephone Encounter (Signed)
Requested medication (s) are due for refill today: yes- if pt to 1.5 tablets every 8 hours  Requested medication (s) are on the active medication list: yes  Last refill:  10/24/20  Future visit scheduled: no  Notes to clinic:  med not delegated to NT to RF   Requested Prescriptions  Pending Prescriptions Disp Refills   tiZANidine (ZANAFLEX) 4 MG tablet [Pharmacy Med Name: TIZANIDINE HCL 4 MG TABLET] 90 tablet 0    Sig: TAKE 1/2 TO 1 TABLET BY MOUTH EVERY 8 (EIGHT) HOURS AS NEEDED FOR MUSCLE SPASMS (MUSCLE TIGHTNESS).      Not Delegated - Cardiovascular:  Alpha-2 Agonists - tizanidine Failed - 11/19/2020 11:30 AM      Failed - This refill cannot be delegated      Passed - Valid encounter within last 6 months    Recent Outpatient Visits           1 month ago Pre-operative clearance   [facility] [name], PA-C   2 months ago Essential hypertension   [place], DO   4 months ago Positive RPR test   [facility] [name], PA-C   4 months ago Hyperlipemia, mixed   [facility] [name], PA-C   6 months ago Adult general medical exam   [facility] [name], [place]

## 2020-11-21 ENCOUNTER — Encounter: Payer: Self-pay | Admitting: Unknown Physician Specialty

## 2020-11-21 ENCOUNTER — Other Ambulatory Visit: Payer: Self-pay

## 2020-11-21 ENCOUNTER — Ambulatory Visit (INDEPENDENT_AMBULATORY_CARE_PROVIDER_SITE_OTHER): Payer: Managed Care, Other (non HMO) | Admitting: Unknown Physician Specialty

## 2020-11-21 VITALS — BP 122/80 | HR 99 | Temp 98.1°F | Resp 16 | Ht 63.0 in | Wt 191.0 lb

## 2020-11-21 DIAGNOSIS — B3731 Acute candidiasis of vulva and vagina: Secondary | ICD-10-CM

## 2020-11-21 DIAGNOSIS — B373 Candidiasis of vulva and vagina: Secondary | ICD-10-CM

## 2020-11-21 DIAGNOSIS — E1165 Type 2 diabetes mellitus with hyperglycemia: Secondary | ICD-10-CM

## 2020-11-21 DIAGNOSIS — Z794 Long term (current) use of insulin: Secondary | ICD-10-CM

## 2020-11-21 DIAGNOSIS — I10 Essential (primary) hypertension: Secondary | ICD-10-CM

## 2020-11-21 DIAGNOSIS — E782 Mixed hyperlipidemia: Secondary | ICD-10-CM

## 2020-11-21 MED ORDER — FLUCONAZOLE 150 MG PO TABS: 150.0000 mg | ORAL_TABLET | Freq: Once | ORAL | 0 refills | Status: AC

## 2020-11-21 MED ORDER — SITAGLIPTIN PHOSPHATE 100 MG PO TABS: 100.0000 mg | ORAL_TABLET | Freq: Every day | ORAL | 3 refills | Status: DC

## 2020-11-21 NOTE — Progress Notes (Signed)
BP 122/80   Pulse 99   Temp 98.1 F (36.7 C)   Resp 16   Ht 5\' 3"  (1.6 m)   Wt 191 lb (86.6 kg)   LMP 10/21/2020   SpO2 97%   BMI 33.83 kg/m    Subjective:    Patient ID: Lauren [name], female    DOB: 12/29/1975, 45 y.o.   MRN: [id number]  HPI: Lauren [name] is a 45 y.o. female  Chief Complaint  Patient presents with  . Follow-up  . Medication Refill   Diabetes: Using medications without difficulties.  Last Hgb A1C was 7.4% No hypoglycemic episodes No hyperglycemic episodes Feet problems None Blood Sugars averaging: About 150 at home eye exam within last year Mo   Hypertension  Using medications without difficulty Average home BPs Not checking   Using medication without problems or lightheadedness No chest pain with exertion or shortness of breath No Edema  Elevated Cholesterol Using medications without problems No Muscle aches  Diet: Works to eliminate sugar Exercise: Just had neck surgery and finding doing things is difficult   Relevant past medical, surgical, family and social history reviewed and updated as indicated. Interim medical history since our last visit reviewed. Allergies and medications reviewed and updated.  Review of Systems  Genitourinary:       Vulvar itching.  Thinks she has a yeast infection    Per HPI unless specifically indicated above     Objective:    BP 122/80   Pulse 99   Temp 98.1 F (36.7 C)   Resp 16   Ht 5\' 3"  (1.6 m)   Wt 191 lb (86.6 kg)   LMP 10/21/2020   SpO2 97%   BMI 33.83 kg/m   Wt Readings from Last 3 Encounters:  11/21/20 191 lb (86.6 kg)  09/28/20 190 lb 6.4 oz (86.4 kg)  09/05/20 188 lb 11.2 oz (85.6 kg)    Physical Exam Constitutional:      General: She is not in acute distress.    Appearance: Normal appearance. She is well-developed.  HENT:     Head: Normocephalic and atraumatic.  Eyes:     General: Lids are normal. No scleral icterus.       Right eye: No discharge.        Left eye: No  discharge.     Conjunctiva/sclera: Conjunctivae normal.  Neck:     Vascular: No carotid bruit or JVD.  Cardiovascular:     Rate and Rhythm: Normal rate and regular rhythm.     Heart sounds: Normal heart sounds.  Pulmonary:     Effort: Pulmonary effort is normal.     Breath sounds: Normal breath sounds.  Abdominal:     Palpations: There is no hepatomegaly or splenomegaly.  Musculoskeletal:        General: Normal range of motion.     Cervical back: Normal range of motion and neck supple.  Skin:    General: Skin is warm and dry.     Coloration: Skin is not pale.     Findings: No rash.  Neurological:     Mental Status: She is alert and oriented to person, place, and time.  Psychiatric:        Behavior: Behavior normal.        Thought Content: Thought content normal.        Judgment: Judgment normal.        Assessment & Plan:   Problem List Items Addressed This Visit  Unprioritized   Essential hypertension    Stable, continue present medications.        Hyperlipidemia    Elevated last visit but restarted her statin after labs      Type 2 diabetes mellitus, uncontrolled (HCC)    Hgb A1C was 7.4 2 months ago.  Due for another next month      Relevant Medications   sitaGLIPtin (JANUVIA) 100 MG tablet    Other Visit Diagnoses    Vaginal yeast infection    -  Primary   ? related to elevated blood sugar vs Januvia.  Rx for Diflucan 150 mg   Relevant Medications   fluconazole (DIFLUCAN) 150 MG tablet   Type 2 diabetes mellitus with hyperglycemia, with long-term current use of insulin (HCC)       Relevant Medications   sitaGLIPtin (JANUVIA) 100 MG tablet       Follow up plan: Return in about 4 weeks (around 12/19/2020).

## 2020-11-21 NOTE — Assessment & Plan Note (Signed)
Stable, continue present medications.   

## 2020-11-21 NOTE — Assessment & Plan Note (Signed)
Hgb A1C was 7.4 2 months ago.  Due for another next month

## 2020-11-21 NOTE — Telephone Encounter (Signed)
Pt has an appt today with cheryl

## 2020-11-21 NOTE — Assessment & Plan Note (Signed)
Elevated last visit but restarted her statin after labs

## 2020-12-02 ENCOUNTER — Other Ambulatory Visit: Payer: Self-pay | Admitting: Family Medicine

## 2020-12-02 DIAGNOSIS — E1165 Type 2 diabetes mellitus with hyperglycemia: Secondary | ICD-10-CM

## 2020-12-02 DIAGNOSIS — Z794 Long term (current) use of insulin: Secondary | ICD-10-CM

## 2020-12-16 DIAGNOSIS — Z419 Encounter for procedure for purposes other than remedying health state, unspecified: Secondary | ICD-10-CM | POA: Diagnosis not present

## 2020-12-20 ENCOUNTER — Ambulatory Visit: Payer: Managed Care, Other (non HMO) | Admitting: Unknown Physician Specialty

## 2021-01-03 ENCOUNTER — Ambulatory Visit: Payer: Managed Care, Other (non HMO) | Admitting: Unknown Physician Specialty

## 2021-01-12 ENCOUNTER — Ambulatory Visit: Payer: Managed Care, Other (non HMO) | Admitting: Unknown Physician Specialty

## 2021-01-16 DIAGNOSIS — Z419 Encounter for procedure for purposes other than remedying health state, unspecified: Secondary | ICD-10-CM | POA: Diagnosis not present

## 2021-01-23 ENCOUNTER — Telehealth: Payer: Managed Care, Other (non HMO) | Admitting: Physician Assistant

## 2021-01-23 DIAGNOSIS — J0140 Acute pansinusitis, unspecified: Secondary | ICD-10-CM

## 2021-01-24 MED ORDER — DOXYCYCLINE HYCLATE 100 MG PO CAPS: 100.0000 mg | ORAL_CAPSULE | Freq: Two times a day (BID) | ORAL | 0 refills | Status: DC

## 2021-01-24 NOTE — Progress Notes (Signed)
I have spent 5 minutes in review of e-visit questionnaire, review and updating patient chart, medical decision making and response to patient.   Cambri Plourde Cody Darril Patriarca, PA-C    

## 2021-01-24 NOTE — Progress Notes (Signed)
E-Visit for Sinus Problems  We are sorry that you are not feeling well.  Here is how we plan to help!  Based on what you have shared with me it looks like you have sinusitis.  Sinusitis is inflammation and infection in the sinus cavities of the head.  Based on your presentation I believe you most likely have Acute Bacterial Sinusitis. This is an infection caused by bacteria and is treated with antibiotics. I have prescribed Doxycycline '100mg'$  by mouth twice a day for 10 days. You may use an oral decongestant such as Mucinex D or if you have glaucoma or high blood pressure use plain Mucinex. Saline nasal spray help and can safely be used as often as needed for congestion.    Just to be cautious giving high rates of COVID currently, I do recommend at least a home COVID test to be cautious. You should quarantine until you get this done and get results.   If you develop worsening sinus pain, fever or notice severe headache and vision changes, or if symptoms are not better after completion of antibiotic, please schedule an appointment with a health care provider.    Sinus infections are not as easily transmitted as other respiratory infection, however we still recommend that you avoid close contact with loved ones, especially the very young and elderly.  Remember to wash your hands thoroughly throughout the day as this is the number one way to prevent the spread of infection!  Home Care: Only take medications as instructed by your medical team. Complete the entire course of an antibiotic. Do not take these medications with alcohol. A steam or ultrasonic humidifier can help congestion.  You can place a towel over your head and breathe in the steam from hot water coming from a faucet. Avoid close contacts especially the very young and the elderly. Cover your mouth when you cough or sneeze. Always remember to wash your hands.  Get Help Right Away If: You develop worsening fever or sinus pain. You  develop a severe head ache or visual changes. Your symptoms persist after you have completed your treatment plan.  Make sure you Understand these instructions. Will watch your condition. Will get help right away if you are not doing well or get worse.  Thank you for choosing an e-visit.  Your e-visit answers were reviewed by a board certified advanced clinical practitioner to complete your personal care plan. Depending upon the condition, your plan could have included both over the counter or prescription medications.  Please review your pharmacy choice. Make sure the pharmacy is open so you can pick up prescription now. If there is a problem, you may contact your provider through [employer] and have the prescription routed to another pharmacy.  Your safety is important to [place]. If you have drug allergies check your prescription carefully.   For the next 24 hours you can use MyChart to ask questions about today's visit, request a non-urgent call back, or ask for a work or school excuse. You will get an email in the next two days asking about your experience. I hope that your e-visit has been valuable and will speed your recovery.

## 2021-01-26 ENCOUNTER — Ambulatory Visit: Payer: Managed Care, Other (non HMO) | Admitting: Unknown Physician Specialty

## 2021-01-26 ENCOUNTER — Other Ambulatory Visit: Payer: Self-pay | Admitting: Orthopedic Surgery

## 2021-01-26 DIAGNOSIS — M5412 Radiculopathy, cervical region: Secondary | ICD-10-CM

## 2021-02-02 ENCOUNTER — Ambulatory Visit: Payer: Managed Care, Other (non HMO) | Admitting: Unknown Physician Specialty

## 2021-02-04 ENCOUNTER — Ambulatory Visit
Admission: RE | Admit: 2021-02-04 | Discharge: 2021-02-04 | Disposition: A | Discharge status: Home / Self Care | Payer: Managed Care, Other (non HMO) | Source: Ambulatory Visit | Attending: Orthopedic Surgery | Admitting: Orthopedic Surgery

## 2021-02-04 DIAGNOSIS — M5412 Radiculopathy, cervical region: Secondary | ICD-10-CM

## 2021-02-09 ENCOUNTER — Encounter: Payer: Self-pay | Admitting: Unknown Physician Specialty

## 2021-02-09 ENCOUNTER — Ambulatory Visit (INDEPENDENT_AMBULATORY_CARE_PROVIDER_SITE_OTHER): Payer: Managed Care, Other (non HMO) | Admitting: Unknown Physician Specialty

## 2021-02-09 ENCOUNTER — Other Ambulatory Visit: Payer: Self-pay

## 2021-02-09 VITALS — BP 128/72 | HR 98 | Temp 98.7°F | Resp 16 | Ht 63.0 in | Wt 190.0 lb

## 2021-02-09 DIAGNOSIS — I10 Essential (primary) hypertension: Secondary | ICD-10-CM | POA: Diagnosis not present

## 2021-02-09 DIAGNOSIS — E782 Mixed hyperlipidemia: Secondary | ICD-10-CM | POA: Diagnosis not present

## 2021-02-09 DIAGNOSIS — E1165 Type 2 diabetes mellitus with hyperglycemia: Secondary | ICD-10-CM

## 2021-02-09 DIAGNOSIS — Z23 Encounter for immunization: Secondary | ICD-10-CM

## 2021-02-09 LAB — COMPLETE METABOLIC PANEL WITH GFR
AG Ratio: 1.5 (calc) (ref 1.0–2.5)
ALT: 46 U/L — ABNORMAL HIGH (ref 6–29)
AST: 39 U/L — ABNORMAL HIGH (ref 10–35)
Albumin: 4.3 g/dL (ref 3.6–5.1)
Alkaline phosphatase (APISO): 77 U/L (ref 31–125)
BUN: 10 mg/dL (ref 7–25)
CO2: 27 mmol/L (ref 20–32)
Calcium: 9.4 mg/dL (ref 8.6–10.2)
Chloride: 101 mmol/L (ref 98–110)
Creat: 0.76 mg/dL (ref 0.50–0.99)
Globulin: 2.8 g/dL (calc) (ref 1.9–3.7)
Glucose, Bld: 293 mg/dL — ABNORMAL HIGH (ref 65–99)
Potassium: 4.1 mmol/L (ref 3.5–5.3)
Sodium: 135 mmol/L (ref 135–146)
Total Bilirubin: 0.4 mg/dL (ref 0.2–1.2)
Total Protein: 7.1 g/dL (ref 6.1–8.1)
eGFR: 98 mL/min/{1.73_m2} (ref >=60)

## 2021-02-09 LAB — LIPID PANEL
Cholesterol: 218 mg/dL — ABNORMAL HIGH (ref <200)
HDL: 31 mg/dL — ABNORMAL LOW (ref >=50)
LDL Cholesterol (Calc): 148 mg/dL (calc) — ABNORMAL HIGH
Non-HDL Cholesterol (Calc): 187 mg/dL (calc) — ABNORMAL HIGH (ref <130)
Total CHOL/HDL Ratio: 7 (calc) — ABNORMAL HIGH (ref <5.0)
Triglycerides: 244 mg/dL — ABNORMAL HIGH (ref <150)

## 2021-02-09 LAB — POCT GLYCOSYLATED HEMOGLOBIN (HGB A1C): Hemoglobin A1C: 11.2 % — AB (ref 4.0–5.6)

## 2021-02-09 NOTE — Assessment & Plan Note (Signed)
Stable, continue present medications.   

## 2021-02-09 NOTE — Progress Notes (Signed)
BP 128/72   Pulse 98   Temp 98.7 F (37.1 C) (Oral)   Resp 16   Ht '5\' 3"'$  (1.6 m)   Wt 190 lb (86.2 kg)   SpO2 98%   BMI 33.66 kg/m    Subjective:    Patient ID: Lauren [name], female    DOB: August 25, 1975, 45 y.o.   MRN: [id number]  HPI: [name] is a 45 y.o. female  Chief Complaint  Patient presents with   Follow-up    4 week recheck   Diabetes: Taking daily Januvia and [place].  Not taking Metformin as it makes her sick.  Elevated blood sugars due to inactivity secondary to cervical spine surgery No hypoglycemic episodes No hyperglycemic episodes Feet problems: None Blood Sugars averaging: About 190 in the AM eye exam within last year Last Hgb A1C: 7.4 last time and 11.2 today  Hypertension  Using medications without difficulty Average home BPs Not checking   Using medication without problems or lightheadedness No chest pain with exertion or shortness of breath No Edema  Elevated Cholesterol Using medications without problems No Muscle aches  Diet: Sits home and eats Exercise: None following surgery   Relevant past medical, surgical, family and social history reviewed and updated as indicated. Interim medical history since our last visit reviewed. Allergies and medications reviewed and updated.  Review of Systems  Per HPI unless specifically indicated above     Objective:    BP 128/72   Pulse 98   Temp 98.7 F (37.1 C) (Oral)   Resp 16   Ht '5\' 3"'$  (1.6 m)   Wt 190 lb (86.2 kg)   SpO2 98%   BMI 33.66 kg/m   Wt Readings from Last 3 Encounters:  02/09/21 190 lb (86.2 kg)  11/21/20 191 lb (86.6 kg)  09/28/20 190 lb 6.4 oz (86.4 kg)    Physical Exam Constitutional:      General: She is not in acute distress.    Appearance: Normal appearance. She is well-developed.  HENT:     Head: Normocephalic and atraumatic.  Eyes:     General: Lids are normal. No scleral icterus.       Right eye: No discharge.        Left eye: No discharge.      Conjunctiva/sclera: Conjunctivae normal.  Neck:     Vascular: No carotid bruit or JVD.  Cardiovascular:     Rate and Rhythm: Normal rate and regular rhythm.     Heart sounds: Normal heart sounds.  Pulmonary:     Effort: Pulmonary effort is normal. No respiratory distress.     Breath sounds: Normal breath sounds.  Abdominal:     Palpations: There is no hepatomegaly or splenomegaly.  Musculoskeletal:        General: Normal range of motion.     Cervical back: Normal range of motion and neck supple.  Skin:    General: Skin is warm and dry.     Coloration: Skin is not pale.     Findings: No rash.  Neurological:     Mental Status: She is alert and oriented to person, place, and time.  Psychiatric:        Behavior: Behavior normal.        Thought Content: Thought content normal.        Judgment: Judgment normal.    Results for orders placed or performed in visit on 02/09/21  POCT HgB A1C  Result Value Ref Range  Hemoglobin A1C 11.2 (A) 4.0 - 5.6 %   HbA1c POC (<> result, manual entry)     HbA1c, POC (prediabetic range)     HbA1c, POC (controlled diabetic range)        Assessment & Plan:   Problem List Items Addressed This Visit       Unprioritized   Essential hypertension    Stable, continue present medications.        Hyperlipidemia    High last time due to non-adherance with Crestor.  Will recheck today      Relevant Orders   Lipid panel   Type 2 diabetes mellitus, uncontrolled ([place]) - Primary    Stop Metformin due to low tolerance.  Start Ozempic .25 mg weekly for 2 weeks and then increase to 1/2 mg.  Samples given. Stop Januvia and continue Farxiga      Relevant Orders   POCT HgB A1C (Completed)   COMPLETE METABOLIC PANEL WITH GFR   Other Visit Diagnoses     Need for influenza vaccination       Relevant Orders   Flu Vaccine QUAD 6+ mos PF IM (Fluarix Quad PF) (Completed)        Follow up plan: Return in about 4 weeks (around  03/09/2021).

## 2021-02-09 NOTE — Assessment & Plan Note (Addendum)
Stop Metformin due to low tolerance.  Start Ozempic .25 mg weekly for 2 weeks and then increase to 1/2 mg.  Samples given. Stop Januvia and continue [place]

## 2021-02-09 NOTE — Assessment & Plan Note (Signed)
High last time due to non-adherance with Crestor.  Will recheck today

## 2021-02-11 ENCOUNTER — Other Ambulatory Visit: Payer: Self-pay | Admitting: Family Medicine

## 2021-02-11 NOTE — Telephone Encounter (Signed)
Requested Prescriptions  Pending Prescriptions Disp Refills  . nicotine (NICODERM CQ - DOSED IN MG/24 HOURS) 14 mg/24hr patch [Pharmacy Med Name: NICOTINE 14 MG/24HR PATCH] 28 patch 3    Sig: PLACE 1 PATCH ONTO THE SKIN DAILY.     Psychiatry:  Drug Dependence Therapy Passed - 02/11/2021 11:26 AM      Passed - Valid encounter within last 12 months    Recent Outpatient Visits          2 days ago Uncontrolled type 2 diabetes mellitus with hyperglycemia [facility])   [facility] Kathrine Haddock, NP   2 months ago Vaginal yeast infection   [place], NP   4 months ago Pre-operative clearance   [facility] [name], PA-C   5 months ago Essential hypertension   [place], [place], DO   7 months ago Positive RPR test   [facility] [name], PA-C      Future Appointments            In 1 month [name], PA-C [place], [facility]

## 2021-02-16 DIAGNOSIS — Z419 Encounter for procedure for purposes other than remedying health state, unspecified: Secondary | ICD-10-CM | POA: Diagnosis not present

## 2021-03-10 ENCOUNTER — Ambulatory Visit: Payer: Managed Care, Other (non HMO) | Admitting: Family Medicine

## 2021-03-13 ENCOUNTER — Other Ambulatory Visit: Payer: Self-pay

## 2021-03-13 ENCOUNTER — Encounter: Payer: Self-pay | Admitting: Family Medicine

## 2021-03-13 ENCOUNTER — Ambulatory Visit (INDEPENDENT_AMBULATORY_CARE_PROVIDER_SITE_OTHER): Payer: Managed Care, Other (non HMO) | Admitting: Family Medicine

## 2021-03-13 VITALS — BP 152/92 | HR 81 | Temp 98.2°F | Resp 16 | Ht 63.0 in | Wt 185.9 lb

## 2021-03-13 DIAGNOSIS — J449 Chronic obstructive pulmonary disease, unspecified: Secondary | ICD-10-CM

## 2021-03-13 DIAGNOSIS — F3342 Major depressive disorder, recurrent, in full remission: Secondary | ICD-10-CM | POA: Diagnosis not present

## 2021-03-13 DIAGNOSIS — Z794 Long term (current) use of insulin: Secondary | ICD-10-CM

## 2021-03-13 DIAGNOSIS — R110 Nausea: Secondary | ICD-10-CM

## 2021-03-13 DIAGNOSIS — Z1231 Encounter for screening mammogram for malignant neoplasm of breast: Secondary | ICD-10-CM

## 2021-03-13 DIAGNOSIS — E782 Mixed hyperlipidemia: Secondary | ICD-10-CM

## 2021-03-13 DIAGNOSIS — I10 Essential (primary) hypertension: Secondary | ICD-10-CM

## 2021-03-13 DIAGNOSIS — E1165 Type 2 diabetes mellitus with hyperglycemia: Secondary | ICD-10-CM

## 2021-03-13 DIAGNOSIS — R7989 Other specified abnormal findings of blood chemistry: Secondary | ICD-10-CM

## 2021-03-13 MED ORDER — LISINOPRIL 20 MG PO TABS
20.0000 mg | ORAL_TABLET | Freq: Every day | ORAL | 3 refills | Status: DC
Start: 2021-03-13 — End: 2021-08-18

## 2021-03-13 MED ORDER — ROSUVASTATIN CALCIUM 20 MG PO TABS: 20.0000 mg | ORAL_TABLET | Freq: Every day | ORAL | 3 refills | Status: DC

## 2021-03-13 MED ORDER — METOCLOPRAMIDE HCL 10 MG PO TABS: 5.0000 mg | ORAL_TABLET | Freq: Three times a day (TID) | ORAL | 0 refills | Status: DC | PRN

## 2021-03-13 MED ORDER — ALBUTEROL SULFATE HFA 108 (90 BASE) MCG/ACT IN AERS: 2.0000 | INHALATION_SPRAY | Freq: Four times a day (QID) | RESPIRATORY_TRACT | 2 refills | Status: DC | PRN

## 2021-03-13 MED ORDER — SITAGLIPTIN PHOSPHATE 100 MG PO TABS: 100.0000 mg | ORAL_TABLET | Freq: Every day | ORAL | 3 refills | Status: DC

## 2021-03-13 NOTE — Patient Instructions (Addendum)
See if you are able to restart [place] with farxiga and do the low dose ozempic a few more weeks We will do a follow up visit to see if you are tolerating it or not Labs will be done in 2 more months   Recent pertinent labs: Lab Results  Component Value Date   HGBA1C 11.2 (A) 02/09/2021   HGBA1C 7.2 (H) 09/28/2020   HGBA1C 7.0 (H) 06/27/2020   Make sure to take you lisinopril daily for blood pressure BP Readings from Last 3 Encounters:  03/13/21 (!) 148/88  02/09/21 128/72  11/21/20 122/80    Take you crestor or rosuvastatin daily at bedtime to help lower cholesterol and reduce risk of heart attack and stroke  Lab Results  Component Value Date   CHOL 218 (H) 02/09/2021   CHOL 227 (H) 09/28/2020   CHOL 215 (H) 06/27/2020   Lab Results  Component Value Date   HDL 31 (L) 02/09/2021   HDL 43 (L) 09/28/2020   HDL 37 (L) 06/27/2020   Lab Results  Component Value Date   LDLCALC 148 (H) 02/09/2021   LDLCALC 152 (H) 09/28/2020   LDLCALC 151 (H) 06/27/2020   Lab Results  Component Value Date   TRIG 244 (H) 02/09/2021   TRIG 180 (H) 09/28/2020   TRIG 144 06/27/2020   Lab Results  Component Value Date   CHOLHDL 7.0 (H) 02/09/2021   CHOLHDL 5.3 (H) 09/28/2020   CHOLHDL 5.8 (H) 06/27/2020   No results found for: LDLDIRECT

## 2021-03-13 NOTE — Progress Notes (Signed)
Name: Lauren [name]   MRN: [id number]    DOB: 09-Jul-1975   Date:03/13/2021       Progress Note  Chief Complaint  Patient presents with   Follow-up   Diabetes   Hypertension   Hyperlipidemia   Depression     Subjective:   Lauren [name] is a 45 y.o. female, presents to clinic for routine f/up  DM:  uncontrolled, last saw Malachy Mood, goes to endocrinology - [facility] Meds changed a month ago stopped metformin and januvia, started ozempic and increased farxiga -samples given Pt currently taking farxiga 10mg  daily  and ozempic 0.50 weekly  Metformin caused vomiting Ozempic is causing nausea and vomiting Stopped [place] but she doesn't know why Previously tried glipizide, no prior insulin  Next OV with endo  - none scheduled Denies polyuria, polydipsia, neuropathy Sugars better ranging 120-150's but she feels persistently sick Recent pertinent labs: Lab Results  Component Value Date   HGBA1C 11.2 (A) 02/09/2021   HGBA1C 7.2 (H) 09/28/2020   HGBA1C 7.0 (H) 06/27/2020   Lab Results  Component Value Date   MICROALBUR 0.7 03/10/2020   LDLCALC 148 (H) 02/09/2021   CREATININE 0.76 02/09/2021   Standard of care and health maintenance: Foot exam:  due DM eye exam:  due ACEI/ARB:  lisinopril- out of meds right now Statin:  crestor - poor compliance  HLD- crestor 20 mg - poor compliance for about the past 6+ months Lab Results  Component Value Date   CHOL 218 (H) 02/09/2021   HDL 31 (L) 02/09/2021   LDLCALC 148 (H) 02/09/2021   TRIG 244 (H) 02/09/2021   CHOLHDL 7.0 (H) 02/09/2021   LFTs elevated recently Lab Results  Component Value Date   ALT 46 (H) 02/09/2021   AST 39 (H) 02/09/2021   ALKPHOS 67 02/08/2018   BILITOT 0.4 02/09/2021     Hypertension:  Currently managed on lisinopril 20 mg Out of meds, not taking Blood pressure today is elevated  BP Readings from Last 3 Encounters:  03/13/21 (!) 152/92  02/09/21 128/72  11/21/20 122/80   Pt denies  CP, SOB, exertional sx, LE edema, palpitation, Ha's, visual disturbances, lightheadedness, hypotension, syncope.    Moods/anxiety/depression on buspar 7.5 tid prn, cymbalta, stopped wellbutrin for smoking cessation, still smoking but for surgery cut back Phq and gad7 reviewed  Depression screen [facility] 2/9 03/13/2021 02/09/2021 11/21/2020  Decreased Interest 0 0 0  Down, Depressed, Hopeless 0 0 0  PHQ - 2 Score 0 0 0  Altered sleeping 0 0 0  Tired, decreased energy 0 0 0  Change in appetite 0 0 0  Feeling bad or failure about yourself  0 0 0  Trouble concentrating 0 0 0  Moving slowly or fidgety/restless 0 0 0  Suicidal thoughts 0 0 0  PHQ-9 Score 0 0 0  Difficult doing work/chores Not difficult at all - Not difficult at all  Some recent data might be hidden   GAD 7 : Generalized Anxiety Score 03/13/2021 02/09/2021 06/30/2020 06/27/2020  Nervous, Anxious, on Edge 0 0 0 1  Control/stop worrying 1 0 0 0  Worry too much - different things 0 0 0 0  Trouble relaxing 0 0 0 0  Restless 0 0 0 0  Easily annoyed or irritable 0 0 0 1  Afraid - awful might happen 0 0 0 1  Total GAD 7 Score 1 0 0 3  Anxiety Difficulty Not difficult at all Not difficult at all Not difficult  at all Not difficult at all    COPD - needs albuterol inhaler refilled, has fall allergies, breathing/wheeze/cough gets worse around this time of year, no on allergy meds   Overdue for mammogram    Current Outpatient Medications:    busPIRone (BUSPAR) 7.5 MG tablet, Take 1 tablet (7.5 mg total) by mouth 3 (three) times daily., Disp: 270 tablet, Rfl: 1   dapagliflozin propanediol (FARXIGA) 10 MG TABS tablet, Take 1 tablet (10 mg total) by mouth daily., Disp: 30 tablet, Rfl: 11   DULoxetine (CYMBALTA) 30 MG capsule, Take 1 capsule (30 mg total) by mouth 2 (two) times daily., Disp: 180 capsule, Rfl: 3   fluticasone (FLONASE) 50 MCG/ACT nasal spray, SPRAY 2 SPRAYS INTO EACH NOSTRIL EVERY DAY, Disp: 48 mL, Rfl: 0   metoCLOPramide  (REGLAN) 10 MG tablet, Take 0.5-1 tablets (5-10 mg total) by mouth 3 (three) times daily as needed for nausea or vomiting (Nausea vomiting)., Disp: 20 tablet, Rfl: 0   nicotine (NICODERM CQ - DOSED IN MG/24 HOURS) 14 mg/24hr patch, PLACE 1 PATCH ONTO THE SKIN DAILY., Disp: 28 patch, Rfl: 3   valACYclovir (VALTREX) 1000 MG tablet, TAKE 1 TABLET BY MOUTH EVERY DAY, Disp: 90 tablet, Rfl: 3   albuterol (PROVENTIL HFA) 108 (90 Base) MCG/ACT inhaler, Inhale 2 puffs into the lungs every 6 (six) hours as needed for wheezing or shortness of breath., Disp: 18 g, Rfl: 2   lisinopril (ZESTRIL) 20 MG tablet, Take 1 tablet (20 mg total) by mouth daily., Disp: 90 tablet, Rfl: 3   rosuvastatin (CRESTOR) 20 MG tablet, Take 1 tablet (20 mg total) by mouth at bedtime., Disp: 90 tablet, Rfl: 3   sitaGLIPtin (JANUVIA) 100 MG tablet, Take 1 tablet (100 mg total) by mouth daily., Disp: 90 tablet, Rfl: 3  Patient Active Problem List   Diagnosis Date Noted   Cervical radiculopathy 08/04/2020   Encounter for screening colonoscopy    Polyp of transverse colon    Polyp of sigmoid colon    Essential hypertension 05/19/2018   History of vitamin D deficiency 05/18/2018   Tobacco dependence 05/18/2018   Morbid obesity ([place]) 01/13/2018   Allergic rhinitis, seasonal 04/23/2017   Genital herpes 04/23/2017   Type 2 diabetes mellitus, uncontrolled ([place]) 04/23/2017   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 ([place]) 05/17/2016   Bronchitis 03/16/2016   Cholesteatoma of attic of right ear 12/05/2015   Nocturnal cough 11/29/2015   GERD (gastroesophageal reflux disease) 11/29/2015   Acid reflux 09/19/2015   Screening for breast cancer 09/19/2015   Chronic pelvic pain in female 09/19/2015   Vitamin D deficiency 12/13/2014   History of herpes genitalis 12/13/2014   Hyperlipidemia 05/26/2014   Smoker 05/26/2014    Past Surgical History:  Procedure Laterality Date   APPENDECTOMY     CESAREAN SECTION     x 4     COLONOSCOPY     COLONOSCOPY WITH PROPOFOL N/A 07/27/2020   Procedure: COLONOSCOPY WITH PROPOFOL;  Surgeon: Virgel Manifold, MD;  Location: [facility] ENDOSCOPY;  Service: Endoscopy;  Laterality: N/A;  COVID POSITIVE 07/05/2020   HERNIA REPAIR  09/25/2011   TUBAL LIGATION     TYMPANOMASTOIDECTOMY Right 12/07/2015   Procedure: TYMPANOMASTOIDECTOMY;  Surgeon: Clyde Canterbury, MD;  Location: [facility] ORS;  Service: ENT;  Laterality: Right;    Family History  Problem Relation Age of Onset   Hypertension Mother    Hyperlipidemia Mother    Diabetes Mother    Arrhythmia Father 18  A-fib   Alcohol abuse Father    Heart disease Brother 53       stent x 1    Alcoholism Brother    Asthma Son    Asperger's syndrome Son     Social History   Tobacco Use   Smoking status: Every Day    Packs/day: 1.00    Years: 28.00    Pack years: 28.00    Types: Cigarettes   Smokeless tobacco: Never  Vaping Use   Vaping Use: Never used  Substance Use Topics   Alcohol use: Not Currently    Alcohol/week: 0.0 standard drinks   Drug use: No     Allergies  Allergen Reactions   Cephalexin    Metformin And Related Nausea And Vomiting    With all doses and regular and extended release forms    Health Maintenance  Topic Date Due   MAMMOGRAM  05/27/2015   OPHTHALMOLOGY EXAM  11/30/2020   COVID-19 Vaccine (3 - Booster for Moderna series) 03/29/2021 (Originally 06/12/2020)   TETANUS/TDAP  07/01/2021   HEMOGLOBIN A1C  08/12/2021   FOOT EXAM  03/13/2022   PAP SMEAR-Modifier  04/27/2023   COLONOSCOPY (Pts 45-54yrs Insurance coverage will need to be confirmed)  07/28/2027   INFLUENZA VACCINE  Completed   Hepatitis C Screening  Completed   HIV Screening  Completed   HPV VACCINES  Aged Out    Chart Review Today: I personally reviewed active problem list, medication list, allergies, family history, social history, health maintenance, notes from last encounter, lab results, imaging with the patient/caregiver  today.   Review of Systems  Constitutional: Negative.   HENT: Negative.    Eyes: Negative.   Respiratory: Negative.    Cardiovascular: Negative.  Negative for chest pain, palpitations and leg swelling.  Gastrointestinal:  Positive for nausea and vomiting. Negative for abdominal distention, abdominal pain, constipation and diarrhea.  Endocrine: Negative.   Genitourinary: Negative.   Musculoskeletal: Negative.   Skin: Negative.   Allergic/Immunologic: Negative.   Neurological: Negative.   Hematological: Negative.   Psychiatric/Behavioral: Negative.    All other systems reviewed and are negative.   Objective:   Vitals:   03/13/21 1029 03/13/21 1109  BP: (!) 148/88 (!) 152/92  Pulse: 81   Resp: 16   Temp: 98.2 F (36.8 C)   TempSrc: Oral   SpO2: 98%   Weight: 185 lb 14.4 oz (84.3 kg)   Height: 5\' 3"  (1.6 m)     Body mass index is 32.93 kg/m.  Physical Exam Vitals and nursing note reviewed.  Constitutional:      General: She is not in acute distress.    Appearance: Normal appearance. She is well-developed. She is obese. She is not ill-appearing, toxic-appearing or diaphoretic.     Interventions: Face mask in place.     Comments: Central obesity, pt appears uncomfortable, alert, non-toxic, appears stated age  HENT:     Head: Normocephalic and atraumatic.     Right Ear: External ear normal.     Left Ear: External ear normal.  Eyes:     General: Lids are normal. No scleral icterus.       Right eye: No discharge.        Left eye: No discharge.     Conjunctiva/sclera: Conjunctivae normal.  Neck:     Trachea: Phonation normal. No tracheal deviation.  Cardiovascular:     Rate and Rhythm: Normal rate and regular rhythm.     Pulses: Normal pulses.  Radial pulses are 2+ on the right side and 2+ on the left side.       Posterior tibial pulses are 2+ on the right side and 2+ on the left side.     Heart sounds: Normal heart sounds. No murmur heard.   No friction  rub. No gallop.  Pulmonary:     Effort: Pulmonary effort is normal. No respiratory distress.     Breath sounds: Normal breath sounds. No stridor. No wheezing, rhonchi or rales.  Chest:     Chest wall: No tenderness.  Abdominal:     General: Bowel sounds are normal. There is no distension.     Palpations: Abdomen is soft.     Tenderness: There is no abdominal tenderness. There is no right CVA tenderness, left CVA tenderness, guarding or rebound.     Comments: Obese soft abd, NTND, normal BS x 4  Musculoskeletal:     Right lower leg: No edema.     Left lower leg: No edema.  Skin:    General: Skin is warm and dry.     Coloration: Skin is not jaundiced or pale.     Findings: No rash.  Neurological:     Mental Status: She is alert.     Motor: No abnormal muscle tone.     Gait: Gait normal.  Psychiatric:        Mood and Affect: Mood normal.        Speech: Speech normal.        Behavior: Behavior normal.    Diabetic Foot Exam - Simple   Simple Foot Form Diabetic Foot exam was performed with the following findings: Yes 03/13/2021 11:00 AM  Visual Inspection No deformities, no ulcerations, no other skin breakdown bilaterally: Yes Sensation Testing Intact to touch and monofilament testing bilaterally: Yes Pulse Check Posterior Tibialis and Dorsalis pulse intact bilaterally: Yes Comments        Assessment & Plan:     ICD-10-CM   1. Type 2 diabetes mellitus with hyperglycemia, with long-term current use of insulin (HCC)  E11.65 sitaGLIPtin (JANUVIA) 100 MG tablet   Z79.4 rosuvastatin (CRESTOR) 20 MG tablet   uncontrolled, off meds and not seeing specialist, labs recently done and reviewed, foot exam done, due for eye exam, close f/up on meds/SE/CBG may need insulin  Significantly worse DM control Recent pertinent labs: Lab Results  Component Value Date   HGBA1C 11.2 (A) 02/09/2021   HGBA1C 7.2 (H) 09/28/2020   HGBA1C 7.0 (H) 06/27/2020   Lab Results  Component Value Date    MICROALBUR 0.7 03/10/2020   LDLCALC 148 (H) 02/09/2021   CREATININE 0.76 02/09/2021   Standard of care and health maintenance: Foot exam:  done today DM eye exam:  due at [place] eye ACEI/ARB:  lisinopril 20 but not taking Statin:  crestor 20 but not taking  Will see if she can tolerate ozempic with time, if not improvement in SE then D/C Intolerant to metformin Can continue farxiga - restart januvia May need basal insulin  Labs due for recheck Nov 25th - OV  Do virtual visit in ~1 m to f/up on blood sugars/meds/SE     2. Uncontrolled type 2 diabetes mellitus with hyperglycemia ([place])  E11.65 Ambulatory referral to Ophthalmology  See above  3. 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 ([place])  F33.42    phq and gad 7 reviewed and neg, moods well controlled currently, continue cymbalta and buspar Depression screen [facility] 2/9 03/13/2021 02/09/2021 11/21/2020  Decreased  Interest 0 0 0  Down, Depressed, Hopeless 0 0 0  PHQ - 2 Score 0 0 0  Altered sleeping 0 0 0  Tired, decreased energy 0 0 0  Change in appetite 0 0 0  Feeling bad or failure about yourself  0 0 0  Trouble concentrating 0 0 0  Moving slowly or fidgety/restless 0 0 0  Suicidal thoughts 0 0 0  PHQ-9 Score 0 0 0  Difficult doing work/chores Not difficult at all - Not difficult at all  Some recent data might be hidden   Doing well overall with mood    4. Hypertension, unspecified type  I10 lisinopril (ZESTRIL) 20 MG tablet   out of meds BP high today, refill on lisinopril, was previously well controlled with 20 mg dose daily BP Readings from Last 3 Encounters:  03/13/21 (!) 152/92  02/09/21 128/72  11/21/20 122/80   Was previously well controlled, refill on lisinopril and recheck BP     5. Hyperlipemia, mixed  E78.2 rosuvastatin (CRESTOR) 20 MG tablet   out of meds, restart statin, labs done previously and reviewed, will need repeat labs after resuming statin  Worse labs and elevated LFTs, she previously  tolerated crestor Lab Results  Component Value Date   CHOL 218 (H) 02/09/2021   HDL 31 (L) 02/09/2021   LDLCALC 148 (H) 02/09/2021   TRIG 244 (H) 02/09/2021   CHOLHDL 7.0 (H) 02/09/2021   Lab Results  Component Value Date   ALT 46 (H) 02/09/2021   AST 39 (H) 02/09/2021   ALKPHOS 67 02/08/2018   BILITOT 0.4 02/09/2021         6. Chronic obstructive pulmonary disease, unspecified COPD type ([place])  J44.9 albuterol (PROVENTIL HFA) 108 (90 Base) MCG/ACT inhaler   Sx stable, uses albuterol rescue inhaler Sig smoking hx, not wishing to stop, smoking cessation again discussed, she cut back for short time earlier this year for surgery Advised adding allergy meds for fall allergies Increased AP diameter, no wheeze or rhonchi on exam today    7. Nausea  R11.0 metoCLOPramide (REGLAN) 10 MG tablet   monitor SE of ozempic - if nausea does not improve over the next 2-4 weeks, reduce dose to 0.25mg  weekly, can try reglan/benadryl, stop ozempic if sx persist    8. Elevated LFTs  R79.89    recheck in 2 months when rechecking lipids and A1C - may be from high lipids for months?        Return for  1 month virtual f/up on med SE/blood sugar and 2 month f/up in office dm/hld/htn labs.   [name], PA-C 03/13/21 11:20 AM

## 2021-03-18 DIAGNOSIS — Z419 Encounter for procedure for purposes other than remedying health state, unspecified: Secondary | ICD-10-CM | POA: Diagnosis not present

## 2021-04-11 ENCOUNTER — Telehealth: Payer: Managed Care, Other (non HMO) | Admitting: Physician Assistant

## 2021-04-11 DIAGNOSIS — B9789 Other viral agents as the cause of diseases classified elsewhere: Secondary | ICD-10-CM

## 2021-04-11 DIAGNOSIS — J019 Acute sinusitis, unspecified: Secondary | ICD-10-CM

## 2021-04-11 MED ORDER — AZELASTINE HCL 0.1 % NA SOLN: 1.0000 | Freq: Two times a day (BID) | NASAL | 0 refills | Status: DC

## 2021-04-11 NOTE — Progress Notes (Signed)
I have spent 5 minutes in review of e-visit questionnaire, review and updating patient chart, medical decision making and response to patient.   Edwardo Wojnarowski Cody Chamya Hunton, PA-C    

## 2021-04-11 NOTE — Progress Notes (Signed)
E-Visit for Sinus Problems  We are sorry that you are not feeling well.  Here is how we plan to help!  Based on what you have shared with me it looks like you have sinusitis.  Sinusitis is inflammation and infection in the sinus cavities of the head.  Based on your presentation I believe you most likely have Acute Viral Sinusitis.This is an infection most likely caused by a virus. There is not specific treatment for viral sinusitis other than to help you with the symptoms until the infection runs its course.  You may use an oral decongestant such as Mucinex D or if you have glaucoma or high blood pressure use plain Mucinex. Saline nasal spray help and can safely be used as often as needed for congestion, I have prescribed: Azelastine nasal spray 2 sprays in each nostril twice a day. Use this along with your Flonase.   Some authorities believe that zinc sprays or the use of Echinacea may shorten the course of your symptoms.  Sinus infections are not as easily transmitted as other respiratory infection, however we still recommend that you avoid close contact with loved ones, especially the very young and elderly.  Remember to wash your hands thoroughly throughout the day as this is the number one way to prevent the spread of infection!  Home Care: Only take medications as instructed by your medical team. Do not take these medications with alcohol. A steam or ultrasonic humidifier can help congestion.  You can place a towel over your head and breathe in the steam from hot water coming from a faucet. Avoid close contacts especially the very young and the elderly. Cover your mouth when you cough or sneeze. Always remember to wash your hands.  Get Help Right Away If: You develop worsening fever or sinus pain. You develop a severe head ache or visual changes. Your symptoms persist after you have completed your treatment plan.  Make sure you Understand these instructions. Will watch your  condition. Will get help right away if you are not doing well or get worse.   Thank you for choosing an e-visit.  Your e-visit answers were reviewed by a board certified advanced clinical practitioner to complete your personal care plan. Depending upon the condition, your plan could have included both over the counter or prescription medications.  Please review your pharmacy choice. Make sure the pharmacy is open so you can pick up prescription now. If there is a problem, you may contact your provider through [employer] and have the prescription routed to another pharmacy.  Your safety is important to [place]. If you have drug allergies check your prescription carefully.   For the next 24 hours you can use MyChart to ask questions about today's visit, request a non-urgent call back, or ask for a work or school excuse. You will get an email in the next two days asking about your experience. I hope that your e-visit has been valuable and will speed your recovery.

## 2021-04-17 ENCOUNTER — Telehealth (INDEPENDENT_AMBULATORY_CARE_PROVIDER_SITE_OTHER): Payer: Managed Care, Other (non HMO) | Admitting: Family Medicine

## 2021-04-17 ENCOUNTER — Other Ambulatory Visit: Payer: Self-pay | Admitting: Family Medicine

## 2021-04-17 DIAGNOSIS — E1165 Type 2 diabetes mellitus with hyperglycemia: Secondary | ICD-10-CM

## 2021-04-17 DIAGNOSIS — Z5181 Encounter for therapeutic drug level monitoring: Secondary | ICD-10-CM

## 2021-04-17 DIAGNOSIS — R7989 Other specified abnormal findings of blood chemistry: Secondary | ICD-10-CM

## 2021-04-17 DIAGNOSIS — F3342 Major depressive disorder, recurrent, in full remission: Secondary | ICD-10-CM

## 2021-04-17 DIAGNOSIS — I10 Essential (primary) hypertension: Secondary | ICD-10-CM | POA: Diagnosis not present

## 2021-04-17 DIAGNOSIS — E782 Mixed hyperlipidemia: Secondary | ICD-10-CM

## 2021-04-17 MED ORDER — OZEMPIC (0.25 OR 0.5 MG/DOSE) 2 MG/1.5ML ~~LOC~~ SOPN: 0.5000 mg | PEN_INJECTOR | SUBCUTANEOUS | 0 refills | Status: DC

## 2021-04-17 NOTE — Progress Notes (Signed)
Name: Lauren [name]   MRN: [id number]    DOB: 03-Apr-1976   Date:04/17/2021       Progress Note  Subjective:    I connected with  Lauren [name]  on 04/17/21 at 10:40 AM EDT by a video enabled telemedicine application and verified that I am speaking with the correct person using two identifiers.  I discussed the limitations of evaluation and management by telemedicine and the availability of in person appointments. The patient expressed understanding and agreed to proceed. Staff also discussed with the patient that there may be a patient responsible charge related to this service. Patient Location: home Provider Location: [facility] Additional Individuals present: none  Chief Complaint  Patient presents with   Follow-up   Diabetes    Pt states new medication showed improvement.    Lauren [name] is a 45 y.o. female, presents for virtual visit for routine follow up on the conditions listed above.  DM:   Pt managing DM with januvia, ozempic Reports good med compliance Pt has no SE from meds. Blood sugars - in the morning fasting 140-170, high's  Denies: Polyuria, polydipsia, vision changes, neuropathy, hypoglycemia Recent pertinent labs: Lab Results  Component Value Date   HGBA1C 11.2 (A) 02/09/2021   HGBA1C 7.2 (H) 09/28/2020   HGBA1C 7.0 (H) 06/27/2020   Lab Results  Component Value Date   MICROALBUR 0.7 03/10/2020   LDLCALC 148 (H) 02/09/2021   CREATININE 0.76 02/09/2021    GI sx - improved with reglan  HTN last BP was high on lisinopril 20 mg  BP Readings from Last 3 Encounters:  03/13/21 (!) 152/92  02/09/21 128/72  11/21/20 122/80   Last lipids uncontrolled, resumed statin- crestor 20 - tolerating Lab Results  Component Value Date   CHOL 218 (H) 02/09/2021   HDL 31 (L) 02/09/2021   LDLCALC 148 (H) 02/09/2021   TRIG 244 (H) 02/09/2021   CHOLHDL 7.0 (H) 02/09/2021       Patient Active Problem List   Diagnosis Date Noted   Cervical radiculopathy  08/04/2020   Encounter for screening colonoscopy    Polyp of transverse colon    Polyp of sigmoid colon    Essential hypertension 05/19/2018   History of vitamin D deficiency 05/18/2018   Tobacco dependence 05/18/2018   Morbid obesity ([place]) 01/13/2018   Allergic rhinitis, seasonal 04/23/2017   Genital herpes 04/23/2017   Type 2 diabetes mellitus, uncontrolled 04/23/2017   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 ([place]) 05/17/2016   Bronchitis 03/16/2016   Cholesteatoma of attic of right ear 12/05/2015   Nocturnal cough 11/29/2015   GERD (gastroesophageal reflux disease) 11/29/2015   Acid reflux 09/19/2015   Screening for breast cancer 09/19/2015   Chronic pelvic pain in female 09/19/2015   Vitamin D deficiency 12/13/2014   History of herpes genitalis 12/13/2014   Hyperlipidemia 05/26/2014   Smoker 05/26/2014    Current Outpatient Medications:    albuterol (PROVENTIL HFA) 108 (90 Base) MCG/ACT inhaler, Inhale 2 puffs into the lungs every 6 (six) hours as needed for wheezing or shortness of breath., Disp: 18 g, Rfl: 2   azelastine (ASTELIN) 0.1 % nasal spray, Place 1 spray into both nostrils 2 (two) times daily. Use in each nostril as directed, Disp: 30 mL, Rfl: 0   busPIRone (BUSPAR) 7.5 MG tablet, Take 1 tablet (7.5 mg total) by mouth 3 (three) times daily., Disp: 270 tablet, Rfl: 1   dapagliflozin propanediol (FARXIGA) 10 MG TABS tablet, Take 1  tablet (10 mg total) by mouth daily., Disp: 30 tablet, Rfl: 11   DULoxetine (CYMBALTA) 30 MG capsule, Take 1 capsule (30 mg total) by mouth 2 (two) times daily., Disp: 180 capsule, Rfl: 3   fluticasone (FLONASE) 50 MCG/ACT nasal spray, SPRAY 2 SPRAYS INTO EACH NOSTRIL EVERY DAY, Disp: 48 mL, Rfl: 0   lisinopril (ZESTRIL) 20 MG tablet, Take 1 tablet (20 mg total) by mouth daily., Disp: 90 tablet, Rfl: 3   metoCLOPramide (REGLAN) 10 MG tablet, Take 0.5-1 tablets (5-10 mg total) by mouth 3 (three) times daily as needed for nausea or  vomiting (Nausea vomiting)., Disp: 20 tablet, Rfl: 0   nicotine (NICODERM CQ - DOSED IN MG/24 HOURS) 14 mg/24hr patch, PLACE 1 PATCH ONTO THE SKIN DAILY., Disp: 28 patch, Rfl: 3   rosuvastatin (CRESTOR) 20 MG tablet, Take 1 tablet (20 mg total) by mouth at bedtime., Disp: 90 tablet, Rfl: 3   sitaGLIPtin (JANUVIA) 100 MG tablet, Take 1 tablet (100 mg total) by mouth daily., Disp: 90 tablet, Rfl: 3   valACYclovir (VALTREX) 1000 MG tablet, TAKE 1 TABLET BY MOUTH EVERY DAY, Disp: 90 tablet, Rfl: 3 Allergies  Allergen Reactions   Cephalexin    Metformin And Related Nausea And Vomiting    With all doses and regular and extended release forms    Past Surgical History:  Procedure Laterality Date   APPENDECTOMY     CESAREAN SECTION     x 4    COLONOSCOPY     COLONOSCOPY WITH PROPOFOL N/A 07/27/2020   Procedure: COLONOSCOPY WITH PROPOFOL;  Surgeon: Virgel Manifold, MD;  Location: [facility] END[facility];  Service: Endoscopy;  Laterality: N/A;  COVID POSITIVE 07/05/2020   HERNIA REPAIR  09/25/2011   TUBAL LIGATION     TYMPANOMASTOIDECTOMY Right 12/07/2015   Procedure: TYMPANOMASTOIDECTOMY;  Surgeon: Clyde Canterbury, MD;  Location: [facility] ORS;  Service: ENT;  Laterality: Right;   Family History  Problem Relation Age of Onset   Hypertension Mother    Hyperlipidemia Mother    Diabetes Mother    Arrhythmia Father 56       A-fib   Alcohol abuse Father    Heart disease Brother 29       stent x 1    Alcoholism Brother    Asthma Son    Asperger's syndrome Son    Social History   Socioeconomic History   Marital status: Single    Spouse name: Not on file   Number of children: 4   Years of education: Not on file   Highest education level: GED or equivalent  Occupational History    Employer: [employer]  Tobacco Use   Smoking status: Every Day    Packs/day: 1.00    Years: 28.00    Pack years: 28.00    Types: Cigarettes   Smokeless tobacco: Never  Vaping Use   Vaping Use: Never used  Substance and  Sexual Activity   Alcohol use: Not Currently    Alcohol/week: 0.0 standard drinks   Drug use: No   Sexual activity: Yes    Partners: Male  Other Topics Concern   Not on file  Social History Narrative   Not on file   Social Determinants of Health   Financial Resource Strain: Not on file  Food Insecurity: Not on file  Transportation Needs: Not on file  Physical Activity: Not on file  Stress: Not on file  Social Connections: Not on file  Intimate Partner Violence: Not on file  Chart Review Today: I personally reviewed active problem list, medication list, allergies, family history, social history, health maintenance, notes from last encounter, lab results, imaging with the patient/caregiver today.   Review of Systems  Constitutional: Negative.   HENT: Negative.    Eyes: Negative.   Respiratory: Negative.    Cardiovascular: Negative.   Gastrointestinal: Negative.   Endocrine: Negative.   Genitourinary: Negative.   Musculoskeletal: Negative.   Skin: Negative.   Allergic/Immunologic: Negative.   Neurological: Negative.   Hematological: Negative.   Psychiatric/Behavioral: Negative.    All other systems reviewed and are negative.    Objective:    Virtual encounter, vitals limited, only able to obtain the following There were no vitals filed for this visit. There is no height or weight on file to calculate BMI. Nursing Note and Vital Signs reviewed.  Physical Exam Vitals and nursing note reviewed.  Constitutional:      Appearance: She is normal weight.  Pulmonary:     Effort: No respiratory distress.  Neurological:     Mental Status: She is alert.    PE limited by video/virtual encounter  No results found for this or any previous visit (from the past 72 hour(s)).  PHQ2/9: Depression screen [facility] 2/9 04/17/2021 03/13/2021 02/09/2021 11/21/2020 09/05/2020  Decreased Interest 0 0 0 0 0  Down, Depressed, Hopeless 0 0 0 0 0  PHQ - 2 Score 0 0 0 0 0  Altered  sleeping 0 0 0 0 -  Tired, decreased energy 0 0 0 0 -  Change in appetite 0 0 0 0 -  Feeling bad or failure about yourself  0 0 0 0 -  Trouble concentrating 0 0 0 0 -  Moving slowly or fidgety/restless 0 0 0 0 -  Suicidal thoughts 0 0 0 0 -  PHQ-9 Score 0 0 0 0 -  Difficult doing work/chores Not difficult at all Not difficult at all - Not difficult at all -  Some recent data might be hidden   PHQ-2/9 Result is neg, reviewed today  Fall Risk: Fall Risk  04/17/2021 03/13/2021 02/09/2021 11/21/2020 09/28/2020  Falls in the past year? 0 0 0 1 1  Number falls in past yr: 0 0 0 0 0  Injury with Fall? 0 0 0 1 0  Comment - - - - -  Risk for fall due to : No Fall Risks - - - History of fall(s)  Follow up Falls prevention discussed - Falls evaluation completed - Falls prevention discussed     Assessment and Plan:     ICD-10-CM   1. Uncontrolled type 2 diabetes mellitus with hyperglycemia (HCC)  E11.65 Semaglutide,0.25 or 0.5MG /DOS, (OZEMPIC, 0.25 OR 0.5 MG/DOSE,) 2 MG/1.5ML SOPN    COMPLETE METABOLIC PANEL WITH GFR    Lipid panel    Hemoglobin A1c   morning sugars 140-170's per pt, A1C recheck in one month - plan below - if insurance won't cover ozempic will need samples or other GLP-1    2. Essential hypertension  G38 COMPLETE METABOLIC PANEL WITH GFR    3. Mixed hyperlipidemia  V56.4 COMPLETE METABOLIC PANEL WITH GFR    Lipid panel   back on statin    4. Elevated LFTs  R79.89     5. Encounter for medication monitoring  P32.95 COMPLETE METABOLIC PANEL WITH GFR    Lipid panel    Hemoglobin A1c     DM uncontrolled - she reports sugars better with monitoring  A1C recheck due after  11/25 - labs ordered today Pt to continue ozempic, januvia, farxiga and diet/lifestyle efforts - if A1C is still not at goal we will do a f/up appt on labs and adjsut managmenet  Pt states she is back on statin, last labs were high will recheck and also recheck elevated LFTs    I discussed the  assessment and treatment plan with the patient. The patient was provided an opportunity to ask questions and all were answered. The patient agreed with the plan and demonstrated an understanding of the instructions.  The patient was advised to call back or seek an in-person evaluation if the symptoms worsen or if the condition fails to improve as anticipated.  I provided 25+ minutes of non-face-to-face time during this encounter.  [name], PA-C 04/17/21 10:47 AM

## 2021-04-18 DIAGNOSIS — Z419 Encounter for procedure for purposes other than remedying health state, unspecified: Secondary | ICD-10-CM | POA: Diagnosis not present

## 2021-05-16 ENCOUNTER — Encounter: Payer: Self-pay | Admitting: Family Medicine

## 2021-05-18 DIAGNOSIS — Z419 Encounter for procedure for purposes other than remedying health state, unspecified: Secondary | ICD-10-CM | POA: Diagnosis not present

## 2021-05-19 ENCOUNTER — Encounter: Payer: Self-pay | Admitting: Internal Medicine

## 2021-05-19 ENCOUNTER — Ambulatory Visit (INDEPENDENT_AMBULATORY_CARE_PROVIDER_SITE_OTHER): Payer: Managed Care, Other (non HMO) | Admitting: Internal Medicine

## 2021-05-19 VITALS — BP 126/72 | HR 102 | Temp 98.0°F | Resp 16 | Ht 63.0 in | Wt 185.7 lb

## 2021-05-19 DIAGNOSIS — K219 Gastro-esophageal reflux disease without esophagitis: Secondary | ICD-10-CM

## 2021-05-19 DIAGNOSIS — I10 Essential (primary) hypertension: Secondary | ICD-10-CM | POA: Diagnosis not present

## 2021-05-19 DIAGNOSIS — R7989 Other specified abnormal findings of blood chemistry: Secondary | ICD-10-CM

## 2021-05-19 DIAGNOSIS — H9201 Otalgia, right ear: Secondary | ICD-10-CM

## 2021-05-19 DIAGNOSIS — G8929 Other chronic pain: Secondary | ICD-10-CM

## 2021-05-19 DIAGNOSIS — E1165 Type 2 diabetes mellitus with hyperglycemia: Secondary | ICD-10-CM

## 2021-05-19 DIAGNOSIS — E782 Mixed hyperlipidemia: Secondary | ICD-10-CM

## 2021-05-19 DIAGNOSIS — F3342 Major depressive disorder, recurrent, in full remission: Secondary | ICD-10-CM

## 2021-05-19 NOTE — Patient Instructions (Addendum)
It was great seeing you today!  Plan discussed at today's visit: -Blood work ordered today, results will be uploaded to [place].  -Call to reschedule mammogram -ENT referral placed  Follow up in: 3 months   Take care and let us know if you have any questions or concerns prior to your next visit.  Dr. Rosana Berger  Eardrum Rupture, Adult An eardrum rupture is a hole (perforation) in the eardrum. The eardrum is a thin, round tissue inside of the ear that separates the ear canal from the middle ear. The eardrum is also called the tympanic membrane. It transfers sound vibrations through small bones in the middle ear to the hearing nerve in the inner ear. It also protects the middle ear from germs. An eardrum rupture can cause pain and hearing loss. What are the causes? This condition may be caused by: An infection. A sudden injury, such as from: Inserting a thin, sharp object into the ear. A hit to the side of the head, especially by an open hand. Falling onto water or a flat surface. A rapid change in pressure, such as from flying or scuba diving. A sudden increase in pressure against the eardrum, such as from an explosion or a very loud noise. Inserting a cotton-tipped swab in the ear. A long-term eustachian tube disorder. Eustachian tubes are parts of the body that connect each middle ear space to the back of the nose. A medical procedure or surgery, such as a procedure to remove wax from the ear canal. Removing a pressure equalization tube(PE tube) that was surgically placed through the eardrum. Having a PE tube fall out. What increases the risk? You are more likely to develop this condition if: You have had PE tubes inserted in your ears. You have an ear infection. You play sports that: Involve balls or contact with other players. Take place in water, such as diving, scuba diving, or waterskiing. What are the signs or symptoms? Symptoms of this condition include: Sudden pain at the  time of the injury. Ear pain that suddenly improves. Ringing in the ear after the injury. Drainage from the ear. The drainage may be clear, cloudy or pus-like, or bloody. Hearing loss. Dizziness. How is this diagnosed? This condition is diagnosed based on your symptoms and medical history as well as a physical exam. Your health care provider can usually see a perforation using an ear scope (otoscope). You may have tests, such as: A hearing test (audiogram) to check for hearing loss. A test in which a sample of ear drainage is tested for infection (culture). How is this treated? An eardrum typically heals on its own within a few weeks. If your eardrum does not heal, your health care provider may recommend a procedure to place a patch over your eardrum or surgery to repair your eardrum. Your health care provider may also prescribe antibiotic medicines to help prevent infection. If the ear heals completely, any hearing loss should be temporary. Follow these instructions at home: Medicines Take over-the-counter and prescription medicines only as told by your health care provider. If you were prescribed an antibiotic medicine, use it as told by your health care provider. Do not stop using the antibiotic even if you start to feel better. Ear care Keep your ear dry. This is very important. Follow instructions from your health care provider about how to keep your ear dry. You may need to wear waterproof earplugs when bathing and swimming. If directed, apply heat to your affected ear as often  as told by your health care provider. Use the heat source that your health care provider recommends, such as a moist heat pack or a heating pad. This will help to relieve pain. Place a towel between your skin and the heat source. Leave the heat on for 20-30 minutes. Remove the heat if your skin turns bright red. This is especially important if you are unable to feel pain, heat, or cold. You have a greater risk of  getting burned. General instructions Return to sports and activities as told by your health care provider. Ask your health care provider what activities are safe for you. Wear headgear with ear protection when you play sports in which ear injuries are common. Talk to your health care provider before traveling by plane. Keep all follow-up visits. This is important. Contact a health care provider if: You have a fever. You have ear pain. You have mucus or blood draining from your ear. You have hearing loss, dizziness, or ringing in your ear. Get help right away if: You have sudden hearing loss. You are very dizzy. You have severe ear pain. Your face feels weak or becomes limp (paralyzed). These symptoms may represent a serious problem that is an emergency. Do not wait to see if the symptoms will go away. Get medical help right away. Call your local emergency services (911 in the [place] Do not drive yourself to the hospital. Summary An eardrum rupture is a hole (perforation) in the eardrum that can cause pain and hearing loss. It is usually caused by a sudden injury to the ear. The eardrum will likely heal on its own within a few weeks. In some cases, surgery may be necessary. Follow instructions from your health care provider about how to keep your ear dry as it heals. This information is not intended to replace advice given to you by your health care provider. Make sure you discuss any questions you have with your health care provider. Document Revised: 04/25/2020 Document Reviewed: 04/25/2020 Elsevier Patient Education  2022 [employer].

## 2021-05-19 NOTE — Progress Notes (Signed)
Established Patient Office Visit  Subjective:  Patient ID: Lauren [name], female    DOB: Oct 05, 1975  Age: 45 y.o. MRN: [id number]  CC:  Chief Complaint  Patient presents with   Hyperlipidemia   Hypertension   Diabetes    HPI Lauren [name] presents for follow up on chronic medication conditions.   RIGHT EAR PAIN Duration: 3 weeks Involved ear(s): right Severity:  moderate  Quality:  sharp Fever: noOtorrhea: yes, foul smelling purulent drainage with ear wax  Upper respiratory infection symptoms: no Pruritus: no Hearing loss:  decreased hearing on right side Water immersion no Using Q-tips: yes Recurrent otitis media: no Status: worse Treatments attempted: none, nasal steroid for URI a few weeks ago Hx of multiple ear procedures in the past, not currently following with ENT   Diabetes, Type 2: -Last A1c : 8/22: 11.2%, had cervical surgery which is why A1c went up according to patient  -Medications: Januvia 100, Farxiga 10 - was taking Ozempic 0.5 mg weekly but discontinued to nausea  -Patient is compliant with the above medications and reports no side effects.  -Checking BG at home: ranging post prandial 170-180, a couple times went over 200. Denies hypoglycemia  -Exercise: walks 0.5 mile a day -Eye exam: 9/22 -Foot exam: 9/22 -Microalbumin: Due today, last 9/21  -Statin: yes -PNA vaccine: yes -Denies symptoms of hypoglycemia, polyuria, polydipsia, numbness extremities, foot ulcers/trauma.   Hypertension: -Medications: Lisinopril 20 -Patient is compliant with above medications and reports no side effects. -Checking BP at home (average): doesn't check at home -Denies any SOB, CP, vision changes, LE edema or symptoms of hypotension  HLD: -Medications: Crestor 20 restarted at last visit -Patient is compliant with above medications and reports no side effects.  -Last lipid panel: 8/22: TC 218, HDL 31, triglycerides 244, LDL 148  Elevated LFTs: -CMP 8/22: ALT 46,  AST 39   Health Maintenance: -Blood work up to date, recheck lipids/CMP in 3 months -Mammogram: last 2015, ordered but not yet scheduled  -Colonoscopy 2022, repeat in 5 years    Past Medical History:  Diagnosis Date   Allergic rhinitis    Anxiety    Chronic depression    Diabetes mellitus without complication (HCC)    Elevated LFTs    Genital herpes    GERD (gastroesophageal reflux disease)    Hyperlipidemia    Vitamin D deficiency     Past Surgical History:  Procedure Laterality Date   APPENDECTOMY     CESAREAN SECTION     x 4    COLONOSCOPY     COLONOSCOPY WITH PROPOFOL N/A 07/27/2020   Procedure: COLONOSCOPY WITH PROPOFOL;  Surgeon: Virgel Manifold, MD;  Location: [facility];  Service: Endoscopy;  Laterality: N/A;  COVID POSITIVE 07/05/2020   HERNIA REPAIR  09/25/2011   TUBAL LIGATION     TYMPANOMASTOIDECTOMY Right 12/07/2015   Procedure: TYMPANOMASTOIDECTOMY;  Surgeon: Clyde Canterbury, MD;  Location: [facility] ORS;  Service: ENT;  Laterality: Right;    Family History  Problem Relation Age of Onset   Hypertension Mother    Hyperlipidemia Mother    Diabetes Mother    Arrhythmia Father 60       A-fib   Alcohol abuse Father    Heart disease Brother 17       stent x 1    Alcoholism Brother    Asthma Son    Asperger's syndrome Son     Social History   Socioeconomic History   Marital status: Single  Spouse name: Not on file   Number of children: 4   Years of education: Not on file   Highest education level: GED or equivalent  Occupational History    Employer: [employer]  Tobacco Use   Smoking status: Every Day    Packs/day: 1.00    Years: 28.00    Pack years: 28.00    Types: Cigarettes   Smokeless tobacco: Never  Vaping Use   Vaping Use: Never used  Substance and Sexual Activity   Alcohol use: Not Currently    Alcohol/week: 0.0 standard drinks   Drug use: No   Sexual activity: Yes    Partners: Male  Other Topics Concern   Not on file  Social History  Narrative   Not on file   Social Determinants of Health   Financial Resource Strain: Not on file  Food Insecurity: Not on file  Transportation Needs: Not on file  Physical Activity: Not on file  Stress: Not on file  Social Connections: Not on file  Intimate Partner Violence: Not on file    Outpatient Medications Prior to Visit  Medication Sig Dispense Refill   albuterol (PROVENTIL HFA) 108 (90 Base) MCG/ACT inhaler Inhale 2 puffs into the lungs every 6 (six) hours as needed for wheezing or shortness of breath. 18 g 2   azelastine (ASTELIN) 0.1 % nasal spray Place 1 spray into both nostrils 2 (two) times daily. Use in each nostril as directed 30 mL 0   busPIRone (BUSPAR) 7.5 MG tablet Take 1 tablet (7.5 mg total) by mouth 3 (three) times daily. 270 tablet 1   dapagliflozin propanediol (FARXIGA) 10 MG TABS tablet Take 1 tablet (10 mg total) by mouth daily. 30 tablet 11   DULoxetine (CYMBALTA) 30 MG capsule Take 1 capsule (30 mg total) by mouth 2 (two) times daily. 180 capsule 3   fluticasone (FLONASE) 50 MCG/ACT nasal spray SPRAY 2 SPRAYS INTO EACH NOSTRIL EVERY DAY 48 mL 0   lisinopril (ZESTRIL) 20 MG tablet Take 1 tablet (20 mg total) by mouth daily. 90 tablet 3   metoCLOPramide (REGLAN) 10 MG tablet Take 0.5-1 tablets (5-10 mg total) by mouth 3 (three) times daily as needed for nausea or vomiting (Nausea vomiting). 20 tablet 0   nicotine (NICODERM CQ - DOSED IN MG/24 HOURS) 14 mg/24hr patch PLACE 1 PATCH ONTO THE SKIN DAILY. 28 patch 3   rosuvastatin (CRESTOR) 20 MG tablet Take 1 tablet (20 mg total) by mouth at bedtime. 90 tablet 3   Semaglutide,0.25 or 0.5MG/DOS, (OZEMPIC, 0.25 OR 0.5 MG/DOSE,) 2 MG/1.5ML SOPN Inject 0.5 mg into the skin once a week. 4.5 mL 0   sitaGLIPtin (JANUVIA) 100 MG tablet Take 1 tablet (100 mg total) by mouth daily. 90 tablet 3   valACYclovir (VALTREX) 1000 MG tablet TAKE 1 TABLET BY MOUTH EVERY DAY 90 tablet 3   No facility-administered medications prior to  visit.    Allergies  Allergen Reactions   Cephalexin    Metformin And Related Nausea And Vomiting    With all doses and regular and extended release forms    ROS Review of Systems  Constitutional:  Negative for chills and fever.  HENT:  Positive for ear discharge, ear pain, hearing loss and tinnitus. Negative for congestion.   Eyes:  Negative for visual disturbance.  Respiratory:  Negative for cough and shortness of breath.   Cardiovascular:  Negative for chest pain.  Gastrointestinal:  Negative for abdominal pain.  Neurological:  Negative for dizziness and  headaches.     Objective:    Physical Exam Constitutional:      Appearance: Normal appearance.  HENT:     Head: Normocephalic and atraumatic.     Right Ear: Ear canal and external ear normal.     Left Ear: Tympanic membrane, ear canal and external ear normal.     Ears:     Comments: TM perf ? No drainage or signs of infection noted    Mouth/Throat:     Mouth: Mucous membranes are moist.     Pharynx: Oropharynx is clear.  Eyes:     Conjunctiva/sclera: Conjunctivae normal.  Cardiovascular:     Rate and Rhythm: Normal rate and regular rhythm.  Pulmonary:     Effort: Pulmonary effort is normal.     Breath sounds: Normal breath sounds.  Musculoskeletal:     Right lower leg: No edema.     Left lower leg: No edema.  Skin:    General: Skin is warm and dry.  Neurological:     General: No focal deficit present.     Mental Status: She is alert. Mental status is at baseline.  Psychiatric:        Mood and Affect: Mood normal.        Behavior: Behavior normal.    BP 126/72   Pulse (!) 102   Temp 98 F (36.7 C)   Resp 16   Ht 5' 3"  (1.6 m)   Wt 185 lb 11.2 oz (84.2 kg)   SpO2 97%   BMI 32.90 kg/m  Wt Readings from Last 3 Encounters:  03/13/21 185 lb 14.4 oz (84.3 kg)  02/09/21 190 lb (86.2 kg)  11/21/20 191 lb (86.6 kg)     Health Maintenance Due  Topic Date Due   MAMMOGRAM  05/27/2015   COVID-19 Vaccine  (3 - Booster for Moderna series) 03/07/2020   OPHTHALMOLOGY EXAM  11/30/2020    There are no preventive care reminders to display for this patient.  Lab Results  Component Value Date   TSH 1.21 10/18/2016   Lab Results  Component Value Date   WBC 7.2 09/28/2020   HGB 14.0 09/28/2020   HCT 42.5 09/28/2020   MCV 88.9 09/28/2020   PLT 222 09/28/2020   Lab Results  Component Value Date   NA 135 02/09/2021   K 4.1 02/09/2021   CO2 27 02/09/2021   GLUCOSE 293 (H) 02/09/2021   BUN 10 02/09/2021   CREATININE 0.76 02/09/2021   BILITOT 0.4 02/09/2021   ALKPHOS 67 02/08/2018   AST 39 (H) 02/09/2021   ALT 46 (H) 02/09/2021   PROT 7.1 02/09/2021   ALBUMIN 4.6 02/08/2018   CALCIUM 9.4 02/09/2021   ANIONGAP 7 02/08/2018   EGFR 98 02/09/2021   Lab Results  Component Value Date   CHOL 218 (H) 02/09/2021   Lab Results  Component Value Date   HDL 31 (L) 02/09/2021   Lab Results  Component Value Date   LDLCALC 148 (H) 02/09/2021   Lab Results  Component Value Date   TRIG 244 (H) 02/09/2021   Lab Results  Component Value Date   CHOLHDL 7.0 (H) 02/09/2021   Lab Results  Component Value Date   HGBA1C 11.2 (A) 02/09/2021      Assessment & Plan:   1. Uncontrolled type 2 diabetes mellitus with hyperglycemia ([place]): A1c 3 months ago very uncontrolled, states she had a spinal surgery and had a poor dietary compliance and exercise habits during recovery time. Recheck  A1c today, discuss a different GLP if A1c still uncontrolled. Follow up in 3 months.   - Urine Microalbumin w/creat. ratio - HgB A1c  2. Essential hypertension/Elevated LFTs: Stable, no changes to medication made today, repeat CMP.  - COMPLETE METABOLIC PANEL WITH GFR  3. Mixed hyperlipidemia: Continue statin, recheck lipid panel today may need to increase statin dose.   - Lipid panel  4. Chronic right ear pain: Concern for possible TM perforation on exam and with history of frequent drainage. Referral to  ENT placed. She will stop using q-tips in the meantime and will not submerge her ear in water.   - Ambulatory referral to ENT   Follow-up: Return in about 3 months (around 08/17/2021).    [name], DO

## 2021-05-20 LAB — LIPID PANEL
Cholesterol: 210 mg/dL — ABNORMAL HIGH (ref <200)
HDL: 28 mg/dL — ABNORMAL LOW (ref >=50)
LDL Cholesterol (Calc): 155 mg/dL (calc) — ABNORMAL HIGH
Non-HDL Cholesterol (Calc): 182 mg/dL (calc) — ABNORMAL HIGH (ref <130)
Total CHOL/HDL Ratio: 7.5 (calc) — ABNORMAL HIGH (ref <5.0)
Triglycerides: 148 mg/dL (ref <150)

## 2021-05-20 LAB — COMPLETE METABOLIC PANEL WITH GFR
AG Ratio: 1.6 (calc) (ref 1.0–2.5)
ALT: 40 U/L — ABNORMAL HIGH (ref 6–29)
AST: 35 U/L (ref 10–35)
Albumin: 4.2 g/dL (ref 3.6–5.1)
Alkaline phosphatase (APISO): 67 U/L (ref 31–125)
BUN: 9 mg/dL (ref 7–25)
CO2: 26 mmol/L (ref 20–32)
Calcium: 9.3 mg/dL (ref 8.6–10.2)
Chloride: 103 mmol/L (ref 98–110)
Creat: 0.59 mg/dL (ref 0.50–0.99)
Globulin: 2.7 g/dL (calc) (ref 1.9–3.7)
Glucose, Bld: 117 mg/dL — ABNORMAL HIGH (ref 65–99)
Potassium: 4.3 mmol/L (ref 3.5–5.3)
Sodium: 137 mmol/L (ref 135–146)
Total Bilirubin: 0.4 mg/dL (ref 0.2–1.2)
Total Protein: 6.9 g/dL (ref 6.1–8.1)
eGFR: 113 mL/min/{1.73_m2} (ref >=60)

## 2021-05-20 LAB — HEMOGLOBIN A1C
Hgb A1c MFr Bld: 7.2 % of total Hgb — ABNORMAL HIGH (ref <5.7)
Mean Plasma Glucose: 160 mg/dL
eAG (mmol/L): 8.9 mmol/L

## 2021-05-20 LAB — MICROALBUMIN / CREATININE URINE RATIO
Creatinine, Urine: 161 mg/dL (ref 20–275)
Microalb Creat Ratio: 4 mcg/mg creat (ref <30)
Microalb, Ur: 0.6 mg/dL

## 2021-05-25 LAB — HM DIABETES EYE EXAM

## 2021-05-26 DIAGNOSIS — H5213 Myopia, bilateral: Secondary | ICD-10-CM | POA: Diagnosis not present

## 2021-06-18 DIAGNOSIS — Z419 Encounter for procedure for purposes other than remedying health state, unspecified: Secondary | ICD-10-CM | POA: Diagnosis not present

## 2021-07-19 DIAGNOSIS — Z419 Encounter for procedure for purposes other than remedying health state, unspecified: Secondary | ICD-10-CM | POA: Diagnosis not present

## 2021-08-16 DIAGNOSIS — Z419 Encounter for procedure for purposes other than remedying health state, unspecified: Secondary | ICD-10-CM | POA: Diagnosis not present

## 2021-08-18 ENCOUNTER — Telehealth: Payer: Self-pay | Admitting: *Deleted

## 2021-08-18 ENCOUNTER — Encounter: Payer: Self-pay | Admitting: Internal Medicine

## 2021-08-18 ENCOUNTER — Ambulatory Visit (INDEPENDENT_AMBULATORY_CARE_PROVIDER_SITE_OTHER): Payer: Managed Care, Other (non HMO) | Admitting: Internal Medicine

## 2021-08-18 ENCOUNTER — Other Ambulatory Visit: Payer: Self-pay

## 2021-08-18 VITALS — BP 118/82 | HR 91 | Temp 98.3°F | Resp 16 | Ht 63.0 in | Wt 186.5 lb

## 2021-08-18 DIAGNOSIS — F3342 Major depressive disorder, recurrent, in full remission: Secondary | ICD-10-CM

## 2021-08-18 DIAGNOSIS — Z8619 Personal history of other infectious and parasitic diseases: Secondary | ICD-10-CM | POA: Diagnosis not present

## 2021-08-18 DIAGNOSIS — I10 Essential (primary) hypertension: Secondary | ICD-10-CM | POA: Diagnosis not present

## 2021-08-18 DIAGNOSIS — F339 Major depressive disorder, recurrent, unspecified: Secondary | ICD-10-CM

## 2021-08-18 DIAGNOSIS — E1165 Type 2 diabetes mellitus with hyperglycemia: Secondary | ICD-10-CM

## 2021-08-18 DIAGNOSIS — E119 Type 2 diabetes mellitus without complications: Secondary | ICD-10-CM | POA: Insufficient documentation

## 2021-08-18 DIAGNOSIS — E669 Obesity, unspecified: Secondary | ICD-10-CM | POA: Insufficient documentation

## 2021-08-18 DIAGNOSIS — Z23 Encounter for immunization: Secondary | ICD-10-CM

## 2021-08-18 DIAGNOSIS — E782 Mixed hyperlipidemia: Secondary | ICD-10-CM

## 2021-08-18 HISTORY — DX: Type 2 diabetes mellitus with hyperglycemia: E11.65

## 2021-08-18 MED ORDER — ROSUVASTATIN CALCIUM 40 MG PO TABS: 40.0000 mg | ORAL_TABLET | Freq: Every day | ORAL | 3 refills | Status: DC

## 2021-08-18 MED ORDER — SITAGLIPTIN PHOSPHATE 100 MG PO TABS: 100.0000 mg | ORAL_TABLET | Freq: Every day | ORAL | 3 refills | Status: DC

## 2021-08-18 MED ORDER — DAPAGLIFLOZIN PROPANEDIOL 10 MG PO TABS: 10.0000 mg | ORAL_TABLET | Freq: Every day | ORAL | 11 refills | Status: DC

## 2021-08-18 MED ORDER — LISINOPRIL 20 MG PO TABS: 20.0000 mg | ORAL_TABLET | Freq: Every day | ORAL | 3 refills | Status: DC

## 2021-08-18 MED ORDER — VALACYCLOVIR HCL 1 G PO TABS: 1000.0000 mg | ORAL_TABLET | Freq: Every day | ORAL | 3 refills | Status: DC

## 2021-08-18 MED ORDER — DULOXETINE HCL 30 MG PO CPEP: 30.0000 mg | ORAL_CAPSULE | Freq: Two times a day (BID) | ORAL | 3 refills | Status: DC

## 2021-08-18 NOTE — Assessment & Plan Note (Signed)
Reviewed lipid panel with patient, LDL still uncontrolled with high ASCVD risk, increase Crestor to 40. ?

## 2021-08-18 NOTE — Assessment & Plan Note (Signed)
No outbreaks in 1 year, refills ordered.  ?

## 2021-08-18 NOTE — Chronic Care Management (AMB) (Signed)
?  Care Management  ? ?Note ? ?08/18/2021 ?Name: Lauren [name] MRN: [id number] DOB: [birth year] ? ?Lauren [name] is a 46 y.o. year old female who is a primary care patient of [name], [place]. I reached out to [name] by phone today in response to a referral sent by Lauren [name]'s primary care provider.  ? ?Lauren [name] was given information about care management services today including:  ?Care management services include personalized support from designated clinical staff supervised by her physician, including individualized plan of care and coordination with other care providers ?24/7 contact phone numbers for assistance for urgent and routine care needs. ?The patient may stop care management services at any time by phone call to the office staff. ? ?Patient agreed to services and verbal consent obtained.  ? ?Follow up plan: ?Telephone appointment with care management team member scheduled for: 08/28/2021 ? ?Syniyah Bourne, CCMA ?Care Guide, Embedded Care Coordination ?[place]  Care Management  ?Direct Dial: [phone] ? ? ?

## 2021-08-18 NOTE — Patient Instructions (Signed)
It was great seeing you today! ? ?Plan discussed at today's visit: ?-Refills sent with increased cholesterol medication dose ?-Tdap and pneumonia vaccines today ?-Social worker referral placed, they will call you  ? ?Follow up in: 3 months  ? ?Take care and let us know if you have any questions or concerns prior to your next visit. ? ?Dr. Rosana Berger ? ?

## 2021-08-18 NOTE — Assessment & Plan Note (Signed)
Last A1c better controlled, refilled medications today, plan to recheck A1c in 3 months. ?

## 2021-08-18 NOTE — Progress Notes (Signed)
Established Patient Office Visit  Subjective:  Patient ID: Lauren [name], female    DOB: 03-12-1976  Age: 46 y.o. MRN: [id number]  CC:  Chief Complaint  Patient presents with   Diabetes   Hypertension   Hyperlipidemia    HPI Lauren [name] presents for follow up.   Diabetes, Type 2: -Last A1c : 12/22 7.2% -Medications: Januvia 100, Farxiga 10  -Patient is compliant with the above medications and reports no side effects.  -Checking BG at home: had been higher lately because had been on Prednisone for 10 days -Exercise: walks 0.5 mile a day -Eye exam: 1/23 -Foot exam: 9/22 -Microalbumin: 12/22 -Statin: yes -PNA vaccine: yes -Denies symptoms of hypoglycemia, polyuria, polydipsia, numbness extremities, foot ulcers/trauma.    Hypertension: -Medications: Lisinopril 20 -Patient is compliant with above medications and reports no side effects. -Checking BP at home (average): doesn't check at home -Denies any SOB, CP, vision changes, LE edema or symptoms of hypotension   HLD: -Medications: Crestor 20 restarted at last visit -Patient is compliant with above medications and reports no side effects.  -Last lipid panel: Lipid Panel     Component Value Date/Time   CHOL 210 (H) 05/19/2021 1151   CHOL 155 09/27/2015 0819   TRIG 148 05/19/2021 1151   HDL 28 (L) 05/19/2021 1151   HDL 32 (L) 09/27/2015 0819   CHOLHDL 7.5 (H) 05/19/2021 1151   VLDL 31 (H) 03/16/2016 1048   LDLCALC 155 (H) 05/19/2021 1151   LABVLDL 26 09/27/2015 0819   The 10-year ASCVD risk score (Arnett DK, et al., 2019) is: 20.1%   Values used to calculate the score:     Age: 48 years     Sex: Female     Is Non-Hispanic African American: No     Diabetic: Yes     Tobacco smoker: Yes     Systolic Blood Pressure: 025 mmHg     Is BP treated: Yes     HDL Cholesterol: 28 mg/dL     Total Cholesterol: 210 mg/dL    Elevated LFTs: -CMP 12/22: ALT 40, AST 35  Herpes Simplex: -Chronic, oral and genital -Last  flare about 1 year ago   Anxiety: -Cymbalta 30 mg BID -Interested in counseling   Health Maintenance: -Blood work up to date -Mammogram: last 2015, ordered but not yet scheduled  -Colonoscopy 2022, repeat in 5 years   Past Medical History:  Diagnosis Date   Allergic rhinitis    Anxiety    Chronic depression    Diabetes mellitus without complication (HCC)    Elevated LFTs    Genital herpes    GERD (gastroesophageal reflux disease)    Hyperlipidemia    Vitamin D deficiency     Past Surgical History:  Procedure Laterality Date   APPENDECTOMY     CESAREAN SECTION     x 4    COLONOSCOPY     COLONOSCOPY WITH PROPOFOL N/A 07/27/2020   Procedure: COLONOSCOPY WITH PROPOFOL;  Surgeon: Virgel Manifold, MD;  Location: [facility] END[facility];  Service: Endoscopy;  Laterality: N/A;  COVID POSITIVE 07/05/2020   HERNIA REPAIR  09/25/2011   TUBAL LIGATION     TYMPANOMASTOIDECTOMY Right 12/07/2015   Procedure: TYMPANOMASTOIDECTOMY;  Surgeon: Clyde Canterbury, MD;  Location: [facility] OR[facility];  Service: ENT;  Laterality: Right;    Family History  Problem Relation Age of Onset   Hypertension Mother    Hyperlipidemia Mother    Diabetes Mother    Arrhythmia Father 87  A-fib   Alcohol abuse Father    Heart disease Brother 94       stent x 1    Alcoholism Brother    Asthma Son    Asperger's syndrome Son     Social History   Socioeconomic History   Marital status: Single    Spouse name: Not on file   Number of children: 4   Years of education: Not on file   Highest education level: GED or equivalent  Occupational History    Employer: [employer]  Tobacco Use   Smoking status: Every Day    Packs/day: 1.00    Years: 28.00    Pack years: 28.00    Types: Cigarettes   Smokeless tobacco: Never  Vaping Use   Vaping Use: Never used  Substance and Sexual Activity   Alcohol use: Not Currently    Alcohol/week: 0.0 standard drinks   Drug use: No   Sexual activity: Yes    Partners: Male  Other  Topics Concern   Not on file  Social History Narrative   Not on file   Social Determinants of Health   Financial Resource Strain: Not on file  Food Insecurity: Not on file  Transportation Needs: Not on file  Physical Activity: Not on file  Stress: Not on file  Social Connections: Not on file  Intimate Partner Violence: Not on file    Outpatient Medications Prior to Visit  Medication Sig Dispense Refill   albuterol (PROVENTIL HFA) 108 (90 Base) MCG/ACT inhaler Inhale 2 puffs into the lungs every 6 (six) hours as needed for wheezing or shortness of breath. 18 g 2   dapagliflozin propanediol (FARXIGA) 10 MG TABS tablet Take 1 tablet (10 mg total) by mouth daily. 30 tablet 11   DULoxetine (CYMBALTA) 30 MG capsule Take 1 capsule (30 mg total) by mouth 2 (two) times daily. 180 capsule 3   lisinopril (ZESTRIL) 20 MG tablet Take 1 tablet (20 mg total) by mouth daily. 90 tablet 3   nicotine (NICODERM CQ - DOSED IN MG/24 HOURS) 14 mg/24hr patch PLACE 1 PATCH ONTO THE SKIN DAILY. 28 patch 3   rosuvastatin (CRESTOR) 20 MG tablet Take 1 tablet (20 mg total) by mouth at bedtime. 90 tablet 3   Semaglutide,0.25 or 0.5MG/DOS, (OZEMPIC, 0.25 OR 0.5 MG/DOSE,) 2 MG/1.5ML SOPN Inject 0.5 mg into the skin once a week. 4.5 mL 0   sitaGLIPtin (JANUVIA) 100 MG tablet Take 1 tablet (100 mg total) by mouth daily. 90 tablet 3   valACYclovir (VALTREX) 1000 MG tablet TAKE 1 TABLET BY MOUTH EVERY DAY 90 tablet 3   No facility-administered medications prior to visit.    Allergies  Allergen Reactions   Cephalexin    Metformin And Related Nausea And Vomiting    With all doses and regular and extended release forms    ROS Review of Systems  Constitutional:  Negative for chills and fever.  Eyes:  Negative for visual disturbance.  Respiratory:  Negative for cough and shortness of breath.   Cardiovascular:  Negative for chest pain.  Gastrointestinal:  Negative for abdominal pain.  Skin: Negative.    Neurological:  Negative for dizziness and headaches.     Objective:    Physical Exam Constitutional:      Appearance: Normal appearance.  HENT:     Head: Normocephalic and atraumatic.     Ears:     Comments: TM perf ? No drainage or signs of infection noted Eyes:  Conjunctiva/sclera: Conjunctivae normal.  Cardiovascular:     Rate and Rhythm: Normal rate and regular rhythm.  Pulmonary:     Effort: Pulmonary effort is normal.     Breath sounds: Normal breath sounds.  Musculoskeletal:     Right lower leg: No edema.     Left lower leg: No edema.  Skin:    General: Skin is warm and dry.  Neurological:     General: No focal deficit present.     Mental Status: She is alert. Mental status is at baseline.  Psychiatric:        Mood and Affect: Mood normal.        Behavior: Behavior normal.    There were no vitals taken for this visit. Wt Readings from Last 3 Encounters:  05/19/21 185 lb 11.2 oz (84.2 kg)  03/13/21 185 lb 14.4 oz (84.3 kg)  02/09/21 190 lb (86.2 kg)     Health Maintenance Due  Topic Date Due   MAMMOGRAM  05/27/2015   COVID-19 Vaccine (3 - Booster for Moderna series) 03/07/2020   OPHTHALMOLOGY EXAM  11/30/2020   TETANUS/TDAP  07/01/2021    There are no preventive care reminders to display for this patient.  Lab Results  Component Value Date   TSH 1.21 10/18/2016   Lab Results  Component Value Date   WBC 7.2 09/28/2020   HGB 14.0 09/28/2020   HCT 42.5 09/28/2020   MCV 88.9 09/28/2020   PLT 222 09/28/2020   Lab Results  Component Value Date   NA 137 05/19/2021   K 4.3 05/19/2021   CO2 26 05/19/2021   GLUCOSE 117 (H) 05/19/2021   BUN 9 05/19/2021   CREATININE 0.59 05/19/2021   BILITOT 0.4 05/19/2021   ALKPHOS 67 02/08/2018   AST 35 05/19/2021   ALT 40 (H) 05/19/2021   PROT 6.9 05/19/2021   ALBUMIN 4.6 02/08/2018   CALCIUM 9.3 05/19/2021   ANIONGAP 7 02/08/2018   EGFR 113 05/19/2021   Lab Results  Component Value Date   CHOL 210  (H) 05/19/2021   Lab Results  Component Value Date   HDL 28 (L) 05/19/2021   Lab Results  Component Value Date   LDLCALC 155 (H) 05/19/2021   Lab Results  Component Value Date   TRIG 148 05/19/2021   Lab Results  Component Value Date   CHOLHDL 7.5 (H) 05/19/2021   Lab Results  Component Value Date   HGBA1C 7.2 (H) 05/19/2021      Assessment & Plan:   Problem List Items Addressed This Visit       Cardiovascular and Mediastinum   Essential hypertension - Primary    Stable, continue current medications. Refills sent.       Relevant Medications   lisinopril (ZESTRIL) 20 MG tablet   rosuvastatin (CRESTOR) 40 MG tablet     Endocrine   Type 2 diabetes mellitus (HCC)    Last A1c better controlled, refilled medications today, plan to recheck A1c in 3 months.      Relevant Medications   dapagliflozin propanediol (FARXIGA) 10 MG TABS tablet   lisinopril (ZESTRIL) 20 MG tablet   sitaGLIPtin (JANUVIA) 100 MG tablet   rosuvastatin (CRESTOR) 40 MG tablet     Other   Hyperlipidemia    Reviewed lipid panel with patient, LDL still uncontrolled with high ASCVD risk, increase Crestor to 40.      Relevant Medications   lisinopril (ZESTRIL) 20 MG tablet   rosuvastatin (CRESTOR) 40 MG tablet  History of herpes genitalis    No outbreaks in 1 year, refills ordered.       Relevant Medications   valACYclovir (VALTREX) 1000 MG tablet   MDD (major depressive disorder)   Relevant Medications   rosuvastatin (CRESTOR) 40 MG tablet   DULoxetine (CYMBALTA) 30 MG capsule   Other Relevant Orders   AMB Referral to [place]   Morbid obesity ([place])   Relevant Medications   dapagliflozin propanediol (FARXIGA) 10 MG TABS tablet   sitaGLIPtin (JANUVIA) 100 MG tablet   Other Visit Diagnoses     Need for Tdap vaccination       Relevant Orders   Tdap vaccine greater than or equal to 7yo IM (Completed)   Hypertension, unspecified type       out of meds BP high  today, refill on lisinopril, was previously well controlled with 20 mg dose daily   Relevant Medications   lisinopril (ZESTRIL) 20 MG tablet   rosuvastatin (CRESTOR) 40 MG tablet   Vaccine for streptococcus pneumoniae and influenza       Relevant Orders   Pneumococcal conjugate vaccine 20-valent (Prevnar 20) (Completed)       Meds ordered this encounter  Medications   dapagliflozin propanediol (FARXIGA) 10 MG TABS tablet    Sig: Take 1 tablet (10 mg total) by mouth daily.    Dispense:  30 tablet    Refill:  11   lisinopril (ZESTRIL) 20 MG tablet    Sig: Take 1 tablet (20 mg total) by mouth daily.    Dispense:  90 tablet    Refill:  3   sitaGLIPtin (JANUVIA) 100 MG tablet    Sig: Take 1 tablet (100 mg total) by mouth daily.    Dispense:  90 tablet    Refill:  3   rosuvastatin (CRESTOR) 40 MG tablet    Sig: Take 1 tablet (40 mg total) by mouth daily.    Dispense:  90 tablet    Refill:  3   valACYclovir (VALTREX) 1000 MG tablet    Sig: Take 1 tablet (1,000 mg total) by mouth daily. Take as needed for herpes outbreak.    Dispense:  90 tablet    Refill:  3   DULoxetine (CYMBALTA) 30 MG capsule    Sig: Take 1 capsule (30 mg total) by mouth 2 (two) times daily.    Dispense:  180 capsule    Refill:  3    Follow-up: Return in about 3 months (around 11/18/2021).    [name], DO

## 2021-08-18 NOTE — Assessment & Plan Note (Signed)
Stable, continue current medications. Refills sent. ? ?

## 2021-08-28 ENCOUNTER — Encounter: Payer: Managed Care, Other (non HMO) | Admitting: *Deleted

## 2021-08-28 ENCOUNTER — Other Ambulatory Visit: Payer: Managed Care, Other (non HMO) | Admitting: *Deleted

## 2021-08-28 DIAGNOSIS — I10 Essential (primary) hypertension: Secondary | ICD-10-CM

## 2021-08-28 DIAGNOSIS — F3342 Major depressive disorder, recurrent, in full remission: Secondary | ICD-10-CM

## 2021-08-28 NOTE — Patient Instructions (Addendum)
?Visit Information ? ?Lauren [name] was given information about Medicaid Managed Care team care coordination services as a part of their [facility] Medicaid benefit. Lauren [name] verbally consented to engagement with the [facility] Managed Care team.  ? ?If you are experiencing a medical emergency, please call 911 or report to your local emergency department or urgent care.  ? ?If you have a non-emergency medical problem during routine business hours, please contact your provider's office and ask to speak with a nurse.  ? ?For questions related to your [facility] health plan, please call: [phone] or go here:[email or web address] ? ?If you would like to schedule transportation through your [facility] plan, please call the following number at least 2 days in advance of your appointment: [phone]. ? You can also use the MTM portal or MTM mobile app to manage your rides. For the portal, please go to [email or web address]. ? ?Call the [place] at [phone], at any time, 24 hours a day, 7 days a week. If you are in danger or need immediate medical attention call 911. ? ?If you would like help to quit smoking, call 1-800-QUIT-NOW 2670743239) OR Espa?ol: 1-855-D?jelo-Ya (304)015-1775) o para m?s informaci?n haga clic aqu? or Text READY to 200-400 to register via text ? ?Lauren [name] - following are the goals we discussed in your visit today:  ? Goals Addressed   ?None ?  ? ? ?Patient verbalizes understanding of instructions and care plan provided today and agrees to view in [place]. Active MyChart status confirmed with patient.   ? ?Lauren Petroleum, Lauren [name] ?Managed Medicaid Social Worker ?[phone] ? ? ?Following is a copy of your plan of care:  ?Care Plan : General Social Work (Adult)  ?Updates made by Lauren Claude, Lauren [name] since 08/28/2021 12:00 AM  ?  ? ?Problem: CHL AMB "PATIENT-SPECIFIC PROBLEM"   ?Note:   ?CARE PLAN ENTRY ?(see longitudinal plan of care for additional care  plan information) ? ?Current Barriers:  ?Knowledge deficits related to accessing mental health provider in patient with Depression and stress related to family conflicts and work stress  ?Patient is experiencing symptoms of  stress and overwhelm which seem to be exacerbated by uncertainty regarding her current employment and family struggles.     ?Patient needs Support, Education, and Care Coordination in order to meet unmet mental health needs  ?Mental Health Concerns  and Lacks knowledge of community resource: related to in-network mental health providers ? ?Clinical Social Work Goal(s):  ?Over the next 90 days, patient will work with SW monthly by telephone or in person to reduce or manage symptoms of depression and stress until connected for ongoing counseling resources.  ?Patient will implement clinical interventions discussed today to decreases symptoms of stress and increase knowledge and/or ability of: coping skills. ? ?Interventions:  ?Assessed patient's understanding, education, previous treatment and care coordination needs ?Patient discussed increased feelings of stress, depression and overwhelm due to family conflicts, financial difficulties and work stress ?Patient interviewed and appropriate assessments performed: PHQ 2 ?PHQ 9 ?Provided basic mental health support, education and interventions  ?Positive coping strategies discussed including proactive activities(preparing resume in the event search is needed for alternative employment, daily exercise and drives discussed)  ?Collaborated with appropriate clinical care team members regarding patient needs through co-signature ?Discussed options for long term counseling based on need and insurance, patient agreeable to ongoing outpatient mental health follow up ? ?Other interventions include: PHQ2/ PHQ9 completed ?Solution-Focused Strategies employed:  ?Active listening / Reflection utilized  ?Emotional Support  Provided ?Participation in counseling  encouraged   ? ?Patient Self Care Activities & Deficits:  ?Patient is unable to independently navigate community resource options without care coordination support ?Patient is able to implement clinical interventions discussed today and is motivated for treatment  ?Patient will select one of the agencies from the list provided and call to schedule an appointment  ?Performs ADL's independently ?Performs IADL's independently ?Motivation for treatment ? ?Initial goal documentation ? ?  ? ? ? ? ?Thank you for taking time to visit with me today. Please don't hesitate to contact me if I can be of assistance to you before our next scheduled telephone appointment. ? ?Following are the goals we discussed today:  ?- begin personal counseling ?- practice relaxation or meditation daily ?- start or continue a personal journal ?- talk about feelings with a friend, family or spiritual advisor ?- practice positive thinking and self-talk ? ?Our next appointment TBD ? ?Please call the care guide team at [phone] if you need to cancel or reschedule your appointment.  ? ?If you are experiencing a Mental Health or [place] or need someone to talk to, please call the Suicide and Crisis Lifeline: 988  ? ?Lauren [name] was given information about Care Management services by the embedded care coordination team including:  ?Care Management services include personalized support from designated clinical staff supervised by her physician, including individualized plan of care and coordination with other care providers ?24/7 contact phone numbers for assistance for urgent and routine care needs. ?The patient may stop CCM services at any time (effective at the end of the month) by phone call to the office staff. ? ?Patient agreed to services and verbal consent obtained.  ? ?Patient verbalizes understanding of instructions and care plan provided today and agrees to view in [place]. Active MyChart status confirmed with patient.    ? ?Telephone follow up appointment with care management team member to be scheduled by Careguide: ? ?Lauren [name], Lauren [name] ?Managed Medicaid Social Worker ?[phone] ? ? ?  ?

## 2021-08-28 NOTE — Progress Notes (Signed)
This encounter was created in error - please disregard.

## 2021-08-28 NOTE — Patient Outreach (Addendum)
?Medicaid Managed Care ?Social Work Note ? ?08/28/2021 ?Name:  Lauren [name] MRN:  [id number] DOB:  [birth year] ? ?Lauren [name] is an 46 y.o. year old female who is a primary patient of Lauren [name], [place].  [facility] Managed Care Coordination team was consulted for assistance with:  Mental Health Counseling and Resources ? ?[name] was given information about Medicaid Managed Care Coordination team services today. [name] Patient agreed to services and verbal consent obtained. ? ?Engaged with patient  for by telephone forinitial visit in response to referral for case management and/or care coordination services.  ? ?Assessments/Interventions:  Review of past medical history, allergies, medications, health status, including review of consultants reports, laboratory and other test data, was performed as part of comprehensive evaluation and provision of chronic care management services. ? ?SDOH: (Social Determinant of Health) assessments and interventions performed: ? ? ?Advanced Directives Status:  Not addressed in this encounter. ? ?Care Plan ?                ?Allergies  ?Allergen Reactions  ? Cephalexin   ? Metformin And Related Nausea And Vomiting  ?  With all doses and regular and extended release forms  ? ? ?Medications Reviewed Today   ? ? Reviewed by [name], DO (Physician) on 08/18/21 at [phone]  Med List Status: <None>  ? ?Medication Order Taking? Sig Documenting Provider Last Dose Status Informant  ?albuterol (PROVENTIL HFA) 108 (90 Base) MCG/ACT inhaler [id number] Yes Inhale 2 puffs into the lungs every 6 (six) hours as needed for wheezing or shortness of breath. Lauren Grana, PA-C Taking Active   ?dapagliflozin propanediol (FARXIGA) 10 MG TABS tablet [id number] Yes Take 1 tablet (10 mg total) by mouth daily. Lauren Grana, PA-C Taking Active Self  ?DULoxetine (CYMBALTA) 30 MG capsule [id number] Yes Take 1 capsule (30 mg total) by mouth 2 (two) times daily. Lauren Grana, PA-C Taking Active    ?lisinopril (ZESTRIL) 20 MG tablet [id number] Yes Take 1 tablet (20 mg total) by mouth daily. Lauren Grana, PA-C Taking Active   ?nicotine (NICODERM CQ - DOSED IN MG/24 HOURS) 14 mg/24hr patch [id number] Yes PLACE 1 PATCH ONTO THE SKIN DAILY. Lauren Grana, PA-C Taking Active   ?  Discontinued 12/03/18 1322   ?rosuvastatin (CRESTOR) 20 MG tablet [id number] Yes Take 1 tablet (20 mg total) by mouth at bedtime. Lauren Grana, PA-C Taking Active   ?sitaGLIPtin (JANUVIA) 100 MG tablet [id number] Yes Take 1 tablet (100 mg total) by mouth daily. Lauren Grana, PA-C Taking Active   ?valACYclovir (VALTREX) 1000 MG tablet [id number] Yes TAKE 1 TABLET BY MOUTH EVERY DAY Lauren Grana, PA-C Taking Active Self  ? ?  ?  ? ?  ? ? ?Patient Active Problem List  ? Diagnosis Date Noted  ? Type 2 diabetes mellitus ([place]) 08/18/2021  ? Cervical radiculopathy 08/04/2020  ? Encounter for screening colonoscopy   ? Polyp of transverse colon   ? Polyp of sigmoid colon   ? Essential hypertension 05/19/2018  ? History of vitamin D deficiency 05/18/2018  ? Tobacco dependence 05/18/2018  ? Morbid obesity ([place]) 01/13/2018  ? Allergic rhinitis, seasonal 04/23/2017  ? Genital herpes 04/23/2017  ? MDD (major depressive disorder) 05/17/2016  ? Bronchitis 03/16/2016  ? Cholesteatoma of attic of right ear 12/05/2015  ? Nocturnal cough 11/29/2015  ? GERD (gastroesophageal reflux disease) 11/29/2015  ? Acid reflux 09/19/2015  ? Screening for breast cancer 09/19/2015  ? Chronic pelvic pain in female 09/19/2015  ?  Vitamin D deficiency 12/13/2014  ? History of herpes genitalis 12/13/2014  ? Hyperlipidemia 05/26/2014  ? Smoker 05/26/2014  ? ? ?Conditions to be addressed/monitored per PCP order:  Anxiety, Depression, and stress ? ?Care Plan : General Social Work (Adult)  ?Updates made by [name], LCSW since 08/28/2021 12:00 AM  ?  ? ?Problem: CHL AMB "PATIENT-SPECIFIC PROBLEM"   ?Note:   ?CARE PLAN ENTRY ?(see longitudinal plan of care for additional care plan  information) ? ?Current Barriers:  ?Knowledge deficits related to accessing mental health provider in patient with Depression and stress related to family conflicts and work stress  ?Patient is experiencing symptoms of  stress and overwhelm which seem to be exacerbated by uncertainty regarding her current employment and family struggles.     ?Patient needs Support, Education, and Care Coordination in order to meet unmet mental health needs  ?Mental Health Concerns  and Lacks knowledge of community resource: related to in-network mental health providers ? ?Clinical Social Work Goal(s):  ?Over the next 90 days, patient will work with SW monthly by telephone or in person to reduce or manage symptoms of depression and stress until connected for ongoing counseling resources.  ?Patient will implement clinical interventions discussed today to decreases symptoms of stress and increase knowledge and/or ability of: coping skills. ? ?Interventions:  ?Assessed patient's understanding, education, previous treatment and care coordination needs ?Patient discussed increased feelings of stress, depression and overwhelm due to family conflicts, financial difficulties and work stress ?Patient interviewed and appropriate assessments performed: PHQ 2 ?PHQ 9 ?Provided basic mental health support, education and interventions  ?Positive coping strategies discussed including proactive activities(preparing resume in the event search is needed for alternative employment, daily exercise and drives discussed)  ?Collaborated with appropriate clinical care team members regarding patient needs through co-signature ?Discussed options for long term counseling based on need and insurance, patient agreeable to ongoing outpatient mental health follow up ? ?Other interventions include: PHQ2/ PHQ9 completed ?Solution-Focused Strategies employed:  ?Active listening / Reflection utilized  ?Emotional Support Provided ?Participation in counseling encouraged    ? ?Patient Self Care Activities & Deficits:  ?Patient is unable to independently navigate community resource options without care coordination support ?Patient is able to implement clinical interventions discussed today and is motivated for treatment  ?Patient will select one of the agencies from the list provided and call to schedule an appointment  ?Performs ADL's independently ?Performs IADL's independently ?Motivation for treatment ? ?Initial goal documentation ? ?  ? ? ?Follow up:  Patient agrees to Care Plan and Follow-up. ? ?Plan: The Managed Medicaid care management team will reach out to the patient again over the next 30 buisness days. ? ?Date/time of next scheduled Social Work care management/care coordination outreach:  to be scheduled by [place] ? ?Gilberto Stanforth, LCSW ?Managed Medicaid Social Worker ?[phone] ? ?

## 2021-08-28 NOTE — Patient Instructions (Signed)
Visit Information ? ?Thank you for taking time to visit with me today. Please don't hesitate to contact me if I can be of assistance to you before our next scheduled telephone appointment. ? ?Following are the goals we discussed today:  ?- begin personal counseling ?- practice relaxation or meditation daily ?- start or continue a personal journal ?- talk about feelings with a friend, family or spiritual advisor ?- practice positive thinking and self-talk ? ?Our next appointment to be scheduled with Managed Medicaid Social Worker ? ?Please call the care guide team at [phone] if you need to cancel or reschedule your appointment.  ? ?If you are experiencing a Mental Health or [place] or need someone to talk to, please call the Suicide and Crisis Lifeline: 988  ? ?Following is a copy of your full plan of care:  ?Care Plan : General Social Work (Adult)  ?Updates made by [name], LCSW since 08/28/2021 12:00 AM  ?  ? ?Problem: CHL AMB "PATIENT-SPECIFIC PROBLEM"   ?Note:   ?CARE PLAN ENTRY ?(see longitudinal plan of care for additional care plan information) ? ?Current Barriers:  ?Knowledge deficits related to accessing mental health provider in patient with Anxiety and stress related to family conflicts and work stress   ?Patient is experiencing symptoms of  stress and overwhelm which seem to be exacerbated by uncertainty regarding her current employment and family struggles.     ?Patient needs Support, Education, and Care Coordination in order to meet unmet mental health needs  ?Mental Health Concerns  and Lacks knowledge of community resource: related to in-network mental health providers ? ?Clinical Social Work Goal(s):  ?Over the next 90 days, patient will work with SW monthly by telephone or in person to reduce or manage symptoms of anxiety and stress until connected for ongoing counseling resources.  ?Patient will implement clinical interventions discussed today to decreases symptoms of  stress and increase knowledge and/or ability of: coping skills. ? ?Interventions:  ?Assessed patient's understanding, education, previous treatment and care coordination needs ?Patient discussed increased feelings of stress, anxiety and overwhelm due to family conflicts, financial difficulties and work stress ?Patient interviewed and appropriate assessments performed: PHQ 2 ?PHQ 9 ?Provided basic mental health support, education and interventions  ?Positive coping strategies discussed including proactive activities(preparing resume in the event search is needed for alternative employment, daily exercise and drives discussed)  ?Collaborated with appropriate clinical care team members regarding patient needs through co-signature ?Discussed options for long term counseling based on need and insurance, patient agreeable to ongoing outpatient mental health follow up ? ?Other interventions include: PHQ2/ PHQ9 completed ?Solution-Focused Strategies employed:  ?Active listening / Reflection utilized  ?Emotional Support Provided ?Participation in counseling encouraged   ? ?Patient Self Care Activities & Deficits:  ?Patient is unable to independently navigate community resource options without care coordination support ?Patient is able to implement clinical interventions discussed today and is motivated for treatment  ?Patient will select one of the agencies from the list provided and call to schedule an appointment  ?Performs ADL's independently ?Performs IADL's independently ?Motivation for treatment ? ?Initial goal documentation ? ?  ? ? ?Ms. Caldeira was given information about Care Management services by the embedded care coordination team including:  ?Care Management services include personalized support from designated clinical staff supervised by her physician, including individualized plan of care and coordination with other care providers ?24/7 contact phone numbers for assistance for urgent and routine care needs. ?The  patient may stop CCM  services at any time (effective at the end of the month) by phone call to the office staff. ? ?Patient agreed to services and verbal consent obtained.  ? ?Patient verbalizes understanding of instructions and care plan provided today and agrees to view in [place]. Active MyChart status confirmed with patient.   ? ?Telephone follow up appointment to be scheduled with managed medicaid social worker  ? ?Calleen Alvis, LCSW ?Managed Medicaid Social Worker ?[phone] ? ? ?  ?

## 2021-08-28 NOTE — Chronic Care Management (AMB) (Signed)
Care Management ?Clinical Social Work Note ? ?08/28/2021 ?Name: Lauren [name] MRN: [id number] DOB: 1976/05/16 ? ?Lauren [name] is a 46 y.o. year old female who is a primary care patient of Lauren [name], [place].  The Care Management team was consulted for assistance with chronic disease management and coordination needs. ? ?Engaged with patient by telephone for initial visit in response to provider referral for social work chronic care management and care coordination services ? ?Consent to Services:  ?Lauren [name] was given information about Care Management services today including:  ?Care Management services includes personalized support from designated clinical staff supervised by her physician, including individualized plan of care and coordination with other care providers ?24/7 contact phone numbers for assistance for urgent and routine care needs. ?The patient may stop case management services at any time by phone call to the office staff. ? ?Patient agreed to services and consent obtained.  ? ?Assessment: Review of patient past medical history, allergies, medications, and health status, including review of relevant consultants reports was performed today as part of a comprehensive evaluation and provision of chronic care management and care coordination services. ? ?SDOH (Social Determinants of Health) assessments and interventions performed:   ? ?Advanced Directives Status: Not addressed in this encounter. ? ?Care Plan ? ?Allergies  ?Allergen Reactions  ? Cephalexin   ? Metformin And Related Nausea And Vomiting  ?  With all doses and regular and extended release forms  ? ? ?Outpatient Encounter Medications as of 08/28/2021  ?Medication Sig  ? albuterol (PROVENTIL HFA) 108 (90 Base) MCG/ACT inhaler Inhale 2 puffs into the lungs every 6 (six) hours as needed for wheezing or shortness of breath.  ? dapagliflozin propanediol (FARXIGA) 10 MG TABS tablet Take 1 tablet (10 mg total) by mouth daily.  ? DULoxetine (CYMBALTA)  30 MG capsule Take 1 capsule (30 mg total) by mouth 2 (two) times daily.  ? lisinopril (ZESTRIL) 20 MG tablet Take 1 tablet (20 mg total) by mouth daily.  ? nicotine (NICODERM CQ - DOSED IN MG/24 HOURS) 14 mg/24hr patch PLACE 1 PATCH ONTO THE SKIN DAILY.  ? rosuvastatin (CRESTOR) 40 MG tablet Take 1 tablet (40 mg total) by mouth daily.  ? sitaGLIPtin (JANUVIA) 100 MG tablet Take 1 tablet (100 mg total) by mouth daily.  ? valACYclovir (VALTREX) 1000 MG tablet Take 1 tablet (1,000 mg total) by mouth daily. Take as needed for herpes outbreak.  ? [DISCONTINUED] ranitidine (ZANTAC) 150 MG tablet Take 1 tablet (150 mg total) by mouth at bedtime.  ? ?No facility-administered encounter medications on file as of 08/28/2021.  ? ? ?Patient Active Problem List  ? Diagnosis Date Noted  ? Type 2 diabetes mellitus ([place]) 08/18/2021  ? Cervical radiculopathy 08/04/2020  ? Encounter for screening colonoscopy   ? Polyp of transverse colon   ? Polyp of sigmoid colon   ? Essential hypertension 05/19/2018  ? History of vitamin D deficiency 05/18/2018  ? Tobacco dependence 05/18/2018  ? Morbid obesity ([place]) 01/13/2018  ? Allergic rhinitis, seasonal 04/23/2017  ? Genital herpes 04/23/2017  ? MDD (major depressive disorder) 05/17/2016  ? Bronchitis 03/16/2016  ? Cholesteatoma of attic of right ear 12/05/2015  ? Nocturnal cough 11/29/2015  ? GERD (gastroesophageal reflux disease) 11/29/2015  ? Acid reflux 09/19/2015  ? Screening for breast cancer 09/19/2015  ? Chronic pelvic pain in female 09/19/2015  ? Vitamin D deficiency 12/13/2014  ? History of herpes genitalis 12/13/2014  ? Hyperlipidemia 05/26/2014  ? Smoker 05/26/2014  ? ? ?  Conditions to be addressed/monitored: Anxiety and stress ; Mental Health Concerns  ? ?Care Plan : General Social Work (Adult)  ?Updates made by Lauren Claude, LCSW since 08/28/2021 12:00 AM  ?  ? ?Problem: [facility] AMB "PATIENT-SPECIFIC PROBLEM"   ?Note:   ?CARE PLAN ENTRY ?(see longitudinal plan of care for additional  care plan information) ? ?Current Barriers:  ?Knowledge deficits related to accessing mental health provider in patient with Anxiety and stress related to family conflicts and work stress   ?Patient is experiencing symptoms of  stress and overwhelm which seem to be exacerbated by uncertainty regarding her current employment and family struggles.     ?Patient needs Support, Education, and Care Coordination in order to meet unmet mental health needs  ?Mental Health Concerns  and Lacks knowledge of community resource: related to in-network mental health providers ? ?Clinical Social Work Goal(s):  ?Over the next 90 days, patient will work with SW monthly by telephone or in person to reduce or manage symptoms of anxiety and stress until connected for ongoing counseling resources.  ?Patient will implement clinical interventions discussed today to decreases symptoms of stress and increase knowledge and/or ability of: coping skills. ? ?Interventions:  ?Assessed patient's understanding, education, previous treatment and care coordination needs ?Patient discussed increased feelings of stress, anxiety and overwhelm due to family conflicts, financial difficulties and work stress ?Patient interviewed and appropriate assessments performed: PHQ 2 ?PHQ 9 ?Provided basic mental health support, education and interventions  ?Positive coping strategies discussed including proactive activities(preparing resume in the event search is needed for alternative employment, daily exercise and drives discussed)  ?Collaborated with appropriate clinical care team members regarding patient needs through co-signature ?Discussed options for long term counseling based on need and insurance, patient agreeable to ongoing outpatient mental health follow up ? ?Other interventions include: PHQ2/ PHQ9 completed ?Solution-Focused Strategies employed:  ?Active listening / Reflection utilized  ?Emotional Support Provided ?Participation in counseling  encouraged   ? ?Patient Self Care Activities & Deficits:  ?Patient is unable to independently navigate community resource options without care coordination support ?Patient is able to implement clinical interventions discussed today and is motivated for treatment  ?Patient will select one of the agencies from the list provided and call to schedule an appointment  ?Performs ADL's independently ?Performs IADL's independently ?Motivation for treatment ? ?Initial goal documentation ? ?  ?  ? ?Follow Up Plan: SW will follow up with patient by phone over the next 30 business days ? ?Dreama Kuna, LCSW ?Managed Medicaid Social Worker ?[phone] ? ?

## 2021-09-16 DIAGNOSIS — Z419 Encounter for procedure for purposes other than remedying health state, unspecified: Secondary | ICD-10-CM | POA: Diagnosis not present

## 2021-09-28 ENCOUNTER — Ambulatory Visit (INDEPENDENT_AMBULATORY_CARE_PROVIDER_SITE_OTHER): Payer: Managed Care, Other (non HMO) | Admitting: Family Medicine

## 2021-09-28 ENCOUNTER — Encounter: Payer: Self-pay | Admitting: Family Medicine

## 2021-09-28 VITALS — BP 138/82 | HR 94 | Temp 98.6°F | Resp 16 | Ht 62.0 in | Wt 182.7 lb

## 2021-09-28 DIAGNOSIS — H60502 Unspecified acute noninfective otitis externa, left ear: Secondary | ICD-10-CM | POA: Diagnosis not present

## 2021-09-28 MED ORDER — FLUCONAZOLE 150 MG PO TABS: 150.0000 mg | ORAL_TABLET | ORAL | 0 refills | Status: DC | PRN

## 2021-09-28 MED ORDER — CIPROFLOXACIN-DEXAMETHASONE 0.3-0.1 % OT SUSP: 4.0000 [drp] | Freq: Two times a day (BID) | OTIC | 0 refills | Status: DC

## 2021-09-28 MED ORDER — AMOXICILLIN-POT CLAVULANATE 875-125 MG PO TABS: 1.0000 | ORAL_TABLET | Freq: Two times a day (BID) | ORAL | 0 refills | Status: AC

## 2021-09-28 NOTE — Progress Notes (Signed)
? ? ?Patient ID: Lauren [name], female    DOB: 08/05/1975, 47 y.o.   MRN: [id number] ? ?PCP: [name], PA-C ? ?Chief Complaint  ?Patient presents with  ? Ear Pain  ?  Left ear started yesterday, pt believes it may be related to sinuses.  ? ? ?Subjective:  ? ?Lauren [name] is a 46 y.o. female, presents to clinic with CC of the following: ? ?HPI  ?Patient presents for about 1 day of acute and severe left ear pain.  Started yesterday, she is barely able to sleep last night she has tried Tylenol and ibuprofen.  She does have a history of eustachian tube dysfunction, serous effusion in her left ear, right ear TM repair with some complications and cerumen impaction.  She states she has not put anything in her ears.  She does have nasal drainage and postnasal drip that she suspects is allergies, she reports tends to have the symptoms chronically.  She denies any sore throat, fever, chills, facial pain, sinus pain or pressure, headaches.  She is having mild nonproductive cough ?Her left ear is very tender around it and if manipulated she has not had any drainage and no change to her hearing. ?She has seen Dr. Pryor Ochoa and Dr. Richardson Landry at [facility] and she is currently using intranasal steroid spray to manage her eustachian tube dysfunction.  This current acute pain does not feel anything like her symptoms that shes seen them for ? ?Patient Active Problem List  ? Diagnosis Date Noted  ? Type 2 diabetes mellitus ([place]) 08/18/2021  ? Cervical radiculopathy 08/04/2020  ? Encounter for screening colonoscopy   ? Polyp of transverse colon   ? Polyp of sigmoid colon   ? Essential hypertension 05/19/2018  ? History of vitamin D deficiency 05/18/2018  ? Tobacco dependence 05/18/2018  ? Morbid obesity ([place]) 01/13/2018  ? Allergic rhinitis, seasonal 04/23/2017  ? Genital herpes 04/23/2017  ? MDD (major depressive disorder) 05/17/2016  ? Bronchitis 03/16/2016  ? Cholesteatoma of attic of right ear 12/05/2015  ? Nocturnal cough  11/29/2015  ? GERD (gastroesophageal reflux disease) 11/29/2015  ? Acid reflux 09/19/2015  ? Screening for breast cancer 09/19/2015  ? Chronic pelvic pain in female 09/19/2015  ? Vitamin D deficiency 12/13/2014  ? History of herpes genitalis 12/13/2014  ? Hyperlipidemia 05/26/2014  ? Smoker 05/26/2014  ? ? ? ? ?Current Outpatient Medications:  ?  albuterol (PROVENTIL HFA) 108 (90 Base) MCG/ACT inhaler, Inhale 2 puffs into the lungs every 6 (six) hours as needed for wheezing or shortness of breath., Disp: 18 g, Rfl: 2 ?  ciprofloxacin-dexamethasone (CIPRODEX) OTIC suspension, Place 4 drops into the left ear 2 (two) times daily., Disp: 7.5 mL, Rfl: 0 ?  dapagliflozin propanediol (FARXIGA) 10 MG TABS tablet, Take 1 tablet (10 mg total) by mouth daily., Disp: 30 tablet, Rfl: 11 ?  DULoxetine (CYMBALTA) 30 MG capsule, Take 1 capsule (30 mg total) by mouth 2 (two) times daily., Disp: 180 capsule, Rfl: 3 ?  fluticasone (FLONASE) 50 MCG/ACT nasal spray, Place 2 sprays into both nostrils daily., Disp: , Rfl:  ?  lisinopril (ZESTRIL) 20 MG tablet, Take 1 tablet (20 mg total) by mouth daily., Disp: 90 tablet, Rfl: 3 ?  nicotine (NICODERM CQ - DOSED IN MG/24 HOURS) 14 mg/24hr patch, PLACE 1 PATCH ONTO THE SKIN DAILY., Disp: 28 patch, Rfl: 3 ?  rosuvastatin (CRESTOR) 40 MG tablet, Take 1 tablet (40 mg total) by mouth daily., Disp: 90 tablet, Rfl:  3 ?  sitaGLIPtin (JANUVIA) 100 MG tablet, Take 1 tablet (100 mg total) by mouth daily., Disp: 90 tablet, Rfl: 3 ?  valACYclovir (VALTREX) 1000 MG tablet, Take 1 tablet (1,000 mg total) by mouth daily. Take as needed for herpes outbreak., Disp: 90 tablet, Rfl: 3 ? ? ?Allergies  ?Allergen Reactions  ? Cephalexin   ? Metformin And Related Nausea And Vomiting  ?  With all doses and regular and extended release forms  ? ? ? ?Social History  ? ?Tobacco Use  ? Smoking status: Every Day  ?  Packs/day: 1.00  ?  Years: 28.00  ?  Pack years: 28.00  ?  Types: Cigarettes  ? Smokeless tobacco: Never   ?Vaping Use  ? Vaping Use: Never used  ?Substance Use Topics  ? Alcohol use: Not Currently  ?  Alcohol/week: 0.0 standard drinks  ? Drug use: No  ?  ? ? ?Chart Review Today: ?I personally reviewed active problem list, medication list, allergies, family history, social history, health maintenance, notes from last encounter, lab results, imaging with the patient/caregiver today. ? ? ?Review of Systems  ?Constitutional: Negative.   ?HENT: Negative.    ?Eyes: Negative.   ?Respiratory: Negative.    ?Cardiovascular: Negative.   ?Gastrointestinal: Negative.   ?Endocrine: Negative.   ?Genitourinary: Negative.   ?Musculoskeletal: Negative.   ?Skin: Negative.   ?Allergic/Immunologic: Negative.   ?Neurological: Negative.   ?Hematological: Negative.   ?Psychiatric/Behavioral: Negative.    ?All other systems reviewed and are negative. ? ?   ?Objective:  ? ?Vitals:  ? 09/28/21 1013 09/28/21 1022  ?BP: (!) 144/82 138/82  ?Pulse: 94   ?Resp: 16   ?Temp: 98.6 ?F (37 ?C)   ?TempSrc: Oral   ?SpO2: 98%   ?Weight: 182 lb 11.2 oz (82.9 kg)   ?Height: '5\' 2"'$  (1.575 m)   ?  ?Body mass index is 33.42 kg/m?. ? ?Physical Exam ?Vitals and nursing note reviewed.  ?Constitutional:   ?   General: She is not in acute distress. ?   Appearance: She is well-developed. She is not ill-appearing, toxic-appearing or diaphoretic.  ?HENT:  ?   Head: Normocephalic and atraumatic. No right periorbital erythema or left periorbital erythema.  ?   Jaw: There is normal jaw occlusion.  ?   Salivary Glands: Right salivary gland is not diffusely enlarged or tender. Left salivary gland is not diffusely enlarged or tender.  ?   Right Ear: Ear canal and external ear normal. No laceration, drainage, swelling or tenderness. No middle ear effusion. There is no impacted cerumen. Tympanic membrane is scarred and perforated.  ?   Left Ear: External ear normal. Tenderness present. No drainage.  No middle ear effusion. There is no impacted cerumen. No mastoid tenderness.  ?    Ears:  ?   Comments: Left TM intact, visible landmarks, no effusion noted, no purulence, no bulging, the canal adjacent to the edges of the TM are extremely swollen and erythematous, no drainage, no cerumen present ?Left ear she has mild pinna and tragus tenderness to palpation ?Mild preauricular lymphadenopathy ?   Nose: Mucosal edema, congestion and rhinorrhea present. Rhinorrhea is clear.  ?   Right Sinus: No maxillary sinus tenderness or frontal sinus tenderness.  ?   Left Sinus: No maxillary sinus tenderness or frontal sinus tenderness.  ?   Mouth/Throat:  ?   Mouth: Mucous membranes are moist.  ?   Pharynx: Uvula midline. Posterior oropharyngeal erythema present. No pharyngeal swelling, oropharyngeal exudate  or uvula swelling.  ?   Tonsils: No tonsillar exudate.  ?Eyes:  ?   General:     ?   Right eye: No discharge.     ?   Left eye: No discharge.  ?   Conjunctiva/sclera: Conjunctivae normal.  ?Neck:  ?   Trachea: No tracheal deviation.  ?Cardiovascular:  ?   Rate and Rhythm: Normal rate and regular rhythm.  ?Pulmonary:  ?   Effort: Pulmonary effort is normal. No respiratory distress.  ?   Breath sounds: No stridor.  ?Musculoskeletal:     ?   General: Normal range of motion.  ?Skin: ?   General: Skin is warm and dry.  ?   Findings: No rash.  ?Neurological:  ?   Mental Status: She is alert.  ?   Motor: No abnormal muscle tone.  ?   Coordination: Coordination normal.  ?Psychiatric:     ?   Behavior: Behavior normal.  ?  ? ?Results for orders placed or performed in visit on 08/21/21  ?HM DIABETES EYE EXAM  ?Result Value Ref Range  ? HM Diabetic Eye Exam Retinopathy (A) No Retinopathy  ? ? ?   ?Assessment & Plan:  ? ?  ICD-10-CM   ?1. Acute otitis externa of left ear, unspecified type  H60.502 ciprofloxacin-dexamethasone (CIPRODEX) OTIC suspension  ?  ?Patient has a complicated ENT history she presents with 1 day acute left otalgia, very severe, appears to be acute otitis externa, without any acute otitis media.   She denies any instrumentation inside her canal.  Cover with Ciprodex, because of her complicated HEENT history if no improvement within 48 hours she could add the oral antibiotic as double coverage due to

## 2021-10-16 DIAGNOSIS — Z419 Encounter for procedure for purposes other than remedying health state, unspecified: Secondary | ICD-10-CM | POA: Diagnosis not present

## 2021-10-17 ENCOUNTER — Ambulatory Visit
Admission: RE | Admit: 2021-10-17 | Discharge: 2021-10-17 | Disposition: A | Discharge status: Home / Self Care | Payer: No Typology Code available for payment source | Source: Ambulatory Visit | Attending: Nurse Practitioner | Admitting: Nurse Practitioner

## 2021-10-17 ENCOUNTER — Other Ambulatory Visit: Payer: Self-pay | Admitting: Nurse Practitioner

## 2021-10-17 DIAGNOSIS — M542 Cervicalgia: Secondary | ICD-10-CM

## 2021-11-16 DIAGNOSIS — Z419 Encounter for procedure for purposes other than remedying health state, unspecified: Secondary | ICD-10-CM | POA: Diagnosis not present

## 2021-11-16 NOTE — Progress Notes (Unsigned)
Established Patient Office Visit  Subjective:  Patient ID: Lauren [name], female    DOB: 1975/11/05  Age: 46 y.o. MRN: [id number]  CC:  No chief complaint on file.   HPI [name] presents for follow up.   Diabetes, Type 2: -Last A1c : 12/22 7.2% -Medications: Januvia 100, Farxiga 10  -Patient is compliant with the above medications and reports no side effects.  -Checking BG at home: had been higher lately because had been on Prednisone for 10 days -Exercise: walks 0.5 mile a day -Eye exam: 1/23 -Foot exam: 9/22 -Microalbumin: 12/22 -Statin: yes -PNA vaccine: yes -Denies symptoms of hypoglycemia, polyuria, polydipsia, numbness extremities, foot ulcers/trauma.    Hypertension: -Medications: Lisinopril 20 -Patient is compliant with above medications and reports no side effects. -Checking BP at home (average): doesn't check at home -Denies any SOB, CP, vision changes, LE edema or symptoms of hypotension   HLD: -Medications: Crestor increased to 40 mg  -Patient is compliant with above medications and reports no side effects.  -Last lipid panel: Lipid Panel     Component Value Date/Time   CHOL 210 (H) 05/19/2021 1151   CHOL 155 09/27/2015 0819   TRIG 148 05/19/2021 1151   HDL 28 (L) 05/19/2021 1151   HDL 32 (L) 09/27/2015 0819   CHOLHDL 7.5 (H) 05/19/2021 1151   VLDL 31 (H) 03/16/2016 1048   LDLCALC 155 (H) 05/19/2021 1151   LABVLDL 26 09/27/2015 0819   The 10-year ASCVD risk score (Arnett DK, et al., 2019) is: 23.7%   Values used to calculate the score:     Age: 78 years     Sex: Female     Is Non-Hispanic African American: No     Diabetic: Yes     Tobacco smoker: Yes     Systolic Blood Pressure: 131 mmHg     Is BP treated: Yes     HDL Cholesterol: 28 mg/dL     Total Cholesterol: 210 mg/dL    Elevated LFTs: -CMP 12/22: ALT 40, AST 35  Herpes Simplex: -Chronic, oral and genital -Last flare about 1 year ago   Anxiety: -Cymbalta 30 mg  BID -Interested in counseling   Health Maintenance: -Blood work up to date -Mammogram: last 2015, ordered but not yet scheduled ??? -Colonoscopy 2022, repeat in 5 years   Past Medical History:  Diagnosis Date   Allergic rhinitis    Anxiety    Chronic depression    Diabetes mellitus without complication (HCC)    Elevated LFTs    Genital herpes    GERD (gastroesophageal reflux disease)    Hyperlipidemia    Vitamin D deficiency     Past Surgical History:  Procedure Laterality Date   APPENDECTOMY     CESAREAN SECTION     x 4    COLONOSCOPY     COLONOSCOPY WITH PROPOFOL N/A 07/27/2020   Procedure: COLONOSCOPY WITH PROPOFOL;  Surgeon: Virgel Manifold, MD;  Location: [facility];  Service: Endoscopy;  Laterality: N/A;  COVID POSITIVE 07/05/2020   HERNIA REPAIR  09/25/2011   TUBAL LIGATION     TYMPANOMASTOIDECTOMY Right 12/07/2015   Procedure: TYMPANOMASTOIDECTOMY;  Surgeon: Clyde Canterbury, MD;  Location: [facility] ORS;  Service: ENT;  Laterality: Right;    Family History  Problem Relation Age of Onset   Hypertension Mother    Hyperlipidemia Mother    Diabetes Mother    Arrhythmia Father 47       A-fib   Alcohol abuse Father  Heart disease Brother 13       stent x 1    Alcoholism Brother    Asthma Son    Asperger's syndrome Son     Social History   Socioeconomic History   Marital status: Single    Spouse name: Not on file   Number of children: 4   Years of education: Not on file   Highest education level: GED or equivalent  Occupational History    Employer: [employer]  Tobacco Use   Smoking status: Every Day    Packs/day: 1.00    Years: 28.00    Pack years: 28.00    Types: Cigarettes   Smokeless tobacco: Never  Vaping Use   Vaping Use: Never used  Substance and Sexual Activity   Alcohol use: Not Currently    Alcohol/week: 0.0 standard drinks   Drug use: No   Sexual activity: Yes    Partners: Male  Other Topics Concern   Not on file  Social History  Narrative   Not on file   Social Determinants of Health   Financial Resource Strain: Not on file  Food Insecurity: No Food Insecurity   Worried About Charity fundraiser in the Last Year: Never true   Ran Out of Food in the Last Year: Never true  Transportation Needs: Not on file  Physical Activity: Not on file  Stress: Not on file  Social Connections: Unknown   Frequency of Communication with Friends and Family: Not on file   Frequency of Social Gatherings with Friends and Family: Not on file   Attends Religious Services: Never   Marine scientist or Organizations: No   Attends Archivist Meetings: Never   Marital Status: Never married  Human resources officer Violence: Not on file    Outpatient Medications Prior to Visit  Medication Sig Dispense Refill   albuterol (PROVENTIL HFA) 108 (90 Base) MCG/ACT inhaler Inhale 2 puffs into the lungs every 6 (six) hours as needed for wheezing or shortness of breath. 18 g 2   ciprofloxacin-dexamethasone (CIPRODEX) OTIC suspension Place 4 drops into the left ear 2 (two) times daily. 7.5 mL 0   dapagliflozin propanediol (FARXIGA) 10 MG TABS tablet Take 1 tablet (10 mg total) by mouth daily. 30 tablet 11   DULoxetine (CYMBALTA) 30 MG capsule Take 1 capsule (30 mg total) by mouth 2 (two) times daily. 180 capsule 3   fluconazole (DIFLUCAN) 150 MG tablet Take 1 tablet (150 mg total) by mouth every 3 (three) days as needed (for vaginal itching/yeast infection sx). 2 tablet 0   fluticasone (FLONASE) 50 MCG/ACT nasal spray Place 2 sprays into both nostrils daily.     lisinopril (ZESTRIL) 20 MG tablet Take 1 tablet (20 mg total) by mouth daily. 90 tablet 3   nicotine (NICODERM CQ - DOSED IN MG/24 HOURS) 14 mg/24hr patch PLACE 1 PATCH ONTO THE SKIN DAILY. 28 patch 3   rosuvastatin (CRESTOR) 40 MG tablet Take 1 tablet (40 mg total) by mouth daily. 90 tablet 3   sitaGLIPtin (JANUVIA) 100 MG tablet Take 1 tablet (100 mg total) by mouth daily. 90  tablet 3   valACYclovir (VALTREX) 1000 MG tablet Take 1 tablet (1,000 mg total) by mouth daily. Take as needed for herpes outbreak. 90 tablet 3   No facility-administered medications prior to visit.    Allergies  Allergen Reactions   Cephalexin Other (See Comments)    Secondary yeast infection   Metformin And Related Nausea And Vomiting  With all doses and regular and extended release forms    ROS Review of Systems  Constitutional:  Negative for chills and fever.  Eyes:  Negative for visual disturbance.  Respiratory:  Negative for cough and shortness of breath.   Cardiovascular:  Negative for chest pain.  Gastrointestinal:  Negative for abdominal pain.  Skin: Negative.   Neurological:  Negative for dizziness and headaches.     Objective:    Physical Exam Constitutional:      Appearance: Normal appearance.  HENT:     Head: Normocephalic and atraumatic.     Ears:     Comments: TM perf ? No drainage or signs of infection noted Eyes:     Conjunctiva/sclera: Conjunctivae normal.  Cardiovascular:     Rate and Rhythm: Normal rate and regular rhythm.  Pulmonary:     Effort: Pulmonary effort is normal.     Breath sounds: Normal breath sounds.  Musculoskeletal:     Right lower leg: No edema.     Left lower leg: No edema.  Skin:    General: Skin is warm and dry.  Neurological:     General: No focal deficit present.     Mental Status: She is alert. Mental status is at baseline.  Psychiatric:        Mood and Affect: Mood normal.        Behavior: Behavior normal.    There were no vitals taken for this visit. Wt Readings from Last 3 Encounters:  09/28/21 182 lb 11.2 oz (82.9 kg)  08/18/21 186 lb 8 oz (84.6 kg)  05/19/21 185 lb 11.2 oz (84.2 kg)     Health Maintenance Due  Topic Date Due   MAMMOGRAM  05/27/2015   COVID-19 Vaccine (3 - Booster for Moderna series) 03/07/2020    There are no preventive care reminders to display for this patient.  Lab Results   Component Value Date   TSH 1.21 10/18/2016   Lab Results  Component Value Date   WBC 7.2 09/28/2020   HGB 14.0 09/28/2020   HCT 42.5 09/28/2020   MCV 88.9 09/28/2020   PLT 222 09/28/2020   Lab Results  Component Value Date   NA 137 05/19/2021   K 4.3 05/19/2021   CO2 26 05/19/2021   GLUCOSE 117 (H) 05/19/2021   BUN 9 05/19/2021   CREATININE 0.59 05/19/2021   BILITOT 0.4 05/19/2021   ALKPHOS 67 02/08/2018   AST 35 05/19/2021   ALT 40 (H) 05/19/2021   PROT 6.9 05/19/2021   ALBUMIN 4.6 02/08/2018   CALCIUM 9.3 05/19/2021   ANIONGAP 7 02/08/2018   EGFR 113 05/19/2021   Lab Results  Component Value Date   CHOL 210 (H) 05/19/2021   Lab Results  Component Value Date   HDL 28 (L) 05/19/2021   Lab Results  Component Value Date   LDLCALC 155 (H) 05/19/2021   Lab Results  Component Value Date   TRIG 148 05/19/2021   Lab Results  Component Value Date   CHOLHDL 7.5 (H) 05/19/2021   Lab Results  Component Value Date   HGBA1C 7.2 (H) 05/19/2021      Assessment & Plan:   Problem List Items Addressed This Visit   None  No orders of the defined types were placed in this encounter.   Follow-up: No follow-ups on file.    [name], DO

## 2021-11-17 ENCOUNTER — Encounter: Payer: Self-pay | Admitting: Internal Medicine

## 2021-11-17 ENCOUNTER — Ambulatory Visit (INDEPENDENT_AMBULATORY_CARE_PROVIDER_SITE_OTHER): Payer: Managed Care, Other (non HMO) | Admitting: Internal Medicine

## 2021-11-17 VITALS — BP 130/80 | HR 81 | Temp 98.7°F | Resp 16 | Ht 63.0 in | Wt 180.2 lb

## 2021-11-17 DIAGNOSIS — E782 Mixed hyperlipidemia: Secondary | ICD-10-CM

## 2021-11-17 DIAGNOSIS — I10 Essential (primary) hypertension: Secondary | ICD-10-CM | POA: Diagnosis not present

## 2021-11-17 DIAGNOSIS — E1165 Type 2 diabetes mellitus with hyperglycemia: Secondary | ICD-10-CM

## 2021-11-17 DIAGNOSIS — F32A Depression, unspecified: Secondary | ICD-10-CM

## 2021-11-17 DIAGNOSIS — F419 Anxiety disorder, unspecified: Secondary | ICD-10-CM | POA: Diagnosis not present

## 2021-11-17 LAB — POCT GLYCOSYLATED HEMOGLOBIN (HGB A1C): Hemoglobin A1C: 13.1 % — AB (ref 4.0–5.6)

## 2021-11-17 MED ORDER — SERTRALINE HCL 25 MG PO TABS: 25.0000 mg | ORAL_TABLET | Freq: Every day | ORAL | 1 refills | Status: DC

## 2021-11-17 NOTE — Assessment & Plan Note (Signed)
Patient stopped Cymbalta as she felt like it wasn't working for her. This was removed from her list. She's done well in the past with Zoloft, doesn't remember why this was stopped. We will restart Zoloft 25 mg today, follow up in 6 weeks for recheck.

## 2021-11-17 NOTE — Assessment & Plan Note (Signed)
A1c increased to 13.1%, patient states she's been drinking orange soda and sweet tea daily. Nothing else has changed in her diet. Continue Januvia 100 mg, Farxiga 10 mg. Stop all sugary beverages, continue to check blood sugars. Follow up in 6 weeks.

## 2021-11-17 NOTE — Assessment & Plan Note (Signed)
Chronic and stable. Doing well on Crestor 40 mg, continue this medication. Plan to recheck lipid panel in December.

## 2021-11-17 NOTE — Assessment & Plan Note (Signed)
Chronic and stable. Continue Lisinopril 20 mg daily.

## 2021-11-17 NOTE — Patient Instructions (Addendum)
It was great seeing you today!  Plan discussed at today's visit: -Stop Cymbalta, start Zoloft 25 mg daily -Please call number on card to schedule mammogram -Plan for blood work in December  -A1c increased to 13.1, need to cut out all soda and sugary drinks   Follow up in: 6 weeks   Take care and let us know if you have any questions or concerns prior to your next visit.  Dr. Rosana Berger

## 2021-12-15 ENCOUNTER — Ambulatory Visit
Admission: RE | Admit: 2021-12-15 | Discharge: 2021-12-15 | Disposition: A | Discharge status: Home / Self Care | Payer: Managed Care, Other (non HMO) | Source: Ambulatory Visit | Attending: Family Medicine | Admitting: Family Medicine

## 2021-12-15 ENCOUNTER — Encounter: Payer: Self-pay | Admitting: Radiology

## 2021-12-15 DIAGNOSIS — Z1231 Encounter for screening mammogram for malignant neoplasm of breast: Secondary | ICD-10-CM | POA: Diagnosis present

## 2021-12-16 DIAGNOSIS — Z419 Encounter for procedure for purposes other than remedying health state, unspecified: Secondary | ICD-10-CM | POA: Diagnosis not present

## 2021-12-18 ENCOUNTER — Other Ambulatory Visit: Payer: Self-pay | Admitting: Internal Medicine

## 2021-12-18 DIAGNOSIS — F419 Anxiety disorder, unspecified: Secondary | ICD-10-CM

## 2021-12-20 ENCOUNTER — Other Ambulatory Visit: Payer: Self-pay | Admitting: *Deleted

## 2021-12-20 ENCOUNTER — Inpatient Hospital Stay
Admission: RE | Admit: 2021-12-20 | Discharge: 2021-12-20 | Disposition: A | Discharge status: Home / Self Care | Payer: Self-pay | Source: Ambulatory Visit | Attending: *Deleted | Admitting: *Deleted

## 2021-12-20 DIAGNOSIS — Z1231 Encounter for screening mammogram for malignant neoplasm of breast: Secondary | ICD-10-CM

## 2021-12-20 NOTE — Telephone Encounter (Signed)
Requested medication (s) are due for refill today: No  Requested medication (s) are on the active medication list: Yes  Last refill:  11/17/21  Future visit scheduled: Yes  Notes to clinic:  Pharmacy requesting 90 day supply and diagnosis code.    Requested Prescriptions  Pending Prescriptions Disp Refills   sertraline (ZOLOFT) 25 MG tablet [Pharmacy Med Name: SERTRALINE HCL 25 MG TABLET] 90 tablet 1    Sig: Take 1 tablet (25 mg total) by mouth daily.     Psychiatry:  Antidepressants - SSRI - sertraline Failed - 12/18/2021  1:31 PM      Failed - ALT in normal range and within 360 days    ALT  Date Value Ref Range Status  05/19/2021 40 (H) 6 - 29 U/L Final   SGPT (ALT)  Date Value Ref Range Status  05/25/2014 40 U/L Final    Comment:    14-63 NOTE: New Reference Range 01/05/14          Passed - AST in normal range and within 360 days    AST  Date Value Ref Range Status  05/19/2021 35 10 - 35 U/L Final   SGOT(AST)  Date Value Ref Range Status  05/25/2014 28 15 - 37 Unit/L Final         Passed - Completed PHQ-2 or PHQ-9 in the last 360 days      Passed - Valid encounter within last 6 months    Recent Outpatient Visits           1 month ago Type 2 diabetes mellitus with hyperglycemia, without long-term current use of insulin [facility])   [place], DO   2 months ago Acute otitis externa of left ear, unspecified type   [facility] [name], PA-C   4 months ago Essential hypertension   [place], DO   7 months ago Uncontrolled type 2 diabetes mellitus with hyperglycemia [facility])   [facility] [name], DO   8 months ago Uncontrolled type 2 diabetes mellitus with hyperglycemia [facility])   [facility] [name], PA-C       Future Appointments             In 1 week [name], PA-C [facility], [facility]

## 2021-12-29 ENCOUNTER — Telehealth: Payer: Managed Care, Other (non HMO) | Admitting: Family Medicine

## 2021-12-29 DIAGNOSIS — F419 Anxiety disorder, unspecified: Secondary | ICD-10-CM

## 2022-01-04 ENCOUNTER — Other Ambulatory Visit: Payer: Self-pay | Admitting: Internal Medicine

## 2022-01-04 DIAGNOSIS — F32A Depression, unspecified: Secondary | ICD-10-CM

## 2022-01-05 NOTE — Telephone Encounter (Signed)
Requested medication (s) are due for refill today: no  Requested medication (s) are on the active medication list: yes  Last refill:  12/21/21  Future visit scheduled:no  Notes to clinic:  Unable to refill per protocol, Rx request is for a 90 day supply, possible duplicate request.     Requested Prescriptions  Pending Prescriptions Disp Refills   sertraline (ZOLOFT) 25 MG tablet [Pharmacy Med Name: SERTRALINE HCL 25 MG TABLET] 90 tablet 1    Sig: TAKE 1 TABLET (25 MG TOTAL) BY MOUTH DAILY.     Psychiatry:  Antidepressants - SSRI - sertraline Failed - 01/04/2022 10:34 AM      Failed - ALT in normal range and within 360 days    ALT  Date Value Ref Range Status  05/19/2021 40 (H) 6 - 29 U/L Final   SGPT (ALT)  Date Value Ref Range Status  05/25/2014 40 U/L Final    Comment:    14-63 NOTE: New Reference Range 01/05/14          Passed - AST in normal range and within 360 days    AST  Date Value Ref Range Status  05/19/2021 35 10 - 35 U/L Final   SGOT(AST)  Date Value Ref Range Status  05/25/2014 28 15 - 37 Unit/L Final         Passed - Completed PHQ-2 or PHQ-9 in the last 360 days      Passed - Valid encounter within last 6 months    Recent Outpatient Visits           1 month ago Type 2 diabetes mellitus with hyperglycemia, without long-term current use of insulin [facility])   [place], DO   3 months ago Acute otitis externa of left ear, unspecified type   [facility] [name], PA-C   4 months ago Essential hypertension   [place], DO   7 months ago Uncontrolled type 2 diabetes mellitus with hyperglycemia [facility])   [facility] [name], DO   8 months ago Uncontrolled type 2 diabetes mellitus with hyperglycemia [facility])   [facility] [name], [place]

## 2022-01-10 ENCOUNTER — Telehealth: Payer: Self-pay | Admitting: Licensed Clinical Social Worker

## 2022-01-10 NOTE — Patient Outreach (Signed)
  Medicaid Managed Care   Unsuccessful Attempt Note   01/10/2022 Name: Lauren [name] MRN: [id number] DOB: 1975/08/12  Referred by: [name], PA-C Reason for referral : Care Coordination   An unsuccessful telephone outreach was attempted today. The patient was referred to the case management team for assistance with care management and care coordination.    Follow Up Plan: A HIPAA compliant phone message was left for the patient providing contact information and requesting a return call.   Eula Fried, BSW, MSW, [employer] Managed Medicaid LCSW [email or web address] Phone: [phone]

## 2022-01-16 DIAGNOSIS — Z419 Encounter for procedure for purposes other than remedying health state, unspecified: Secondary | ICD-10-CM | POA: Diagnosis not present

## 2022-01-19 IMAGING — CT CT CERVICAL SPINE W/O CM
2 series · 10 of 14 positions shown, 12 images · non-contrast
Comparison: None.

CLINICAL DATA: Radiculopathy, history of cervical fusion [DATE]

EXAM:
CT CERVICAL SPINE WITHOUT CONTRAST
TECHNIQUE: Multidetector CT imaging of the cervical spine was performed without
intravenous contrast. Multiplanar CT image reconstructions were also
generated.

[Series 2: cspine soft (person_name) · axial · 0.29mm/px · z∈[-233,-103]mm · 5 of 99 slices shown, 7 images]
[im 17/99  soft-tissue]
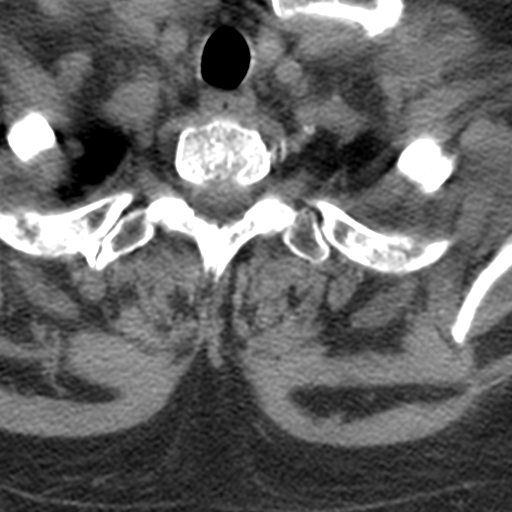
[im 17/99  bone]
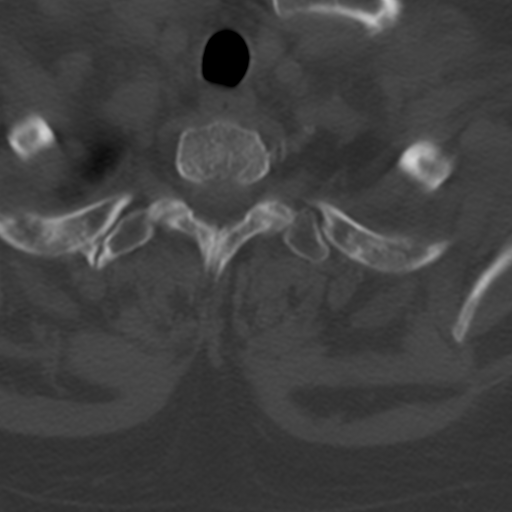
[im 33/99  bone]
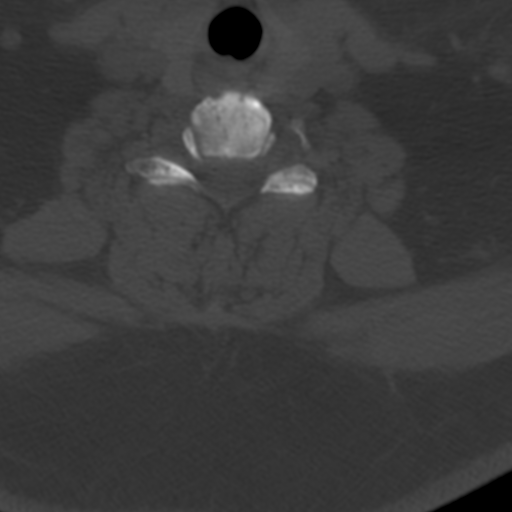
[im 50/99  bone]
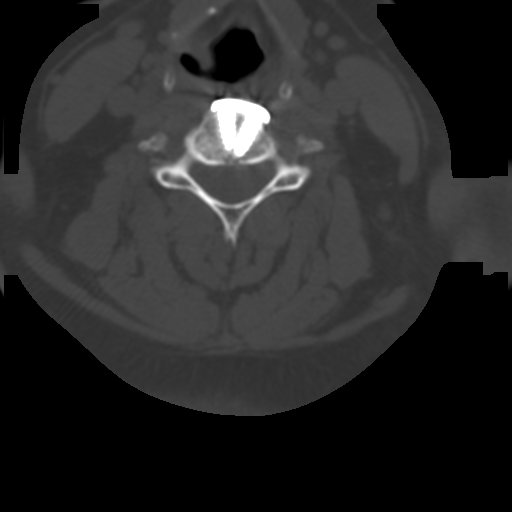
[im 66/99  bone]
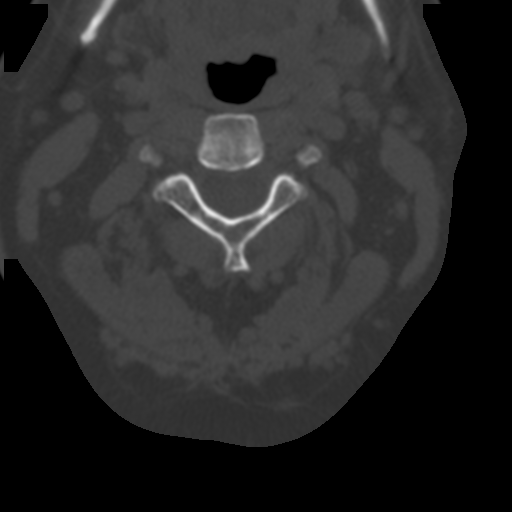
[im 82/99  soft-tissue]
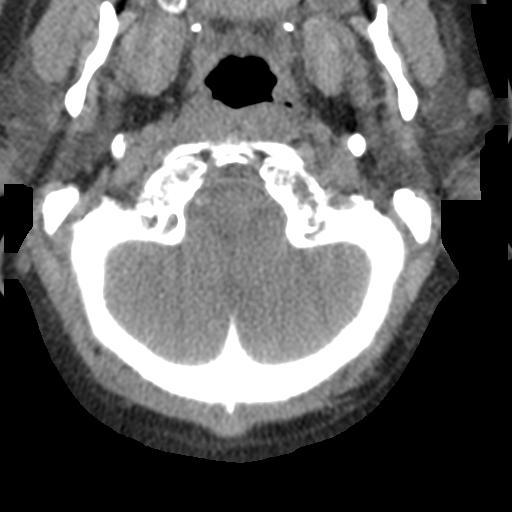
[im 82/99  bone]
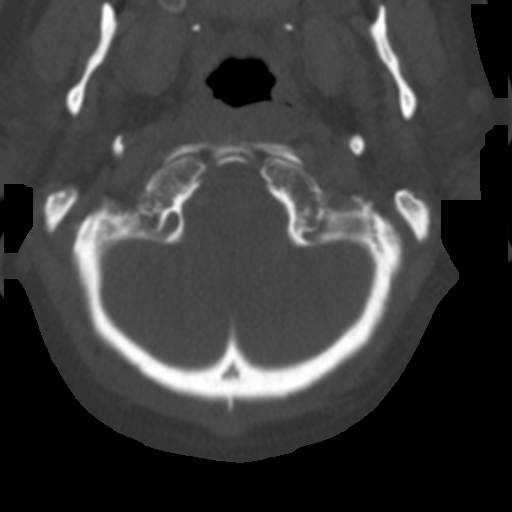

[Series 8: angled axial soft (person_name) · axial · 0.24mm/px · z∈[-249,-120]mm · 5 of 100 slices shown]
[im 17/100  soft-tissue]
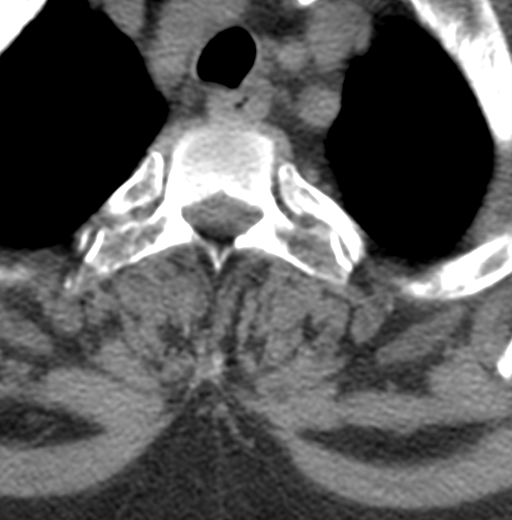
[im 34/100  soft-tissue]
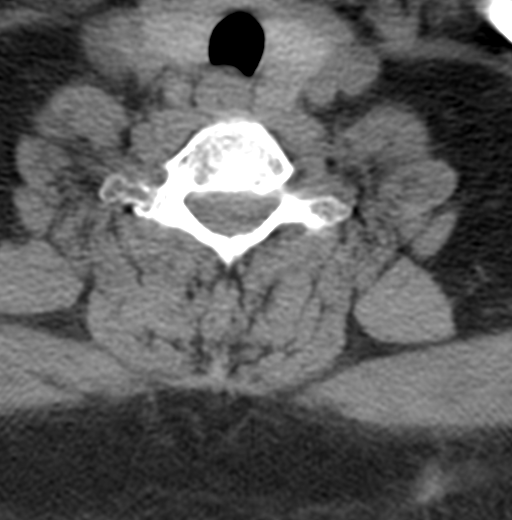
[im 50/100  soft-tissue]
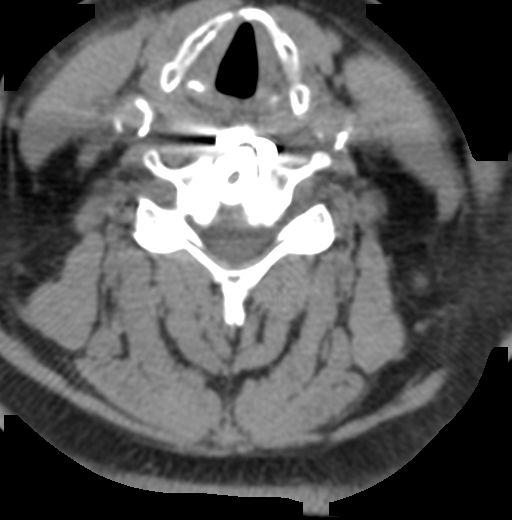
[im 67/100  soft-tissue]
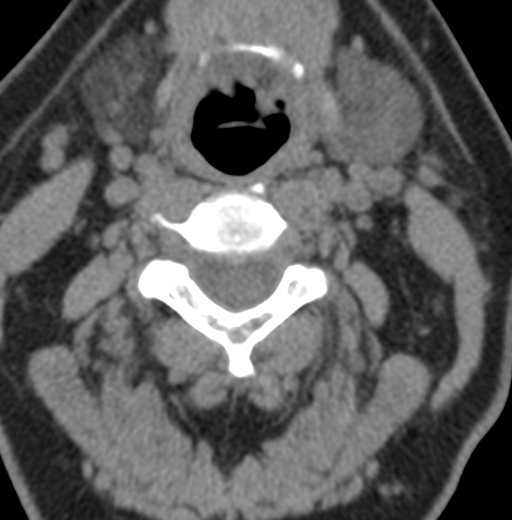
[im 83/100  soft-tissue]
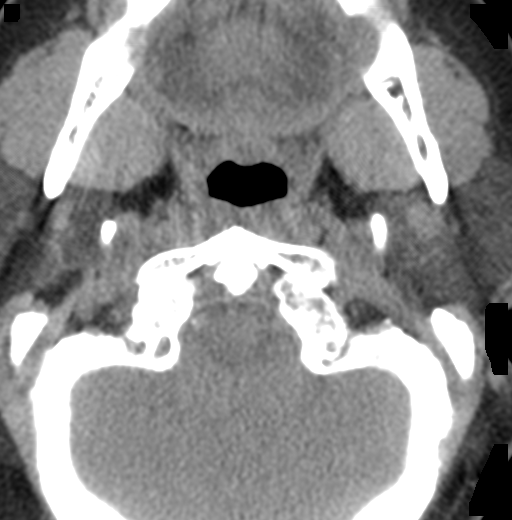

[10 of 14 positions shown; findings below may reference images not displayed]

FINDINGS: Alignment: Normal.

Skull base and vertebrae: No acute fracture. No primary bone lesion
or focal pathologic process.

Soft tissues and spinal canal: No prevertebral fluid or swelling. No
visible canal hematoma.

Disc levels: Status post anterior cervical discectomy and fusion of
C4 through C6. No evidence of hardware fracture or loosening. There
are bilateral uncovertebral osteophytes at C4-C5 and C5-C6, with
some degree of bony neural foraminal stenosis at both levels on the
right (series 7, image 30), without significant bony neural
foraminal stenosis on the left. Disc spaces are otherwise generally
intact.

Upper chest: No significant findings.

Other: None.
IMPRESSION: 1. Status post anterior cervical discectomy and fusion of C4 through
C6. No evidence of hardware fracture or loosening.
2. There are bilateral uncovertebral osteophytes at C4-C5 and C5-C6,
with some degree of bony neural foraminal stenosis at both levels on
the right, without significant bony neural foraminal stenosis on the
left.
3. Disc spaces are otherwise generally intact without obvious disc
pathology by CT.

## 2022-02-16 DIAGNOSIS — Z419 Encounter for procedure for purposes other than remedying health state, unspecified: Secondary | ICD-10-CM | POA: Diagnosis not present

## 2022-03-18 DIAGNOSIS — Z419 Encounter for procedure for purposes other than remedying health state, unspecified: Secondary | ICD-10-CM | POA: Diagnosis not present

## 2022-04-18 DIAGNOSIS — Z419 Encounter for procedure for purposes other than remedying health state, unspecified: Secondary | ICD-10-CM | POA: Diagnosis not present

## 2022-05-18 DIAGNOSIS — Z419 Encounter for procedure for purposes other than remedying health state, unspecified: Secondary | ICD-10-CM | POA: Diagnosis not present

## 2022-06-18 DIAGNOSIS — Z419 Encounter for procedure for purposes other than remedying health state, unspecified: Secondary | ICD-10-CM | POA: Diagnosis not present

## 2022-07-19 DIAGNOSIS — Z419 Encounter for procedure for purposes other than remedying health state, unspecified: Secondary | ICD-10-CM | POA: Diagnosis not present

## 2022-08-17 DIAGNOSIS — Z419 Encounter for procedure for purposes other than remedying health state, unspecified: Secondary | ICD-10-CM | POA: Diagnosis not present

## 2022-08-31 ENCOUNTER — Ambulatory Visit (INDEPENDENT_AMBULATORY_CARE_PROVIDER_SITE_OTHER): Payer: 59 | Admitting: Internal Medicine

## 2022-08-31 ENCOUNTER — Encounter: Payer: Self-pay | Admitting: Internal Medicine

## 2022-08-31 VITALS — BP 130/70 | HR 92 | Temp 98.1°F | Resp 16 | Ht 63.0 in | Wt 176.5 lb

## 2022-08-31 DIAGNOSIS — Z0000 Encounter for general adult medical examination without abnormal findings: Secondary | ICD-10-CM | POA: Diagnosis not present

## 2022-08-31 NOTE — Progress Notes (Signed)
Name: Lauren [name]   MRN: [id number]    DOB: 03-Apr-1976   Date:08/31/2022       Progress Note  Subjective  Chief Complaint  Chief Complaint  Patient presents with   Annual Exam    HPI  Patient presents for annual CPE.  Diet: well rounded, trying to cut down on sugar and sodas  Exercise: not regularly, used to go to gym   Last Eye Exam: UTD Last Dental Exam: UTD  Flowsheet Row Erroneous En[facility]  AUDIT-C Score 0      Depression: Phq 9 is  negative    08/31/2022    2:04 PM 11/17/2021    9:41 AM 09/28/2021   10:13 AM 08/28/2021   10:19 AM 08/18/2021    9:20 AM  Depression screen PHQ 2/9  Decreased Interest 1 1 0 1 0  Down, Depressed, Hopeless 2 1 0 1 0  PHQ - 2 Score 3 2 0 2 0  Altered sleeping 0 1 0 1 0  Tired, decreased energy 0 1 0 1 0  Change in appetite 0 1 0 1 0  Feeling bad or failure about yourself  0 1 0 1 0  Trouble concentrating 0 0 0 1 0  Moving slowly or fidgety/restless 0 1 0 0 0  Suicidal thoughts 0  0 0 0  PHQ-9 Score 3 7 0 7 0  Difficult doing work/chores Not difficult at all Somewhat difficult Not difficult at all  Not difficult at all   Hypertension: BP Readings from Last 3 Encounters:  08/31/22 130/70  11/17/21 130/80  09/28/21 138/82   Obesity: Wt Readings from Last 3 Encounters:  08/31/22 176 lb 8 oz (80.1 kg)  11/17/21 180 lb 3.2 oz (81.7 kg)  09/28/21 182 lb 11.2 oz (82.9 kg)   BMI Readings from Last 3 Encounters:  08/31/22 31.27 kg/m  11/17/21 31.92 kg/m  09/28/21 33.42 kg/m     Vaccines:   Tdap: UTD Shingrix: Discussed  Pneumonia: UTD Flu: 2022 COVID-19: 2021   Hep C Screening: 2022 STD testing and prevention (HIV/chl/gon/syphilis): no concerns  Intimate partner violence: negative screen  LMP: 7 months ago Discussed importance of follow up if any post-menopausal bleeding: not applicable  Incontinence Symptoms: negative for symptoms   Breast cancer:  - Last  Mammogram: 12/15/21  Osteoporosis Prevention : Discussed high calcium and vitamin D supplementation, weight bearing exercises Bone density :not applicable   Cervical cancer screening: 04/26/20 negative   Skin cancer: Discussed monitoring for atypical lesions  Colorectal cancer: colonoscopy 07/27/20, repeat in 7 years   Lung cancer:  Low Dose CT Chest recommended if Age 26-80 years, 20 pack-year currently smoking OR have quit w/in 15years. Patient does not qualify for screen   ECG: 09/28/20, would like repeated today as she has noticed some chest pressure with exertion, none at rest.  Advanced Care Planning: A voluntary discussion about advance care planning including the explanation and discussion of advance directives.  Discussed health care proxy and Living will, and the patient was able to identify a health care proxy as Roe Rutherford daughter.  Patient does not have a living will and power of attorney of health care   Lipids: Lab Results  Component Value Date   CHOL 210 (H) 05/19/2021   CHOL 218 (H) 02/09/2021   CHOL 227 (H) 09/28/2020   Lab Results  Component Value Date   HDL 28 (L) 05/19/2021   HDL 31 (L) 02/09/2021  HDL 43 (L) 09/28/2020   Lab Results  Component Value Date   LDLCALC 155 (H) 05/19/2021   LDLCALC 148 (H) 02/09/2021   LDLCALC 152 (H) 09/28/2020   Lab Results  Component Value Date   TRIG 148 05/19/2021   TRIG 244 (H) 02/09/2021   TRIG 180 (H) 09/28/2020   Lab Results  Component Value Date   CHOLHDL 7.5 (H) 05/19/2021   CHOLHDL 7.0 (H) 02/09/2021   CHOLHDL 5.3 (H) 09/28/2020   No results found for: "LDLDIRECT"  Glucose: Glucose  Date Value Ref Range Status  05/25/2014 106 (H) 65 - 99 mg/dL Final  11/13/2011 112 (H) 65 - 99 mg/dL Final  09/26/2011 172 (H) 65 - 99 mg/dL Final   Glucose, Bld  Date Value Ref Range Status  05/19/2021 117 (H) 65 - 99 mg/dL Final    Comment:    .            Fasting reference interval . For someone without known  diabetes, a glucose value between 100 and 125 mg/dL is consistent with prediabetes and should be confirmed with a follow-up test. .   02/09/2021 293 (H) 65 - 99 mg/dL Final    Comment:    .            Fasting reference interval . For someone without known diabetes, a glucose value >125 mg/dL indicates that they may have diabetes and this should be confirmed with a follow-up test. .   09/28/2020 123 (H) 65 - 99 mg/dL Final    Comment:    .            Fasting reference interval . For someone without known diabetes, a glucose value between 100 and 125 mg/dL is consistent with prediabetes and should be confirmed with a follow-up test. .    Glucose-Capillary  Date Value Ref Range Status  07/27/2020 155 (H) 70 - 99 mg/dL Final    Comment:    Glucose reference range applies only to samples taken after fasting for at least 8 hours.  12/07/2015 135 (H) 65 - 99 mg/dL Final  12/07/2015 200 (H) 65 - 99 mg/dL Final    Patient Active Problem List   Diagnosis Date Noted   Type 2 diabetes mellitus ([place]) 08/18/2021   Cervical radiculopathy 08/04/2020   Encounter for screening colonoscopy    Polyp of transverse colon    Polyp of sigmoid colon    Essential hypertension 05/19/2018   History of vitamin D deficiency 05/18/2018   Tobacco dependence 05/18/2018   Morbid obesity ([place]) 01/13/2018   Allergic rhinitis, seasonal 04/23/2017   Genital herpes 04/23/2017   MDD (major depressive disorder) 05/17/2016   Bronchitis 03/16/2016   Cholesteatoma of attic of right ear 12/05/2015   Nocturnal cough 11/29/2015   GERD (gastroesophageal reflux disease) 11/29/2015   Acid reflux 09/19/2015   Screening for breast cancer 09/19/2015   Chronic pelvic pain in female 09/19/2015   Vitamin D deficiency 12/13/2014   Anxiety and depression 12/13/2014   History of herpes genitalis 12/13/2014   Hyperlipidemia 05/26/2014   Smoker 05/26/2014    Past Surgical History:  Procedure Laterality Date    APPENDECTOMY     CESAREAN SECTION     x 4    COLONOSCOPY     COLONOSCOPY WITH PROPOFOL N/A 07/27/2020   Procedure: COLONOSCOPY WITH PROPOFOL;  Surgeon: Virgel Manifold, MD;  Location: [facility] ENDOSCOPY;  Service: Endoscopy;  Laterality: N/A;  COVID POSITIVE 07/05/2020   HERNIA REPAIR  09/25/2011   TUBAL LIGATION     TYMPANOMASTOIDECTOMY Right 12/07/2015   Procedure: TYMPANOMASTOIDECTOMY;  Surgeon: Clyde Canterbury, MD;  Location: [facility] ORS;  Service: ENT;  Laterality: Right;    Family History  Problem Relation Age of Onset   Hypertension Mother    Hyperlipidemia Mother    Diabetes Mother    Arrhythmia Father 31       A-fib   Alcohol abuse Father    Breast cancer Maternal Aunt    Heart disease Brother 80       stent x 1    Alcoholism Brother    Asthma Son    Asperger's syndrome Son     Social History   Socioeconomic History   Marital status: Single    Spouse name: Not on file   Number of children: 4   Years of education: Not on file   Highest education level: GED or equivalent  Occupational History    Employer: [employer]  Tobacco Use   Smoking status: Every Day    Packs/day: 1.00    Years: 28.00    Additional pack years: 0.00    Total pack years: 28.00    Types: Cigarettes   Smokeless tobacco: Never  Vaping Use   Vaping Use: Never used  Substance and Sexual Activity   Alcohol use: Not Currently    Alcohol/week: 0.0 standard drinks of alcohol   Drug use: No   Sexual activity: Yes    Partners: Male  Other Topics Concern   Not on file  Social History Narrative   Not on file   Social Determinants of Health   Financial Resource Strain: Medium Risk (08/31/2022)   Overall Financial Resource Strain (CARDIA)    Difficulty of Paying Living Expenses: Somewhat hard  Food Insecurity: Food Insecurity Present (08/31/2022)   Hunger Vital Sign    Worried About Running Out of Food in the Last Year: Sometimes true    Ran Out of Food in the Last Year: Sometimes true  Transportation  Needs: No Transportation Needs (08/31/2022)   PRAPARE - Hydrologist (Medical): No    Lack of Transportation (Non-Medical): No  Physical Activity: Insufficiently Active (08/31/2022)   Exercise Vital Sign    Days of Exercise per Week: 1 day    Minutes of Exercise per Session: 30 min  Stress: Stress Concern Present (08/31/2022)   [place]    Feeling of Stress : Rather much  Social Connections: Socially Isolated (08/31/2022)   Social Connection and Isolation Panel [NHANES]    Frequency of Communication with Friends and Family: Once a week    Frequency of Social Gatherings with Friends and Family: Once a week    Attends Religious Services: Never    Marine scientist or Organizations: No    Attends Archivist Meetings: Never    Marital Status: Never married  Intimate Partner Violence: Not At Risk (08/31/2022)   Humiliation, Afraid, Rape, and Kick questionnaire    Fear of Current or Ex-Partner: No    Emotionally Abused: No    Physically Abused: No    Sexually Abused: No     Current Outpatient Medications:    albuterol (PROVENTIL HFA) 108 (90 Base) MCG/ACT inhaler, Inhale 2 puffs into the lungs every 6 (six) hours as needed for wheezing or shortness of breath., Disp: 18 g, Rfl: 2   dapagliflozin propanediol (FARXIGA) 10 MG TABS tablet, Take 1 tablet (10 mg  total) by mouth daily., Disp: 30 tablet, Rfl: 11   fluticasone (FLONASE) 50 MCG/ACT nasal spray, Place 2 sprays into both nostrils daily., Disp: , Rfl:    lisinopril (ZESTRIL) 20 MG tablet, Take 1 tablet (20 mg total) by mouth daily., Disp: 90 tablet, Rfl: 3   rosuvastatin (CRESTOR) 40 MG tablet, Take 1 tablet (40 mg total) by mouth daily., Disp: 90 tablet, Rfl: 3   sertraline (ZOLOFT) 25 MG tablet, TAKE 1 TABLET (25 MG TOTAL) BY MOUTH DAILY., Disp: 30 tablet, Rfl: 0   sitaGLIPtin (JANUVIA) 100 MG tablet, Take 1 tablet (100 mg  total) by mouth daily., Disp: 90 tablet, Rfl: 3   valACYclovir (VALTREX) 1000 MG tablet, Take 1 tablet (1,000 mg total) by mouth daily. Take as needed for herpes outbreak., Disp: 90 tablet, Rfl: 3  Allergies  Allergen Reactions   Cephalexin Other (See Comments)    Secondary yeast infection   Metformin And Related Nausea And Vomiting    With all doses and regular and extended release forms     Review of Systems  All other systems reviewed and are negative.    Objective  Vitals:   08/31/22 1401  BP: 130/70  Pulse: 92  Resp: 16  Temp: 98.1 F (36.7 C)  SpO2: 95%  Weight: 176 lb 8 oz (80.1 kg)  Height: 5\' 3"  (1.6 m)    Body mass index is 31.27 kg/m.  Physical Exam Constitutional:      Appearance: Normal appearance.  HENT:     Head: Normocephalic and atraumatic.     Mouth/Throat:     Mouth: Mucous membranes are moist.     Pharynx: Oropharynx is clear.  Eyes:     Extraocular Movements: Extraocular movements intact.     Conjunctiva/sclera: Conjunctivae normal.     Pupils: Pupils are equal, round, and reactive to light.  Neck:     Vascular: No carotid bruit.     Comments: No thyromegaly  Cardiovascular:     Rate and Rhythm: Normal rate and regular rhythm.  Pulmonary:     Effort: Pulmonary effort is normal.     Breath sounds: Normal breath sounds.  Musculoskeletal:     Cervical back: No tenderness.     Right lower leg: No edema.     Left lower leg: No edema.  Lymphadenopathy:     Cervical: No cervical adenopathy.  Skin:    General: Skin is warm and dry.  Neurological:     General: No focal deficit present.     Mental Status: She is alert. Mental status is at baseline.  Psychiatric:        Mood and Affect: Mood normal.        Behavior: Behavior normal.     No results found for this or any previous visit (from the past 2160 hour(s)).   Fall Risk:    08/31/2022    2:02 PM 11/17/2021    9:37 AM 09/28/2021   10:13 AM 08/18/2021    9:20 AM 05/19/2021   11:12  AM  Fall Risk   Falls in the past year? 0 1 0 0 0  Number falls in past yr: 0 0 0 0 0  Injury with Fall? 0 0 0 0 0  Risk for fall due to :   No Fall Risks    Follow up   Falls prevention discussed;Education provided      Functional Status Survey: Is the patient deaf or have difficulty hearing?: No Does the patient have  difficulty seeing, even when wearing glasses/contacts?: No Does the patient have difficulty concentrating, remembering, or making decisions?: No Does the patient have difficulty walking or climbing stairs?: No Does the patient have difficulty dressing or bathing?: No Does the patient have difficulty doing errands alone such as visiting a doctor's office or shopping?: No  Assessment & Plan  1. Annual physical exam: Labs due. Microalbumin due as well. EKG here normal sinus rhythm but with some non specific ST wave changes. Patient will make an appointment next week to discuss further, for now will treat with over the counter Pepcid as needed.   - CBC w/Diff/Platelet - COMPLETE METABOLIC PANEL WITH GFR - Lipid Profile - HgB A1c - Urine Microalbumin w/creat. ratio - EKG 12-Lead   -USPSTF grade A and B recommendations reviewed with patient; age-appropriate recommendations, preventive care, screening tests, etc discussed and encouraged; healthy living encouraged; see AVS for patient education given to patient -Discussed importance of 150 minutes of physical activity weekly, eat two servings of fish weekly, eat one serving of tree nuts ( cashews, pistachios, pecans, almonds.Marland Kitchen) every other day, eat 6 servings of fruit/vegetables daily and drink plenty of water and avoid sweet beverages.   -Reviewed Health Maintenance: Yes.

## 2022-09-01 LAB — CBC WITH DIFFERENTIAL/PLATELET
Absolute Monocytes: 353 cells/uL (ref 200–950)
Basophils Absolute: 53 cells/uL (ref 0–200)
Basophils Relative: 0.7 %
Eosinophils Absolute: 98 cells/uL (ref 15–500)
Eosinophils Relative: 1.3 %
HCT: 46.8 % — ABNORMAL HIGH (ref 35.0–45.0)
Hemoglobin: 15.6 g/dL — ABNORMAL HIGH (ref 11.7–15.5)
Lymphs Abs: 2453 cells/uL (ref 850–3900)
MCH: 30.1 pg (ref 27.0–33.0)
MCHC: 33.3 g/dL (ref 32.0–36.0)
MCV: 90.2 fL (ref 80.0–100.0)
MPV: 11.7 fL (ref 7.5–12.5)
Monocytes Relative: 4.7 %
Neutro Abs: 4545 cells/uL (ref 1500–7800)
Neutrophils Relative %: 60.6 %
Platelets: 149 10*3/uL (ref 140–400)
RBC: 5.19 10*6/uL — ABNORMAL HIGH (ref 3.80–5.10)
RDW: 13 % (ref 11.0–15.0)
Total Lymphocyte: 32.7 %
WBC: 7.5 10*3/uL (ref 3.8–10.8)

## 2022-09-01 LAB — COMPLETE METABOLIC PANEL WITH GFR
AG Ratio: 1.4 (calc) (ref 1.0–2.5)
ALT: 37 U/L — ABNORMAL HIGH (ref 6–29)
AST: 40 U/L — ABNORMAL HIGH (ref 10–35)
Albumin: 4.1 g/dL (ref 3.6–5.1)
Alkaline phosphatase (APISO): 72 U/L (ref 31–125)
BUN: 9 mg/dL (ref 7–25)
CO2: 25 mmol/L (ref 20–32)
Calcium: 9 mg/dL (ref 8.6–10.2)
Chloride: 105 mmol/L (ref 98–110)
Creat: 0.61 mg/dL (ref 0.50–0.99)
Globulin: 2.9 g/dL (calc) (ref 1.9–3.7)
Glucose, Bld: 157 mg/dL — ABNORMAL HIGH (ref 65–99)
Potassium: 3.9 mmol/L (ref 3.5–5.3)
Sodium: 140 mmol/L (ref 135–146)
Total Bilirubin: 0.4 mg/dL (ref 0.2–1.2)
Total Protein: 7 g/dL (ref 6.1–8.1)
eGFR: 111 mL/min/{1.73_m2} (ref >=60)

## 2022-09-01 LAB — HEMOGLOBIN A1C
Hgb A1c MFr Bld: 10.9 % of total Hgb — ABNORMAL HIGH (ref <5.7)
Mean Plasma Glucose: 266 mg/dL
eAG (mmol/L): 14.7 mmol/L

## 2022-09-01 LAB — MICROALBUMIN / CREATININE URINE RATIO
Creatinine, Urine: 44 mg/dL (ref 20–275)
Microalb Creat Ratio: 7 mcg/mg creat (ref <30)
Microalb, Ur: 0.3 mg/dL

## 2022-09-01 LAB — LIPID PANEL
Cholesterol: 140 mg/dL (ref <200)
HDL: 35 mg/dL — ABNORMAL LOW (ref >=50)
LDL Cholesterol (Calc): 74 mg/dL (calc)
Non-HDL Cholesterol (Calc): 105 mg/dL (calc) (ref <130)
Total CHOL/HDL Ratio: 4 (calc) (ref <5.0)
Triglycerides: 215 mg/dL — ABNORMAL HIGH (ref <150)

## 2022-09-09 NOTE — Progress Notes (Unsigned)
Established Patient Office Visit  Subjective:  Patient ID: Lauren [name], female    DOB: January 22, 1976  Age: 47 y.o. MRN: [id number]  CC:  No chief complaint on file.   HPI Lauren [name] presents for follow up.   CHEST PAIN Time since onset: Duration:{Blank single:19197::"days","weeks","months"} Onset: {Blank single:19197::"sudden","gradual"} Quality: {Blank multiple:19196::"sharp","dull","aching","burning","cramping","ill-defined","itchy","pressure-like","pulling","shooting","sore","stabbing","tender","tearing","throbbing"} Severity: {Blank single:19197::"mild","moderate","severe","1/10","2/10","3/10","4/10","5/10","6/10","7/10","8/10","9/10","10/10"} Location: {Blank single:19197::"substernal","left para substernal","right para substernal","subxiphoid","upper right","upper left","upper sternum"} Radiation: {Blank single:19197::"none","neck","jaw","left arm","right arm","back"} Episode duration:  Frequency: {Blank single:19197::"constant","intermittent","occasional","rare","every few minutes","a few times a hour","a few times a day","a few times a week","a few times a month","a few times a year"} Related to exertion: {Blank single:19197::"yes","no"} Activity when pain started:  Trauma: {Blank single:19197::"yes","no"} Anxiety/recent stressors: {Blank single:19197::"yes","no"} Aggravating factors:  Alleviating factors:  Status: {Blank multiple:19196::"better","worse","stable","fluctuating"} Treatments attempted: {Blank multiple:19196::"nothing","APAP","ibuprofen","antacids"}  Current pain status: {Blank single:19197::"pain free","in pain","chest wall tender"} Shortness of breath: {Blank single:19197::"yes","no"} Cough: {Blank single:19197::"yes","no","yes, productive","yes, non-productive"} Nausea: {Blank single:19197::"yes","no"} Diaphoresis: {Blank single:19197::"yes","no"} Heartburn: {Blank single:19197::"yes","no"} Palpitations: {Blank single:19197::"yes","no"}   Diabetes,  Type 2: -Last A1c : 3/24 10.9 -Medications: Januvia 100 mg, Farxiga 10 mg -Patient is compliant with the above medications and reports no side effects.  -Checking BG at home: checks once in awhile 170-200 random  -Exercise: walks 1 mile a day  -Diet: drinking more orange soda and sweet tea -Eye exam: 1/23 -Foot exam: 9/22 -Microalbumin: UTD 3/24 -Statin: yes -PNA vaccine: yes -Denies symptoms of hypoglycemia, polyuria, polydipsia, numbness extremities, foot ulcers/trauma.    Hypertension: -Medications: Lisinopril 20 -Patient is compliant with above medications and reports no side effects. -Checking BP at home (average): doesn't check at home -Denies any SOB, CP, vision changes, LE edema or symptoms of hypotension   HLD: -Medications: Crestor increased to 40 mg  -Patient is compliant with above medications and reports no side effects.  -Last lipid panel: Lipid Panel     Component Value Date/Time   CHOL 140 08/31/2022 1437   CHOL 155 09/27/2015 0819   TRIG 215 (H) 08/31/2022 1437   HDL 35 (L) 08/31/2022 1437   HDL 32 (L) 09/27/2015 0819   CHOLHDL 4.0 08/31/2022 1437   VLDL 31 (H) 03/16/2016 1048   LDLCALC 74 08/31/2022 1437   LABVLDL 26 09/27/2015 0819   The 10-year ASCVD risk score (Arnett DK, et al., 2019) is: 8.8%   Values used to calculate the score:     Age: 33 years     Sex: Female     Is Non-Hispanic African American: No     Diabetic: Yes     Tobacco smoker: Yes     Systolic Blood Pressure: [id number] mmHg     Is BP treated: Yes     HDL Cholesterol: 35 mg/dL     Total Cholesterol: 140 mg/dL   Elevated LFTs: -CMP 12/22: ALT 40, AST 35 - 3/24 AST 40, ALT 37  Herpes Simplex: -Chronic, oral and genital -Last flare about 1 year ago   Anxiety: -Cymbalta 30 mg BID but felt like it wasn't helping so she is no longer taking it -Feeling more irritable, especially at work -Had been on Zoloft in the past and did well with it  Health Maintenance: -Blood work up to  date -Mammogram: last 2015, ordered but not yet scheduled  -Colonoscopy 2022, repeat in 5 years   Past Medical History:  Diagnosis Date   Allergic rhinitis    Anxiety    Chronic depression    Diabetes mellitus without complication (HCC)    Elevated LFTs    Genital herpes  GERD (gastroesophageal reflux disease)    Hyperlipidemia    Vitamin D deficiency     Past Surgical History:  Procedure Laterality Date   APPENDECTOMY     CESAREAN SECTION     x 4    COLONOSCOPY     COLONOSCOPY WITH PROPOFOL N/A 07/27/2020   Procedure: COLONOSCOPY WITH PROPOFOL;  Surgeon: Virgel Manifold, MD;  Location: [facility];  Service: Endoscopy;  Laterality: N/A;  COVID POSITIVE 07/05/2020   HERNIA REPAIR  09/25/2011   TUBAL LIGATION     TYMPANOMASTOIDECTOMY Right 12/07/2015   Procedure: TYMPANOMASTOIDECTOMY;  Surgeon: Clyde Canterbury, MD;  Location: [facility];  Service: ENT;  Laterality: Right;    Family History  Problem Relation Age of Onset   Hypertension Mother    Hyperlipidemia Mother    Diabetes Mother    Arrhythmia Father 49       A-fib   Alcohol abuse Father    Breast cancer Maternal Aunt    Heart disease Brother 60       stent x 1    Alcoholism Brother    Asthma Son    Asperger's syndrome Son     Social History   Socioeconomic History   Marital status: Single    Spouse name: Not on file   Number of children: 4   Years of education: Not on file   Highest education level: GED or equivalent  Occupational History    Employer: [employer]  Tobacco Use   Smoking status: Every Day    Packs/day: 1.00    Years: 28.00    Additional pack years: 0.00    Total pack years: 28.00    Types: Cigarettes   Smokeless tobacco: Never  Vaping Use   Vaping Use: Never used  Substance and Sexual Activity   Alcohol use: Not Currently    Alcohol/week: 0.0 standard drinks of alcohol   Drug use: No   Sexual activity: Yes    Partners: Male  Other Topics Concern   Not on file  Social History  Narrative   Not on file   Social Determinants of Health   Financial Resource Strain: Medium Risk (08/31/2022)   Overall Financial Resource Strain (CARDIA)    Difficulty of Paying Living Expenses: Somewhat hard  Food Insecurity: Food Insecurity Present (08/31/2022)   Hunger Vital Sign    Worried About Running Out of Food in the Last Year: Sometimes true    Ran Out of Food in the Last Year: Sometimes true  Transportation Needs: No Transportation Needs (08/31/2022)   PRAPARE - Hydrologist (Medical): No    Lack of Transportation (Non-Medical): No  Physical Activity: Insufficiently Active (08/31/2022)   Exercise Vital Sign    Days of Exercise per Week: 1 day    Minutes of Exercise per Session: 30 min  Stress: Stress Concern Present (08/31/2022)   [place]    Feeling of Stress : Rather much  Social Connections: Socially Isolated (08/31/2022)   Social Connection and Isolation Panel [NHANES]    Frequency of Communication with Friends and Family: Once a week    Frequency of Social Gatherings with Friends and Family: Once a week    Attends Religious Services: Never    Marine scientist or Organizations: No    Attends Archivist Meetings: Never    Marital Status: Never married  Intimate Partner Violence: Not At Risk (08/31/2022)   Humiliation, Afraid, Rape, and  Kick questionnaire    Fear of Current or Ex-Partner: No    Emotionally Abused: No    Physically Abused: No    Sexually Abused: No    Outpatient Medications Prior to Visit  Medication Sig Dispense Refill   albuterol (PROVENTIL HFA) 108 (90 Base) MCG/ACT inhaler Inhale 2 puffs into the lungs every 6 (six) hours as needed for wheezing or shortness of breath. 18 g 2   dapagliflozin propanediol (FARXIGA) 10 MG TABS tablet Take 1 tablet (10 mg total) by mouth daily. 30 tablet 11   fluticasone (FLONASE) 50 MCG/ACT nasal spray Place  2 sprays into both nostrils daily.     lisinopril (ZESTRIL) 20 MG tablet Take 1 tablet (20 mg total) by mouth daily. 90 tablet 3   rosuvastatin (CRESTOR) 40 MG tablet Take 1 tablet (40 mg total) by mouth daily. 90 tablet 3   sertraline (ZOLOFT) 25 MG tablet TAKE 1 TABLET (25 MG TOTAL) BY MOUTH DAILY. 30 tablet 0   sitaGLIPtin (JANUVIA) 100 MG tablet Take 1 tablet (100 mg total) by mouth daily. 90 tablet 3   valACYclovir (VALTREX) 1000 MG tablet Take 1 tablet (1,000 mg total) by mouth daily. Take as needed for herpes outbreak. 90 tablet 3   No facility-administered medications prior to visit.    Allergies  Allergen Reactions   Cephalexin Other (See Comments)    Secondary yeast infection   Metformin And Related Nausea And Vomiting    With all doses and regular and extended release forms    ROS Review of Systems  Constitutional:  Negative for chills and fever.  Eyes:  Negative for visual disturbance.  Respiratory:  Negative for cough and shortness of breath.   Cardiovascular:  Negative for chest pain.  Gastrointestinal:  Negative for abdominal pain.  Skin: Negative.   Neurological:  Negative for dizziness and headaches.      Objective:    Physical Exam Constitutional:      Appearance: Normal appearance.  HENT:     Head: Normocephalic and atraumatic.     Ears:     Comments: TM perf ? No drainage or signs of infection noted Eyes:     Conjunctiva/sclera: Conjunctivae normal.  Cardiovascular:     Rate and Rhythm: Normal rate and regular rhythm.  Pulmonary:     Effort: Pulmonary effort is normal.     Breath sounds: Normal breath sounds.  Musculoskeletal:     Right lower leg: No edema.     Left lower leg: No edema.  Skin:    General: Skin is warm and dry.  Neurological:     General: No focal deficit present.     Mental Status: She is alert. Mental status is at baseline.  Psychiatric:        Mood and Affect: Mood normal.        Behavior: Behavior normal.     There  were no vitals taken for this visit. Wt Readings from Last 3 Encounters:  08/31/22 176 lb 8 oz (80.1 kg)  11/17/21 180 lb 3.2 oz (81.7 kg)  09/28/21 182 lb 11.2 oz (82.9 kg)     Health Maintenance Due  Topic Date Due   FOOT EXAM  03/13/2022    There are no preventive care reminders to display for this patient.  Lab Results  Component Value Date   TSH 1.21 10/18/2016   Lab Results  Component Value Date   WBC 7.5 08/31/2022   HGB 15.6 (H) 08/31/2022   HCT 46.8 (  H) 08/31/2022   MCV 90.2 08/31/2022   PLT 149 08/31/2022   Lab Results  Component Value Date   NA 140 08/31/2022   K 3.9 08/31/2022   CO2 25 08/31/2022   GLUCOSE 157 (H) 08/31/2022   BUN 9 08/31/2022   CREATININE 0.61 08/31/2022   BILITOT 0.4 08/31/2022   ALKPHOS 67 02/08/2018   AST 40 (H) 08/31/2022   ALT 37 (H) 08/31/2022   PROT 7.0 08/31/2022   ALBUMIN 4.6 02/08/2018   CALCIUM 9.0 08/31/2022   ANIONGAP 7 02/08/2018   EGFR 111 08/31/2022   Lab Results  Component Value Date   CHOL 140 08/31/2022   Lab Results  Component Value Date   HDL 35 (L) 08/31/2022   Lab Results  Component Value Date   LDLCALC 74 08/31/2022   Lab Results  Component Value Date   TRIG 215 (H) 08/31/2022   Lab Results  Component Value Date   CHOLHDL 4.0 08/31/2022   Lab Results  Component Value Date   HGBA1C 10.9 (H) 08/31/2022      Assessment & Plan:   Problem List Items Addressed This Visit   None No orders of the defined types were placed in this encounter.   Follow-up: No follow-ups on file.    [name], DO

## 2022-09-10 ENCOUNTER — Encounter: Payer: Self-pay | Admitting: Internal Medicine

## 2022-09-10 ENCOUNTER — Ambulatory Visit (INDEPENDENT_AMBULATORY_CARE_PROVIDER_SITE_OTHER): Payer: 59 | Admitting: Internal Medicine

## 2022-09-10 VITALS — BP 126/72 | HR 96 | Temp 97.9°F | Resp 18 | Ht 63.0 in | Wt 177.7 lb

## 2022-09-10 DIAGNOSIS — I10 Essential (primary) hypertension: Secondary | ICD-10-CM | POA: Diagnosis not present

## 2022-09-10 DIAGNOSIS — I200 Unstable angina: Secondary | ICD-10-CM

## 2022-09-10 DIAGNOSIS — E782 Mixed hyperlipidemia: Secondary | ICD-10-CM | POA: Diagnosis not present

## 2022-09-10 DIAGNOSIS — E1165 Type 2 diabetes mellitus with hyperglycemia: Secondary | ICD-10-CM

## 2022-09-10 MED ORDER — RYBELSUS 3 MG PO TABS: 3.0000 mg | ORAL_TABLET | Freq: Every day | ORAL | 1 refills | Status: DC

## 2022-09-10 MED ORDER — DAPAGLIFLOZIN PROPANEDIOL 10 MG PO TABS: 10.0000 mg | ORAL_TABLET | Freq: Every day | ORAL | 1 refills | Status: DC

## 2022-09-10 MED ORDER — SITAGLIPTIN PHOSPHATE 100 MG PO TABS: 100.0000 mg | ORAL_TABLET | Freq: Every day | ORAL | 1 refills | Status: DC

## 2022-09-10 MED ORDER — ROSUVASTATIN CALCIUM 40 MG PO TABS: 40.0000 mg | ORAL_TABLET | Freq: Every day | ORAL | 1 refills | Status: DC

## 2022-09-10 MED ORDER — NITROGLYCERIN 0.3 MG SL SUBL: 0.3000 mg | SUBLINGUAL_TABLET | SUBLINGUAL | 12 refills | Status: DC | PRN

## 2022-09-10 MED ORDER — LISINOPRIL 20 MG PO TABS: 20.0000 mg | ORAL_TABLET | Freq: Every day | ORAL | 1 refills | Status: DC

## 2022-09-10 NOTE — Patient Instructions (Addendum)
It was great seeing you today!  Plan discussed at today's visit: -Medications refilled -Try Rybelsus 3 mg for diabetes, if not covered by insurance please call and let me know   -Nitroglycerin ordered to take as needed for chest pain, referral to Cardiology placed. Take Nitro as needed for chest pain every 5 minutes, if you have to take 3 please call [phone]  Follow up in: 3 months   Take care and let us know if you have any questions or concerns prior to your next visit.  Dr. Rosana Berger

## 2022-09-13 ENCOUNTER — Ambulatory Visit: Payer: Managed Care, Other (non HMO) | Admitting: Internal Medicine

## 2022-09-14 ENCOUNTER — Telehealth: Payer: Self-pay | Admitting: Family Medicine

## 2022-09-14 NOTE — Telephone Encounter (Signed)
Patient call stated she went to the pharmacy and the sitaGLIPtin (JANUVIA) 100 MG tablet  was not covered by her insurance anymore. She is requesting provider switch her medication to something else that will work for her [id number] - [place], [place] - [address] Phone: [phone]  Fax: [phone]    Please advise patient

## 2022-09-17 ENCOUNTER — Other Ambulatory Visit: Payer: Self-pay | Admitting: Internal Medicine

## 2022-09-17 DIAGNOSIS — Z419 Encounter for procedure for purposes other than remedying health state, unspecified: Secondary | ICD-10-CM | POA: Diagnosis not present

## 2022-09-17 NOTE — Telephone Encounter (Signed)
Pt aware.

## 2022-09-17 NOTE — Telephone Encounter (Signed)
Pt states she was able to get [place] and Rybelsus.

## 2022-09-17 NOTE — Telephone Encounter (Signed)
Pt seen on 09/10/22

## 2022-09-27 DIAGNOSIS — S0081XA Abrasion of other part of head, initial encounter: Secondary | ICD-10-CM | POA: Diagnosis not present

## 2022-09-27 DIAGNOSIS — S52135A Nondisplaced fracture of neck of left radius, initial encounter for closed fracture: Secondary | ICD-10-CM | POA: Diagnosis not present

## 2022-09-27 DIAGNOSIS — S4992XA Unspecified injury of left shoulder and upper arm, initial encounter: Secondary | ICD-10-CM | POA: Diagnosis not present

## 2022-09-27 DIAGNOSIS — Z9181 History of falling: Secondary | ICD-10-CM | POA: Diagnosis not present

## 2022-10-01 DIAGNOSIS — E119 Type 2 diabetes mellitus without complications: Secondary | ICD-10-CM | POA: Diagnosis not present

## 2022-10-01 DIAGNOSIS — S62102A Fracture of unspecified carpal bone, left wrist, initial encounter for closed fracture: Secondary | ICD-10-CM | POA: Diagnosis not present

## 2022-10-01 DIAGNOSIS — S52335A Nondisplaced oblique fracture of shaft of left radius, initial encounter for closed fracture: Secondary | ICD-10-CM | POA: Diagnosis not present

## 2022-10-15 ENCOUNTER — Encounter: Payer: Self-pay | Admitting: Cardiology

## 2022-10-15 ENCOUNTER — Ambulatory Visit: Payer: 59 | Attending: Cardiology | Admitting: Cardiology

## 2022-10-15 VITALS — BP 109/71 | HR 94 | Ht 63.0 in | Wt 176.0 lb

## 2022-10-15 DIAGNOSIS — J449 Chronic obstructive pulmonary disease, unspecified: Secondary | ICD-10-CM | POA: Insufficient documentation

## 2022-10-15 DIAGNOSIS — I200 Unstable angina: Secondary | ICD-10-CM | POA: Diagnosis not present

## 2022-10-15 DIAGNOSIS — F17200 Nicotine dependence, unspecified, uncomplicated: Secondary | ICD-10-CM

## 2022-10-15 DIAGNOSIS — E669 Obesity, unspecified: Secondary | ICD-10-CM

## 2022-10-15 DIAGNOSIS — E1169 Type 2 diabetes mellitus with other specified complication: Secondary | ICD-10-CM | POA: Diagnosis not present

## 2022-10-15 DIAGNOSIS — I10 Essential (primary) hypertension: Secondary | ICD-10-CM

## 2022-10-15 DIAGNOSIS — E785 Hyperlipidemia, unspecified: Secondary | ICD-10-CM

## 2022-10-15 MED ORDER — CARVEDILOL 3.125 MG PO TABS: 3.1250 mg | ORAL_TABLET | Freq: Two times a day (BID) | ORAL | 3 refills | Status: DC

## 2022-10-15 MED ORDER — LISINOPRIL 10 MG PO TABS: 10.0000 mg | ORAL_TABLET | Freq: Every day | ORAL | 3 refills | Status: DC

## 2022-10-15 MED ORDER — ASPIRIN 81 MG PO TBEC: 81.0000 mg | DELAYED_RELEASE_TABLET | Freq: Every day | ORAL | 3 refills | Status: AC

## 2022-10-15 NOTE — Progress Notes (Signed)
Primary Care Provider: Danelle Berry, [facility] Cardiologist: Bryan Lemma, MD Electrophysiologist: None  Clinic Note: Chief Complaint  Patient presents with   New Patient (Initial Visit)    Chest pain concerning for angina    ===================================   ASSESSMENT/PLAN   Problem List Items Addressed This Visit       Cardiology Problems   Progressive angina [facility]) - Primary    She has now had several weeks of progressively worsening frequency and intensity of chest discomfort initially with exertion now with less exertion and even at rest with lying down consistent with angina decubitus. She has diabetes, hypertension and hyperlipidemia and is a smoker.  High risk for this being progressive angina.  We discussed options of GXT, Myoview, coronary CTA or cardiac catheterization discussing all the options with risks and benefits.  Informed decision-making after discussion with the patient, we have we agreed on proceeding with cardiac catheterization as the symptoms would continue despite a negative stress test which we would probably not believe.  Plan: Schedule for Cardiac Catheterization Friday, May 3-later this week. We will add carvedilol 3.25 mg daily and decrease lisinopril to 10 mg daily Insurance nitroglycerin refill placed. Load aspirin 324 mg x 1, then 81 mg daily. Continue increased dose of rosuvastatin 40 mg daily Continue Farxiga, Rybelsus and Januvia, but will need to be held for upcoming procedure  Shared Decision Making/Informed Consent The risks [stroke (1 in 1000), death (1 in 1000), kidney failure [usually temporary] (1 in 500), bleeding (1 in 200), allergic reaction [possibly serious] (1 in 200)], benefits (diagnostic support and management of coronary artery disease) and alternatives of a cardiac catheterization were discussed in detail with Ms. [name] and she is willing to proceed.       Relevant Medications   carvedilol (COREG)  3.125 MG tablet   lisinopril (ZESTRIL) 10 MG tablet   aspirin EC 81 MG tablet   Other Relevant Orders   Basic metabolic panel   CBC   Hyperlipidemia associated with type 2 diabetes mellitus (HCC) (Chronic)    Last set of cholesterol levels showed LDL at 74-with concern for possible progressive angina and CAD, would shoot for LDL closer to 55. Was increased to 40 mg rosuvastatin by PCP.   Labs to be followed up soon => goal to be updated based on cath findings.  Last A1c was 10.9.  Expected that she may very well end up on insulin.  She is on [place] and Rybelsus both which have cardiac protection.  Discussed importance of dietary modification . Combination of hypertriglyceridemia, DM-2 and HTN but shows metabolic syndrome.        Relevant Medications   carvedilol (COREG) 3.125 MG tablet   lisinopril (ZESTRIL) 10 MG tablet   aspirin EC 81 MG tablet   Essential hypertension (Chronic)    Blood pressure actually is pretty good today.  Given concern for possible CAD I think we need [place] start a beta-blocker as well start carvedilol 3.125 mg twice daily.  In order to allow for blood pressure for him, we will reduce her lisinopril to 10 mg daily.      Relevant Medications   carvedilol (COREG) 3.125 MG tablet   lisinopril (ZESTRIL) 10 MG tablet   aspirin EC 81 MG tablet     Other   Type 2 diabetes mellitus with obesity (HCC)   Relevant Medications   lisinopril (ZESTRIL) 10 MG tablet   aspirin EC 81 MG tablet   Other Relevant Orders  Basic metabolic panel   CBC   Tobacco dependence (Chronic)    Smoking cessation instruction/counseling given:  counseled patient on the dangers of tobacco use, advised patient to stop smoking, and reviewed strategies to maximize success;  Receiving cardiac catheterization lab, will be more forceful with this instruction.       Morbid obesity (HCC)   Chronic obstructive pulmonary disease, unspecified COPD type (HCC) (Chronic)    Smoke cessation  counseling provided. Is only on Flonase, and PRN  albuterol      Relevant Orders   Basic metabolic panel   CBC    ===================================  HPI:    Lauren [name] is a 47 y.o. female with PMH notable for HTN, HLD and DM-2 who is being seen today for the evaluation of CHEST PAIN at the request of Margarita Mail, DO.  She was seen back in 2015 by Dr. Mariah Milling for chest pain -> lost to follow-up.  ARMANDA FORAND was recently by Dr. Caralee Ates seen on September 10, 2022 for complaints of chest pain.   She noted gradual onset of chest pain described as left parasternal pressure radiating to both arms.  Few times a week.  Initially with exertion, but also at rest.  Started noticing it when she was hiking or walking her dog.  Now occurs with watching TV. => Increased Crestor to 40 mg daily and ordered lipid panel.  Discussed smoking cessation counseling.  Referred to cardiology  Recent Hospitalizations: None  Reviewed  CV studies:    The following studies were reviewed today: (if available, images/films reviewed: From Epic Chart or Care Everywhere) None:  Interval History:   Lauren [name] presents here today for cardiology evaluation for chest pain.  She still having the chest discomfort she says it usually happens with exertion/exercise rhythm mild walking.  But recently has been happening while watching TV or lying down.  She describes it as a pressure tightening sensation that can last up to a minute or so and it radiates down to both arms.  Usually if she takes nice deep breath it goes down on its own.  Sometimes when she lies down she has orthopnea type symptoms and will also notice the discomfort then.  Oftentimes this occurs especially when lying down and she will noticed heart racing sensation.  Somewhat takes her breath away.  She does not notice the palpitations with exertion however.  When she does have the palpitations at rest she does have the chest discomfort.  She says she is  taken nitroglycerin a couple times since she saw her PCP and it has helped alleviate symptoms.   She has had some occasional episodes of lightheadedness and near syncope but has not had any true syncope.  She did have a fall earlier this month where she lost her balance.  She landed on her left wrist and fractured her forearm/wrist.  She also landed on her back and has a lot of pain when she coughs between the shoulder blades.  Prior to the onset of this chest discomfort episodes, she was not having the palpitations or orthopnea symptoms.  CV Review of Symptoms (Summary): Cardiovascular ROS: positive for - chest pain, dyspnea on exertion, orthopnea, palpitations, rapid heart rate, and notable exercise intolerance and fatigue. negative for - edema, irregular heartbeat, paroxysmal nocturnal dyspnea, or syncope or near syncope, TIA/amaurosis fugax or claudication.  REVIEWED OF SYSTEMS   Review of Systems  Constitutional:  Positive for malaise/fatigue.  HENT:  Negative for congestion.  Respiratory:  Positive for cough (Smoker's cough) and shortness of breath.   Cardiovascular:  Positive for chest pain, palpitations and orthopnea. Negative for leg swelling.  Gastrointestinal:  Negative for abdominal pain, blood in stool and melena.  Genitourinary:  Negative for frequency and hematuria.  Musculoskeletal:  Positive for joint pain (Left wrist).  Neurological:  Positive for dizziness. Negative for focal weakness.  Psychiatric/Behavioral: Negative.      I have reviewed and (if needed) personally updated the patient's problem list, medications, allergies, past medical and surgical history, social and family history.   PAST MEDICAL HISTORY   Past Medical History:  Diagnosis Date   Allergic rhinitis    Anxiety    Chronic depression    Diabetes mellitus without complication (HCC)    Elevated LFTs    Genital herpes    GERD (gastroesophageal reflux disease)    Hyperlipidemia    Vitamin D  deficiency     PAST SURGICAL HISTORY   Past Surgical History:  Procedure Laterality Date   APPENDECTOMY     CESAREAN SECTION     x 4    COLONOSCOPY     COLONOSCOPY WITH PROPOFOL N/A 07/27/2020   Procedure: COLONOSCOPY WITH PROPOFOL;  Surgeon: Pasty Spillers, MD;  Location: [facility] ENDOSCOPY;  Service: Endoscopy;  Laterality: N/A;  COVID POSITIVE 07/05/2020   HERNIA REPAIR  09/25/2011   TUBAL LIGATION     TYMPANOMASTOIDECTOMY Right 12/07/2015   Procedure: TYMPANOMASTOIDECTOMY;  Surgeon: Geanie Logan, MD;  Location: [facility] ORS;  Service: ENT;  Laterality: Right;    MEDICATIONS/ALLERGIES   Current Meds  Medication Sig   albuterol (PROVENTIL HFA) 108 (90 Base) MCG/ACT inhaler Inhale 2 puffs into the lungs every 6 (six) hours as needed for wheezing or shortness of breath.   aspirin EC 81 MG tablet Take 1 tablet (81 mg total) by mouth daily. Swallow whole.   carvedilol (COREG) 3.125 MG tablet Take 1 tablet (3.125 mg total) by mouth 2 (two) times daily.   dapagliflozin propanediol (FARXIGA) 10 MG TABS tablet Take 1 tablet (10 mg total) by mouth daily.   fluticasone (FLONASE) 50 MCG/ACT nasal spray Place 2 sprays into both nostrils daily.   lisinopril (ZESTRIL) 10 MG tablet Take 1 tablet (10 mg total) by mouth daily.   nitroGLYCERIN (NITROSTAT) 0.3 MG SL tablet Place 1 tablet (0.3 mg total) under the tongue every 5 (five) minutes as needed for chest pain.   rosuvastatin (CRESTOR) 40 MG tablet Take 1 tablet (40 mg total) by mouth daily.   Semaglutide (RYBELSUS) 3 MG TABS Take 1 tablet (3 mg total) by mouth daily.   sitaGLIPtin (JANUVIA) 100 MG tablet Take 1 tablet (100 mg total) by mouth daily.   valACYclovir (VALTREX) 1000 MG tablet Take 1 tablet (1,000 mg total) by mouth daily. Take as needed for herpes outbreak.   [DISCONTINUED] lisinopril (ZESTRIL) 20 MG tablet Take 1 tablet (20 mg total) by mouth daily.    Allergies  Allergen Reactions   Cephalexin Other (See Comments)    Secondary  yeast infection   Metformin And Related Nausea And Vomiting    With all doses and regular and extended release forms    SOCIAL HISTORY/FAMILY HISTORY   Reviewed in Epic:   Social History   Tobacco Use   Smoking status: Every Day    Packs/day: 1.00    Years: 28.00    Additional pack years: 0.00    Total pack years: 28.00    Types: Cigarettes   Smokeless tobacco:  Never  Vaping Use   Vaping Use: Never used  Substance Use Topics   Alcohol use: Not Currently    Alcohol/week: 0.0 standard drinks of alcohol   Drug use: No   Social History   Social History Narrative   Not on file   Family History  Problem Relation Age of Onset   Hypertension Mother    Hyperlipidemia Mother    Diabetes Mother    Arrhythmia Father 83       A-fib   Alcohol abuse Father    Breast cancer Maternal Aunt    Heart disease Brother 65       stent x 1    Alcoholism Brother    Asthma Son    Asperger's syndrome Son     OBJCTIVE -PE, EKG, labs   Wt Readings from Last 3 Encounters:  10/15/22 176 lb (79.8 kg)  09/10/22 177 lb 11.2 oz (80.6 kg)  08/31/22 176 lb 8 oz (80.1 kg)    Physical Exam: BP 109/71   Pulse 94   Ht 5\' 3"  (1.6 m)   Wt 176 lb (79.8 kg)   SpO2 100%   BMI 31.18 kg/m  Physical Exam Vitals reviewed.  Constitutional:      General: She is not in acute distress.    Appearance: Normal appearance. She is obese. She is not ill-appearing or toxic-appearing.  HENT:     Head: Normocephalic and atraumatic.  Eyes:     Extraocular Movements: Extraocular movements intact.     Pupils: Pupils are equal, round, and reactive to light.  Neck:     Vascular: No carotid bruit or JVD.  Cardiovascular:     Rate and Rhythm: Normal rate and regular rhythm. No extrasystoles are present.    Chest Wall: PMI is not displaced.     Pulses: Normal pulses.     Heart sounds: Normal heart sounds, S1 normal and S2 normal. No murmur heard.    No friction rub. No gallop.  Pulmonary:     Effort:  Pulmonary effort is normal. No respiratory distress.     Comments: Mild diffuse interstitial sounds, no wheezes rales or rhonchi. Chest:     Chest wall: No tenderness.  Abdominal:     General: Abdomen is flat. Bowel sounds are normal. There is no distension.     Palpations: Abdomen is soft. There is no mass.     Tenderness: There is no abdominal tenderness. There is no guarding.     Hernia: No hernia is present.     Comments: No HSM or bruit  Musculoskeletal:        General: Deformity (Has a brace on left wrist) present. No swelling.     Cervical back: Normal range of motion and neck supple.  Skin:    General: Skin is warm and dry.  Neurological:     General: No focal deficit present.     Mental Status: She is alert and oriented to person, place, and time.  Psychiatric:        Mood and Affect: Mood normal.        Behavior: Behavior normal.        Thought Content: Thought content normal.        Judgment: Judgment normal.     Comments: Somewhat blunted affect      Adult ECG Report from PCP 08/24/2022  Rate: 79;  Rhythm: normal sinus rhythm and nonspecific ST (T wave changes. ;  Subtle ST depressions in lateral leads, cannot exclude  ischemia.  Narrative Interpretation: Personally reviewed.  Borderline EKG.  Recent Labs: Labs reviewed.  Rosuvastatin dose was increased shortly prior to this. Lab Results  Component Value Date   CHOL 140 08/31/2022   HDL 35 (L) 08/31/2022   LDLCALC 74 08/31/2022   TRIG 215 (H) 08/31/2022   CHOLHDL 4.0 08/31/2022   Lab Results  Component Value Date   CREATININE 0.61 08/31/2022   BUN 9 08/31/2022   NA 140 08/31/2022   K 3.9 08/31/2022   CL 105 08/31/2022   CO2 25 08/31/2022      Latest Ref Rng & Units 08/31/2022    2:37 PM 09/28/2020    3:07 PM 06/27/2020   10:29 AM  CBC  WBC 3.8 - 10.8 Thousand/uL 7.5  7.2  6.9   Hemoglobin 11.7 - 15.5 g/dL 56.4  33.2  95.1   Hematocrit 35.0 - 45.0 % 46.8  42.5  42.1   Platelets 140 - 400 Thousand/uL 149   222  187     Lab Results  Component Value Date   HGBA1C 10.9 (H) 08/31/2022   Lab Results  Component Value Date   TSH 1.21 10/18/2016    ================================================== I spent a total of 29 minutes with the patient spent in direct patient consultation.  Additional time spent with chart review  / charting (studies, outside notes, etc): 22 min Total Time: 512n/a min  Current medicines are reviewed at length with the patient today.  (+/- concerns) n/a2  Notice: This dictation was prepared with Dragon dictation along with smart phrase technology. Any transcriptional errors that result from this process are unintentional and may not be corrected upon review.   Studies Ordered:  Orders Placed This Encounter  Procedures   Basic metabolic panel   CBC   Meds ordered this encounter  Medications   carvedilol (COREG) 3.125 MG tablet    Sig: Take 1 tablet (3.125 mg total) by mouth 2 (two) times daily.    Dispense:  180 tablet    Refill:  3   lisinopril (ZESTRIL) 10 MG tablet    Sig: Take 1 tablet (10 mg total) by mouth daily.    Dispense:  90 tablet    Refill:  3    Dose changes, discontinue previous dose.   aspirin EC 81 MG tablet    Sig: Take 1 tablet (81 mg total) by mouth daily. Swallow whole.    Dispense:  90 tablet    Refill:  3    Patient Instructions / Medication Changes & Studies & Tests Ordered   Patient Instructions  Medication Instructions:     Take aspirin  324 tablet today which =( 4 ) 81 mg  then take one 81 mg starting tomorrow  Decrease Lisinopril  10 mg   ( take 1/2 tablet of 20 mg ) until bottle is empty   Start taking Carvedilol  3.125 mg twice a day   *If you need a refill on your cardiac medications before your next appointment, please call your pharmacy*   Lab Work: BMP CBC If you have labs (blood work) drawn today and your tests are completely normal, you will receive your results only by: MyChart Message (if you have  MyChart) OR A paper copy in the mail If you have any lab test that is abnormal or we need to change your treatment, we will call you to review the results.   Testing/Procedures: Will be schedule at [address] [facility] - go to  Entrance A- [place] Your physician has requested that you have a cardiac catheterization. Cardiac catheterization is used to diagnose and/or treat various heart conditions. Doctors may recommend this procedure for a number of different reasons. The most common reason is to evaluate chest pain. Chest pain can be a symptom of coronary artery disease (CAD), and cardiac catheterization can show whether plaque is narrowing or blocking your heart's arteries. This procedure is also used to evaluate the valves, as well as measure the blood flow and oxygen levels in different parts of your heart.  Please follow instruction as given below.     Follow-Up: At [facility], you and your health needs are our priority.  As part of our continuing mission to provide you with exceptional heart care, we have created designated Provider Care Teams.  These Care Teams include your primary Cardiologist (physician) and Advanced Practice Providers (APPs -  Physician Assistants and Nurse Practitioners) who all work together to provide you with the care you need, when you need it.     Your next appointment:   2 week(s)  The format for your next appointment:   In Person  Provider:   Bryan Lemma, MD  or APP in  [facility] office    Other Instructions   [facility] A DEPT OF [facility] AT [facility] [address] [place] [id number] [place] [address] Dept: [phone] Loc: [phone]  [employer]  10/15/2022  You are scheduled for a Cardiac Catheterization on Friday, May 3 with Dr. Bryan Lemma.  1. Please arrive at the [facility] (Main Entrance A) at [facility]: [address] [place], [place] [address] at 5:30 AM (This time is 2 hour(s) before your procedure to ensure your preparation). Free valet parking service is available. You will check in at ADMITTING. The support person will be asked to wait in the waiting room.  It is OK to have someone drop you off and come back when you are ready to be discharged.    Special note: Every effort is made to have your procedure done on time. Please understand that emergencies sometimes delay scheduled procedures.  2. Diet: Do not eat solid foods after midnight.  The patient may have clear liquids until 5am upon the day of the procedure.  3. Labs: You will need to have blood drawn on today  CBC,BMP , April 29 at [facility] [address], [place]  Open: 8am - 5pm (Lunch 12:30 - 1:30)   Phone: [phone]. You do not need to be fasting.  4. Medication instructions in preparation for your procedure:   Contrast Allergy: No    Stop taking, Lisinopril (Zestril or Prinivil) Friday, May 3,    Do not take Rybesus , Tiburcio Bash   Friday, May 3,  On the morning of your procedure, take your Aspirin 81 mg and any morning medicines NOT listed above.  You may use sips of water.  5. Plan to go home the same day, you will only stay overnight if medically necessary. 6. Bring a current list of your medications and current insurance cards. 7. You MUST have a responsible person to drive you home. 8. Someone MUST be with you the first 24 hours after you arrive home or your discharge will be delayed. 9. Please wear clothes that are easy to get on and off and wear slip-on shoes.  Thank you for allowing [place] to care for  you!   -- [place] [facility]     Marykay Lex, MD, MS Bryan Lemma, M.D., M.S. Interventional Cardiologist  [facility]  Pager # [phone] Phone # [phone] [address] [place], [place] [address]   Thank you for choosing [facility] [place]!!

## 2022-10-15 NOTE — H&P (View-Only) (Signed)
  Primary Care Provider: Tapia, Leisa, PA-C [facility] Cardiologist: Dixie Jafri, MD Electrophysiologist: None  Clinic Note: Chief Complaint  [name] presents with   [name] (Initial Visit)    Chest pain concerning for angina    ===================================   ASSESSMENT/PLAN   Problem List Items Addressed This Visit       Cardiology Problems   Progressive angina (HCC) - Primary    Lauren [name] has now had several weeks of progressively worsening frequency and intensity of chest discomfort initially with exertion now with less exertion and even at rest with lying down consistent with angina decubitus. Lauren [name] has diabetes, hypertension and hyperlipidemia and is a smoker.  High risk for this being progressive angina.  We discussed options of GXT, Myoview, coronary CTA or cardiac catheterization discussing all Lauren options with risks and benefits.  Informed decision-making after discussion with Lauren [name], we have we agreed on proceeding with cardiac catheterization as Lauren symptoms would continue despite a negative stress test which we would probably not believe.  Plan: Schedule for Cardiac Catheterization Friday, May 3-later this week. We will add carvedilol 3.25 mg daily and decrease lisinopril to 10 mg daily Insurance nitroglycerin refill placed. Load aspirin 324 mg x 1, then 81 mg daily. Continue increased dose of rosuvastatin 40 mg daily Continue Farxiga, Rybelsus and Januvia, but will need to be held for upcoming procedure  Shared Decision Making/Informed Consent Lauren risks [stroke (1 in 1000), death (1 in 1000), kidney failure [usually temporary] (1 in 500), bleeding (1 in 200), allergic reaction [possibly serious] (1 in 200)], benefits (diagnostic support and management of coronary artery disease) and alternatives of a cardiac catheterization were discussed in detail with Lauren [name] and Lauren [name] is willing to proceed.       Relevant Medications   carvedilol (COREG)  3.125 MG tablet   lisinopril (ZESTRIL) 10 MG tablet   aspirin EC 81 MG tablet   Other Relevant Orders   Basic metabolic panel   CBC   Hyperlipidemia associated with type 2 diabetes mellitus (HCC) (Chronic)    Last set of cholesterol levels showed LDL at 74-with concern for possible progressive angina and CAD, would shoot for LDL closer to 55. Was increased to 40 mg rosuvastatin by PCP.   Labs to be followed up soon => goal to be updated based on cath findings.  Last A1c was 10.9.  Expected that Lauren [name] may very well end up on insulin.  Lauren [name] is on Farxiga and Rybelsus both which have cardiac protection.  Discussed importance of dietary modification . Combination of hypertriglyceridemia, DM-2 and HTN but shows metabolic syndrome.        Relevant Medications   carvedilol (COREG) 3.125 MG tablet   lisinopril (ZESTRIL) 10 MG tablet   aspirin EC 81 MG tablet   Essential hypertension (Chronic)    Blood pressure actually is pretty good today.  Given concern for possible CAD I think we need Lauren [name] start a beta-blocker as well start carvedilol 3.125 mg twice daily.  In order to allow for blood pressure for him, we will reduce Lauren [name] lisinopril to 10 mg daily.      Relevant Medications   carvedilol (COREG) 3.125 MG tablet   lisinopril (ZESTRIL) 10 MG tablet   aspirin EC 81 MG tablet     Other   Type 2 diabetes mellitus with obesity (HCC)   Relevant Medications   lisinopril (ZESTRIL) 10 MG tablet   aspirin EC 81 MG tablet   Other Relevant Orders     Basic metabolic panel   CBC   Tobacco dependence (Chronic)    Smoking cessation instruction/counseling given:  counseled [name] dangers of tobacco use, advised [name] to stop smoking, and reviewed strategies to maximize success;  Receiving cardiac catheterization lab, will be more forceful with this instruction.       Morbid obesity (HCC)   Chronic obstructive pulmonary disease, unspecified COPD type (HCC) (Chronic)    Smoke cessation  counseling provided. Is only on Flonase, and PRN  albuterol      Relevant Orders   Basic metabolic panel   CBC    ===================================  HPI:    Lauren [name] is a 47 y.o. female with PMH notable for HTN, HLD and DM-2 who is being seen today for Lauren evaluation of CHEST PAIN at Lauren request of [name], [name], DO.  Lauren [name] was seen back in 2015 by Dr. Gollan for chest pain -> lost to follow-up.  Lauren [name] was recently by Dr. [name] seen on September 10, 2022 for complaints of chest pain.   Lauren [name] noted gradual onset of chest pain described as left parasternal pressure radiating to both arms.  Few times a week.  Initially with exertion, but also at rest.  Started noticing it when Lauren [name] was hiking or walking Lauren [name] dog.  Now occurs with watching TV. => Increased Crestor to 40 mg daily and ordered lipid panel.  Discussed smoking cessation counseling.  Referred to cardiology  Recent Hospitalizations: None  Reviewed  CV studies:    Lauren following studies were reviewed today: (if available, images/films reviewed: From Epic Chart or Care Everywhere) None:  Interval History:   Lauren [name] presents here today for cardiology evaluation for chest pain.  Lauren [name] still having Lauren chest discomfort Lauren [name] says it usually happens with exertion/exercise rhythm mild walking.  But recently has been happening while watching TV or lying down.  Lauren [name] describes it as a pressure tightening sensation that can last up to a minute or so and it radiates down to both arms.  Usually if Lauren [name] takes nice deep breath it goes down on its own.  Sometimes when Lauren [name] lies down Lauren [name] has orthopnea type symptoms and will also notice Lauren discomfort then.  Oftentimes this occurs especially when lying down and Lauren [name] will noticed heart racing sensation.  Somewhat takes Lauren [name] breath away.  Lauren [name] does not notice Lauren palpitations with exertion however.  When Lauren [name] does have Lauren palpitations at rest Lauren [name] does have Lauren chest discomfort.  Lauren [name] says Lauren [name] is  taken nitroglycerin a couple times since Lauren [name] saw Lauren [name] PCP and it has helped alleviate symptoms.   Lauren [name] has had some occasional episodes of lightheadedness and near syncope but has not had any true syncope.  Lauren [name] did have a fall earlier this month where Lauren [name] lost Lauren [name] balance.  Lauren [name] landed on Lauren [name] left wrist and fractured Lauren [name] forearm/wrist.  Lauren [name] also landed on Lauren [name] back and has a lot of pain when Lauren [name] coughs between Lauren shoulder blades.  Prior to Lauren onset of this chest discomfort episodes, Lauren [name] was not having Lauren palpitations or orthopnea symptoms.  CV Review of Symptoms (Summary): Cardiovascular ROS: positive for - chest pain, dyspnea on exertion, orthopnea, palpitations, rapid heart rate, and notable exercise intolerance and fatigue. negative for - edema, irregular heartbeat, paroxysmal nocturnal dyspnea, or syncope or near syncope, TIA/amaurosis fugax or claudication.  REVIEWED OF SYSTEMS   Review of Systems  Constitutional:  Positive for malaise/fatigue.  HENT:  Negative for congestion.     Respiratory:  Positive for cough (Smoker's cough) and shortness of breath.   Cardiovascular:  Positive for chest pain, palpitations and orthopnea. Negative for leg swelling.  Gastrointestinal:  Negative for abdominal pain, blood in stool and melena.  Genitourinary:  Negative for frequency and hematuria.  Musculoskeletal:  Positive for joint pain (Left wrist).  Neurological:  Positive for dizziness. Negative for focal weakness.  Psychiatric/Behavioral: Negative.      I have reviewed and (if needed) personally updated Lauren [name]'s problem list, medications, allergies, past medical and surgical history, social and family history.   PAST MEDICAL HISTORY   Past Medical History:  Diagnosis Date   Allergic rhinitis    Anxiety    Chronic depression    Diabetes mellitus without complication (HCC)    Elevated LFTs    Genital herpes    GERD (gastroesophageal reflux disease)    Hyperlipidemia    Vitamin D  deficiency     PAST SURGICAL HISTORY   Past Surgical History:  Procedure Laterality Date   APPENDECTOMY     CESAREAN SECTION     x 4    COLONOSCOPY     COLONOSCOPY WITH PROPOFOL N/A 07/27/2020   Procedure: COLONOSCOPY WITH PROPOFOL;  Surgeon: Tahiliani, Varnita B, MD;  Location: [facility] ENDOSCOPY;  Service: Endoscopy;  Laterality: N/A;  COVID POSITIVE 07/05/2020   HERNIA REPAIR  09/25/2011   TUBAL LIGATION     TYMPANOMASTOIDECTOMY Right 12/07/2015   Procedure: TYMPANOMASTOIDECTOMY;  Surgeon: Paul Bennett, MD;  Location: [facility] ORS;  Service: ENT;  Laterality: Right;    MEDICATIONS/ALLERGIES   Current Meds  Medication Sig   albuterol (PROVENTIL HFA) 108 (90 Base) MCG/ACT inhaler Inhale 2 puffs into Lauren lungs every 6 (six) hours as needed for wheezing or shortness of breath.   aspirin EC 81 MG tablet Take 1 tablet (81 mg total) by mouth daily. Swallow whole.   carvedilol (COREG) 3.125 MG tablet Take 1 tablet (3.125 mg total) by mouth 2 (two) times daily.   dapagliflozin propanediol (FARXIGA) 10 MG TABS tablet Take 1 tablet (10 mg total) by mouth daily.   fluticasone (FLONASE) 50 MCG/ACT nasal spray Place 2 sprays into both nostrils daily.   lisinopril (ZESTRIL) 10 MG tablet Take 1 tablet (10 mg total) by mouth daily.   nitroGLYCERIN (NITROSTAT) 0.3 MG SL tablet Place 1 tablet (0.3 mg total) under Lauren tongue every 5 (five) minutes as needed for chest pain.   rosuvastatin (CRESTOR) 40 MG tablet Take 1 tablet (40 mg total) by mouth daily.   Semaglutide (RYBELSUS) 3 MG TABS Take 1 tablet (3 mg total) by mouth daily.   sitaGLIPtin (JANUVIA) 100 MG tablet Take 1 tablet (100 mg total) by mouth daily.   valACYclovir (VALTREX) 1000 MG tablet Take 1 tablet (1,000 mg total) by mouth daily. Take as needed for herpes outbreak.   [DISCONTINUED] lisinopril (ZESTRIL) 20 MG tablet Take 1 tablet (20 mg total) by mouth daily.    Allergies  Allergen Reactions   Cephalexin Other (See Comments)    Secondary  yeast infection   Metformin And Related Nausea And Vomiting    With all doses and regular and extended release forms    SOCIAL HISTORY/FAMILY HISTORY   Reviewed in Epic:   Social History   Tobacco Use   Smoking status: Every Day    Packs/day: 1.00    Years: 28.00    Additional pack years: 0.00    Total pack years: 28.00    Types: Cigarettes   Smokeless tobacco:   Never  Vaping Use   Vaping Use: Never used  Substance Use Topics   Alcohol use: Not Currently    Alcohol/week: 0.0 standard drinks of alcohol   Drug use: No   Social History   Social History Narrative   Not on file   Family History  Problem Relation Age of Onset   Hypertension Mother    Hyperlipidemia Mother    Diabetes Mother    Arrhythmia Father 56       A-fib   Alcohol abuse Father    Breast cancer Maternal Aunt    Heart disease Brother 43       stent x 1    Alcoholism Brother    Asthma Son    Asperger's syndrome Son     OBJCTIVE -PE, EKG, labs   Wt Readings from Last 3 Encounters:  10/15/22 176 lb (79.8 kg)  09/10/22 177 lb 11.2 oz (80.6 kg)  08/31/22 176 lb 8 oz (80.1 kg)    Physical Exam: BP 109/71   Pulse 94   Ht 5' 3" (1.6 m)   Wt 176 lb (79.8 kg)   SpO2 100%   BMI 31.18 kg/m  Physical Exam Vitals reviewed.  Constitutional:      General: Lauren [name] is not in acute distress.    Appearance: Normal appearance. Lauren [name] is obese. Lauren [name] is not ill-appearing or toxic-appearing.  HENT:     Head: Normocephalic and atraumatic.  Eyes:     Extraocular Movements: Extraocular movements intact.     Pupils: Pupils are equal, round, and reactive to light.  Neck:     Vascular: No carotid bruit or JVD.  Cardiovascular:     Rate and Rhythm: Normal rate and regular rhythm. No extrasystoles are present.    Chest Wall: PMI is not displaced.     Pulses: Normal pulses.     Heart sounds: Normal heart sounds, S1 normal and S2 normal. No murmur heard.    No friction rub. No gallop.  Pulmonary:     Effort:  Pulmonary effort is normal. No respiratory distress.     Comments: Mild diffuse interstitial sounds, no wheezes rales or rhonchi. Chest:     Chest wall: No tenderness.  Abdominal:     General: Abdomen is flat. Bowel sounds are normal. There is no distension.     Palpations: Abdomen is soft. There is no mass.     Tenderness: There is no abdominal tenderness. There is no guarding.     Hernia: No hernia is present.     Comments: No HSM or bruit  Musculoskeletal:        General: Deformity (Has a brace on left wrist) present. No swelling.     Cervical back: Normal range of motion and neck supple.  Skin:    General: Skin is warm and dry.  Neurological:     General: No focal deficit present.     Mental Status: Lauren [name] is alert and oriented to person, place, and time.  Psychiatric:        Mood and Affect: Mood normal.        Behavior: Behavior normal.        Thought Content: Thought content normal.        Judgment: Judgment normal.     Comments: Somewhat blunted affect      Adult ECG Report from PCP 08/24/2022  Rate: 79;  Rhythm: normal sinus rhythm and nonspecific ST (T wave changes. ;  Subtle ST depressions in lateral leads, cannot exclude   ischemia.  Narrative Interpretation: Personally reviewed.  Borderline EKG.  Recent Labs: Labs reviewed.  Rosuvastatin dose was increased shortly prior to this. Lab Results  Component Value Date   CHOL 140 08/31/2022   HDL 35 (L) 08/31/2022   LDLCALC 74 08/31/2022   TRIG 215 (H) 08/31/2022   CHOLHDL 4.0 08/31/2022   Lab Results  Component Value Date   CREATININE 0.61 08/31/2022   BUN 9 08/31/2022   NA 140 08/31/2022   K 3.9 08/31/2022   CL 105 08/31/2022   CO2 25 08/31/2022      Latest Ref Rng & Units 08/31/2022    2:37 PM 09/28/2020    3:07 PM 06/27/2020   10:29 AM  CBC  WBC 3.8 - 10.8 Thousand/uL 7.5  7.2  6.9   Hemoglobin 11.7 - 15.5 g/dL 15.6  14.0  14.0   Hematocrit 35.0 - 45.0 % 46.8  42.5  42.1   Platelets 140 - 400 Thousand/uL 149   222  187     Lab Results  Component Value Date   HGBA1C 10.9 (H) 08/31/2022   Lab Results  Component Value Date   TSH 1.21 10/18/2016    ================================================== I spent a total of 29 minutes with Lauren [name] spent in direct [name] consultation.  Additional time spent with chart review  / charting (studies, outside notes, etc): 22 min Total Time: 512n/a min  Current medicines are reviewed at length with Lauren [name] today.  (+/- concerns) n/a2  Notice: This dictation was prepared with Dragon dictation along with smart phrase technology. Any transcriptional errors that result from this process are unintentional and may not be corrected upon review.   Studies Ordered:  Orders Placed This Encounter  Procedures   Basic metabolic panel   CBC   Meds ordered this encounter  Medications   carvedilol (COREG) 3.125 MG tablet    Sig: Take 1 tablet (3.125 mg total) by mouth 2 (two) times daily.    Dispense:  180 tablet    Refill:  3   lisinopril (ZESTRIL) 10 MG tablet    Sig: Take 1 tablet (10 mg total) by mouth daily.    Dispense:  90 tablet    Refill:  3    Dose changes, discontinue previous dose.   aspirin EC 81 MG tablet    Sig: Take 1 tablet (81 mg total) by mouth daily. Swallow whole.    Dispense:  90 tablet    Refill:  3    [name] Instructions / Medication Changes & Studies & Tests Ordered   [name] Instructions  Medication Instructions:     Take aspirin  324 tablet today which =( 4 ) 81 mg  then take one 81 mg starting tomorrow  Decrease Lisinopril  10 mg   ( take 1/2 tablet of 20 mg ) until bottle is empty   Start taking Carvedilol  3.125 mg twice a day   *If you need a refill on your cardiac medications before your next appointment, please call your pharmacy*   Lab Work: BMP CBC If you have labs (blood work) drawn today and your tests are completely normal, you will receive your results only by: MyChart Message (if you have  MyChart) OR A paper copy in Lauren mail If you have any lab test that is abnormal or we need to change your treatment, we will call you to review Lauren results.   Testing/Procedures: Will be schedule at [address] - [place] - go to   Entrance A- [place] Your physician has requested that you have a cardiac catheterization. Cardiac catheterization is used to diagnose and/or treat various heart conditions. Doctors may recommend this procedure for a number of different reasons. Lauren most common reason is to evaluate chest pain. Chest pain can be a symptom of coronary artery disease (CAD), and cardiac catheterization can show whether plaque is narrowing or blocking your heart's arteries. This procedure is also used to evaluate Lauren valves, as well as measure Lauren blood flow and oxygen levels in different parts of your heart.  Please follow instruction as given below.     Follow-Up: At [facility], you and your health needs are our priority.  As part of our continuing mission to provide you with exceptional heart care, we have created designated Provider Care Teams.  These Care Teams include your primary Cardiologist (physician) and Advanced Practice Providers (APPs -  Physician Assistants and Nurse Practitioners) who all work together to provide you with Lauren care you need, when you need it.     Your next appointment:   2 week(s)  Lauren format for your next appointment:   In Person  Provider:   Lancer Thurner, MD  or APP in  [place] office    Other Instructions   [facility] A DEPT OF [place] [id number] [place] [address] Dept: [phone] Loc: [phone]  Lynnleigh M Wrinkle  10/15/2022  You are scheduled for a Cardiac Catheterization on Friday, May 3 with Dr. Daven Montz.  1. Please arrive at Lauren [place] (Main Entrance A) at [facility]: [address] [place], [place] [address] at 5:30 AM (This time is 2 hour(s) before your procedure to ensure your preparation). Free valet parking service is available. You will check in at ADMITTING. Lauren support person will be asked to wait in Lauren waiting room.  It is OK to have someone drop you off and come back when you are ready to be discharged.    Special note: Every effort is made to have your procedure done on time. Please understand that emergencies sometimes delay scheduled procedures.  2. Diet: Do not eat solid foods after midnight.  Lauren [name] may have clear liquids until 5am upon Lauren day of Lauren procedure.  3. Labs: You will need to have blood drawn on today  CBC,BMP , April 29 at [facility] [address], [place]  Open: 8am - 5pm (Lunch 12:30 - 1:30)   Phone: [phone]. You do not need to be fasting.  4. Medication instructions in preparation for your procedure:   Contrast Allergy: No    Stop taking, Lisinopril (Zestril or Prinivil) Friday, May 3,    Do not take Rybesus , Januvia ,  Farxigia   Friday, May 3,  On Lauren morning of your procedure, take your Aspirin 81 mg and any morning medicines NOT listed above.  You may use sips of water.  5. Plan to go home Lauren same day, you will only stay overnight if medically necessary. 6. Bring a current list of your medications and current insurance cards. 7. You MUST have a responsible person to drive you home. 8. Someone MUST be with you Lauren first 24 hours after you arrive home or your discharge will be delayed. 9. Please wear clothes that are easy to get on and off and wear slip-on shoes.  Thank you for allowing us to care for   you!   -- [facility]     Brylie Sneath W. Aydien Majette, MD, MS Clodagh Odenthal, M.D., M.S. Interventional Cardiologist  [facility]  Pager # [phone] Phone # [phone] [address] [place], [place] [address]   Thank you for choosing [facility]!!     

## 2022-10-15 NOTE — Assessment & Plan Note (Signed)
Smoking cessation instruction/counseling given:  counseled patient on the dangers of tobacco use, advised patient to stop smoking, and reviewed strategies to maximize success;  Receiving cardiac catheterization lab, will be more forceful with this instruction.

## 2022-10-15 NOTE — Assessment & Plan Note (Addendum)
She has now had several weeks of progressively worsening frequency and intensity of chest discomfort initially with exertion now with less exertion and even at rest with lying down consistent with angina decubitus. She has diabetes, hypertension and hyperlipidemia and is a smoker.  High risk for this being progressive angina.  We discussed options of GXT, Myoview, coronary CTA or cardiac catheterization discussing all the options with risks and benefits.  Informed decision-making after discussion with the patient, we have we agreed on proceeding with cardiac catheterization as the symptoms would continue despite a negative stress test which we would probably not believe.  Plan: Schedule for Cardiac Catheterization Friday, May 3-later this week. We will add carvedilol 3.25 mg daily and decrease lisinopril to 10 mg daily Insurance nitroglycerin refill placed. Load aspirin 324 mg x 1, then 81 mg daily. Continue increased dose of rosuvastatin 40 mg daily Continue Farxiga, Rybelsus and Januvia, but will need to be held for upcoming procedure  Shared Decision Making/Informed Consent The risks [stroke (1 in 1000), death (1 in 1000), kidney failure [usually temporary] (1 in 500), bleeding (1 in 200), allergic reaction [possibly serious] (1 in 200)], benefits (diagnostic support and management of coronary artery disease) and alternatives of a cardiac catheterization were discussed in detail with Lauren [name] and she is willing to proceed.

## 2022-10-15 NOTE — Assessment & Plan Note (Signed)
Blood pressure actually is pretty good today.  Given concern for possible CAD I think we need [place] start a beta-blocker as well start carvedilol 3.125 mg twice daily.  In order to allow for blood pressure for him, we will reduce her lisinopril to 10 mg daily.

## 2022-10-15 NOTE — Assessment & Plan Note (Signed)
Last set of cholesterol levels showed LDL at 74-with concern for possible progressive angina and CAD, would shoot for LDL closer to 55. Was increased to 40 mg rosuvastatin by PCP.   Labs to be followed up soon => goal to be updated based on cath findings.  Last A1c was 10.9.  Expected that she may very well end up on insulin.  She is on [place] and Rybelsus both which have cardiac protection.  Discussed importance of dietary modification . Combination of hypertriglyceridemia, DM-2 and HTN but shows metabolic syndrome.

## 2022-10-15 NOTE — Patient Instructions (Addendum)
Medication Instructions:     Take aspirin  324 tablet today which =( 4 ) 81 mg  then take one 81 mg starting tomorrow  Decrease Lisinopril  10 mg   ( take 1/2 tablet of 20 mg ) until bottle is empty   Start taking Carvedilol  3.125 mg twice a day   *If you need a refill on your cardiac medications before your next appointment, please call your pharmacy*   Lab Work: BMP CBC If you have labs (blood work) drawn today and your tests are completely normal, you will receive your results only by: MyChart Message (if you have MyChart) OR A paper copy in the mail If you have any lab test that is abnormal or we need to change your treatment, we will call you to review the results.   Testing/Procedures: Will be schedule at [address] [facility] Your physician has requested that you have a cardiac catheterization. Cardiac catheterization is used to diagnose and/or treat various heart conditions. Doctors may recommend this procedure for a number of different reasons. The most common reason is to evaluate chest pain. Chest pain can be a symptom of coronary artery disease (CAD), and cardiac catheterization can show whether plaque is narrowing or blocking your heart's arteries. This procedure is also used to evaluate the valves, as well as measure the blood flow and oxygen levels in different parts of your heart.  Please follow instruction as given below.     Follow-Up: At [facility], you and your health needs are our priority.  As part of our continuing mission to provide you with exceptional heart care, we have created designated Provider Care Teams.  These Care Teams include your primary Cardiologist (physician) and Advanced Practice Providers (APPs -  Physician Assistants and Nurse Practitioners) who all work together to provide you with the care you need, when you need it.     Your next appointment:   2 week(s)  The format for your next  appointment:   In Person  Provider:   Bryan Lemma, MD  or APP in  [facility] office    Other Instructions   [place] [facility] DE[facility] OF [facility] AT [facility] [id number] [place] [address] Dept: [phone] Loc: [phone]  Lauren [name]  10/15/2022  You are scheduled for a Cardiac Catheterization on Friday, May 3 with Dr. Bryan Lemma.  1. Please arrive at the [facility] (Main Entrance A) at [facility]: [address] [place], [place] [address] at 5:30 AM (This time is 2 hour(s) before your procedure to ensure your preparation). Free valet parking service is available. You will check in at ADMITTING. The support person will be asked to wait in the waiting room.  It is OK to have someone drop you off and come back when you are ready to be discharged.    Special note: Every effort is made to have your procedure done on time. Please understand that emergencies sometimes delay scheduled procedures.  2. Diet: Do not eat solid foods after midnight.  The patient may have clear liquids until 5am upon the day of the procedure.  3. Labs: You will need to have blood drawn on today  CBC,BMP , April 29 at [facility] [address], [place]  Open: 8am - 5pm (Lunch 12:30 - 1:30)   Phone: [phone]. You do not need  to be fasting.  4. Medication instructions in preparation for your procedure:   Contrast Allergy: No    Stop taking, Lisinopril (Zestril or Prinivil) Friday, May 3,    Do not take Rybesus , Tiburcio Bash   Friday, May 3,  On the morning of your procedure, take your Aspirin 81 mg and any morning medicines NOT listed above.  You may use sips of water.  5. Plan to go home the same day, you will only stay overnight if medically necessary. 6. Bring a current list of your medications and current insurance cards. 7. You MUST have a responsible person to  drive you home. 8. Someone MUST be with you the first 24 hours after you arrive home or your discharge will be delayed. 9. Please wear clothes that are easy to get on and off and wear slip-on shoes.  Thank you for allowing [place] to care for you!   -- [facility]

## 2022-10-15 NOTE — Assessment & Plan Note (Addendum)
Smoke cessation counseling provided. Is only on Flonase, and PRN  albuterol

## 2022-10-16 ENCOUNTER — Other Ambulatory Visit: Payer: Self-pay | Admitting: *Deleted

## 2022-10-16 DIAGNOSIS — I200 Unstable angina: Secondary | ICD-10-CM

## 2022-10-16 LAB — BASIC METABOLIC PANEL
BUN/Creatinine Ratio: 21 (ref 9–23)
BUN: 12 mg/dL (ref 6–24)
CO2: 19 mmol/L — ABNORMAL LOW (ref 20–29)
Calcium: 9.8 mg/dL (ref 8.7–10.2)
Chloride: 99 mmol/L (ref 96–106)
Creatinine, Ser: 0.58 mg/dL (ref 0.57–1.00)
Glucose: 173 mg/dL — ABNORMAL HIGH (ref 70–99)
Potassium: 4.3 mmol/L (ref 3.5–5.2)
Sodium: 137 mmol/L (ref 134–144)
eGFR: 112 mL/min/{1.73_m2} (ref >59)

## 2022-10-16 LAB — CBC
Hematocrit: 47.8 % — ABNORMAL HIGH (ref 34.0–46.6)
Hemoglobin: 15.9 g/dL (ref 11.1–15.9)
MCH: 30.5 pg (ref 26.6–33.0)
MCHC: 33.3 g/dL (ref 31.5–35.7)
MCV: 92 fL (ref 79–97)
Platelets: 193 10*3/uL (ref 150–450)
RBC: 5.22 x10E6/uL (ref 3.77–5.28)
RDW: 13.4 % (ref 11.7–15.4)
WBC: 10.6 10*3/uL (ref 3.4–10.8)

## 2022-10-16 NOTE — Progress Notes (Signed)
Order placed for left heart cath 

## 2022-10-17 DIAGNOSIS — Z419 Encounter for procedure for purposes other than remedying health state, unspecified: Secondary | ICD-10-CM | POA: Diagnosis not present

## 2022-10-18 ENCOUNTER — Telehealth: Payer: Self-pay | Admitting: *Deleted

## 2022-10-18 NOTE — Telephone Encounter (Signed)
Cardiac Catheterization scheduled at [facility] for: Friday Oct 19, 2022 7:30 AM Arrival time [facility] Main Entrance A at: 5:30 AM  Nothing to eat after midnight prior to procedure, clear liquids until 5 AM day of procedure.  Medication instructions: -Hold:  Rybelsus-AM of procedure  Pt reports does not take [place] in AM/not currently taking Januvia -Other usual morning medications can be taken with sips of water including aspirin 81 mg.  Confirmed patient has responsible adult to drive home post procedure and be with patient first 24 hours after arriving home.  Plan to go home the same day, you will only stay overnight if medically necessary.  Reviewed procedure instructions with patient

## 2022-10-19 ENCOUNTER — Inpatient Hospital Stay (HOSPITAL_COMMUNITY): Payer: 59

## 2022-10-19 ENCOUNTER — Other Ambulatory Visit: Payer: Self-pay

## 2022-10-19 ENCOUNTER — Inpatient Hospital Stay (HOSPITAL_COMMUNITY)
Admission: RE | Admit: 2022-10-19 | Discharge: 2022-10-26 | DRG: 234 | Disposition: A | Discharge status: Home / Self Care | Payer: 59 | Attending: Thoracic Surgery (Cardiothoracic Vascular Surgery) | Admitting: Thoracic Surgery (Cardiothoracic Vascular Surgery)

## 2022-10-19 ENCOUNTER — Inpatient Hospital Stay (HOSPITAL_COMMUNITY)
Admission: RE | Disposition: A | Discharge status: Home / Self Care | Payer: Self-pay | Source: Home / Self Care | Attending: Thoracic Surgery (Cardiothoracic Vascular Surgery)

## 2022-10-19 DIAGNOSIS — E119 Type 2 diabetes mellitus without complications: Secondary | ICD-10-CM | POA: Diagnosis not present

## 2022-10-19 DIAGNOSIS — J309 Allergic rhinitis, unspecified: Secondary | ICD-10-CM | POA: Diagnosis present

## 2022-10-19 DIAGNOSIS — Z86010 Personal history of colonic polyps: Secondary | ICD-10-CM

## 2022-10-19 DIAGNOSIS — Z881 Allergy status to other antibiotic agents status: Secondary | ICD-10-CM

## 2022-10-19 DIAGNOSIS — D72829 Elevated white blood cell count, unspecified: Secondary | ICD-10-CM | POA: Diagnosis not present

## 2022-10-19 DIAGNOSIS — I10 Essential (primary) hypertension: Secondary | ICD-10-CM | POA: Diagnosis present

## 2022-10-19 DIAGNOSIS — K219 Gastro-esophageal reflux disease without esophagitis: Secondary | ICD-10-CM | POA: Diagnosis present

## 2022-10-19 DIAGNOSIS — Z7984 Long term (current) use of oral hypoglycemic drugs: Secondary | ICD-10-CM | POA: Diagnosis not present

## 2022-10-19 DIAGNOSIS — Z9851 Tubal ligation status: Secondary | ICD-10-CM | POA: Diagnosis not present

## 2022-10-19 DIAGNOSIS — I25110 Atherosclerotic heart disease of native coronary artery with unstable angina pectoris: Secondary | ICD-10-CM | POA: Diagnosis not present

## 2022-10-19 DIAGNOSIS — Z833 Family history of diabetes mellitus: Secondary | ICD-10-CM

## 2022-10-19 DIAGNOSIS — Z888 Allergy status to other drugs, medicaments and biological substances status: Secondary | ICD-10-CM

## 2022-10-19 DIAGNOSIS — E785 Hyperlipidemia, unspecified: Secondary | ICD-10-CM | POA: Diagnosis present

## 2022-10-19 DIAGNOSIS — F17210 Nicotine dependence, cigarettes, uncomplicated: Secondary | ICD-10-CM | POA: Diagnosis present

## 2022-10-19 DIAGNOSIS — I2510 Atherosclerotic heart disease of native coronary artery without angina pectoris: Secondary | ICD-10-CM | POA: Diagnosis present

## 2022-10-19 DIAGNOSIS — F32A Depression, unspecified: Secondary | ICD-10-CM | POA: Diagnosis present

## 2022-10-19 DIAGNOSIS — J449 Chronic obstructive pulmonary disease, unspecified: Secondary | ICD-10-CM | POA: Diagnosis present

## 2022-10-19 DIAGNOSIS — E669 Obesity, unspecified: Secondary | ICD-10-CM | POA: Diagnosis not present

## 2022-10-19 DIAGNOSIS — Z83438 Family history of other disorder of lipoprotein metabolism and other lipidemia: Secondary | ICD-10-CM

## 2022-10-19 DIAGNOSIS — Z951 Presence of aortocoronary bypass graft: Secondary | ICD-10-CM

## 2022-10-19 DIAGNOSIS — Z8249 Family history of ischemic heart disease and other diseases of the circulatory system: Secondary | ICD-10-CM

## 2022-10-19 DIAGNOSIS — I200 Unstable angina: Secondary | ICD-10-CM | POA: Diagnosis not present

## 2022-10-19 DIAGNOSIS — Z8616 Personal history of COVID-19: Secondary | ICD-10-CM | POA: Diagnosis not present

## 2022-10-19 DIAGNOSIS — E8770 Fluid overload, unspecified: Secondary | ICD-10-CM | POA: Diagnosis present

## 2022-10-19 DIAGNOSIS — E872 Acidosis, unspecified: Secondary | ICD-10-CM | POA: Diagnosis present

## 2022-10-19 DIAGNOSIS — Z7982 Long term (current) use of aspirin: Secondary | ICD-10-CM | POA: Diagnosis not present

## 2022-10-19 DIAGNOSIS — Z79899 Other long term (current) drug therapy: Secondary | ICD-10-CM

## 2022-10-19 DIAGNOSIS — D62 Acute posthemorrhagic anemia: Secondary | ICD-10-CM | POA: Diagnosis not present

## 2022-10-19 DIAGNOSIS — Z6832 Body mass index (BMI) 32.0-32.9, adult: Secondary | ICD-10-CM

## 2022-10-19 DIAGNOSIS — E782 Mixed hyperlipidemia: Secondary | ICD-10-CM | POA: Diagnosis not present

## 2022-10-19 DIAGNOSIS — Z9049 Acquired absence of other specified parts of digestive tract: Secondary | ICD-10-CM | POA: Diagnosis not present

## 2022-10-19 DIAGNOSIS — Z720 Tobacco use: Secondary | ICD-10-CM | POA: Diagnosis not present

## 2022-10-19 DIAGNOSIS — E1165 Type 2 diabetes mellitus with hyperglycemia: Secondary | ICD-10-CM | POA: Diagnosis present

## 2022-10-19 DIAGNOSIS — E1149 Type 2 diabetes mellitus with other diabetic neurological complication: Secondary | ICD-10-CM | POA: Diagnosis not present

## 2022-10-19 DIAGNOSIS — A6009 Herpesviral infection of other urogenital tract: Secondary | ICD-10-CM | POA: Diagnosis present

## 2022-10-19 HISTORY — PX: TRANSTHORACIC ECHOCARDIOGRAM: SHX275

## 2022-10-19 HISTORY — PX: LEFT HEART CATH AND CORONARY ANGIOGRAPHY: CATH118249

## 2022-10-19 HISTORY — DX: Atherosclerotic heart disease of native coronary artery without angina pectoris: I25.10

## 2022-10-19 LAB — PULMONARY FUNCTION TEST
FEF 25-75 Pre: 0.31 L/sec
FEF2575-%Pred-Pre: 11 %
FEV1-%Pred-Pre: 60 %
FEV1-Pre: 1.68 L
FEV1FVC-%Pred-Pre: 51 %
FEV6-%Pred-Pre: 84 %
FEV6-Pre: 2.86 L
FEV6FVC-%Pred-Pre: 72 %
FVC-%Pred-Pre: 115 %
FVC-Pre: 4.02 L
Pre FEV1/FVC ratio: 42 %
Pre FEV6/FVC Ratio: 71 %

## 2022-10-19 LAB — GLUCOSE, CAPILLARY
Glucose-Capillary: 151 mg/dL — ABNORMAL HIGH (ref 70–99)
Glucose-Capillary: 98 mg/dL (ref 70–99)
Glucose-Capillary: 171 mg/dL — ABNORMAL HIGH (ref 70–99)

## 2022-10-19 LAB — ECHOCARDIOGRAM COMPLETE
Area-P 1/2: 3.77 cm2
Height: 63 in
S' Lateral: 3.9 cm
Weight: 2816 oz

## 2022-10-19 SURGERY — LEFT HEART CATH AND CORONARY ANGIOGRAPHY
Anesthesia: LOCAL

## 2022-10-19 MED ORDER — POTASSIUM CHLORIDE 2 MEQ/ML IV SOLN
80.0000 meq | INTRAVENOUS | Status: DC
  Filled 2022-10-19: qty 40

## 2022-10-19 MED ORDER — NOREPINEPHRINE 4 MG/250ML-% IV SOLN
0.0000 ug/min | INTRAVENOUS | Status: DC
  Filled 2022-10-19: qty 250

## 2022-10-19 MED ORDER — VERAPAMIL HCL 2.5 MG/ML IV SOLN
INTRAVENOUS | Status: AC
  Filled 2022-10-19: qty 2

## 2022-10-19 MED ORDER — MIDAZOLAM HCL 2 MG/2ML IJ SOLN
INTRAMUSCULAR | Status: DC | PRN
  Administered 2022-10-19: 2 mg via INTRAVENOUS

## 2022-10-19 MED ORDER — FENTANYL CITRATE (PF) 100 MCG/2ML IJ SOLN
INTRAMUSCULAR | Status: AC
  Filled 2022-10-19: qty 2

## 2022-10-19 MED ORDER — SODIUM CHLORIDE 0.9 % IV SOLN: 250.0000 mL | INTRAVENOUS | Status: DC | PRN

## 2022-10-19 MED ORDER — NITROGLYCERIN IN D5W 200-5 MCG/ML-% IV SOLN: 0.0000 ug/min | INTRAVENOUS | Status: DC

## 2022-10-19 MED ORDER — NITROGLYCERIN IN D5W 200-5 MCG/ML-% IV SOLN
2.0000 ug/min | INTRAVENOUS | Status: DC
  Filled 2022-10-19: qty 250

## 2022-10-19 MED ORDER — ROSUVASTATIN CALCIUM 20 MG PO TABS
40.0000 mg | ORAL_TABLET | Freq: Every day | ORAL | Status: DC
  Administered 2022-10-20 – 2022-10-21 (×2): 40 mg via ORAL
  Filled 2022-10-19 (×2): qty 2

## 2022-10-19 MED ORDER — NITROGLYCERIN 0.3 MG SL SUBL: 0.3000 mg | SUBLINGUAL_TABLET | SUBLINGUAL | Status: DC | PRN

## 2022-10-19 MED ORDER — ASPIRIN 81 MG PO TBEC
81.0000 mg | DELAYED_RELEASE_TABLET | Freq: Every day | ORAL | Status: DC
  Administered 2022-10-20 – 2022-10-21 (×2): 81 mg via ORAL
  Filled 2022-10-19 (×2): qty 1

## 2022-10-19 MED ORDER — MILRINONE LACTATE IN DEXTROSE 20-5 MG/100ML-% IV SOLN
0.3000 ug/kg/min | INTRAVENOUS | Status: DC
  Filled 2022-10-19: qty 100

## 2022-10-19 MED ORDER — CARVEDILOL 3.125 MG PO TABS
3.1250 mg | ORAL_TABLET | Freq: Two times a day (BID) | ORAL | Status: DC
  Administered 2022-10-19 – 2022-10-21 (×5): 3.125 mg via ORAL
  Filled 2022-10-19 (×5): qty 1

## 2022-10-19 MED ORDER — PLASMA-LYTE A IV SOLN
INTRAVENOUS | Status: DC
  Filled 2022-10-19: qty 2.5

## 2022-10-19 MED ORDER — CEFAZOLIN SODIUM-DEXTROSE 2-4 GM/100ML-% IV SOLN
2.0000 g | INTRAVENOUS | Status: AC
  Administered 2022-10-22: 2 g via INTRAVENOUS
  Filled 2022-10-19: qty 100

## 2022-10-19 MED ORDER — EPINEPHRINE HCL 5 MG/250ML IV SOLN IN NS
0.0000 ug/min | INTRAVENOUS | Status: DC
  Filled 2022-10-19: qty 250

## 2022-10-19 MED ORDER — ONDANSETRON HCL 4 MG/2ML IJ SOLN: 4.0000 mg | Freq: Four times a day (QID) | INTRAMUSCULAR | Status: DC | PRN

## 2022-10-19 MED ORDER — FENTANYL CITRATE (PF) 100 MCG/2ML IJ SOLN
INTRAMUSCULAR | Status: DC | PRN
  Administered 2022-10-19: 25 ug via INTRAVENOUS

## 2022-10-19 MED ORDER — MIDAZOLAM HCL 2 MG/2ML IJ SOLN
INTRAMUSCULAR | Status: AC
  Filled 2022-10-19: qty 2

## 2022-10-19 MED ORDER — HYDRALAZINE HCL 20 MG/ML IJ SOLN: 10.0000 mg | INTRAMUSCULAR | Status: AC | PRN

## 2022-10-19 MED ORDER — HEPARIN SODIUM (PORCINE) 1000 UNIT/ML IJ SOLN
INTRAMUSCULAR | Status: AC
  Filled 2022-10-19: qty 10

## 2022-10-19 MED ORDER — HEPARIN SODIUM (PORCINE) 1000 UNIT/ML IJ SOLN
INTRAMUSCULAR | Status: DC | PRN
  Administered 2022-10-19: 4000 [IU] via INTRAVENOUS

## 2022-10-19 MED ORDER — SODIUM CHLORIDE 0.9 % WEIGHT BASED INFUSION
3.0000 mL/kg/h | INTRAVENOUS | Status: DC
  Administered 2022-10-19: 3 mL/kg/h via INTRAVENOUS

## 2022-10-19 MED ORDER — HEPARIN (PORCINE) IN NACL 1000-0.9 UT/500ML-% IV SOLN
INTRAVENOUS | Status: DC | PRN
  Administered 2022-10-19 (×2): 500 mL

## 2022-10-19 MED ORDER — INSULIN ASPART 100 UNIT/ML IJ SOLN
0.0000 [IU] | Freq: Three times a day (TID) | INTRAMUSCULAR | Status: DC
  Administered 2022-10-19 – 2022-10-20 (×2): 2 [IU] via SUBCUTANEOUS
  Administered 2022-10-20: 5 [IU] via SUBCUTANEOUS
  Administered 2022-10-21 (×2): 2 [IU] via SUBCUTANEOUS

## 2022-10-19 MED ORDER — LIDOCAINE HCL (PF) 1 % IJ SOLN
INTRAMUSCULAR | Status: DC | PRN
  Administered 2022-10-19: 2 mL via INTRADERMAL

## 2022-10-19 MED ORDER — LIDOCAINE HCL (PF) 1 % IJ SOLN
INTRAMUSCULAR | Status: AC
  Filled 2022-10-19: qty 30

## 2022-10-19 MED ORDER — PHENYLEPHRINE HCL-NACL 20-0.9 MG/250ML-% IV SOLN
30.0000 ug/min | INTRAVENOUS | Status: AC
  Administered 2022-10-22: 25 ug/min via INTRAVENOUS
  Filled 2022-10-19: qty 250

## 2022-10-19 MED ORDER — B COMPLEX VITAMINS PO CAPS: 1.0000 | ORAL_CAPSULE | Freq: Every day | ORAL | Status: DC

## 2022-10-19 MED ORDER — IOHEXOL 350 MG/ML SOLN
INTRAVENOUS | Status: DC | PRN
  Administered 2022-10-19: 60 mL

## 2022-10-19 MED ORDER — LABETALOL HCL 5 MG/ML IV SOLN: 10.0000 mg | INTRAVENOUS | Status: AC | PRN

## 2022-10-19 MED ORDER — ACETAMINOPHEN 325 MG PO TABS: 650.0000 mg | ORAL_TABLET | ORAL | Status: DC | PRN

## 2022-10-19 MED ORDER — B COMPLEX-C PO TABS
1.0000 | ORAL_TABLET | Freq: Every day | ORAL | Status: DC
  Administered 2022-10-20 – 2022-10-21 (×2): 1 via ORAL
  Filled 2022-10-19 (×3): qty 1

## 2022-10-19 MED ORDER — TRANEXAMIC ACID 1000 MG/10ML IV SOLN
1.5000 mg/kg/h | INTRAVENOUS | Status: AC
  Administered 2022-10-22: 1.5 mg/kg/h via INTRAVENOUS
  Filled 2022-10-19: qty 25

## 2022-10-19 MED ORDER — SODIUM CHLORIDE 0.9% FLUSH: 3.0000 mL | INTRAVENOUS | Status: DC | PRN

## 2022-10-19 MED ORDER — HEPARIN 30,000 UNITS/1000 ML (OHS) CELLSAVER SOLUTION
Status: DC
  Filled 2022-10-19: qty 1000

## 2022-10-19 MED ORDER — LORATADINE 10 MG PO TABS
10.0000 mg | ORAL_TABLET | Freq: Every day | ORAL | Status: DC
  Administered 2022-10-19 – 2022-10-21 (×3): 10 mg via ORAL
  Filled 2022-10-19 (×3): qty 1

## 2022-10-19 MED ORDER — MANNITOL 20 % IV SOLN
INTRAVENOUS | Status: DC
  Filled 2022-10-19: qty 13

## 2022-10-19 MED ORDER — TRANEXAMIC ACID (OHS) BOLUS VIA INFUSION
15.0000 mg/kg | INTRAVENOUS | Status: AC
  Administered 2022-10-22: 1197 mg via INTRAVENOUS
  Filled 2022-10-19: qty 1197

## 2022-10-19 MED ORDER — ASPIRIN 81 MG PO CHEW: 81.0000 mg | CHEWABLE_TABLET | Freq: Once | ORAL | Status: DC

## 2022-10-19 MED ORDER — SODIUM CHLORIDE 0.9% FLUSH
3.0000 mL | Freq: Two times a day (BID) | INTRAVENOUS | Status: DC
  Administered 2022-10-20 – 2022-10-21 (×4): 3 mL via INTRAVENOUS

## 2022-10-19 MED ORDER — SODIUM CHLORIDE 0.9 % WEIGHT BASED INFUSION
1.0000 mL/kg/h | INTRAVENOUS | Status: AC
  Administered 2022-10-19: 1 mL/kg/h via INTRAVENOUS

## 2022-10-19 MED ORDER — DEXMEDETOMIDINE HCL IN NACL 400 MCG/100ML IV SOLN
0.1000 ug/kg/h | INTRAVENOUS | Status: AC
  Administered 2022-10-22: .4 ug/kg/h via INTRAVENOUS
  Filled 2022-10-19: qty 100

## 2022-10-19 MED ORDER — OMEPRAZOLE MAGNESIUM 20 MG PO TBEC: 20.0000 mg | DELAYED_RELEASE_TABLET | Freq: Every day | ORAL | Status: DC | PRN

## 2022-10-19 MED ORDER — TRANEXAMIC ACID (OHS) PUMP PRIME SOLUTION
2.0000 mg/kg | INTRAVENOUS | Status: DC
  Filled 2022-10-19 (×2): qty 1.6

## 2022-10-19 MED ORDER — HEPARIN (PORCINE) 25000 UT/250ML-% IV SOLN
1600.0000 [IU]/h | INTRAVENOUS | Status: DC
  Administered 2022-10-19: 1100 [IU]/h via INTRAVENOUS
  Administered 2022-10-20: 1450 [IU]/h via INTRAVENOUS
  Administered 2022-10-21: 1600 [IU]/h via INTRAVENOUS
  Filled 2022-10-19 (×4): qty 250

## 2022-10-19 MED ORDER — INSULIN REGULAR(HUMAN) IN NACL 100-0.9 UT/100ML-% IV SOLN
INTRAVENOUS | Status: AC
  Administered 2022-10-22: 3 [IU]/h via INTRAVENOUS
  Filled 2022-10-19: qty 100

## 2022-10-19 MED ORDER — DAPAGLIFLOZIN PROPANEDIOL 10 MG PO TABS
10.0000 mg | ORAL_TABLET | Freq: Every day | ORAL | Status: DC
  Administered 2022-10-19 – 2022-10-21 (×3): 10 mg via ORAL
  Filled 2022-10-19 (×3): qty 1

## 2022-10-19 MED ORDER — NITROGLYCERIN 0.4 MG SL SUBL: 0.4000 mg | SUBLINGUAL_TABLET | SUBLINGUAL | Status: DC | PRN

## 2022-10-19 MED ORDER — VANCOMYCIN HCL 1250 MG/250ML IV SOLN
1250.0000 mg | INTRAVENOUS | Status: AC
  Administered 2022-10-22: 1250 mg via INTRAVENOUS
  Filled 2022-10-19: qty 250

## 2022-10-19 MED ORDER — LISINOPRIL 10 MG PO TABS
10.0000 mg | ORAL_TABLET | Freq: Every day | ORAL | Status: DC
  Administered 2022-10-20 – 2022-10-21 (×2): 10 mg via ORAL
  Filled 2022-10-19 (×2): qty 1

## 2022-10-19 MED ORDER — FLUTICASONE PROPIONATE 50 MCG/ACT NA SUSP: 1.0000 | Freq: Every day | NASAL | Status: DC | PRN

## 2022-10-19 MED ORDER — SODIUM CHLORIDE 0.9 % WEIGHT BASED INFUSION: 1.0000 mL/kg/h | INTRAVENOUS | Status: DC

## 2022-10-19 MED ORDER — SODIUM CHLORIDE 0.9% FLUSH: 3.0000 mL | Freq: Two times a day (BID) | INTRAVENOUS | Status: DC

## 2022-10-19 MED ORDER — SEMAGLUTIDE 3 MG PO TABS: 3.0000 mg | ORAL_TABLET | Freq: Every day | ORAL | Status: DC

## 2022-10-19 SURGICAL SUPPLY — 12 items
CATH INFINITI 5 FR JL3.5 (CATHETERS) IMPLANT
CATH OPTITORQUE TIG 4.0 5F (CATHETERS) IMPLANT
DEVICE RAD COMP TR BAND LRG (VASCULAR PRODUCTS) IMPLANT
GLIDESHEATH SLEND SS 6F .021 (SHEATH) IMPLANT
GUIDEWIRE INQWIRE 1.5J.035X260 (WIRE) IMPLANT
INQWIRE 1.5J .035X260CM (WIRE) ×1
KIT HEART LEFT (KITS) ×2 IMPLANT
PACK CARDIAC CATHETERIZATION (CUSTOM PROCEDURE TRAY) ×2 IMPLANT
SHEATH PROBE COVER 6X72 (BAG) IMPLANT
TRANSDUCER W/STOPCOCK (MISCELLANEOUS) ×2 IMPLANT
TUBING CIL FLEX 10 FLL-RA (TUBING) ×2 IMPLANT
WIRE HI TORQ VERSACORE-J 145CM (WIRE) IMPLANT

## 2022-10-19 NOTE — Interval H&P Note (Signed)
History and Physical Interval Note:  10/19/2022 7:48 AM  Lauren [name]  has presented today for surgery, with the diagnosis of progressive angina.    The various methods of treatment have been discussed with the patient and family. After consideration of risks, benefits and other options for treatment, the patient has consented to  Procedure(s): LEFT HEART CATH AND CORONARY ANGIOGRAPHY (N/A)  PERCUTANEOUS CORONARY INTERVENTION  as a surgical intervention.  The patient's history has been reviewed, patient examined, no change in status, stable for surgery.  I have reviewed the patient's chart and labs.  Questions were answered to the patient's satisfaction.    Cath Lab Visit (complete for each Cath Lab visit)  Clinical Evaluation Leading to the Procedure:   ACS: No.  Non-ACS:    Anginal Classification: CCS III  Anti-ischemic medical therapy: Minimal Therapy (1 class of medications)  Non-Invasive Test Results: No non-invasive testing performed  Prior CABG: No previous CABG   Bryan Lemma

## 2022-10-19 NOTE — Progress Notes (Signed)
ANTICOAGULATION CONSULT NOTE  Pharmacy Consult for heparin Indication: chest pain/ACS  Allergies  Allergen Reactions   Cephalexin Other (See Comments)    Secondary yeast infection   Metformin And Related Nausea And Vomiting    With all doses and regular and extended release forms    Patient Measurements: Height: 5\' 3"  (160 cm) Weight: 79.8 kg (176 lb) IBW/kg (Calculated) : 52.4 Heparin Dosing Weight: 79kg  Vital Signs: Temp: 98.1 F (36.7 C) (05/03 0541) Temp Source: Temporal (05/03 0541) BP: 121/79 (05/03 1340) Pulse Rate: 62 (05/03 1340)  Labs: No results for input(s): "HGB", "HCT", "PLT", "APTT", "LABPROT", "INR", "HEPARINUNFRC", "HEPRLOWMOCWT", "CREATININE", "CKTOTAL", "CKMB", "TROPONINIHS" in the last 72 hours.  Estimated Creatinine Clearance: 87 mL/min (by C-G formula based on SCr of 0.58 mg/dL).   Medical History: Past Medical History:  Diagnosis Date   Allergic rhinitis    Anxiety    Chronic depression    Diabetes mellitus without complication (HCC)    Elevated LFTs    Genital herpes    GERD (gastroesophageal reflux disease)    Hyperlipidemia    Vitamin D deficiency      Assessment: 81 yoF admitted for cath with mvCAD, to start IV heparin once TR band removed.  Goal of Therapy:  Heparin level 0.3-0.7 units/ml Monitor platelets by anticoagulation protocol: Yes   Plan:  Heparin 1100 units/h starting 2h after TR band removed  Fredonia Highland, PharmD, BCPS, [facility] Clinical Pharmacist [phone] Please check AMION for all [facility] Pharmacy numbers 10/19/2022

## 2022-10-19 NOTE — Brief Op Note (Signed)
    BRIEF CARDIAC CATHETERIZATION REPORT  PATIENT: Lauren [name]    MRN: [id number] 10/19/2022 10:09 AM  Primary Care Provider: Danelle Berry, PA-C [facility] Cardiologist: Bryan Lemma, MD  PROCEDURE:  Procedure(s): LEFT HEART CATH AND CORONARY ANGIOGRAPHY (N/A)  SURGEON:  Surgeon(s) and Role:    * Marykay Lex, MD - Primary  PATIENT:  Lauren [name] is a 47 y.o. female  with PMH notable for HTN, HLD and DM-2 who was seen on April 29 for the evaluation of CHEST PAIN concerning for progressive angina.  She has been having ongoing episodes of exertional chest discomfort described as a pressure sensation associated with dyspnea and a tightness down her arm lasting several minutes.  These episodes initially began with exertion and have progressed to be sometimes at rest.  With a concerning diagnosis of progressive angina she is referred for cardiac catheterization  PRE-OPERATIVE DIAGNOSIS: PROGRESSIVE ANGINA  POST-OPERATIVE DIAGNOSIS:   Low normal EF roughly 50 to 55%.  Difficult to assess wall motion due to ectopy.  Will check 2D echo. Severe multivessel CAD: Distal left main 90% heavily calcified eccentric. Mid LAD eccentric 30%, otherwise the LAD has several small diagonal branches and tapers into a relatively small caliber vessel that reaches the apex. The LCx courses essentially has a lateral OM branch with several small branches that bifurcates distally.  Inferior branch has a 95% stenosis Mid RCA focal eccentric 95% subtotal occlusion; RCA bifurcates into RPA V and PDA There is a 90% stenosis at the RPA V-RPL 1 bifurcation  PROCEDURE PERFORMED Time Out: Verified patient identification, verified procedure, site/side was marked, verified correct patient position, special equipment/implants available, medications/allergies/relevent history reviewed, required imaging and test results available. Performed.  Access:  Right Ulnar Artery: 6 Fr sheath -- Seldinger technique  using Micropuncture Kit -- Direct ultrasound guidance used.  Permanent image obtained and placed on chart. -- 10 mL radial cocktail IA; 4000 Units IV Heparin  Left Heart Catheterization: 5Fr Catheters advanced or exchanged over a J-wire under direct fluoroscopic guidance into the ascending aorta; TIG 4.0 catheter advanced first.  * LV Hemodynamics (LV Gram): TIG 4.0 catheter * Left Coronary Artery Cineangiography: TIG 4.0 catheter exchanged for JL 3.5 catheter  * Right Coronary Artery Cineangiography: TIG 4.0 catheter   Review of initial angiography revealed: Severe multivessel disease involving distal left main and mid RCA 90 to 95% stenoses.  Preparations are made for admission for CVTS consultation  Upon completion of Angiogaphy, the catheter was removed completely out of the body over a wire, without complication.  Radial sheath removed in the Cardiac Catheterization lab with TR Band placed for hemostasis.  TR Band: 1005 Hours; 12 mL air; reverse Barbeau level B  MEDICATIONS SQ Lidocaine 3 mL Radial Cocktail: 3 mg Verapmil in 10 mL NS Heparin: 4000 units   ANESTHESIA:   IV sedation 2 mg IV Versed and 25 mcg IV fentanyl  EBL:  <50 mL  PATIENT DISPOSITION:  PACU - hemodynamically stable.  DICTATION: .Note written in EPIC  PLAN OF CARE: Admit to inpatient  -> will initiate low-dose IV nitroglycerin infusion as well as start IV heparin 2 hours after TR band removal based on distal left main disease.  CVTS consulted-will hope for urgent CABG -> will help for Monday, but potentially Saturday or Sunday if she destabilizes. Will titrate GDMT over the weekend.   Bryan Lemma, MD

## 2022-10-19 NOTE — Progress Notes (Signed)
  Echocardiogram 2D Echocardiogram has been performed.  Delcie Roch 10/19/2022, 11:39 AM

## 2022-10-19 NOTE — Consult Note (Addendum)
[address]       [place] [address]             [phone]        LYNDELL WENCK [facility] Medical Record #[id number] Date of Birth: 01/18/1976  Referring: Lauren [name] Primary Care: Lauren Berry, PA-C Primary Cardiologist:Lauren Lauren Baltimore, MD  Chief Complaint:   CAD  History of Present Illness:      Lauren [name] is a 47 yo obese female with history of HTN, HLD, and Type 2 DM.  She presented to her Primary care physician with complaints of chest pain.  This had been occurring for months and she described the pain as mainly being exertional but had progressed to occurring at rest.  She describes it as pressure and it radiated down both arms.  Due to these symptoms she was referred to Dr. Herbie [name] who recommended the patient undergo catheterization.  This was performed today and showed multivessel CAD with LM involvement.  It was felt coronary bypass grafting would be indicated and she was admitted, started on IV Heparin and NTG.  Currently the patient is chest okay.  She does state that recently after she eats she notices she has chest discomfort.  This would not relieve with antacids.  She has been requiring NTG recently to help with chest pain.  She states she has not had to use more than 2 tablets.  Her symptoms were first noted about 2 months ago while hiking in [place] and she was worried she wouldn't get back down the mountain due to her discomfort.  She is a smoker of about 1 pack per day.  She used to be fairly active and would go to the gym 30 min per day, however she is no longer doing this.  She has a positive family history of CAD noting her dad and brother have both had early heart disease.  She does not wish to stay in the hospital prior to surgery.  She wants to go home.  Current Activity/ Functional Status: Patient is independent with mobility/ambulation, transfers, ADL's, IADL's.   Past Medical History:  Diagnosis Date   Allergic rhinitis    Anxiety    Chronic  depression    Diabetes mellitus without complication (HCC)    Elevated LFTs    Genital herpes    GERD (gastroesophageal reflux disease)    Hyperlipidemia    Vitamin D deficiency     Past Surgical History:  Procedure Laterality Date   APPENDECTOMY     CESAREAN SECTION     x 4    COLONOSCOPY     COLONOSCOPY WITH PROPOFOL N/A 07/27/2020   Procedure: COLONOSCOPY WITH PROPOFOL;  Surgeon: Pasty Spillers, MD;  Location: [facility] ENDOSCOPY;  Service: Endoscopy;  Laterality: N/A;  COVID POSITIVE 07/05/2020   HERNIA REPAIR  09/25/2011   TUBAL LIGATION     TYMPANOMASTOIDECTOMY Right 12/07/2015   Procedure: TYMPANOMASTOIDECTOMY;  Surgeon: Geanie Logan, MD;  Location: [facility] ORS;  Service: ENT;  Laterality: Right;    Social History   Tobacco Use  Smoking Status Every Day   Packs/day: 1.00   Years: 28.00   Additional pack years: 0.00   Total pack years: 28.00   Types: Cigarettes  Smokeless Tobacco Never    Social History   Substance and Sexual Activity  Alcohol Use Not Currently   Alcohol/week: 0.0 standard drinks of alcohol     Allergies  Allergen Reactions   Cephalexin Other (See Comments)  Secondary yeast infection   Metformin And Related Nausea And Vomiting    With all doses and regular and extended release forms    Current Facility-Administered Medications  Medication Dose Route Frequency Provider Last Rate Last Admin   0.9 %  sodium chloride infusion  250 mL Intravenous PRN Marykay Lex, MD       0.9% sodium chloride infusion  1 mL/kg/hr Intravenous Continuous Marykay Lex, MD 79.8 mL/hr at 10/19/22 0707 1 mL/kg/hr at 10/19/22 1610   aspirin chewable tablet 81 mg  81 mg Oral Once Marykay Lex, MD       fentaNYL (SUBLIMAZE) injection    PRN Marykay Lex, MD   25 mcg at 10/19/22 9604   Heparin (Porcine) in NaCl 1000-0.9 UT/500ML-% SOLN    PRN Marykay Lex, MD   500 mL at 10/19/22 1001   heparin sodium (porcine) injection    PRN Marykay Lex, MD    4,000 Units at 10/19/22 0935   iohexol (OMNIPAQUE) 350 MG/ML injection    PRN Marykay Lex, MD   60 mL at 10/19/22 1001   lidocaine (PF) (XYLOCAINE) 1 % injection    PRN Marykay Lex, MD   2 mL at 10/19/22 0929   midazolam (VERSED) injection    PRN Marykay Lex, MD   2 mg at 10/19/22 5409   sodium chloride flush (NS) 0.9 % injection 3 mL  3 mL Intravenous Q12H Marykay Lex, MD       sodium chloride flush (NS) 0.9 % injection 3 mL  3 mL Intravenous PRN Marykay Lex, MD        Medications Prior to Admission  Medication Sig Dispense Refill Last Dose   aspirin EC 81 MG tablet Take 1 tablet (81 mg total) by mouth daily. Swallow whole. (Patient taking differently: Take 81 mg by mouth at bedtime. Swallow whole.) 90 tablet 3 10/19/2022 at 0430   b complex vitamins capsule Take 1 capsule by mouth daily.   10/18/2022   carvedilol (COREG) 3.125 MG tablet Take 1 tablet (3.125 mg total) by mouth 2 (two) times daily. 180 tablet 3 10/19/2022 at 0430   cetirizine (ZYRTEC) 10 MG tablet Take 10 mg by mouth daily as needed for allergies.   10/18/2022   dapagliflozin propanediol (FARXIGA) 10 MG TABS tablet Take 1 tablet (10 mg total) by mouth daily. (Patient taking differently: Take 10 mg by mouth at bedtime.) 90 tablet 1 10/18/2022 at 0730   fluticasone (FLONASE) 50 MCG/ACT nasal spray Place 1 spray into both nostrils daily as needed for allergies.   Past Month   lisinopril (ZESTRIL) 10 MG tablet Take 1 tablet (10 mg total) by mouth daily. 90 tablet 3 10/18/2022 at 2000   nitroGLYCERIN (NITROSTAT) 0.3 MG SL tablet Place 1 tablet (0.3 mg total) under the tongue every 5 (five) minutes as needed for chest pain. 90 tablet 12 10/18/2022 at 2030   omeprazole (PRILOSEC OTC) 20 MG tablet Take 20 mg by mouth daily as needed (acid reflux).   10/18/2022 at 2030   rosuvastatin (CRESTOR) 40 MG tablet Take 1 tablet (40 mg total) by mouth daily. 90 tablet 1 10/18/2022 at 2030   Semaglutide (RYBELSUS) 3 MG TABS Take 1 tablet (3  mg total) by mouth daily. 30 tablet 1 10/18/2022 at 0800   sitaGLIPtin (JANUVIA) 100 MG tablet Take 1 tablet (100 mg total) by mouth daily. (Patient not taking: Reported on 10/17/2022) 90 tablet 1 Not Taking  valACYclovir (VALTREX) 1000 MG tablet Take 1 tablet (1,000 mg total) by mouth daily. Take as needed for herpes outbreak. (Patient taking differently: Take 1,000 mg by mouth daily as needed (herpes outbreak.).) 90 tablet 3 More than a month    Family History  Problem Relation Age of Onset   Hypertension Mother    Hyperlipidemia Mother    Diabetes Mother    Arrhythmia Father 82       A-fib   Alcohol abuse Father    Breast cancer Maternal Aunt    Heart disease Brother 50       stent x 1    Alcoholism Brother    Asthma Son    Asperger's syndrome Son      Review of Systems:          Cardiac Review of Systems: Y or  [    ]= no  Chest Pain [  Y  ]  Resting SOB [ N  ] Exertional SOB  [  ]  Orthopnea [  ]   Pedal Edema Klaus.Mock   ]    Palpitations Klaus.Mock  ] Syncope  [  ]   Presyncope [   ]  General Review of Systems: [Y] = yes [  ]=no Constitional: recent weight change [  ]; anorexia [  ]; fatigue [Y  ]; nausea [  ]; night sweats [  ]; fever [  ]; or chills [  ]                                                               Dental: Last Dentist visit:   Eye : blurred vision [  ]; diplopia [   ]; vision changes [ N ];  Amaurosis fugax[  ]; Resp: cough [ N ];  wheezing[  ];  hemoptysis[  ]; shortness of breath[  ]; paroxysmal nocturnal dyspnea[  ]; dyspnea on exertion[ N ]; or orthopnea[  ];  GI:  gallstones[  ], vomiting[N  ];  dysphagia[  ]; melena[  ];  hematochezia [  ]; heartburn[  ];   Hx of  Colonoscopy[  ]; GU: kidney stones [  ]; hematuria[  ];   dysuria [  ];  nocturia[  ];  history of     obstruction [  ]; urinary frequency [  ]             Skin: rash, swelling[  ];, hair loss[  ];  peripheral edema[ N ];  or itching[  ]; + varicose veins Musculosketetal: myalgias[  ];  joint swelling[   ];  joint erythema[  ];  joint pain[  ];  back pain[  ];  Heme/Lymph: bruising[  ];  bleeding[  ];  anemia[  ];  Neuro: TIA[  ];  headaches[  ];  stroke[ N ];  vertigo[  ];  seizures[  ];   paresthesias[  ];  difficulty walking[  ];  Psych:depression[  ]; anxiety[  ];  Endocrine: diabetes[Y, poorly controlled  ];  thyroid dysfunction[  ];                 Physical Exam: BP 135/76   Pulse 68   Temp 98.1 F (36.7 C) (Temporal)   Resp 16   Ht 5'  3" (1.6 m)   Wt 79.8 kg   SpO2 99%   BMI 31.18 kg/m   General appearance: alert, cooperative, no distress, and mildly obese Head: Normocephalic, without obvious abnormality, atraumatic Neck: no adenopathy, no carotid bruit, no JVD, supple, symmetrical, trachea midline, and thyroid not enlarged, symmetric, no tenderness/mass/nodules Resp: clear to auscultation bilaterally Cardio: regular rate and rhythm GI: soft, non-tender; bowel sounds normal; no masses,  no organomegaly Extremities: extremities normal, atraumatic, no cyanosis or edema and varicose veins noted Neurologic: Grossly normal  Diagnostic Studies & Laboratory data:     Recent Radiology Findings:   No results found.   I have independently reviewed the above radiologic studies and discussed with the patient   Recent Lab Findings: Lab Results  Component Value Date   WBC 10.6 10/15/2022   HGB 15.9 10/15/2022   HCT 47.8 (H) 10/15/2022   PLT 193 10/15/2022   GLUCOSE 173 (H) 10/15/2022   CHOL 140 08/31/2022   TRIG 215 (H) 08/31/2022   HDL 35 (L) 08/31/2022   LDLCALC 74 08/31/2022   ALT 37 (H) 08/31/2022   AST 40 (H) 08/31/2022   NA 137 10/15/2022   K 4.3 10/15/2022   CL 99 10/15/2022   CREATININE 0.58 10/15/2022   BUN 12 10/15/2022   CO2 19 (L) 10/15/2022   TSH 1.21 10/18/2016   HGBA1C 10.9 (H) 08/31/2022    Assessment / Plan:      CAD- patient with angina, presented for outpatient catheterization today with LM involvement... she is a good candidate, plan is for  CABG on Monday with Dr. Cliffton Asters Obesity Poorly controlled DM- A1c is at 10.9, hold Farxiga prior to surgery, will require diabetes education to optimize longevity of bypass grafts grafts HLD HTN Nicotine Abuse   Patient does not want to stay in the hospital prior to surgery.  She was told the patient this would ultimately be up to Cardiology.  However with her Left main disease, it would not be in her best interest to be discharged.  This was discussed with the patient and she still stated she wanted to go home.  I explained this would be AMA.  Will continue to complete preoperative workup for CABG Monday with Dr. Cliffton Asters  I  spent 40 minutes counseling the patient face to face.  Lowella Dandy, PA-C 10/19/2022 10:23 AM    Agree with above 46yo female admitted following an elective LHC with LM/3V CAD.  Echo shows preserved biventricular function, and no significant valvular disease.  She is agreeable to proceed with surgical revascularization.  She recently broke her left wrist so we will not be able to use her radial artery.  Maelin Kurkowski Keane Scrape

## 2022-10-20 DIAGNOSIS — I2510 Atherosclerotic heart disease of native coronary artery without angina pectoris: Secondary | ICD-10-CM

## 2022-10-20 LAB — SURGICAL PCR SCREEN
MRSA, PCR: NEGATIVE
Staphylococcus aureus: NEGATIVE

## 2022-10-20 LAB — HEPARIN LEVEL (UNFRACTIONATED)
Heparin Unfractionated: 0.12 IU/mL — ABNORMAL LOW (ref 0.30–0.70)
Heparin Unfractionated: 0.2 IU/mL — ABNORMAL LOW (ref 0.30–0.70)
Heparin Unfractionated: 0.28 IU/mL — ABNORMAL LOW (ref 0.30–0.70)

## 2022-10-20 LAB — CBC
HCT: 45.1 % (ref 36.0–46.0)
Hemoglobin: 15.5 g/dL — ABNORMAL HIGH (ref 12.0–15.0)
MCH: 31.5 pg (ref 26.0–34.0)
MCHC: 34.4 g/dL (ref 30.0–36.0)
MCV: 91.7 fL (ref 80.0–100.0)
Platelets: 143 10*3/uL — ABNORMAL LOW (ref 150–400)
RBC: 4.92 MIL/uL (ref 3.87–5.11)
RDW: 13.2 % (ref 11.5–15.5)
WBC: 6.7 10*3/uL (ref 4.0–10.5)
nRBC: 0 % (ref 0.0–0.2)

## 2022-10-20 LAB — GLUCOSE, CAPILLARY
Glucose-Capillary: 209 mg/dL — ABNORMAL HIGH (ref 70–99)
Glucose-Capillary: 114 mg/dL — ABNORMAL HIGH (ref 70–99)

## 2022-10-20 MED ORDER — HEPARIN BOLUS VIA INFUSION
1000.0000 [IU] | Freq: Once | INTRAVENOUS | Status: AC
  Administered 2022-10-20: 1000 [IU] via INTRAVENOUS
  Filled 2022-10-20: qty 1000

## 2022-10-20 MED ORDER — MUPIROCIN 2 % EX OINT
1.0000 | TOPICAL_OINTMENT | Freq: Two times a day (BID) | CUTANEOUS | Status: DC
  Administered 2022-10-20 (×2): 1 via NASAL
  Filled 2022-10-20 (×2): qty 22

## 2022-10-20 NOTE — Progress Notes (Signed)
ANTICOAGULATION CONSULT NOTE Pharmacy Consult for heparin Indication: chest pain/ACS Brief A/P: Heparin level subtherapeutic Increase Heparin rate  Allergies  Allergen Reactions   Cephalexin Other (See Comments)    Secondary yeast infection   Metformin And Related Nausea And Vomiting    With all doses and regular and extended release forms    Patient Measurements: Height: 5\' 3"  (160 cm) Weight: 79.8 kg (176 lb) IBW/kg (Calculated) : 52.4 Heparin Dosing Weight: 79kg  Vital Signs: Temp: 98.8 F (37.1 C) (05/04 0053) Temp Source: Oral (05/04 0053) BP: 118/75 (05/04 0053) Pulse Rate: 72 (05/04 0053)  Labs: Recent Labs    10/20/22 0043  HEPARINUNFRC 0.12*    Estimated Creatinine Clearance: 87 mL/min (by C-G formula based on SCr of 0.58 mg/dL).   Assessment: 47 y.o. female with CAD awaiting CABG for heparin  Goal of Therapy:  Heparin level 0.3-0.7 units/ml Monitor platelets by anticoagulation protocol: Yes   Plan:  Increase Heparin 1300 units/hr Check heparin level in 8 hours.  Geannie Risen, PharmD, BCPS

## 2022-10-20 NOTE — Progress Notes (Signed)
ANTICOAGULATION CONSULT NOTE Pharmacy Consult for heparin Indication: chest pain/ACS  Allergies  Allergen Reactions   Cephalexin Other (See Comments)    Secondary yeast infection   Metformin And Related Nausea And Vomiting    With all doses and regular and extended release forms    Patient Measurements: Height: 5\' 3"  (160 cm) Weight: 79.8 kg (176 lb) IBW/kg (Calculated) : 52.4 Heparin Dosing Weight: 69.8 kg  Vital Signs: Temp: 98.3 F (36.8 C) (05/04 1126) Temp Source: Oral (05/04 1126) BP: 107/81 (05/04 1126) Pulse Rate: 67 (05/04 1126)  Labs: Recent Labs    10/20/22 0043 10/20/22 1008 10/20/22 1643  HGB  --  15.5*  --   HCT  --  45.1  --   PLT  --  143*  --   HEPARINUNFRC 0.12* 0.20* 0.28*    Estimated Creatinine Clearance: 87 mL/min (by C-G formula based on SCr of 0.58 mg/dL).  Assessment: 47 y.o. female presenting with several weeks of progressively worsening chest pain and admitted for planned heart cath due to [facility]. LHC on 5/3 showed significant disease in mid LM and mid RCA. Pharmacy consulted to dose heparin while awaiting CABG 5/6.   PM update - heparin level up to 0.28 after rate increase this morning, remains slightly subtherapeutic. Hg 15.5, plt 143. No bleeding or issues with infusion per discussion with RN.  Goal of Therapy:  Heparin level 0.3-0.7 units/ml Monitor platelets by anticoagulation protocol: Yes   Plan:  Increase heparin infusion to 1600 units/hour Check 6-hour heparin level Monitor daily CBC, and signs/symptoms of bleeding   Leia Alf, PharmD, BCPS Please check AMION for all [facility] Pharmacy contact numbers Clinical Pharmacist 10/20/2022 7:51 PM

## 2022-10-20 NOTE — Progress Notes (Signed)
ANTICOAGULATION CONSULT NOTE Pharmacy Consult for heparin Indication: chest pain/ACS  Allergies  Allergen Reactions   Cephalexin Other (See Comments)    Secondary yeast infection   Metformin And Related Nausea And Vomiting    With all doses and regular and extended release forms    Patient Measurements: Height: 5\' 3"  (160 cm) Weight: 79.8 kg (176 lb) IBW/kg (Calculated) : 52.4 Heparin Dosing Weight: 69.8 kg  Vital Signs: Temp: 98.1 F (36.7 C) (05/04 0802) Temp Source: Oral (05/04 0802) BP: 133/80 (05/04 0802) Pulse Rate: 74 (05/04 0802)  Labs: Recent Labs    10/20/22 0043 10/20/22 1008  HGB  --  15.5*  HCT  --  45.1  PLT  --  143*  HEPARINUNFRC 0.12* 0.20*    Estimated Creatinine Clearance: 87 mL/min (by C-G formula based on SCr of 0.58 mg/dL).  Assessment: 46 y.o. female presenting with several weeks of progressively worsening chest pain and was admitted for planned heart cath due to [facility]. LHC on 5/3 showed significant disease in mid LM and mid RCA. Pharmacy has been consulted to dose heparin while awaiting CABG on Monday 5/6.   Heparin level subtherapeutic at 0.2 on 1300 units/hour. Hgb stable, plt 143. Per RN, no issues with infusion reported, no signs/symptoms of bleeding noted.  Goal of Therapy:  Heparin level 0.3-0.7 units/ml Monitor platelets by anticoagulation protocol: Yes   Plan:  Give heparin 1000 unit bolus x1 Increase heparin infusion to 1450 units/hour Check 6-hour heparin level Monitor daily heparin levels, CBC, and signs/symptoms of bleeding  Thank you for involving pharmacy in this patient's care.   Rockwell Alexandria, PharmD PGY1 Pharmacy Resident 10/20/2022 11:01 AM

## 2022-10-20 NOTE — Progress Notes (Signed)
[phone]  Patient has already walked with family member independently in the hallway. Review pre op open heart surgery with the patient. Discussed the importance of using incentive spirometer and "moving within the tube" patient was given open heart surgery booklet.Gladstone Lighter, RN,BSN 10/20/2022 9:48 AM

## 2022-10-20 NOTE — Progress Notes (Signed)
   Rounding Note    Patient Name: SYEDA HADY Date of Encounter: 10/20/2022  [facility] Cardiologist: Bryan Lemma, MD   Subjective   NAEO. Walked the halls this AM.  Vital Signs    Vitals:   10/19/22 2006 10/20/22 0053 10/20/22 0307 10/20/22 0802  BP: 127/64 118/75 135/81 133/80  Pulse: 74 72 73 74  Resp: 18 16 16 18   Temp: 98.8 F (37.1 C) 98.8 F (37.1 C) 98.2 F (36.8 C) 98.1 F (36.7 C)  TempSrc: Oral Oral Oral Oral  SpO2: 95%  95% 96%  Weight:      Height:        Intake/Output Summary (Last 24 hours) at 10/20/2022 0952 Last data filed at 10/20/2022 0405 Gross per 24 hour  Intake 471.41 ml  Output --  Net 471.41 ml      10/19/2022    5:41 AM 10/15/2022    1:56 PM 09/10/2022    3:24 PM  Last 3 Weights  Weight (lbs) 176 lb 176 lb 177 lb 11.2 oz  Weight (kg) 79.833 kg 79.833 kg 80.604 kg      Telemetry    Personally Reviewed  ECG    Personally Reviewed  Physical Exam   GEN: No acute distress.   Cardiac: RRR, no murmurs, rubs, or gallops.  Respiratory: Clear to auscultation bilaterally. Psych: Normal affect   Assessment & Plan    #CAD #Severe LM disease Planning for OHS Monday with Dr Cliffton Asters. NO ischemic symptoms today.  #DM Poorly controlled.  #HTN #HLD #Obesity      Sheria Lang T. Lalla Brothers, MD, [facility], [facility] Cardiac Electrophysiology

## 2022-10-21 ENCOUNTER — Inpatient Hospital Stay (HOSPITAL_COMMUNITY): Payer: 59

## 2022-10-21 ENCOUNTER — Encounter (HOSPITAL_COMMUNITY): Payer: Self-pay | Admitting: Cardiology

## 2022-10-21 DIAGNOSIS — I2510 Atherosclerotic heart disease of native coronary artery without angina pectoris: Secondary | ICD-10-CM | POA: Diagnosis not present

## 2022-10-21 LAB — URINALYSIS, ROUTINE W REFLEX MICROSCOPIC
Bacteria, UA: NONE SEEN
Bilirubin Urine: NEGATIVE
Glucose, UA: >=500 mg/dL — AB
Hgb urine dipstick: NEGATIVE
Ketones, ur: NEGATIVE mg/dL
Leukocytes,Ua: NEGATIVE
Nitrite: NEGATIVE
Protein, ur: NEGATIVE mg/dL
Specific Gravity, Urine: 1.024 (ref 1.005–1.030)
pH: 5 (ref 5.0–8.0)

## 2022-10-21 LAB — CBC
HCT: 43.3 % (ref 36.0–46.0)
Hemoglobin: 14.7 g/dL (ref 12.0–15.0)
MCH: 31.3 pg (ref 26.0–34.0)
MCHC: 33.9 g/dL (ref 30.0–36.0)
MCV: 92.3 fL (ref 80.0–100.0)
Platelets: 131 10*3/uL — ABNORMAL LOW (ref 150–400)
RBC: 4.69 MIL/uL (ref 3.87–5.11)
RDW: 13.2 % (ref 11.5–15.5)
WBC: 7.7 10*3/uL (ref 4.0–10.5)
nRBC: 0 % (ref 0.0–0.2)

## 2022-10-21 LAB — BLOOD GAS, ARTERIAL
Acid-Base Excess: 1.1 mmol/L (ref 0.0–2.0)
Bicarbonate: 25.1 mmol/L (ref 20.0–28.0)
Drawn by: 39898
O2 Saturation: 99.5 %
Patient temperature: 36.9
pCO2 arterial: 37 mmHg (ref 32–48)
pH, Arterial: 7.44 (ref 7.35–7.45)
pO2, Arterial: 90 mmHg (ref 83–108)

## 2022-10-21 LAB — BPAM RBC: Unit Type and Rh: 6200

## 2022-10-21 LAB — GLUCOSE, CAPILLARY
Glucose-Capillary: 121 mg/dL — ABNORMAL HIGH (ref 70–99)
Glucose-Capillary: 113 mg/dL — ABNORMAL HIGH (ref 70–99)
Glucose-Capillary: 138 mg/dL — ABNORMAL HIGH (ref 70–99)

## 2022-10-21 LAB — PREPARE RBC (CROSSMATCH)

## 2022-10-21 LAB — TYPE AND SCREEN: Antibody Screen: NEGATIVE

## 2022-10-21 LAB — PROTIME-INR
INR: 1.1 (ref 0.8–1.2)
Prothrombin Time: 14.1 seconds (ref 11.4–15.2)

## 2022-10-21 LAB — HEPARIN LEVEL (UNFRACTIONATED): Heparin Unfractionated: 0.47 IU/mL (ref 0.30–0.70)

## 2022-10-21 LAB — HEMOGLOBIN A1C
Hgb A1c MFr Bld: 8.5 % — ABNORMAL HIGH (ref 4.8–5.6)
Mean Plasma Glucose: 197.25 mg/dL

## 2022-10-21 LAB — APTT: aPTT: 95 seconds — ABNORMAL HIGH (ref 24–36)

## 2022-10-21 LAB — VAS US DOPPLER PRE CABG

## 2022-10-21 MED ORDER — BISACODYL 5 MG PO TBEC: 5.0000 mg | DELAYED_RELEASE_TABLET | Freq: Once | ORAL | Status: DC

## 2022-10-21 MED ORDER — CHLORHEXIDINE GLUCONATE 0.12 % MT SOLN
15.0000 mL | Freq: Once | OROMUCOSAL | Status: AC
  Administered 2022-10-22: 15 mL via OROMUCOSAL
  Filled 2022-10-21: qty 15

## 2022-10-21 MED ORDER — TEMAZEPAM 15 MG PO CAPS
15.0000 mg | ORAL_CAPSULE | Freq: Once | ORAL | Status: AC | PRN
  Administered 2022-10-21: 15 mg via ORAL
  Filled 2022-10-21: qty 1

## 2022-10-21 MED ORDER — METOPROLOL TARTRATE 12.5 MG HALF TABLET
12.5000 mg | ORAL_TABLET | Freq: Once | ORAL | Status: AC
  Administered 2022-10-22: 12.5 mg via ORAL
  Filled 2022-10-21: qty 1

## 2022-10-21 MED ORDER — CHLORHEXIDINE GLUCONATE CLOTH 2 % EX PADS
6.0000 | MEDICATED_PAD | Freq: Once | CUTANEOUS | Status: AC
  Administered 2022-10-22: 6 via TOPICAL

## 2022-10-21 MED ORDER — CHLORHEXIDINE GLUCONATE CLOTH 2 % EX PADS
6.0000 | MEDICATED_PAD | Freq: Once | CUTANEOUS | Status: AC
  Administered 2022-10-21: 6 via TOPICAL

## 2022-10-21 NOTE — Progress Notes (Signed)
ANTICOAGULATION CONSULT NOTE Pharmacy Consult for heparin Indication: chest pain/ACS  Allergies  Allergen Reactions   Cephalexin Other (See Comments)    Secondary yeast infection   Metformin And Related Nausea And Vomiting    With all doses and regular and extended release forms    Patient Measurements: Height: 5\' 3"  (160 cm) Weight: 79.8 kg (176 lb) IBW/kg (Calculated) : 52.4 Heparin Dosing Weight: 69.8 kg  Vital Signs: Temp: 98.2 F (36.8 C) (05/05 0339) Temp Source: Oral (05/05 0339) BP: 118/74 (05/05 0339) Pulse Rate: 73 (05/05 0339)  Labs: Recent Labs    10/20/22 1008 10/20/22 1643 10/21/22 0338  HGB 15.5*  --  14.7  HCT 45.1  --  43.3  PLT 143*  --  131*  HEPARINUNFRC 0.20* 0.28* 0.47    Estimated Creatinine Clearance: 87 mL/min (by C-G formula based on SCr of 0.58 mg/dL).  Assessment: 47 y.o. female presenting with several weeks of progressively worsening chest pain and admitted for planned heart cath due to [facility]. LHC on 5/3 showed significant disease in mid LM and mid RCA. Pharmacy consulted to dose heparin while awaiting CABG 5/6.   Heparin level therapeutic this morning at 0.47 on 1600 units/hour. Hgb 14.7, plt 131. Per RN, no issues with infusion reported, no signs/symptoms of bleeding noted.   Goal of Therapy:  Heparin level 0.3-0.7 units/ml Monitor platelets by anticoagulation protocol: Yes   Plan:  Continue heparin infusion at 1600 units/hour Monitor daily CBC, and signs/symptoms of bleeding  Thank you for involving pharmacy in this patient's care.   Rockwell Alexandria, PharmD PGY1 Pharmacy Resident 10/21/2022 7:59 AM

## 2022-10-21 NOTE — Progress Notes (Signed)
ABG obtained and sent to lab. °

## 2022-10-21 NOTE — Progress Notes (Signed)
   Rounding Note    Patient Name: MARIAMA EDNIE Date of Encounter: 10/21/2022  [facility] Cardiologist: Bryan Lemma, MD   Subjective   NAEO. No CP.  Vital Signs    Vitals:   10/20/22 1126 10/20/22 2025 10/21/22 0339 10/21/22 0924  BP: 107/81 106/71 118/74 114/64  Pulse: 67 76 73 81  Resp: 18  16 18   Temp: 98.3 F (36.8 C) 98.3 F (36.8 C) 98.2 F (36.8 C) 98.3 F (36.8 C)  TempSrc: Oral Oral Oral Oral  SpO2: 95% 97%    Weight:      Height:        Intake/Output Summary (Last 24 hours) at 10/21/2022 0936 Last data filed at 10/21/2022 0106 Gross per 24 hour  Intake 791.25 ml  Output --  Net 791.25 ml       10/19/2022    5:41 AM 10/15/2022    1:56 PM 09/10/2022    3:24 PM  Last 3 Weights  Weight (lbs) 176 lb 176 lb 177 lb 11.2 oz  Weight (kg) 79.833 kg 79.833 kg 80.604 kg      Telemetry    Personally Reviewed  ECG    Personally Reviewed  Physical Exam   GEN: No acute distress.   Cardiac: RRR, no murmurs, rubs, or gallops.  Respiratory: Clear to auscultation bilaterally. Psych: Normal affect   Assessment & Plan    #CAD #Severe LM disease Planning for OHS Monday with Dr Cliffton Asters. No ischemic symptoms today.  #DM #HTN #HLD #Obesity      Rossie Muskrat. Lalla Brothers, MD, [facility], [facility] Cardiac Electrophysiology

## 2022-10-21 NOTE — Progress Notes (Signed)
Pre-CABG vascular studies completed.   Please see CV Procedures for preliminary results.  Juelz Whittenberg, RVT  12:19 PM 10/21/22

## 2022-10-22 ENCOUNTER — Other Ambulatory Visit: Payer: Self-pay

## 2022-10-22 ENCOUNTER — Inpatient Hospital Stay (HOSPITAL_COMMUNITY): Payer: 59 | Admitting: General Practice

## 2022-10-22 ENCOUNTER — Inpatient Hospital Stay (HOSPITAL_COMMUNITY): Payer: 59

## 2022-10-22 ENCOUNTER — Encounter (HOSPITAL_COMMUNITY): Payer: Self-pay | Admitting: Cardiology

## 2022-10-22 ENCOUNTER — Inpatient Hospital Stay (HOSPITAL_COMMUNITY)
Admission: RE | Disposition: A | Discharge status: Home / Self Care | Payer: Self-pay | Source: Home / Self Care | Attending: Thoracic Surgery (Cardiothoracic Vascular Surgery)

## 2022-10-22 DIAGNOSIS — E1149 Type 2 diabetes mellitus with other diabetic neurological complication: Secondary | ICD-10-CM

## 2022-10-22 DIAGNOSIS — I2510 Atherosclerotic heart disease of native coronary artery without angina pectoris: Secondary | ICD-10-CM

## 2022-10-22 DIAGNOSIS — F17210 Nicotine dependence, cigarettes, uncomplicated: Secondary | ICD-10-CM

## 2022-10-22 DIAGNOSIS — Z951 Presence of aortocoronary bypass graft: Secondary | ICD-10-CM

## 2022-10-22 DIAGNOSIS — J449 Chronic obstructive pulmonary disease, unspecified: Secondary | ICD-10-CM

## 2022-10-22 DIAGNOSIS — I10 Essential (primary) hypertension: Secondary | ICD-10-CM

## 2022-10-22 HISTORY — PX: TEE WITHOUT CARDIOVERSION: SHX5443

## 2022-10-22 HISTORY — PX: CORONARY ARTERY BYPASS GRAFT: SHX141

## 2022-10-22 HISTORY — DX: Presence of aortocoronary bypass graft: Z95.1

## 2022-10-22 LAB — POCT I-STAT 7, (LYTES, BLD GAS, ICA,H+H)
Acid-base deficit: 3 mmol/L — ABNORMAL HIGH (ref 0.0–2.0)
Acid-base deficit: 1 mmol/L (ref 0.0–2.0)
Acid-base deficit: 4 mmol/L — ABNORMAL HIGH (ref 0.0–2.0)
Acid-base deficit: 3 mmol/L — ABNORMAL HIGH (ref 0.0–2.0)
Acid-base deficit: 10 mmol/L — ABNORMAL HIGH (ref 0.0–2.0)
Acid-base deficit: 5 mmol/L — ABNORMAL HIGH (ref 0.0–2.0)
Acid-base deficit: 8 mmol/L — ABNORMAL HIGH (ref 0.0–2.0)
Bicarbonate: 23.7 mmol/L (ref 20.0–28.0)
Bicarbonate: 23.4 mmol/L (ref 20.0–28.0)
Bicarbonate: 21.8 mmol/L (ref 20.0–28.0)
Bicarbonate: 22.9 mmol/L (ref 20.0–28.0)
Bicarbonate: 16.4 mmol/L — ABNORMAL LOW (ref 20.0–28.0)
Bicarbonate: 19.7 mmol/L — ABNORMAL LOW (ref 20.0–28.0)
Bicarbonate: 17.5 mmol/L — ABNORMAL LOW (ref 20.0–28.0)
Calcium, Ion: 1.24 mmol/L (ref 1.15–1.40)
Calcium, Ion: 0.95 mmol/L — ABNORMAL LOW (ref 1.15–1.40)
Calcium, Ion: 1.28 mmol/L (ref 1.15–1.40)
Calcium, Ion: 1.24 mmol/L (ref 1.15–1.40)
Calcium, Ion: 1.07 mmol/L — ABNORMAL LOW (ref 1.15–1.40)
Calcium, Ion: 1 mmol/L — ABNORMAL LOW (ref 1.15–1.40)
Calcium, Ion: 1.02 mmol/L — ABNORMAL LOW (ref 1.15–1.40)
HCT: 42 % (ref 36.0–46.0)
HCT: 30 % — ABNORMAL LOW (ref 36.0–46.0)
HCT: 30 % — ABNORMAL LOW (ref 36.0–46.0)
HCT: 40 % (ref 36.0–46.0)
HCT: 31 % — ABNORMAL LOW (ref 36.0–46.0)
HCT: 30 % — ABNORMAL LOW (ref 36.0–46.0)
HCT: 31 % — ABNORMAL LOW (ref 36.0–46.0)
Hemoglobin: 14.3 g/dL (ref 12.0–15.0)
Hemoglobin: 10.2 g/dL — ABNORMAL LOW (ref 12.0–15.0)
Hemoglobin: 10.2 g/dL — ABNORMAL LOW (ref 12.0–15.0)
Hemoglobin: 13.6 g/dL (ref 12.0–15.0)
Hemoglobin: 10.5 g/dL — ABNORMAL LOW (ref 12.0–15.0)
Hemoglobin: 10.2 g/dL — ABNORMAL LOW (ref 12.0–15.0)
Hemoglobin: 10.5 g/dL — ABNORMAL LOW (ref 12.0–15.0)
O2 Saturation: 100 %
O2 Saturation: 100 %
O2 Saturation: 100 %
O2 Saturation: 95 %
O2 Saturation: 98 %
O2 Saturation: 97 %
O2 Saturation: 95 %
Patient temperature: 36.8
Patient temperature: 36.9
Patient temperature: 37.3
Patient temperature: 37.7
Potassium: 3.6 mmol/L (ref 3.5–5.1)
Potassium: 4.6 mmol/L (ref 3.5–5.1)
Potassium: 4.6 mmol/L (ref 3.5–5.1)
Potassium: 4.3 mmol/L (ref 3.5–5.1)
Potassium: 3.5 mmol/L (ref 3.5–5.1)
Potassium: 3 mmol/L — ABNORMAL LOW (ref 3.5–5.1)
Potassium: 3 mmol/L — ABNORMAL LOW (ref 3.5–5.1)
Sodium: 137 mmol/L (ref 135–145)
Sodium: 138 mmol/L (ref 135–145)
Sodium: 137 mmol/L (ref 135–145)
Sodium: 141 mmol/L (ref 135–145)
Sodium: 144 mmol/L (ref 135–145)
Sodium: 146 mmol/L — ABNORMAL HIGH (ref 135–145)
Sodium: 145 mmol/L (ref 135–145)
TCO2: 25 mmol/L (ref 22–32)
TCO2: 25 mmol/L (ref 22–32)
TCO2: 23 mmol/L (ref 22–32)
TCO2: 24 mmol/L (ref 22–32)
TCO2: 17 mmol/L — ABNORMAL LOW (ref 22–32)
TCO2: 21 mmol/L — ABNORMAL LOW (ref 22–32)
TCO2: 18 mmol/L — ABNORMAL LOW (ref 22–32)
pCO2 arterial: 48.1 mmHg — ABNORMAL HIGH (ref 32–48)
pCO2 arterial: 38.2 mmHg (ref 32–48)
pCO2 arterial: 44.2 mmHg (ref 32–48)
pCO2 arterial: 43.4 mmHg (ref 32–48)
pCO2 arterial: 35.5 mmHg (ref 32–48)
pCO2 arterial: 36.7 mmHg (ref 32–48)
pCO2 arterial: 34 mmHg (ref 32–48)
pH, Arterial: 7.302 — ABNORMAL LOW (ref 7.35–7.45)
pH, Arterial: 7.396 (ref 7.35–7.45)
pH, Arterial: 7.301 — ABNORMAL LOW (ref 7.35–7.45)
pH, Arterial: 7.328 — ABNORMAL LOW (ref 7.35–7.45)
pH, Arterial: 7.271 — ABNORMAL LOW (ref 7.35–7.45)
pH, Arterial: 7.34 — ABNORMAL LOW (ref 7.35–7.45)
pH, Arterial: 7.322 — ABNORMAL LOW (ref 7.35–7.45)
pO2, Arterial: 328 mmHg — ABNORMAL HIGH (ref 83–108)
pO2, Arterial: 286 mmHg — ABNORMAL HIGH (ref 83–108)
pO2, Arterial: 264 mmHg — ABNORMAL HIGH (ref 83–108)
pO2, Arterial: 83 mmHg (ref 83–108)
pO2, Arterial: 117 mmHg — ABNORMAL HIGH (ref 83–108)
pO2, Arterial: 97 mmHg (ref 83–108)
pO2, Arterial: 84 mmHg (ref 83–108)

## 2022-10-22 LAB — POCT I-STAT EG7
Acid-base deficit: 1 mmol/L (ref 0.0–2.0)
Bicarbonate: 24.6 mmol/L (ref 20.0–28.0)
Calcium, Ion: 1.07 mmol/L — ABNORMAL LOW (ref 1.15–1.40)
HCT: 33 % — ABNORMAL LOW (ref 36.0–46.0)
Hemoglobin: 11.2 g/dL — ABNORMAL LOW (ref 12.0–15.0)
O2 Saturation: 76 %
Potassium: 3.8 mmol/L (ref 3.5–5.1)
Sodium: 139 mmol/L (ref 135–145)
TCO2: 26 mmol/L (ref 22–32)
pCO2, Ven: 42.4 mmHg — ABNORMAL LOW (ref 44–60)
pH, Ven: 7.372 (ref 7.25–7.43)
pO2, Ven: 42 mmHg (ref 32–45)

## 2022-10-22 LAB — POCT I-STAT, CHEM 8
BUN: 12 mg/dL (ref 6–20)
BUN: 11 mg/dL (ref 6–20)
BUN: 11 mg/dL (ref 6–20)
BUN: 10 mg/dL (ref 6–20)
Calcium, Ion: 1.23 mmol/L (ref 1.15–1.40)
Calcium, Ion: 1.22 mmol/L (ref 1.15–1.40)
Calcium, Ion: 1.05 mmol/L — ABNORMAL LOW (ref 1.15–1.40)
Calcium, Ion: 1.28 mmol/L (ref 1.15–1.40)
Chloride: 102 mmol/L (ref 98–111)
Chloride: 104 mmol/L (ref 98–111)
Chloride: 101 mmol/L (ref 98–111)
Chloride: 104 mmol/L (ref 98–111)
Creatinine, Ser: 0.3 mg/dL — ABNORMAL LOW (ref 0.44–1.00)
Creatinine, Ser: 0.4 mg/dL — ABNORMAL LOW (ref 0.44–1.00)
Creatinine, Ser: 0.3 mg/dL — ABNORMAL LOW (ref 0.44–1.00)
Creatinine, Ser: 0.3 mg/dL — ABNORMAL LOW (ref 0.44–1.00)
Glucose, Bld: 133 mg/dL — ABNORMAL HIGH (ref 70–99)
Glucose, Bld: 94 mg/dL (ref 70–99)
Glucose, Bld: 100 mg/dL — ABNORMAL HIGH (ref 70–99)
Glucose, Bld: 137 mg/dL — ABNORMAL HIGH (ref 70–99)
HCT: 43 % (ref 36.0–46.0)
HCT: 41 % (ref 36.0–46.0)
HCT: 30 % — ABNORMAL LOW (ref 36.0–46.0)
HCT: 30 % — ABNORMAL LOW (ref 36.0–46.0)
Hemoglobin: 14.6 g/dL (ref 12.0–15.0)
Hemoglobin: 13.9 g/dL (ref 12.0–15.0)
Hemoglobin: 10.2 g/dL — ABNORMAL LOW (ref 12.0–15.0)
Hemoglobin: 10.2 g/dL — ABNORMAL LOW (ref 12.0–15.0)
Potassium: 3.6 mmol/L (ref 3.5–5.1)
Potassium: 4.1 mmol/L (ref 3.5–5.1)
Potassium: 4.9 mmol/L (ref 3.5–5.1)
Potassium: 4.6 mmol/L (ref 3.5–5.1)
Sodium: 138 mmol/L (ref 135–145)
Sodium: 139 mmol/L (ref 135–145)
Sodium: 136 mmol/L (ref 135–145)
Sodium: 138 mmol/L (ref 135–145)
TCO2: 26 mmol/L (ref 22–32)
TCO2: 26 mmol/L (ref 22–32)
TCO2: 25 mmol/L (ref 22–32)
TCO2: 25 mmol/L (ref 22–32)

## 2022-10-22 LAB — BASIC METABOLIC PANEL
Anion gap: 13 (ref 5–15)
Anion gap: 8 (ref 5–15)
Anion gap: 7 (ref 5–15)
Anion gap: 9 (ref 5–15)
BUN: 14 mg/dL (ref 6–20)
BUN: 10 mg/dL (ref 6–20)
BUN: 7 mg/dL (ref 6–20)
BUN: 10 mg/dL (ref 6–20)
CO2: 19 mmol/L — ABNORMAL LOW (ref 22–32)
CO2: 23 mmol/L (ref 22–32)
CO2: 17 mmol/L — ABNORMAL LOW (ref 22–32)
CO2: 24 mmol/L (ref 22–32)
Calcium: 9 mg/dL (ref 8.9–10.3)
Calcium: 7.9 mg/dL — ABNORMAL LOW (ref 8.9–10.3)
Calcium: 6 mg/dL — CL (ref 8.9–10.3)
Calcium: 7.8 mg/dL — ABNORMAL LOW (ref 8.9–10.3)
Chloride: 102 mmol/L (ref 98–111)
Chloride: 108 mmol/L (ref 98–111)
Chloride: 117 mmol/L — ABNORMAL HIGH (ref 98–111)
Chloride: 106 mmol/L (ref 98–111)
Creatinine, Ser: 0.5 mg/dL (ref 0.44–1.00)
Creatinine, Ser: 0.53 mg/dL (ref 0.44–1.00)
Creatinine, Ser: 0.34 mg/dL — ABNORMAL LOW (ref 0.44–1.00)
Creatinine, Ser: 0.58 mg/dL (ref 0.44–1.00)
GFR, Estimated: >60 mL/min (ref >60)
GFR, Estimated: >60 mL/min (ref >60)
GFR, Estimated: >60 mL/min (ref >60)
GFR, Estimated: >60 mL/min (ref >60)
Glucose, Bld: 139 mg/dL — ABNORMAL HIGH (ref 70–99)
Glucose, Bld: 140 mg/dL — ABNORMAL HIGH (ref 70–99)
Glucose, Bld: 109 mg/dL — ABNORMAL HIGH (ref 70–99)
Glucose, Bld: 161 mg/dL — ABNORMAL HIGH (ref 70–99)
Potassium: 3.8 mmol/L (ref 3.5–5.1)
Potassium: 4 mmol/L (ref 3.5–5.1)
Potassium: 2.9 mmol/L — ABNORMAL LOW (ref 3.5–5.1)
Potassium: 3.5 mmol/L (ref 3.5–5.1)
Sodium: 134 mmol/L — ABNORMAL LOW (ref 135–145)
Sodium: 139 mmol/L (ref 135–145)
Sodium: 141 mmol/L (ref 135–145)
Sodium: 139 mmol/L (ref 135–145)

## 2022-10-22 LAB — GLUCOSE, CAPILLARY
Glucose-Capillary: 132 mg/dL — ABNORMAL HIGH (ref 70–99)
Glucose-Capillary: 136 mg/dL — ABNORMAL HIGH (ref 70–99)
Glucose-Capillary: 143 mg/dL — ABNORMAL HIGH (ref 70–99)
Glucose-Capillary: 165 mg/dL — ABNORMAL HIGH (ref 70–99)
Glucose-Capillary: 160 mg/dL — ABNORMAL HIGH (ref 70–99)
Glucose-Capillary: 129 mg/dL — ABNORMAL HIGH (ref 70–99)
Glucose-Capillary: 99 mg/dL (ref 70–99)
Glucose-Capillary: 112 mg/dL — ABNORMAL HIGH (ref 70–99)
Glucose-Capillary: 113 mg/dL — ABNORMAL HIGH (ref 70–99)
Glucose-Capillary: 109 mg/dL — ABNORMAL HIGH (ref 70–99)
Glucose-Capillary: 98 mg/dL (ref 70–99)
Glucose-Capillary: 119 mg/dL — ABNORMAL HIGH (ref 70–99)
Glucose-Capillary: 105 mg/dL — ABNORMAL HIGH (ref 70–99)
Glucose-Capillary: 110 mg/dL — ABNORMAL HIGH (ref 70–99)
Glucose-Capillary: 134 mg/dL — ABNORMAL HIGH (ref 70–99)
Glucose-Capillary: 104 mg/dL — ABNORMAL HIGH (ref 70–99)
Glucose-Capillary: 55 mg/dL — ABNORMAL LOW (ref 70–99)

## 2022-10-22 LAB — HEMOGLOBIN AND HEMATOCRIT, BLOOD
HCT: 30.7 % — ABNORMAL LOW (ref 36.0–46.0)
Hemoglobin: 10.5 g/dL — ABNORMAL LOW (ref 12.0–15.0)

## 2022-10-22 LAB — CBC
HCT: 45.8 % (ref 36.0–46.0)
HCT: 40 % (ref 36.0–46.0)
HCT: 37.5 % (ref 36.0–46.0)
Hemoglobin: 15.4 g/dL — ABNORMAL HIGH (ref 12.0–15.0)
Hemoglobin: 13.7 g/dL (ref 12.0–15.0)
Hemoglobin: 12.9 g/dL (ref 12.0–15.0)
MCH: 31.2 pg (ref 26.0–34.0)
MCH: 31.9 pg (ref 26.0–34.0)
MCH: 31.7 pg (ref 26.0–34.0)
MCHC: 33.6 g/dL (ref 30.0–36.0)
MCHC: 34.3 g/dL (ref 30.0–36.0)
MCHC: 34.4 g/dL (ref 30.0–36.0)
MCV: 92.9 fL (ref 80.0–100.0)
MCV: 93 fL (ref 80.0–100.0)
MCV: 92.1 fL (ref 80.0–100.0)
Platelets: 158 10*3/uL (ref 150–400)
Platelets: 141 10*3/uL — ABNORMAL LOW (ref 150–400)
Platelets: 120 10*3/uL — ABNORMAL LOW (ref 150–400)
RBC: 4.93 MIL/uL (ref 3.87–5.11)
RBC: 4.3 MIL/uL (ref 3.87–5.11)
RBC: 4.07 MIL/uL (ref 3.87–5.11)
RDW: 13.3 % (ref 11.5–15.5)
RDW: 13.2 % (ref 11.5–15.5)
RDW: 13.2 % (ref 11.5–15.5)
WBC: 7 10*3/uL (ref 4.0–10.5)
WBC: 16.5 10*3/uL — ABNORMAL HIGH (ref 4.0–10.5)
WBC: 13.5 10*3/uL — ABNORMAL HIGH (ref 4.0–10.5)
nRBC: 0 % (ref 0.0–0.2)
nRBC: 0 % (ref 0.0–0.2)
nRBC: 0 % (ref 0.0–0.2)

## 2022-10-22 LAB — PROTIME-INR
INR: 1.9 — ABNORMAL HIGH (ref 0.8–1.2)
Prothrombin Time: 21.6 seconds — ABNORMAL HIGH (ref 11.4–15.2)

## 2022-10-22 LAB — APTT: aPTT: 44 seconds — ABNORMAL HIGH (ref 24–36)

## 2022-10-22 LAB — SARS CORONAVIRUS 2 (TAT 6-24 HRS): SARS Coronavirus 2: NEGATIVE

## 2022-10-22 LAB — ECHO INTRAOPERATIVE TEE
Height: 63 in
Weight: 2816 oz

## 2022-10-22 LAB — MAGNESIUM: Magnesium: 2.7 mg/dL — ABNORMAL HIGH (ref 1.7–2.4)

## 2022-10-22 LAB — PLATELET COUNT: Platelets: 186 10*3/uL (ref 150–400)

## 2022-10-22 SURGERY — CORONARY ARTERY BYPASS GRAFTING (CABG)
Anesthesia: General | Site: Chest

## 2022-10-22 MED ORDER — CEFAZOLIN SODIUM-DEXTROSE 2-4 GM/100ML-% IV SOLN
2.0000 g | Freq: Three times a day (TID) | INTRAVENOUS | Status: AC
  Administered 2022-10-22 – 2022-10-24 (×6): 2 g via INTRAVENOUS
  Filled 2022-10-22 (×6): qty 100

## 2022-10-22 MED ORDER — POTASSIUM CHLORIDE 10 MEQ/50ML IV SOLN
10.0000 meq | Freq: Once | INTRAVENOUS | Status: AC
  Administered 2022-10-22: 10 meq via INTRAVENOUS
  Filled 2022-10-22: qty 50

## 2022-10-22 MED ORDER — MIDAZOLAM HCL 2 MG/2ML IJ SOLN: 2.0000 mg | INTRAMUSCULAR | Status: DC | PRN

## 2022-10-22 MED ORDER — METOPROLOL TARTRATE 5 MG/5ML IV SOLN: 2.5000 mg | INTRAVENOUS | Status: DC | PRN

## 2022-10-22 MED ORDER — LACTATED RINGERS IV SOLN: INTRAVENOUS | Status: DC | PRN

## 2022-10-22 MED ORDER — SODIUM CHLORIDE 0.9% FLUSH: 3.0000 mL | INTRAVENOUS | Status: DC | PRN

## 2022-10-22 MED ORDER — HEPARIN SODIUM (PORCINE) 1000 UNIT/ML IJ SOLN
INTRAMUSCULAR | Status: DC | PRN
  Administered 2022-10-22: 28000 [IU] via INTRAVENOUS

## 2022-10-22 MED ORDER — SODIUM CHLORIDE 0.45 % IV SOLN: INTRAVENOUS | Status: DC | PRN

## 2022-10-22 MED ORDER — SODIUM CHLORIDE 0.9% FLUSH
3.0000 mL | Freq: Two times a day (BID) | INTRAVENOUS | Status: DC
  Administered 2022-10-23 – 2022-10-24 (×4): 3 mL via INTRAVENOUS

## 2022-10-22 MED ORDER — HEPARIN SODIUM (PORCINE) 1000 UNIT/ML IJ SOLN
INTRAMUSCULAR | Status: AC
  Filled 2022-10-22: qty 1

## 2022-10-22 MED ORDER — CHLORHEXIDINE GLUCONATE 0.12 % MT SOLN
15.0000 mL | OROMUCOSAL | Status: AC
  Administered 2022-10-22: 15 mL via OROMUCOSAL
  Filled 2022-10-22: qty 15

## 2022-10-22 MED ORDER — SODIUM BICARBONATE 8.4 % IV SOLN
50.0000 meq | Freq: Once | INTRAVENOUS | Status: AC
  Administered 2022-10-22: 50 meq via INTRAVENOUS
  Filled 2022-10-22: qty 50

## 2022-10-22 MED ORDER — FENTANYL CITRATE (PF) 250 MCG/5ML IJ SOLN
INTRAMUSCULAR | Status: AC
  Filled 2022-10-22: qty 5

## 2022-10-22 MED ORDER — ACETAMINOPHEN 500 MG PO TABS
1000.0000 mg | ORAL_TABLET | Freq: Four times a day (QID) | ORAL | Status: DC
  Administered 2022-10-22 – 2022-10-25 (×13): 1000 mg via ORAL
  Filled 2022-10-22 (×13): qty 2

## 2022-10-22 MED ORDER — ROCURONIUM BROMIDE 10 MG/ML (PF) SYRINGE
PREFILLED_SYRINGE | INTRAVENOUS | Status: AC
  Filled 2022-10-22: qty 10

## 2022-10-22 MED ORDER — MORPHINE SULFATE (PF) 2 MG/ML IV SOLN
1.0000 mg | INTRAVENOUS | Status: DC | PRN
  Administered 2022-10-22 (×4): 2 mg via INTRAVENOUS
  Administered 2022-10-23 (×6): 4 mg via INTRAVENOUS
  Filled 2022-10-22 (×4): qty 2
  Filled 2022-10-22 (×2): qty 1
  Filled 2022-10-22 (×2): qty 2
  Filled 2022-10-22 (×2): qty 1

## 2022-10-22 MED ORDER — PROTAMINE SULFATE 10 MG/ML IV SOLN
INTRAVENOUS | Status: DC | PRN
  Administered 2022-10-22: 280 mg via INTRAVENOUS

## 2022-10-22 MED ORDER — PLASMA-LYTE A IV SOLN
INTRAVENOUS | Status: DC | PRN
  Administered 2022-10-22: 500 mL

## 2022-10-22 MED ORDER — ACETAMINOPHEN 160 MG/5ML PO SOLN: 1000.0000 mg | Freq: Four times a day (QID) | ORAL | Status: DC

## 2022-10-22 MED ORDER — ASPIRIN 81 MG PO CHEW: 324.0000 mg | CHEWABLE_TABLET | Freq: Every day | ORAL | Status: DC

## 2022-10-22 MED ORDER — PHENYLEPHRINE HCL-NACL 20-0.9 MG/250ML-% IV SOLN
0.0000 ug/min | INTRAVENOUS | Status: DC
  Administered 2022-10-22: 20 ug/min via INTRAVENOUS
  Filled 2022-10-22: qty 250

## 2022-10-22 MED ORDER — CHLORHEXIDINE GLUCONATE CLOTH 2 % EX PADS
6.0000 | MEDICATED_PAD | Freq: Every day | CUTANEOUS | Status: DC
  Administered 2022-10-23 – 2022-10-24 (×2): 6 via TOPICAL

## 2022-10-22 MED ORDER — PANTOPRAZOLE SODIUM 40 MG PO TBEC
40.0000 mg | DELAYED_RELEASE_TABLET | Freq: Every day | ORAL | Status: DC
  Administered 2022-10-24 – 2022-10-26 (×3): 40 mg via ORAL
  Filled 2022-10-22 (×3): qty 1

## 2022-10-22 MED ORDER — FENTANYL CITRATE (PF) 250 MCG/5ML IJ SOLN
INTRAMUSCULAR | Status: DC | PRN
  Administered 2022-10-22: 50 ug via INTRAVENOUS
  Administered 2022-10-22: 250 ug via INTRAVENOUS
  Administered 2022-10-22: 50 ug via INTRAVENOUS
  Administered 2022-10-22 (×2): 100 ug via INTRAVENOUS
  Administered 2022-10-22: 50 ug via INTRAVENOUS
  Administered 2022-10-22 (×2): 100 ug via INTRAVENOUS
  Administered 2022-10-22: 50 ug via INTRAVENOUS
  Administered 2022-10-22 (×2): 100 ug via INTRAVENOUS
  Administered 2022-10-22: 200 ug via INTRAVENOUS

## 2022-10-22 MED ORDER — ROSUVASTATIN CALCIUM 20 MG PO TABS
40.0000 mg | ORAL_TABLET | Freq: Every day | ORAL | Status: DC
  Administered 2022-10-23 – 2022-10-26 (×4): 40 mg via ORAL
  Filled 2022-10-22 (×4): qty 2

## 2022-10-22 MED ORDER — OXYCODONE HCL 5 MG PO TABS
5.0000 mg | ORAL_TABLET | ORAL | Status: DC | PRN
  Administered 2022-10-22: 5 mg via ORAL
  Administered 2022-10-23 – 2022-10-24 (×5): 10 mg via ORAL
  Administered 2022-10-25 (×2): 5 mg via ORAL
  Filled 2022-10-22: qty 1
  Filled 2022-10-22 (×4): qty 2
  Filled 2022-10-22 (×2): qty 1
  Filled 2022-10-22: qty 2

## 2022-10-22 MED ORDER — LACTATED RINGERS IV SOLN: 500.0000 mL | Freq: Once | INTRAVENOUS | Status: DC | PRN

## 2022-10-22 MED ORDER — ASPIRIN 325 MG PO TBEC
325.0000 mg | DELAYED_RELEASE_TABLET | Freq: Every day | ORAL | Status: DC
  Administered 2022-10-23 – 2022-10-26 (×4): 325 mg via ORAL
  Filled 2022-10-22 (×4): qty 1

## 2022-10-22 MED ORDER — MIDAZOLAM HCL (PF) 10 MG/2ML IJ SOLN
INTRAMUSCULAR | Status: AC
  Filled 2022-10-22: qty 2

## 2022-10-22 MED ORDER — INSULIN REGULAR(HUMAN) IN NACL 100-0.9 UT/100ML-% IV SOLN: INTRAVENOUS | Status: DC

## 2022-10-22 MED ORDER — PHENYLEPHRINE 80 MCG/ML (10ML) SYRINGE FOR IV PUSH (FOR BLOOD PRESSURE SUPPORT)
PREFILLED_SYRINGE | INTRAVENOUS | Status: DC | PRN
  Administered 2022-10-22: 60 ug via INTRAVENOUS
  Administered 2022-10-22 (×2): 80 ug via INTRAVENOUS
  Administered 2022-10-22: 40 ug via INTRAVENOUS
  Administered 2022-10-22 (×3): 80 ug via INTRAVENOUS
  Administered 2022-10-22: 160 ug via INTRAVENOUS
  Administered 2022-10-22: 20 ug via INTRAVENOUS
  Administered 2022-10-22 (×2): 80 ug via INTRAVENOUS
  Administered 2022-10-22: 40 ug via INTRAVENOUS
  Administered 2022-10-22: 80 ug via INTRAVENOUS

## 2022-10-22 MED ORDER — SODIUM CHLORIDE 0.9 % IV SOLN: 250.0000 mL | INTRAVENOUS | Status: DC

## 2022-10-22 MED ORDER — PROPOFOL 10 MG/ML IV BOLUS
INTRAVENOUS | Status: DC | PRN
  Administered 2022-10-22 (×2): 20 mg via INTRAVENOUS
  Administered 2022-10-22: 100 mg via INTRAVENOUS
  Administered 2022-10-22: 20 mg via INTRAVENOUS

## 2022-10-22 MED ORDER — LACTATED RINGERS IV SOLN: INTRAVENOUS | Status: DC

## 2022-10-22 MED ORDER — 0.9 % SODIUM CHLORIDE (POUR BTL) OPTIME
TOPICAL | Status: DC | PRN
  Administered 2022-10-22: 5000 mL

## 2022-10-22 MED ORDER — ALBUMIN HUMAN 5 % IV SOLN
250.0000 mL | INTRAVENOUS | Status: AC | PRN
  Administered 2022-10-22 (×3): 12.5 g via INTRAVENOUS
  Filled 2022-10-22: qty 250

## 2022-10-22 MED ORDER — DOCUSATE SODIUM 100 MG PO CAPS
200.0000 mg | ORAL_CAPSULE | Freq: Every day | ORAL | Status: DC
  Administered 2022-10-23 – 2022-10-26 (×4): 200 mg via ORAL
  Filled 2022-10-22 (×4): qty 2

## 2022-10-22 MED ORDER — DEXMEDETOMIDINE HCL IN NACL 400 MCG/100ML IV SOLN: 0.0000 ug/kg/h | INTRAVENOUS | Status: DC

## 2022-10-22 MED ORDER — HEMOSTATIC AGENTS (NO CHARGE) OPTIME
TOPICAL | Status: DC | PRN
  Administered 2022-10-22 (×2): 1 via TOPICAL

## 2022-10-22 MED ORDER — FAMOTIDINE IN NACL 20-0.9 MG/50ML-% IV SOLN
20.0000 mg | Freq: Two times a day (BID) | INTRAVENOUS | Status: AC
  Administered 2022-10-22: 20 mg via INTRAVENOUS
  Filled 2022-10-22: qty 50

## 2022-10-22 MED ORDER — VANCOMYCIN HCL IN DEXTROSE 1-5 GM/200ML-% IV SOLN
1000.0000 mg | Freq: Once | INTRAVENOUS | Status: AC
  Administered 2022-10-22: 1000 mg via INTRAVENOUS
  Filled 2022-10-22: qty 200

## 2022-10-22 MED ORDER — NITROGLYCERIN IN D5W 200-5 MCG/ML-% IV SOLN: 0.0000 ug/min | INTRAVENOUS | Status: DC

## 2022-10-22 MED ORDER — BISACODYL 10 MG RE SUPP: 10.0000 mg | Freq: Every day | RECTAL | Status: DC

## 2022-10-22 MED ORDER — PROPOFOL 10 MG/ML IV BOLUS
INTRAVENOUS | Status: AC
  Filled 2022-10-22: qty 20

## 2022-10-22 MED ORDER — ACETAMINOPHEN 650 MG RE SUPP
650.0000 mg | Freq: Once | RECTAL | Status: AC
  Administered 2022-10-22: 650 mg via RECTAL

## 2022-10-22 MED ORDER — PHENYLEPHRINE 80 MCG/ML (10ML) SYRINGE FOR IV PUSH (FOR BLOOD PRESSURE SUPPORT)
PREFILLED_SYRINGE | INTRAVENOUS | Status: AC
  Filled 2022-10-22: qty 10

## 2022-10-22 MED ORDER — LIDOCAINE 2% (20 MG/ML) 5 ML SYRINGE
INTRAMUSCULAR | Status: AC
  Filled 2022-10-22: qty 5

## 2022-10-22 MED ORDER — POTASSIUM CHLORIDE 10 MEQ/50ML IV SOLN: 10.0000 meq | INTRAVENOUS | Status: AC

## 2022-10-22 MED ORDER — TRAMADOL HCL 50 MG PO TABS
50.0000 mg | ORAL_TABLET | ORAL | Status: DC | PRN
  Administered 2022-10-23: 50 mg via ORAL
  Administered 2022-10-23 – 2022-10-26 (×9): 100 mg via ORAL
  Filled 2022-10-22 (×2): qty 2
  Filled 2022-10-22: qty 1
  Filled 2022-10-22 (×7): qty 2

## 2022-10-22 MED ORDER — BISACODYL 5 MG PO TBEC
10.0000 mg | DELAYED_RELEASE_TABLET | Freq: Every day | ORAL | Status: DC
  Administered 2022-10-23 – 2022-10-26 (×4): 10 mg via ORAL
  Filled 2022-10-22 (×4): qty 2

## 2022-10-22 MED ORDER — DEXTROSE 50 % IV SOLN: 0.0000 mL | INTRAVENOUS | Status: DC | PRN

## 2022-10-22 MED ORDER — SODIUM CHLORIDE 0.9 % IV SOLN: INTRAVENOUS | Status: DC

## 2022-10-22 MED ORDER — METOPROLOL TARTRATE 25 MG/10 ML ORAL SUSPENSION: 12.5000 mg | Freq: Two times a day (BID) | ORAL | Status: DC

## 2022-10-22 MED ORDER — ROCURONIUM BROMIDE 10 MG/ML (PF) SYRINGE
PREFILLED_SYRINGE | INTRAVENOUS | Status: DC | PRN
  Administered 2022-10-22: 50 mg via INTRAVENOUS
  Administered 2022-10-22: 80 mg via INTRAVENOUS
  Administered 2022-10-22: 20 mg via INTRAVENOUS
  Administered 2022-10-22 (×2): 30 mg via INTRAVENOUS
  Administered 2022-10-22: 50 mg via INTRAVENOUS

## 2022-10-22 MED ORDER — ACETAMINOPHEN 160 MG/5ML PO SOLN: 650.0000 mg | Freq: Once | ORAL | Status: AC

## 2022-10-22 MED ORDER — METOPROLOL TARTRATE 12.5 MG HALF TABLET: 12.5000 mg | ORAL_TABLET | Freq: Two times a day (BID) | ORAL | Status: DC

## 2022-10-22 MED ORDER — SODIUM BICARBONATE 8.4 % IV SOLN
100.0000 meq | Freq: Once | INTRAVENOUS | Status: AC
  Administered 2022-10-22: 100 meq via INTRAVENOUS

## 2022-10-22 MED ORDER — POTASSIUM CHLORIDE 10 MEQ/50ML IV SOLN
10.0000 meq | INTRAVENOUS | Status: AC
  Administered 2022-10-22 – 2022-10-23 (×3): 10 meq via INTRAVENOUS

## 2022-10-22 MED ORDER — MIDAZOLAM HCL (PF) 5 MG/ML IJ SOLN
INTRAMUSCULAR | Status: DC | PRN
  Administered 2022-10-22 (×2): 1 mg via INTRAVENOUS
  Administered 2022-10-22 (×4): 2 mg via INTRAVENOUS

## 2022-10-22 MED ORDER — SODIUM CHLORIDE 0.9 % IV SOLN: INTRAVENOUS | Status: DC | PRN

## 2022-10-22 MED ORDER — ONDANSETRON HCL 4 MG/2ML IJ SOLN
4.0000 mg | Freq: Four times a day (QID) | INTRAMUSCULAR | Status: DC | PRN
  Administered 2022-10-23 – 2022-10-24 (×3): 4 mg via INTRAVENOUS
  Filled 2022-10-22 (×3): qty 2

## 2022-10-22 MED ORDER — MAGNESIUM SULFATE 4 GM/100ML IV SOLN
4.0000 g | Freq: Once | INTRAVENOUS | Status: AC
  Administered 2022-10-22: 4 g via INTRAVENOUS
  Filled 2022-10-22: qty 100

## 2022-10-22 MED FILL — Verapamil HCl IV Soln 2.5 MG/ML: INTRAVENOUS | Qty: 2 | Status: AC

## 2022-10-22 SURGICAL SUPPLY — 104 items
ADH SKN CLS APL DERMABOND .7 (GAUZE/BANDAGES/DRESSINGS) ×2
ANTIFOG SOL W/FOAM PAD STRL (MISCELLANEOUS) ×2
BAG DECANTER FOR FLEXI CONT (MISCELLANEOUS) ×3 IMPLANT
BLADE CLIPPER SURG (BLADE) ×3 IMPLANT
BLADE STERNUM SYSTEM 6 (BLADE) ×3 IMPLANT
BLADE SURG 11 STRL SS (BLADE) IMPLANT
BNDG CMPR MED 10X6 ELC LF (GAUZE/BANDAGES/DRESSINGS) ×2
BNDG CMPR STD VLCR NS LF 5.8X4 (GAUZE/BANDAGES/DRESSINGS) ×2
BNDG ELASTIC 4X5.8 VLCR NS LF (GAUZE/BANDAGES/DRESSINGS) IMPLANT
BNDG ELASTIC 4X5.8 VLCR STR LF (GAUZE/BANDAGES/DRESSINGS) ×3 IMPLANT
BNDG ELASTIC 6X10 VLCR STRL LF (GAUZE/BANDAGES/DRESSINGS) IMPLANT
BNDG ELASTIC 6X5.8 VLCR STR LF (GAUZE/BANDAGES/DRESSINGS) ×3 IMPLANT
BNDG GAUZE DERMACEA FLUFF 4 (GAUZE/BANDAGES/DRESSINGS) ×3 IMPLANT
BNDG GZE DERMACEA 4 6PLY (GAUZE/BANDAGES/DRESSINGS) ×2
CABLE SURGICAL S-101-97-12 (CABLE) ×3 IMPLANT
CANISTER SUCT 3000ML PPV (MISCELLANEOUS) ×3 IMPLANT
CANNULA MC2 2 STG 29/37 NON-V (CANNULA) ×3 IMPLANT
CANNULA NON VENT 20FR 12 (CANNULA) ×3 IMPLANT
CATH ROBINSON RED A/P 18FR (CATHETERS) ×6 IMPLANT
CLIP RETRACTION 3.0MM CORONARY (MISCELLANEOUS) IMPLANT
CLIP TI MEDIUM 24 (CLIP) IMPLANT
CLIP TI WIDE RED SMALL 24 (CLIP) IMPLANT
CONN ST 1/2X1/2  BEN (MISCELLANEOUS) ×2
CONN ST 1/2X1/2 BEN (MISCELLANEOUS) ×3 IMPLANT
CONNECTOR BLAKE 2:1 CARIO BLK (MISCELLANEOUS) ×3 IMPLANT
CONTAINER PROTECT SURGISLUSH (MISCELLANEOUS) ×6 IMPLANT
DERMABOND ADVANCED .7 DNX12 (GAUZE/BANDAGES/DRESSINGS) IMPLANT
DRAIN CHANNEL 19F RND (DRAIN) ×9 IMPLANT
DRAIN CHANNEL 28F RND 3/8 FF (WOUND CARE) IMPLANT
DRAIN CONNECTOR BLAKE 1:1 (MISCELLANEOUS) ×3 IMPLANT
DRAPE CARDIOVASCULAR INCISE (DRAPES) ×2
DRAPE INCISE IOBAN 66X45 STRL (DRAPES) IMPLANT
DRAPE SRG 135X102X78XABS (DRAPES) ×3 IMPLANT
DRAPE WARM FLUID 44X44 (DRAPES) ×3 IMPLANT
DRSG AQUACEL AG ADV 3.5X10 (GAUZE/BANDAGES/DRESSINGS) ×3 IMPLANT
ELECT BLADE 4.0 EZ CLEAN MEGAD (MISCELLANEOUS) ×2
ELECT REM PT RETURN 9FT ADLT (ELECTROSURGICAL) ×4
ELECTRODE BLDE 4.0 EZ CLN MEGD (MISCELLANEOUS) ×3 IMPLANT
ELECTRODE REM PT RTRN 9FT ADLT (ELECTROSURGICAL) ×6 IMPLANT
FELT TEFLON 1X6 (MISCELLANEOUS) ×6 IMPLANT
GAUZE 4X4 16PLY ~~LOC~~+RFID DBL (SPONGE) ×3 IMPLANT
GAUZE SPONGE 4X4 12PLY STRL (GAUZE/BANDAGES/DRESSINGS) ×6 IMPLANT
GLOVE BIO SURGEON STRL SZ 6 (GLOVE) IMPLANT
GLOVE BIO SURGEON STRL SZ 6.5 (GLOVE) IMPLANT
GLOVE BIO SURGEON STRL SZ7 (GLOVE) ×6 IMPLANT
GLOVE BIO SURGEON STRL SZ7.5 (GLOVE) IMPLANT
GLOVE BIOGEL M STRL SZ7.5 (GLOVE) ×6 IMPLANT
GLOVE BIOGEL PI IND STRL 6 (GLOVE) IMPLANT
GLOVE BIOGEL PI IND STRL 6.5 (GLOVE) IMPLANT
GLOVE BIOGEL PI IND STRL 7.0 (GLOVE) IMPLANT
GLOVE BIOGEL PI IND STRL 7.5 (GLOVE) IMPLANT
GLOVE SS BIOGEL STRL SZ 6 (GLOVE) IMPLANT
GLOVE SURG SS PI 8.0 STRL IVOR (GLOVE) IMPLANT
GOWN STRL REUS W/ TWL LRG LVL3 (GOWN DISPOSABLE) ×12 IMPLANT
GOWN STRL REUS W/ TWL XL LVL3 (GOWN DISPOSABLE) ×6 IMPLANT
GOWN STRL REUS W/TWL LRG LVL3 (GOWN DISPOSABLE) ×14
GOWN STRL REUS W/TWL XL LVL3 (GOWN DISPOSABLE) ×12
HEMOSTAT POWDER SURGIFOAM 1G (HEMOSTASIS) ×6 IMPLANT
INSERT SUTURE HOLDER (MISCELLANEOUS) ×3 IMPLANT
KIT BASIN OR (CUSTOM PROCEDURE TRAY) ×3 IMPLANT
KIT SUCTION CATH 14FR (SUCTIONS) ×3 IMPLANT
KIT TURNOVER KIT B (KITS) ×3 IMPLANT
KIT VASOVIEW HEMOPRO 2 VH 4000 (KITS) ×3 IMPLANT
LEAD PACING MYOCARDI (MISCELLANEOUS) ×3 IMPLANT
MARKER GRAFT CORONARY BYPASS (MISCELLANEOUS) ×9 IMPLANT
NS IRRIG 1000ML POUR BTL (IV SOLUTION) ×15 IMPLANT
PACK E OPEN HEART (SUTURE) ×3 IMPLANT
PACK OPEN HEART (CUSTOM PROCEDURE TRAY) ×3 IMPLANT
PAD ARMBOARD 7.5X6 YLW CONV (MISCELLANEOUS) ×6 IMPLANT
PAD ELECT DEFIB RADIOL ZOLL (MISCELLANEOUS) ×3 IMPLANT
PENCIL BUTTON HOLSTER BLD 10FT (ELECTRODE) ×3 IMPLANT
POSITIONER HEAD DONUT 9IN (MISCELLANEOUS) ×3 IMPLANT
PUNCH AORTIC ROTATE 4.0MM (MISCELLANEOUS) ×3 IMPLANT
SET MPS 3-ND DEL (MISCELLANEOUS) IMPLANT
SHEATH PROBE COVER 6X72 (BAG) IMPLANT
SOL PREP POV-IOD 4OZ 10% (MISCELLANEOUS) IMPLANT
SOL SCRUB PVP POV-IOD 4OZ 7.5% (MISCELLANEOUS) ×4
SOLUTION ANTFG W/FOAM PAD STRL (MISCELLANEOUS) IMPLANT
SOLUTION SCRB POV-IOD 4OZ 7.5% (MISCELLANEOUS) IMPLANT
SPONGE T-LAP 18X18 ~~LOC~~+RFID (SPONGE) ×12 IMPLANT
SUPPORT HEART JANKE-BARRON (MISCELLANEOUS) ×3 IMPLANT
SUT BONE WAX W31G (SUTURE) ×3 IMPLANT
SUT ETHIBOND X763 2 0 SH 1 (SUTURE) ×6 IMPLANT
SUT MNCRL AB 3-0 PS2 18 (SUTURE) ×6 IMPLANT
SUT MNCRL AB 4-0 PS2 18 (SUTURE) IMPLANT
SUT PDS AB 1 CTX 36 (SUTURE) ×6 IMPLANT
SUT PROLENE 4 0 RB 1 (SUTURE) ×8
SUT PROLENE 4 0 SH DA (SUTURE) ×3 IMPLANT
SUT PROLENE 4-0 RB1 .5 CRCL 36 (SUTURE) IMPLANT
SUT PROLENE 5 0 C 1 36 (SUTURE) ×9 IMPLANT
SUT PROLENE 7 0 BV1 MDA (SUTURE) ×3 IMPLANT
SUT STEEL 6MS V (SUTURE) ×6 IMPLANT
SUT VIC AB 2-0 CT1 27 (SUTURE) ×2
SUT VIC AB 2-0 CT1 TAPERPNT 27 (SUTURE) IMPLANT
SYSTEM SAHARA CHEST DRAIN ATS (WOUND CARE) ×3 IMPLANT
TAPE CLOTH SURG 4X10 WHT LF (GAUZE/BANDAGES/DRESSINGS) IMPLANT
TAPE PAPER 2X10 WHT MICROPORE (GAUZE/BANDAGES/DRESSINGS) IMPLANT
TOWEL GREEN STERILE (TOWEL DISPOSABLE) ×3 IMPLANT
TOWEL GREEN STERILE FF (TOWEL DISPOSABLE) ×3 IMPLANT
TRAY FOLEY SLVR 14FR TEMP STAT (SET/KITS/TRAYS/PACK) IMPLANT
TRAY FOLEY SLVR 16FR TEMP STAT (SET/KITS/TRAYS/PACK) ×3 IMPLANT
TUBING LAP HI FLOW INSUFFLATIO (TUBING) ×3 IMPLANT
UNDERPAD 30X36 HEAVY ABSORB (UNDERPADS AND DIAPERS) ×3 IMPLANT
WATER STERILE IRR 1000ML POUR (IV SOLUTION) ×6 IMPLANT

## 2022-10-22 NOTE — Procedures (Signed)
Extubation Procedure Note  Patient Details:   Name: Lauren [name] DOB: 1975/10/17 MRN: [id number]   Airway Documentation:    Vent end date: 10/22/22 Vent end time: 1815   Evaluation  O2 sats: stable throughout Complications: No apparent complications Patient did tolerate procedure well. Bilateral Breath Sounds: Diminished, Rhonchi   Yes, pt able to cough to clear secretions and vocalize name. Pt on 3L humidified nasal cannula and tolerating well at this time. Pt positive for cuff leak prior to extubation.   Tacy Learn 10/22/2022, 6:25 PM

## 2022-10-22 NOTE — Progress Notes (Signed)
     [address]       [place] [address]             [phone]       No events Vitals:   10/21/22 2209 10/22/22 0557  BP: 112/68 126/71  Pulse: 84 74  Resp: 18 16  Temp: 98.8 F (37.1 C) 97.9 F (36.6 C)  SpO2: 97% 97%   Arousable  Sinus EWOB  OR today for CABG  Shaena Parkerson O Mathius Birkeland

## 2022-10-22 NOTE — Consult Note (Signed)
NAME:  Lauren [name], MRN:  [id number], DOB:  03/08/1976, LOS: 3 ADMISSION DATE:  10/19/2022, CONSULTATION DATE:  5/6 REFERRING MD:  Cliffton Asters , CHIEF COMPLAINT:  post-op critical care    History of Present Illness:  This is a 47 year old female w/ hx as per below. Initially presented to PCP w/ cc exertional CP that had progressed to radiation down both arms. Left heart cath showed EF 50-55% distal LM disease at 90% w. 3V CAD. Went to OR 5/6 and underwent CABG X 3 V (LIMA to LAD, SVG to PDA and SVG to OM. Bypass time 10:40 to 12:07.  Returned to ICU post op critical care consulted post op   Pertinent  Medical History  HTN, HLD, type II DM CAD  Significant Hospital Events: Including procedures, antibiotic start and stop dates in addition to other pertinent events   3V CABG 5/6   Interim History / Subjective:  Sedated  Objective   Blood pressure 98/63, pulse 79, temperature 98.1 F (36.7 C), temperature source Bladder, resp. rate 16, height 5\' 3"  (1.6 m), weight 79.8 kg, SpO2 98 %. CVP:  [5 mmHg-8 mmHg] 8 mmHg CO:  [5.4 L/min] 5.4 L/min CI:  [3 L/min/m2] 3 L/min/m2  Vent Mode: SIMV;PSV;PRVC FiO2 (%):  [50 %] 50 % Set Rate:  [16 bmp] 16 bmp Vt Set:  [410 mL] 410 mL PEEP:  [5 cmH20] 5 cmH20 Pressure Support:  [10 cmH20] 10 cmH20 Plateau Pressure:  [17 cmH20] 17 cmH20   Intake/Output Summary (Last 24 hours) at 10/22/2022 1502 Last data filed at 10/22/2022 1400 Gross per 24 hour  Intake 5231.2 ml  Output 2625 ml  Net 2606.2 ml   Filed Weights   10/19/22 0541  Weight: 79.8 kg    Examination: General: 47 year old female sedated on vent  HENT: NCAT orally intubated Right IJ CVL good position  Lungs: clear. Equal chest rise. Chest tubes w about 100 cc blood tinged output Cardiovascular: RRR  Abdomen: soft Extremities: warm pulses strong graft side wrapped in ace on LLE Neuro: sedated  GU: cl yellow   Resolved Hospital Problem list     Assessment & Plan:   3 vessel CAD now  S/p 3V CABG 5/6 Plan Cont wean neo for MAP > 65 Keep euvolemic Mediastinal tubes per surg  Multimodal pain management  Complete surgical ancef prophylaxis  Keep euglycemic  Low dose metoprolol   Need for mechanical ventilator  Plan Rapid wean protocol  VAP bundle  PAD protocol RASS 0  IS post extubation   Type II DM  Plan Ssi   Mild leukocytosis  Suspect reactive Plan Monitor   At risk for AKI Plan Ensure adequate CO Ensure MAP > 65 Renal dose meds Strict I&O   Best Practice (right click and "Reselect all SmartList Selections" daily)   Diet/type: NPO DVT prophylaxis: SCD GI prophylaxis: PPI Lines: Central line Foley:  Yes, and it is still needed Code Status:  full code Last date of multidisciplinary goals of care discussion [per primary ]  Labs   CBC: Recent Labs  Lab 10/15/22 1526 10/20/22 1008 10/20/22 1008 10/21/22 0338 10/22/22 0552 10/22/22 0752 10/22/22 1130 10/22/22 1131 10/22/22 1227 10/22/22 1230 10/22/22 1323  WBC 10.6 6.7  --  7.7 7.0  --   --   --   --   --  16.5*  HGB 15.9 15.5*   < > 14.7 15.4*   < > 10.5* 10.2* 10.2* 10.2* 13.7  HCT 47.8*  45.1   < > 43.3 45.8   < > 30.7* 30.0* 30.0* 30.0* 40.0  MCV 92 91.7  --  92.3 92.9  --   --   --   --   --  93.0  PLT 193 143*  --  131* 158  --  186  --   --   --  141*   < > = values in this interval not displayed.    Basic Metabolic Panel: Recent Labs  Lab 10/15/22 1526 10/22/22 0552 10/22/22 0752 10/22/22 0755 10/22/22 1018 10/22/22 1048 10/22/22 1055 10/22/22 1131 10/22/22 1227 10/22/22 1230  NA 137 134*   < > 138 139 138 139 136 137 138  K 4.3 3.8   < > 3.6 4.1 4.6 3.8 4.9 4.6 4.6  CL 99 102  --  102 104  --   --  101  --  104  CO2 19* 19*  --   --   --   --   --   --   --   --   GLUCOSE 173* 139*  --  133* 94  --   --  100*  --  137*  BUN 12 14  --  12 11  --   --  11  --  10  CREATININE 0.58 0.50  --  0.30* 0.40*  --   --  0.30*  --  0.30*  CALCIUM 9.8 9.0  --   --   --    --   --   --   --   --    < > = values in this interval not displayed.   GFR: Estimated Creatinine Clearance: 87 mL/min (A) (by C-G formula based on SCr of 0.3 mg/dL (L)). Recent Labs  Lab 10/20/22 1008 10/21/22 0338 10/22/22 0552 10/22/22 1323  WBC 6.7 7.7 7.0 16.5*    Liver Function Tests: No results for input(s): "AST", "ALT", "ALKPHOS", "BILITOT", "PROT", "ALBUMIN" in the last 168 hours. No results for input(s): "LIPASE", "AMYLASE" in the last 168 hours. No results for input(s): "AMMONIA" in the last 168 hours.  ABG    Component Value Date/Time   PHART 7.301 (L) 10/22/2022 1227   PCO2ART 44.2 10/22/2022 1227   PO2ART 264 (H) 10/22/2022 1227   HCO3 21.8 10/22/2022 1227   TCO2 25 10/22/2022 1230   ACIDBASEDEF 4.0 (H) 10/22/2022 1227   O2SAT 100 10/22/2022 1227     Coagulation Profile: Recent Labs  Lab 10/21/22 0912 10/22/22 1323  INR 1.1 1.9*    Cardiac Enzymes: No results for input(s): "CKTOTAL", "CKMB", "CKMBINDEX", "TROPONINI" in the last 168 hours.  HbA1C: Hgb A1c MFr Bld  Date/Time Value Ref Range Status  10/21/2022 09:12 AM 8.5 (H) 4.8 - 5.6 % Final    Comment:    (NOTE) Pre diabetes:          5.7%-6.4%  Diabetes:              >6.4%  Glycemic control for   <7.0% adults with diabetes   08/31/2022 02:37 PM 10.9 (H) <5.7 % of total Hgb Final    Comment:    For someone without known diabetes, a hemoglobin A1c value of 6.5% or greater indicates that they may have  diabetes and this should be confirmed with a follow-up  test. . For someone with known diabetes, a value <7% indicates  that their diabetes is well controlled and a value  greater than or equal to 7% indicates suboptimal  control. A1c targets should be individualized based on  duration of diabetes, age, comorbid conditions, and  other considerations. . Currently, no consensus exists regarding use of hemoglobin A1c for diagnosis of diabetes for children. .     CBG: Recent Labs   Lab 10/21/22 2148 10/22/22 0522 10/22/22 1319 10/22/22 1405 10/22/22 1459  GLUCAP 138* 160* 129* 99 112*    Review of Systems:   Not able   Past Medical History:  She,  has a past medical history of Allergic rhinitis, Anxiety, Chronic depression, Diabetes mellitus without complication (HCC), Elevated LFTs, Genital herpes, GERD (gastroesophageal reflux disease), Hyperlipidemia, and Vitamin D deficiency.   Surgical History:   Past Surgical History:  Procedure Laterality Date   APPENDECTOMY     CESAREAN SECTION     x 4    COLONOSCOPY     COLONOSCOPY WITH PROPOFOL N/A 07/27/2020   Procedure: COLONOSCOPY WITH PROPOFOL;  Surgeon: Pasty Spillers, MD;  Location: [facility] ENDOSCOPY;  Service: Endoscopy;  Laterality: N/A;  COVID POSITIVE 07/05/2020   HERNIA REPAIR  09/25/2011   LEFT HEART CATH AND CORONARY ANGIOGRAPHY N/A 10/19/2022   Procedure: LEFT HEART CATH AND CORONARY ANGIOGRAPHY;  Surgeon: Marykay Lex, MD;  Location: [facility] INVASIVE CV LAB;  Service: Cardiovascular;  Laterality: N/A;   TUBAL LIGATION     TYMPANOMASTOIDECTOMY Right 12/07/2015   Procedure: TYMPANOMASTOIDECTOMY;  Surgeon: Geanie Logan, MD;  Location: [facility] ORS;  Service: ENT;  Laterality: Right;     Social History:   reports that she has been smoking cigarettes. She has a 28.00 pack-year smoking history. She has never used smokeless tobacco. She reports that she does not currently use alcohol. She reports that she does not use drugs.   Family History:  Her family history includes Alcohol abuse in her father; Alcoholism in her brother; Arrhythmia (age of onset: 35) in her father; Asperger's syndrome in her son; Asthma in her son; Breast cancer in her maternal aunt; Diabetes in her mother; Heart disease (age of onset: 49) in her brother; Hyperlipidemia in her mother; Hypertension in her mother.   Allergies Allergies  Allergen Reactions   Cephalexin Other (See Comments)    Secondary yeast infection   Metformin And  Related Nausea And Vomiting    With all doses and regular and extended release forms     Home Medications  Prior to Admission medications   Medication Sig Start Date End Date Taking? Authorizing Provider  aspirin EC 81 MG tablet Take 1 tablet (81 mg total) by mouth daily. Swallow whole. Patient taking differently: Take 81 mg by mouth at bedtime. Swallow whole. 10/15/22  Yes Marykay Lex, MD  b complex vitamins capsule Take 1 capsule by mouth daily.   Yes [provider]  carvedilol (COREG) 3.125 MG tablet Take 1 tablet (3.125 mg total) by mouth 2 (two) times daily. 10/15/22 01/13/23 Yes Marykay Lex, MD  cetirizine (ZYRTEC) 10 MG tablet Take 10 mg by mouth daily as needed for allergies.   Yes [provider]  dapagliflozin propanediol (FARXIGA) 10 MG TABS tablet Take 1 tablet (10 mg total) by mouth daily. Patient taking differently: Take 10 mg by mouth at bedtime. 09/10/22  Yes Margarita Mail, DO  fluticasone [facility]) 50 MCG/ACT nasal spray Place 1 spray into both nostrils daily as needed for allergies. 08/27/21  Yes [provider]  lisinopril (ZESTRIL) 10 MG tablet Take 1 tablet (10 mg total) by mouth daily. 10/15/22 01/13/23 Yes Marykay Lex, MD  nitroGLYCERIN (  NITROSTAT) 0.3 MG SL tablet Place 1 tablet (0.3 mg total) under the tongue every 5 (five) minutes as needed for chest pain. 09/10/22  Yes Margarita Mail, DO  omeprazole (PRILOSEC OTC) 20 MG tablet Take 20 mg by mouth daily as needed (acid reflux).   Yes [provider]  rosuvastatin (CRESTOR) 40 MG tablet Take 1 tablet (40 mg total) by mouth daily. 09/10/22  Yes Margarita Mail, DO  Semaglutide (RYBELSUS) 3 MG TABS Take 1 tablet (3 mg total) by mouth daily. 09/10/22  Yes Margarita Mail, DO  sitaGLIPtin (JANUVIA) 100 MG tablet Take 1 tablet (100 mg total) by mouth daily. Patient not taking: Reported on 10/17/2022 09/10/22   Margarita Mail, DO  valACYclovir (VALTREX) 1000 MG tablet  Take 1 tablet (1,000 mg total) by mouth daily. Take as needed for herpes outbreak. Patient taking differently: Take 1,000 mg by mouth daily as needed (herpes outbreak.). 08/18/21   Margarita Mail, DO  ranitidine (ZANTAC) 150 MG tablet Take 1 tablet (150 mg total) by mouth at bedtime. 09/12/18 12/03/18  Cheryle Horsfall, NP     Critical care time: 32 min      Simonne Martinet ACNP-BC [facility] Pulmonary/Critical Care Pager # [phone] OR # [phone] if no answer

## 2022-10-22 NOTE — Discharge Instructions (Signed)

## 2022-10-22 NOTE — Progress Notes (Signed)
      [address]       [place] [address]             [phone]      S/p CABG  BP 98/63   Pulse 76   Temp 99 F (37.2 C)   Resp 14   Ht 5\' 3"  (1.6 m)   Wt 79.8 kg   SpO2 98%   BMI 31.18 kg/m   CVP 9 Ci 2.8  ABG metabolic acidosis with BE - 10- give bicarb   Intake/Output Summary (Last 24 hours) at 10/22/2022 1734 Last data filed at 10/22/2022 1700 Gross per 24 hour  Intake 5851.19 ml  Output 3050 ml  Net 2801.19 ml   CT ~ 200 ml  Doing well early postop  Viviann Spare C. Dorris Fetch, MD [facility] [phone]

## 2022-10-22 NOTE — Progress Notes (Addendum)
Abg reviewed.  Still a little sleepy and acidotic  Plan Re-assess later this evening   Simonne Martinet ACNP-BC [facility] Pulmonary/Critical Care Pager # [phone] OR # [phone] if no answer

## 2022-10-22 NOTE — Anesthesia Procedure Notes (Signed)
Central Venous Catheter Insertion Performed by: Achille Rich, MD, anesthesiologist Start/End5/11/2022 6:55 AM, 10/22/2022 7:10 AM Patient location: Pre-op. Preanesthetic checklist: patient identified, IV checked, site marked, risks and benefits discussed, surgical consent, monitors and equipment checked, pre-op evaluation, timeout performed and anesthesia consent Lidocaine 1% used for infiltration and patient sedated Hand hygiene performed  and maximum sterile barriers used  Catheter size: 9 Fr MAC introducer Procedure performed using ultrasound guided technique. Ultrasound Notes:anatomy identified, needle tip was noted to be adjacent to the nerve/plexus identified, no ultrasound evidence of intravascular and/or intraneural injection and image(s) printed for medical record Attempts: 1 Following insertion, line sutured and dressing applied. Post procedure assessment: blood return through all ports, free fluid flow and no air  Patient tolerated the procedure well with no immediate complications.

## 2022-10-22 NOTE — Brief Op Note (Signed)
10/19/2022 - 10/22/2022  12:54 PM  PATIENT:  Lauren [name]  47 y.o. female  PRE-OPERATIVE DIAGNOSIS:  CORONARY ARTERY DISEASE / LEFT MAIN DISEASE  POST-OPERATIVE DIAGNOSIS:  CORONARY ARTERY DISEASE / LEFT MAIN DISEASE  PROCEDURE:  Procedure(s): CORONARY ARTERY BYPASS GRAFTING (CABG) x 3  ENDOSCOPIC RIGHT GREATER SAPHENOUS VEIN HARVEST TRANSESOPHAGEAL ECHOCARDIOGRAM  -LIMA TO LAD -SVG TO PDA -SVG TO OM Vein harvest time: Vein prep time:  SURGEON:  Surgeon(s) and Role: Lightfoot, Eliezer Lofts, MD - Primary  PHYSICIAN ASSISTANT: Aloha Gell PA-C  ASSISTANTS: Virgilio Frees RNFA   ANESTHESIA:   general  EBL:  1000 mL   BLOOD ADMINISTERED:none  DRAINS:  Mediastinal and pleural tubes    LOCAL MEDICATIONS USED:  NONE  SPECIMEN:  No Specimen  DISPOSITION OF SPECIMEN:  N/A  COUNTS CORRECT:  YES  DICTATION: .Dragon Dictation  PLAN OF CARE: Admit to inpatient   PATIENT DISPOSITION:  ICU - intubated and hemodynamically stable.   Delay start of Pharmacological VTE agent (>24hrs) due to surgical blood loss or risk of bleeding: yes

## 2022-10-22 NOTE — Anesthesia Procedure Notes (Signed)
Procedure Name: Intubation Date/Time: 10/22/2022 7:42 AM  Performed by: Maxine Glenn, CRNAPre-anesthesia Checklist: Patient identified, Emergency Drugs available, Suction available and Patient being monitored Patient Re-evaluated:Patient Re-evaluated prior to induction Oxygen Delivery Method: Circle System Utilized Preoxygenation: Pre-oxygenation with 100% oxygen Induction Type: IV induction Ventilation: Mask ventilation without difficulty Laryngoscope Size: Mac and 3 Grade View: Grade I Tube type: Oral Tube size: 8.0 mm Number of attempts: 1 Airway Equipment and Method: Stylet Placement Confirmation: ETT inserted through vocal cords under direct vision, positive ETCO2 and breath sounds checked- equal and bilateral Secured at: 22 cm Tube secured with: Tape Dental Injury: Teeth and Oropharynx as per pre-operative assessment

## 2022-10-22 NOTE — Transfer of Care (Signed)
Immediate Anesthesia Transfer of Care Note  Patient: Lauren [name]  Procedure(s) Performed: CORONARY ARTERY BYPASS GRAFTING (CABG) TIMES THREE USING THE LEFT INTERNAL MAMMARY ARTERY (LIMA) AND ENDOSCOPICALLY HARVESTED RIGHT GREATER SAPHENOUS VEIN (Chest) TRANSESOPHAGEAL ECHOCARDIOGRAM  Patient Location: ICU  Anesthesia Type:General  Level of Consciousness: sedated and Patient remains intubated per anesthesia plan  Airway & Oxygen Therapy: Patient remains intubated per anesthesia plan and Patient placed on Ventilator (see vital sign flow sheet for setting)  Post-op Assessment: Report given to RN and Post -op Vital signs reviewed and stable  Post vital signs: Reviewed and stable  Last Vitals:  Vitals Value Taken Time  BP    Temp    Pulse 79 10/22/22 1318  Resp 16 10/22/22 1318  SpO2 96 % 10/22/22 1318  Vitals shown include unvalidated device data.  Last Pain:  Vitals:   10/22/22 0557  TempSrc: Oral  PainSc:       Patients Stated Pain Goal: 0 (10/21/22 2030)  Complications: No notable events documented.

## 2022-10-22 NOTE — Anesthesia Preprocedure Evaluation (Signed)
Anesthesia Evaluation  Patient identified by MRN, date of birth, ID band Patient awake    Reviewed: Allergy & Precautions, H&P , NPO status , Patient's Chart, lab work & pertinent test results  Airway Mallampati: II   Neck ROM: full    Dental   Pulmonary COPD, Current Smoker   breath sounds clear to auscultation       Cardiovascular hypertension, + CAD   Rhythm:regular Rate:Normal     Neuro/Psych  PSYCHIATRIC DISORDERS Anxiety Depression     Neuromuscular disease    GI/Hepatic ,GERD  ,,  Endo/Other  diabetes, Type 2    Renal/GU      Musculoskeletal   Abdominal   Peds  Hematology   Anesthesia Other Findings   Reproductive/Obstetrics                             Anesthesia Physical Anesthesia Plan  ASA: 3  Anesthesia Plan: General   Post-op Pain Management:    Induction: Intravenous  PONV Risk Score and Plan: 2 and Ondansetron, Dexamethasone, Midazolam and Treatment may vary due to age or medical condition  Airway Management Planned: Oral ETT  Additional Equipment: Arterial line, CVP, Ultrasound Guidance Line Placement and TEE  Intra-op Plan:   Post-operative Plan: Post-operative intubation/ventilation  Informed Consent: I have reviewed the patients History and Physical, chart, labs and discussed the procedure including the risks, benefits and alternatives for the proposed anesthesia with the patient or authorized representative who has indicated his/her understanding and acceptance.     Dental advisory given  Plan Discussed with: CRNA, Anesthesiologist and Surgeon  Anesthesia Plan Comments:        Anesthesia Quick Evaluation

## 2022-10-22 NOTE — Hospital Course (Addendum)
History of Present Illness:       Lauren [name] is a 47 yo obese female with history of HTN, HLD, and Type 2 DM.  She presented to her Primary care physician with complaints of chest pain.  This had been occurring for months and she described the pain as mainly being exertional but had progressed to occurring at rest.  She describes it as pressure and it radiated down both arms.  Due to these symptoms she was referred to Dr. [name] who recommended the patient undergo catheterization.  This was performed today and showed multivessel CAD with LM involvement.  It was felt coronary bypass grafting would be indicated and she was admitted, started on IV Heparin and NTG.  Currently the patient is chest okay.  She does state that recently after she eats she notices she has chest discomfort.  This would not relieve with antacids.  She has been requiring NTG recently to help with chest pain.  She states she has not had to use more than 2 tablets.  Her symptoms were first noted about 2 months ago while hiking in [place] and she was worried she wouldn't get back down the mountain due to her discomfort.  She is a smoker of about 1 pack per day.  She used to be fairly active and would go to the gym 30 min per day, however she is no longer doing this.  She has a positive family history of CAD noting her dad and brother have both had early heart disease.  She does not wish to stay in the hospital prior to surgery.  She wants to go home.   Dr. Cliffton Asters reviewed the patient's diagnostic studies and determined surgical intervention would benefit this patient. He reviewed treatment options with the patient as well as the risks and benefits of surgery. Lauren [name] was agreeable to proceed with surgery.  Hospital Course: Lauren [name] was admitted to [facility] on 05/03. She remained in the hospital in stable condition until she was brought to the operating room on 10/22/22. She underwent CABG x 3 utilizing SVG to PDA, SVG to OM and  LIMA to LAD as well as endoscopic harvest of the right greater saphenous vein. She tolerated the procedure well and was transferred to the SICU in stable condition.

## 2022-10-22 NOTE — Op Note (Signed)
[address]       Lauren [name] [id number]             [id number]                                          10/22/2022 Patient:  Lauren [name] Pre-Op Dx: LM/3V CAD HTN HLP DM Obesity  Post-op Dx:  same Procedure: CABG X 3.  LIMA LAD, RSVG PDA, OM1   Endoscopic greater saphenous vein harvest on the right   Surgeon and Role:      * Chrisie Jankovich, Eliezer Lofts, MD - Primary    * B. Stehler , PA-C - assisting An experienced assistant was required given the complexity of this surgery and the standard of surgical care. The assistant was needed for exposure, dissection, suctioning, retraction of delicate tissues and sutures, instrument exchange and for overall help during this procedure.    Anesthesia  general EBL:  Blood Administration: none Xclamp Time:  48 min Pump Time:   Drains: 8 F blake drain:  R, L, mediastinal  Wires: ventricular Counts: correct   Indications: 47yo female admitted following an elective LHC with LM/3V CAD. Echo shows preserved biventricular function, and no significant valvular disease. She is agreeable to proceed with surgical revascularization. She recently broke her left wrist so we will not be able to use her radial artery.  Findings: Left pleural adhesions.  Good LIMA.  Varicosed vein.  Calcified LAD.  Good PDA, calcified OM  Operative Technique: All invasive lines were placed in pre-op holding.  After the risks, benefits and alternatives were thoroughly discussed, the patient was brought to the operative theatre.  Anesthesia was induced, and the patient was prepped and draped in normal sterile fashion.  An appropriate surgical pause was performed, and pre-operative antibiotics were dosed accordingly.  We began with simultaneous incisions along the right leg for harvesting of the greater saphenous vein and the chest for the sternotomy.  In regards to the sternotomy, this was carried down with bovie cautery, and the sternum was  divided with a reciprocating saw.  Meticulous hemostasis was obtained.  The left internal thoracic artery was exposed and harvested in in pedicled fashion.  The patient was systemically heparinized, and the artery was divided distally, and placed in a papaverine sponge.    The sternal elevator was removed, and a retractor was placed.  The pericardium was divided in the midline and fashioned into a cradle with pericardial stitches.   After we confirmed an appropriate ACT, the ascending aorta was cannulated in standard fashion.  The right atrial appendage was used for venous cannulation site.  Cardiopulmonary bypass was initiated, and the heart retractor was placed. The cross clamp was applied, and a dose of anterograde cardioplegia was given with good arrest of the heart.  We moved to the posterior wall of the heart, and found a good target on the PDA.  An arteriotomy was made, and the vein graft was anastomosed to it in an end to side fashion.  Next we exposed the lateral wall, and found a good target on the OM.  An end to side anastomosis with the vein graft was then created.  Finally, we exposed a good target on the  LAD, and fashioned an end to side anastomosis between it and the LITA.  We began to re-warm, and  a re-animation dose of cardioplegia was given.  The heart was de-aired, and the cross clamp was removed.  Meticulous hemostasis was obtained.    A partial occludding clamp was then placed on the ascending aorta, and we created an end to side anastomosis between it and the proximal vein grafts.  Rings were placed on the proximal anastomosis.  Hemostasis was obtained, and we separated from cardiopulmonary bypass without event.  The heparin was reversed with protamine.  Chest tubes and wires were placed, and the sternum was re-approximated with sternal wires.  The soft tissue and skin were re-approximated wth absorbable suture.    The patient tolerated the procedure without any immediate complications,  and was transferred to the ICU in guarded condition.  Lauren [name]

## 2022-10-23 ENCOUNTER — Encounter (HOSPITAL_COMMUNITY): Payer: Self-pay | Admitting: Thoracic Surgery (Cardiothoracic Vascular Surgery)

## 2022-10-23 ENCOUNTER — Inpatient Hospital Stay (HOSPITAL_COMMUNITY): Payer: 59

## 2022-10-23 LAB — MAGNESIUM
Magnesium: 2 mg/dL (ref 1.7–2.4)
Magnesium: 1.8 mg/dL (ref 1.7–2.4)

## 2022-10-23 LAB — POCT I-STAT 7, (LYTES, BLD GAS, ICA,H+H)
Acid-base deficit: 3 mmol/L — ABNORMAL HIGH (ref 0.0–2.0)
Bicarbonate: 21.8 mmol/L (ref 20.0–28.0)
Calcium, Ion: 1.19 mmol/L (ref 1.15–1.40)
HCT: 34 % — ABNORMAL LOW (ref 36.0–46.0)
Hemoglobin: 11.6 g/dL — ABNORMAL LOW (ref 12.0–15.0)
O2 Saturation: 96 %
Patient temperature: 37.4
Potassium: 4.1 mmol/L (ref 3.5–5.1)
Sodium: 137 mmol/L (ref 135–145)
TCO2: 23 mmol/L (ref 22–32)
pCO2 arterial: 36.4 mmHg (ref 32–48)
pH, Arterial: 7.387 (ref 7.35–7.45)
pO2, Arterial: 82 mmHg — ABNORMAL LOW (ref 83–108)

## 2022-10-23 LAB — BASIC METABOLIC PANEL
Anion gap: 7 (ref 5–15)
Anion gap: 8 (ref 5–15)
BUN: 6 mg/dL (ref 6–20)
BUN: 7 mg/dL (ref 6–20)
CO2: 22 mmol/L (ref 22–32)
CO2: 26 mmol/L (ref 22–32)
Calcium: 7.9 mg/dL — ABNORMAL LOW (ref 8.9–10.3)
Calcium: 8.2 mg/dL — ABNORMAL LOW (ref 8.9–10.3)
Chloride: 106 mmol/L (ref 98–111)
Chloride: 100 mmol/L (ref 98–111)
Creatinine, Ser: 0.51 mg/dL (ref 0.44–1.00)
Creatinine, Ser: 0.55 mg/dL (ref 0.44–1.00)
GFR, Estimated: >60 mL/min (ref >60)
GFR, Estimated: >60 mL/min (ref >60)
Glucose, Bld: 111 mg/dL — ABNORMAL HIGH (ref 70–99)
Glucose, Bld: 178 mg/dL — ABNORMAL HIGH (ref 70–99)
Potassium: 4 mmol/L (ref 3.5–5.1)
Potassium: 4 mmol/L (ref 3.5–5.1)
Sodium: 135 mmol/L (ref 135–145)
Sodium: 134 mmol/L — ABNORMAL LOW (ref 135–145)

## 2022-10-23 LAB — GLUCOSE, CAPILLARY
Glucose-Capillary: 103 mg/dL — ABNORMAL HIGH (ref 70–99)
Glucose-Capillary: 105 mg/dL — ABNORMAL HIGH (ref 70–99)
Glucose-Capillary: 109 mg/dL — ABNORMAL HIGH (ref 70–99)
Glucose-Capillary: 123 mg/dL — ABNORMAL HIGH (ref 70–99)
Glucose-Capillary: 142 mg/dL — ABNORMAL HIGH (ref 70–99)
Glucose-Capillary: 153 mg/dL — ABNORMAL HIGH (ref 70–99)
Glucose-Capillary: 162 mg/dL — ABNORMAL HIGH (ref 70–99)
Glucose-Capillary: 197 mg/dL — ABNORMAL HIGH (ref 70–99)
Glucose-Capillary: 72 mg/dL (ref 70–99)
Glucose-Capillary: 74 mg/dL (ref 70–99)
Glucose-Capillary: 82 mg/dL (ref 70–99)
Glucose-Capillary: 88 mg/dL (ref 70–99)
Glucose-Capillary: 91 mg/dL (ref 70–99)
Glucose-Capillary: 95 mg/dL (ref 70–99)
Glucose-Capillary: 99 mg/dL (ref 70–99)

## 2022-10-23 LAB — CBC
HCT: 34.3 % — ABNORMAL LOW (ref 36.0–46.0)
HCT: 34.2 % — ABNORMAL LOW (ref 36.0–46.0)
Hemoglobin: 11.7 g/dL — ABNORMAL LOW (ref 12.0–15.0)
Hemoglobin: 11.3 g/dL — ABNORMAL LOW (ref 12.0–15.0)
MCH: 31.6 pg (ref 26.0–34.0)
MCH: 31.4 pg (ref 26.0–34.0)
MCHC: 34.1 g/dL (ref 30.0–36.0)
MCHC: 33 g/dL (ref 30.0–36.0)
MCV: 92.7 fL (ref 80.0–100.0)
MCV: 95 fL (ref 80.0–100.0)
Platelets: 105 10*3/uL — ABNORMAL LOW (ref 150–400)
Platelets: 110 10*3/uL — ABNORMAL LOW (ref 150–400)
RBC: 3.7 MIL/uL — ABNORMAL LOW (ref 3.87–5.11)
RBC: 3.6 MIL/uL — ABNORMAL LOW (ref 3.87–5.11)
RDW: 13.5 % (ref 11.5–15.5)
RDW: 13.4 % (ref 11.5–15.5)
WBC: 9.2 10*3/uL (ref 4.0–10.5)
WBC: 10.3 10*3/uL (ref 4.0–10.5)
nRBC: 0 % (ref 0.0–0.2)
nRBC: 0 % (ref 0.0–0.2)

## 2022-10-23 LAB — ECHO INTRAOPERATIVE TEE

## 2022-10-23 MED ORDER — INSULIN DETEMIR 100 UNIT/ML ~~LOC~~ SOLN
5.0000 [IU] | Freq: Two times a day (BID) | SUBCUTANEOUS | Status: DC
  Administered 2022-10-23 – 2022-10-24 (×4): 5 [IU] via SUBCUTANEOUS
  Filled 2022-10-23 (×6): qty 0.05

## 2022-10-23 MED ORDER — ENOXAPARIN SODIUM 40 MG/0.4ML IJ SOSY
40.0000 mg | PREFILLED_SYRINGE | Freq: Every day | INTRAMUSCULAR | Status: DC
  Administered 2022-10-23 – 2022-10-24 (×2): 40 mg via SUBCUTANEOUS
  Filled 2022-10-23 (×2): qty 0.4

## 2022-10-23 MED ORDER — FUROSEMIDE 10 MG/ML IJ SOLN
40.0000 mg | Freq: Once | INTRAMUSCULAR | Status: AC
  Administered 2022-10-23: 40 mg via INTRAVENOUS
  Filled 2022-10-23: qty 4

## 2022-10-23 MED ORDER — METOPROLOL TARTRATE 25 MG PO TABS
25.0000 mg | ORAL_TABLET | Freq: Two times a day (BID) | ORAL | Status: DC
  Administered 2022-10-23 – 2022-10-24 (×4): 25 mg via ORAL
  Filled 2022-10-23 (×4): qty 1

## 2022-10-23 MED ORDER — INSULIN ASPART 100 UNIT/ML IJ SOLN: 0.0000 [IU] | INTRAMUSCULAR | Status: DC

## 2022-10-23 MED ORDER — INSULIN ASPART 100 UNIT/ML IJ SOLN
0.0000 [IU] | INTRAMUSCULAR | Status: DC
Start: 2022-10-23 — End: 2022-10-23

## 2022-10-23 MED ORDER — INSULIN ASPART 100 UNIT/ML IJ SOLN
1.0000 [IU] | INTRAMUSCULAR | Status: DC
  Administered 2022-10-23: 2 [IU] via SUBCUTANEOUS

## 2022-10-23 MED ORDER — INSULIN ASPART 100 UNIT/ML IJ SOLN
0.0000 [IU] | INTRAMUSCULAR | Status: DC
  Administered 2022-10-23: 3 [IU] via SUBCUTANEOUS
  Administered 2022-10-23 (×2): 4 [IU] via SUBCUTANEOUS
  Administered 2022-10-24 (×2): 3 [IU] via SUBCUTANEOUS
  Administered 2022-10-24 (×2): 4 [IU] via SUBCUTANEOUS
  Administered 2022-10-24 – 2022-10-25 (×2): 3 [IU] via SUBCUTANEOUS

## 2022-10-23 NOTE — TOC Initial Note (Signed)
Transition of Care [facility]) - Initial/Assessment Note    Patient Details  Name: Lauren [name] MRN: [id number] Date of Birth: September 03, 1975  Transition of Care [facility]) CM/SW Contact:    Gala Lewandowsky, RN Phone Number: 10/23/2022, 12:59 PM  Clinical Narrative:  Patient was discussed in progression rounds this morning. Patient presented for LHC- POD-1 CABG. PTA patient was independent from home with the support of her mother and other relatives. Patient was working prior to admission. Case Manager will continue to follow for  transition of care needs as the patient progresses.               Expected Discharge Plan: Home w Home Health Services Barriers to Discharge: Continued Medical Work up   Patient Goals and CMS Choice Patient states their goals for this hospitalization and ongoing recovery are:: to return home.   Choice offered to / list presented to : NA     Expected Discharge Plan and Services In-house Referral: NA Discharge Planning Services: CM Consult Post Acute Care Choice: Home Health Living arrangements for the past 2 months: Single Family Home                   DME Agency: NA   Prior Living Arrangements/Services Living arrangements for the past 2 months: Single Family Home Lives with:: Parents, Relatives Patient language and need for interpreter reviewed:: Yes Do you feel safe going back to the place where you live?: Yes      Need for Family Participation in Patient Care: Yes (Comment) Care giver support system in place?: Yes (comment)   Criminal Activity/Legal Involvement Pertinent to Current Situation/Hospitalization: No - Comment as needed  Activities of Daily Living Home Assistive Devices/Equipment: None ADL Screening (condition at time of admission) Patient's cognitive ability adequate to safely complete daily activities?: Yes Is the patient deaf or have difficulty hearing?: No Does the patient have difficulty seeing, even when wearing  glasses/contacts?: No Does the patient have difficulty concentrating, remembering, or making decisions?: No Patient able to express need for assistance with ADLs?: Yes Does the patient have difficulty dressing or bathing?: No Independently performs ADLs?: Yes (appropriate for developmental age) Does the patient have difficulty walking or climbing stairs?: No Weakness of Legs: None Weakness of Arms/Hands: None  Permission Sought/Granted Permission sought to share information with : Family Supports, Case Manager Permission granted to share information with : Yes, Verbal Permission Granted   Emotional Assessment Appearance:: Appears stated age Attitude/Demeanor/Rapport: Engaged Affect (typically observed): Appropriate   Alcohol / Substance Use: Not Applicable Psych Involvement: No (comment)  Admission diagnosis:  Coronary artery disease involving left main coronary artery [I25.10] S/P CABG x 3 [Z95.1] Patient Active Problem List   Diagnosis Date Noted   S/P CABG x 3 10/22/2022   Coronary artery disease involving left main coronary artery 10/19/2022   Chronic obstructive pulmonary disease, unspecified COPD type (HCC) 10/15/2022   Progressive angina (HCC) 10/15/2022   Type 2 diabetes mellitus with obesity (HCC) 08/18/2021   Cervical radiculopathy 08/04/2020   Encounter for screening colonoscopy    Polyp of transverse colon    Polyp of sigmoid colon    Essential hypertension 05/19/2018   History of vitamin D deficiency 05/18/2018   Tobacco dependence 05/18/2018   Morbid obesity (HCC) 01/13/2018   Allergic rhinitis, seasonal 04/23/2017   Genital herpes 04/23/2017   MDD (major depressive disorder) 05/17/2016   Bronchitis 03/16/2016   Cholesteatoma of attic of right ear 12/05/2015   Nocturnal cough 11/29/2015  GERD (gastroesophageal reflux disease) 11/29/2015   Acid reflux 09/19/2015   Screening for breast cancer 09/19/2015   Chronic pelvic pain in female 09/19/2015   Vitamin  D deficiency 12/13/2014   Anxiety and depression 12/13/2014   History of herpes genitalis 12/13/2014   Hyperlipidemia associated with type 2 diabetes mellitus (HCC) 05/26/2014   Smoker 05/26/2014   PCP:  Danelle Berry, PA-C Pharmacy:   [phone] - [place] [place] - [address] [place] [address] Phone: [phone] Fax: [phone]  [facility] [address] [place] [address] Phone: [phone] Fax: [phone]  Social Determinants of Health (SDOH) Social History: SDOH Screenings   Food Insecurity: Food Insecurity Present (08/31/2022)  Housing: Low Risk  (08/31/2022)  Transportation Needs: No Transportation Needs (08/31/2022)  Utilities: Not At Risk (08/31/2022)  Alcohol Screen: Low Risk  (08/28/2021)  Depression (PHQ2-9): Low Risk  (09/10/2022)  Financial Resource Strain: Medium Risk (08/31/2022)  Physical Activity: Insufficiently Active (08/31/2022)  Social Connections: Socially Isolated (08/31/2022)  Stress: Stress Concern Present (08/31/2022)  Tobacco Use: High Risk (10/23/2022)   Readmission Risk Interventions     No data to display

## 2022-10-23 NOTE — Progress Notes (Signed)
   NAME:  Lauren [name], MRN:  [id number], DOB:  06-22-1975, LOS: 4 ADMISSION DATE:  10/19/2022, CONSULTATION DATE:  5/6 REFERRING MD:  Cliffton Asters , CHIEF COMPLAINT:  post-op critical care    History of Present Illness:  This is a 47 year old female w/ hx as per below. Initially presented to PCP w/ cc exertional CP that had progressed to radiation down both arms. Left heart cath showed EF 50-55% distal LM disease at 90% w. 3V CAD. Went to OR 5/6 and underwent CABG X 3 V (LIMA to LAD, SVG to PDA and SVG to OM. Bypass time 10:40 to 12:07.  Returned to ICU post op critical care consulted post op   Pertinent  Medical History  HTN, HLD, type II DM CAD  Significant Hospital Events: Including procedures, antibiotic start and stop dates in addition to other pertinent events   3V CABG 5/6  5/6 extubated successfuly  Interim History / Subjective:  Extubated successfully yesterday 5/6. NAEON.  Objective   Blood pressure 98/63, pulse 94, temperature 98.8 F (37.1 C), resp. rate 14, height 5\' 3"  (1.6 m), weight 84 kg, SpO2 97 %. CVP:  [0 mmHg-13 mmHg] 8 mmHg CO:  [4.9 L/min-10.4 L/min] 7.4 L/min CI:  [2.7 L/min/m2-5.7 L/min/m2] 4.1 L/min/m2  Vent Mode: SIMV;PSV;PRVC FiO2 (%):  [40 %-50 %] 50 % Set Rate:  [4 bmp-16 bmp] 16 bmp Vt Set:  [410 mL] 410 mL PEEP:  [5 cmH20] 5 cmH20 Pressure Support:  [10 cmH20] 10 cmH20 Plateau Pressure:  [17 cmH20] 17 cmH20   Intake/Output Summary (Last 24 hours) at 10/23/2022 0841 Last data filed at 10/23/2022 0800 Gross per 24 hour  Intake 5767.39 ml  Output 4405 ml  Net 1362.39 ml    Filed Weights   10/19/22 0541 10/23/22 0500  Weight: 79.8 kg 84 kg    Examination: General: Adult female, resting in bed, in NAD. Neuro: A&O x 3., no deficits. HEENT: [place]/AT. Sclerae anicteric. EOMI. Cardiovascular: Sternal dressing C/D/I. RRR, no M/R/G.  Mediastinal drain in place. Lungs: Respirations even and unlabored.  CTA bilaterally, No W/R/R. Abdomen: BS x 4, soft,  NT/ND.  Musculoskeletal: No gross deformities, no edema.  Skin: Intact, warm, no rashes.   Assessment & Plan:   3 vessel CAD now S/p 3V CABG 5/6 Plan Keep euvolemic Mediastinal tubes per surg  Multimodal pain management  Keep euglycemic  Low dose metoprolol   Type II DM  Plan Insulin transition orders today   PCCM will follow while in ICU. Possible transfer to floor later today per TCTS.   Best Practice (right click and "Reselect all SmartList Selections" daily)   Diet/type: Regular consistency (see orders) DVT prophylaxis: LMWH GI prophylaxis: PPI Lines: Central line Foley:  Yes, and it is still needed Code Status:  full code Last date of multidisciplinary goals of care discussion [per primary ]   Rutherford Guys, PA - [facility] For pager details, please see AMION or use Epic chat  After 1900, please call [facility] for cross coverage needs 10/23/2022, 8:48 AM

## 2022-10-23 NOTE — Progress Notes (Signed)
      [address]       [place] [address]             [phone]                 1 Day Post-Op Procedure(s) (LRB): CORONARY ARTERY BYPASS GRAFTING (CABG) TIMES THREE USING THE LEFT INTERNAL MAMMARY ARTERY (LIMA) AND ENDOSCOPICALLY HARVESTED RIGHT GREATER SAPHENOUS VEIN (N/A) TRANSESOPHAGEAL ECHOCARDIOGRAM (N/A)   Events: No events  _______________________________________________________________ Vitals: BP 98/63   Pulse 94   Temp 99.1 F (37.3 C)   Resp 17   Ht 5\' 3"  (1.6 m)   Wt 84 kg   SpO2 97%   BMI 32.80 kg/m  Filed Weights   10/19/22 0541 10/23/22 0500  Weight: 79.8 kg 84 kg     - Neuro: alert NAD  - Cardiovascular: sinus  Drips: none.   CVP:  [0 mmHg-13 mmHg] 0 mmHg CO:  [4.9 L/min-10.4 L/min] 7.4 L/min CI:  [2.7 L/min/m2-5.7 L/min/m2] 4.1 L/min/m2  - Pulm: EWOB   ABG    Component Value Date/Time   PHART 7.387 10/23/2022 0416   PCO2ART 36.4 10/23/2022 0416   PO2ART 82 (L) 10/23/2022 0416   HCO3 21.8 10/23/2022 0416   TCO2 23 10/23/2022 0416   ACIDBASEDEF 3.0 (H) 10/23/2022 0416   O2SAT 96 10/23/2022 0416    - Abd: ND - Extremity: warm  .Intake/Output      05/06 0701 05/07 0700 05/07 0701 05/08 0700   P.O.     I.V. (mL/kg) 3746.1 (44.6)    Blood 682    Other 175    IV Piggyback 1494.3    Total Intake(mL/kg) 6097.4 (72.6)    Urine (mL/kg/hr) 3215 (1.6)    Blood 1000    Chest Tube 380    Total Output 4595    Net +1502.4            _______________________________________________________________ Labs:    Latest Ref Rng & Units 10/23/2022    4:16 AM 10/23/2022    4:15 AM 10/22/2022    7:40 PM  CBC  WBC 4.0 - 10.5 K/uL  9.2    Hemoglobin 12.0 - 15.0 g/dL 01.0  27.2  53.6   Hematocrit 36.0 - 46.0 % 34.0  34.3  31.0   Platelets 150 - 400 K/uL  105        Latest Ref Rng & Units 10/23/2022    4:16 AM 10/23/2022    4:15 AM 10/22/2022   10:05 PM  CMP  Glucose 70 - 99 mg/dL  644  034   BUN 6 - 20 mg/dL  6  10    Creatinine 7.42 - 1.00 mg/dL  5.95  6.38   Sodium 756 - 145 mmol/L 137  135  139   Potassium 3.5 - 5.1 mmol/L 4.1  4.0  3.5   Chloride 98 - 111 mmol/L  106  106   CO2 22 - 32 mmol/L  22  24   Calcium 8.9 - 10.3 mg/dL  7.9  7.8     CXR: clear  _______________________________________________________________  Assessment and Plan: POD 1 s/p CABG  Neuro: pain controlled CV: will remove wires and A Line.  On A/S/BB Pulm: IS, ambulation Renal: creat stable.  diuresing GI: on diet Heme: stable ID: [email or web address] Endo: SSI Dispo: possible floor today   Corliss Skains 10/23/2022 7:37 AM

## 2022-10-23 NOTE — Evaluation (Signed)
Physical Therapy Evaluation Patient Details Name: Lauren [name] MRN: [id number] DOB: [birth year] Today's Date: 10/23/2022  History of Present Illness  47 yo female admitted 5/3 for cardiac cath due to angina. 5/6 CABGx 3. PMHx: HTN, HLD, T2DM, obesity, recent left wrist fx in splint  Clinical Impression  Pt pleasant and reports fall roughly a month ago when dog pulled her down resulting in left wrist fx currently in splint. Pt able to transition OOB to chair and once flo trac removed anticipate she will be able to walk and progress mobility. Pt educated for sternal precautions, transfers and progression with daily mobility with nursing encouraged. Pt with decreased strength, transfers and mobility who will benefit from acute therapy to maximize mobility and safety.   HR 98 SPo2 91-94% on RA BP 124/74        Recommendations for follow up therapy are one component of a multi-disciplinary discharge planning process, led by the attending physician.  Recommendations may be updated based on patient status, additional functional criteria and insurance authorization.  Follow Up Recommendations       Assistance Recommended at Discharge PRN  Patient can return home with the following  A little help with bathing/dressing/bathroom;Assistance with cooking/housework;Assist for transportation;Help with stairs or ramp for entrance    Equipment Recommendations Rolling walker (2 wheels);BSC/3in1  Recommendations for Other Services       Functional Status Assessment Patient has had a recent decline in their functional status and demonstrates the ability to make significant improvements in function in a reasonable and predictable amount of time.     Precautions / Restrictions Precautions Precautions: Sternal;Fall;Other (comment) Precaution Comments: flo trac, chest tube, pacer Required Braces or Orthoses: Splint/Cast Splint/Cast: left wrist splint      Mobility  Bed Mobility Overal bed mobility:  Needs Assistance Bed Mobility: Supine to Sit     Supine to sit: HOB elevated, Min guard     General bed mobility comments: guarding for lines with HOB 25 degrees, cues for sequence    Transfers Overall transfer level: Needs assistance   Transfers: Sit to/from Stand, Bed to chair/wheelchair/BSC Sit to Stand: Min guard           General transfer comment: guarding with cues for hand placement and precautions to rise from bed, pivot to chair without UE support.    Ambulation/Gait               General Gait Details: lines limiting mobility  Stairs            Wheelchair Mobility    Modified Rankin (Stroke Patients Only)       Balance Overall balance assessment: Mild deficits observed, not formally tested                                           Pertinent Vitals/Pain Pain Assessment Pain Assessment: 0-10 Pain Score: 4  Pain Location: incision Pain Descriptors / Indicators: Aching, Sore Pain Intervention(s): Limited activity within patient's tolerance    Home Living Family/patient expects to be discharged to:: Private residence Living Arrangements: Spouse/significant other Available Help at Discharge: Family;Available 24 hours/day Type of Home: House Home Access: Stairs to enter   [employer] of Steps: 6   Home Layout: Two level;Able to live on main level with bedroom/bathroom Home Equipment: None Additional Comments: lives with spouse, 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  Prior Function Prior Level of Function : Independent/Modified Independent;Driving;Working/employed                     Hand Dominance        Extremity/Trunk Assessment   Upper Extremity Assessment Upper Extremity Assessment: Overall WFL for tasks assessed    Lower Extremity Assessment Lower Extremity Assessment: Overall WFL for tasks assessed    Cervical / Trunk Assessment Cervical / Trunk Assessment: Normal   Communication   Communication: No difficulties  Cognition Arousal/Alertness: Awake/alert Behavior During Therapy: WFL for tasks assessed/performed Overall Cognitive Status: Within Functional Limits for tasks assessed                                          General Comments      Exercises General Exercises - Lower Extremity Long Arc Quad: AROM, Both, 15 reps, Seated Hip Flexion/Marching: AROM, Both, 10 reps, Seated   Assessment/Plan    PT Assessment Patient needs continued PT services  PT Problem List Decreased strength;Decreased activity tolerance;Decreased mobility;Decreased knowledge of use of DME;Decreased knowledge of precautions       PT Treatment Interventions DME instruction;Therapeutic exercise;Gait training;Balance training;Stair training;Functional mobility training;Therapeutic activities;Patient/family education    PT Goals (Current goals can be found in the Care Plan section)  Acute Rehab PT Goals Patient Stated Goal: return to work, hike, read PT Goal Formulation: With patient/family Time For Goal Achievement: 11/06/22 Potential to Achieve Goals: Good    Frequency Min 1X/week     Co-evaluation               AM-PAC PT "6 Clicks" Mobility  Outcome Measure Help needed turning from your back to your side while in a flat bed without using bedrails?: A Little Help needed moving from lying on your back to sitting on the side of a flat bed without using bedrails?: A Little Help needed moving to and from a bed to a chair (including a wheelchair)?: A Little Help needed standing up from a chair using your arms (e.g., wheelchair or bedside chair)?: A Little Help needed to walk in hospital room?: A Lot Help needed climbing 3-5 steps with a railing? : Total 6 Click Score: 15    End of Session   Activity Tolerance: Patient tolerated treatment well Patient left: in chair;with call bell/phone within reach;with chair alarm set;with  family/visitor present Nurse Communication: Mobility status;Precautions PT Visit Diagnosis: Other abnormalities of gait and mobility (R26.89);Difficulty in walking, not elsewhere classified (R26.2)    Time: 2725-3664 PT Time Calculation (min) (ACUTE ONLY): 24 min   Charges:   PT Evaluation $PT Eval Moderate Complexity: 1 Mod PT Treatments $Therapeutic Activity: 8-22 mins        Merryl Hacker, PT Acute Rehabilitation Services Office: [phone]   Enedina Finner Gerron Guidotti 10/23/2022, 10:36 AM

## 2022-10-23 NOTE — Anesthesia Postprocedure Evaluation (Signed)
Anesthesia Post Note  Patient: Lauren [name]  Procedure(s) Performed: CORONARY ARTERY BYPASS GRAFTING (CABG) TIMES THREE USING THE LEFT INTERNAL MAMMARY ARTERY (LIMA) AND ENDOSCOPICALLY HARVESTED RIGHT GREATER SAPHENOUS VEIN (Chest) TRANSESOPHAGEAL ECHOCARDIOGRAM     Patient location during evaluation: SICU Anesthesia Type: General Level of consciousness: sedated Pain management: pain level controlled Vital Signs Assessment: post-procedure vital signs reviewed and stable Respiratory status: patient remains intubated per anesthesia plan Cardiovascular status: stable Postop Assessment: no apparent nausea or vomiting Anesthetic complications: no   No notable events documented.  Last Vitals:  Vitals:   10/23/22 1117 10/23/22 1130  BP:    Pulse:  100  Resp:  20  Temp: 37.4 C   SpO2:  92%    Last Pain:  Vitals:   10/23/22 1117  TempSrc: Oral  PainSc:                  Josejuan Hoaglin S

## 2022-10-24 ENCOUNTER — Inpatient Hospital Stay (HOSPITAL_COMMUNITY): Payer: 59

## 2022-10-24 DIAGNOSIS — E119 Type 2 diabetes mellitus without complications: Secondary | ICD-10-CM

## 2022-10-24 LAB — BASIC METABOLIC PANEL
Anion gap: 9 (ref 5–15)
BUN: 7 mg/dL (ref 6–20)
CO2: 25 mmol/L (ref 22–32)
Calcium: 8.3 mg/dL — ABNORMAL LOW (ref 8.9–10.3)
Chloride: 96 mmol/L — ABNORMAL LOW (ref 98–111)
Creatinine, Ser: 0.58 mg/dL (ref 0.44–1.00)
GFR, Estimated: >60 mL/min (ref >60)
Glucose, Bld: 133 mg/dL — ABNORMAL HIGH (ref 70–99)
Potassium: 3.8 mmol/L (ref 3.5–5.1)
Sodium: 130 mmol/L — ABNORMAL LOW (ref 135–145)

## 2022-10-24 LAB — CBC
HCT: 34.4 % — ABNORMAL LOW (ref 36.0–46.0)
Hemoglobin: 11.4 g/dL — ABNORMAL LOW (ref 12.0–15.0)
MCH: 31.5 pg (ref 26.0–34.0)
MCHC: 33.1 g/dL (ref 30.0–36.0)
MCV: 95 fL (ref 80.0–100.0)
Platelets: 110 10*3/uL — ABNORMAL LOW (ref 150–400)
RBC: 3.62 MIL/uL — ABNORMAL LOW (ref 3.87–5.11)
RDW: 13.4 % (ref 11.5–15.5)
WBC: 10.5 10*3/uL (ref 4.0–10.5)
nRBC: 0 % (ref 0.0–0.2)

## 2022-10-24 LAB — GLUCOSE, CAPILLARY
Glucose-Capillary: 126 mg/dL — ABNORMAL HIGH (ref 70–99)
Glucose-Capillary: 134 mg/dL — ABNORMAL HIGH (ref 70–99)
Glucose-Capillary: 135 mg/dL — ABNORMAL HIGH (ref 70–99)
Glucose-Capillary: 146 mg/dL — ABNORMAL HIGH (ref 70–99)
Glucose-Capillary: 153 mg/dL — ABNORMAL HIGH (ref 70–99)
Glucose-Capillary: 164 mg/dL — ABNORMAL HIGH (ref 70–99)

## 2022-10-24 LAB — LIPOPROTEIN A (LPA): Lipoprotein (a): 93.1 nmol/L — ABNORMAL HIGH (ref <75.0)

## 2022-10-24 LAB — MAGNESIUM: Magnesium: 1.7 mg/dL (ref 1.7–2.4)

## 2022-10-24 MED ORDER — ~~LOC~~ CARDIAC SURGERY, PATIENT & FAMILY EDUCATION: Freq: Once | Status: AC

## 2022-10-24 MED ORDER — SODIUM CHLORIDE 0.9% FLUSH: 3.0000 mL | INTRAVENOUS | Status: DC | PRN

## 2022-10-24 MED ORDER — SODIUM CHLORIDE 0.9% FLUSH
3.0000 mL | Freq: Two times a day (BID) | INTRAVENOUS | Status: DC
  Administered 2022-10-24 (×2): 3 mL via INTRAVENOUS

## 2022-10-24 MED ORDER — FUROSEMIDE 40 MG PO TABS
40.0000 mg | ORAL_TABLET | Freq: Every day | ORAL | Status: DC
  Administered 2022-10-24 – 2022-10-26 (×3): 40 mg via ORAL
  Filled 2022-10-24 (×3): qty 1

## 2022-10-24 MED ORDER — MAGNESIUM SULFATE 2 GM/50ML IV SOLN
2.0000 g | Freq: Once | INTRAVENOUS | Status: AC
  Administered 2022-10-24: 2 g via INTRAVENOUS
  Filled 2022-10-24: qty 50

## 2022-10-24 MED ORDER — POTASSIUM CHLORIDE CRYS ER 20 MEQ PO TBCR
20.0000 meq | EXTENDED_RELEASE_TABLET | ORAL | Status: AC
  Administered 2022-10-24 (×3): 20 meq via ORAL
  Filled 2022-10-24 (×3): qty 1

## 2022-10-24 MED ORDER — POTASSIUM CHLORIDE CRYS ER 20 MEQ PO TBCR
40.0000 meq | EXTENDED_RELEASE_TABLET | Freq: Every day | ORAL | Status: DC
  Administered 2022-10-24 – 2022-10-26 (×3): 40 meq via ORAL
  Filled 2022-10-24 (×3): qty 2

## 2022-10-24 MED ORDER — SODIUM CHLORIDE 0.9 % IV SOLN: 250.0000 mL | INTRAVENOUS | Status: DC | PRN

## 2022-10-24 NOTE — Progress Notes (Signed)
   NAME:  DENIKA BEHMER, MRN:  [id number], DOB:  [birth year], LOS: 5 ADMISSION DATE:  10/19/2022, CONSULTATION DATE:  5/6 REFERRING MD:  Cliffton Asters , CHIEF COMPLAINT:  post-op critical care    History of Present Illness:  This is a 47 year old female w/ hx as per below. Initially presented to PCP w/ cc exertional CP that had progressed to radiation down both arms. Left heart cath showed EF 50-55% distal LM disease at 90% w. 3V CAD. Went to OR 5/6 and underwent CABG X 3 V (LIMA to LAD, SVG to PDA and SVG to OM. Bypass time 10:40 to 12:07.  Returned to ICU post op critical care consulted post op   Pertinent  Medical History  HTN, HLD, type II DM CAD  Significant Hospital Events: Including procedures, antibiotic start and stop dates in addition to other pertinent events   3V CABG 5/6  5/6 extubated successfuly  Interim History / Subjective:  NAEON. Feels good. Up in chair eating breakfast this AM. Randie Heinz UOP 1.9L past 24 hours  Objective   Blood pressure 111/66, pulse 87, temperature 98.7 F (37.1 C), temperature source Oral, resp. rate 16, height 5\' 3"  (1.6 m), weight 82.7 kg, SpO2 96 %. CVP:  [8 mmHg] 8 mmHg      Intake/Output Summary (Last 24 hours) at 10/24/2022 0743 Last data filed at 10/24/2022 0700 Gross per 24 hour  Intake 640.33 ml  Output 2150 ml  Net -1509.67 ml    Filed Weights   10/19/22 0541 10/23/22 0500 10/24/22 0500  Weight: 79.8 kg 84 kg 82.7 kg    Examination: General: Adult female, sitting up in chair eating breakfast, in NAD. Neuro: A&O x 3., no deficits. HEENT: [place]/AT. Sclerae anicteric. EOMI. Cardiovascular: Sternal dressing C/D/I. RRR, no M/R/G.  Mediastinal drain in place. Lungs: Respirations even and unlabored.  CTA bilaterally, No W/R/R. Abdomen: BS x 4, soft, NT/ND.  Musculoskeletal: No gross deformities, no edema.  Skin: Intact, warm, no rashes.   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 PCCM will follow while in ICU.    Rutherford Guys, PA - [facility] For pager details, please see AMION or use Epic chat  After 1900, please call [facility] for cross coverage needs 10/24/2022, 7:43 AM

## 2022-10-24 NOTE — Progress Notes (Signed)
      [address]       [place] [address]             [phone]                 2 Days Post-Op Procedure(s) (LRB): CORONARY ARTERY BYPASS GRAFTING (CABG) TIMES THREE USING THE LEFT INTERNAL MAMMARY ARTERY (LIMA) AND ENDOSCOPICALLY HARVESTED RIGHT GREATER SAPHENOUS VEIN (N/A) TRANSESOPHAGEAL ECHOCARDIOGRAM (N/A)   Events: No events  _______________________________________________________________ Vitals: BP 111/66   Pulse 87   Temp 99.1 F (37.3 C) (Oral)   Resp 16   Ht 5\' 3"  (1.6 m)   Wt 82.7 kg   SpO2 96%   BMI 32.30 kg/m  Filed Weights   10/19/22 0541 10/23/22 0500 10/24/22 0500  Weight: 79.8 kg 84 kg 82.7 kg     - Neuro: alert NAD  - Cardiovascular: sinus  Drips: none.      - Pulm: EWOB   ABG    Component Value Date/Time   PHART 7.387 10/23/2022 0416   PCO2ART 36.4 10/23/2022 0416   PO2ART 82 (L) 10/23/2022 0416   HCO3 21.8 10/23/2022 0416   TCO2 23 10/23/2022 0416   ACIDBASEDEF 3.0 (H) 10/23/2022 0416   O2SAT 96 10/23/2022 0416    - Abd: ND - Extremity: warm  .Intake/Output      05/07 0701 05/08 0700 05/08 0701 05/09 0700   I.V. (mL/kg) 340.3 (4.1)    Blood     Other     IV Piggyback 300    Total Intake(mL/kg) 640.3 (7.7)    Urine (mL/kg/hr) 1930 (1)    Blood     Chest Tube 220    Total Output 2150    Net -1509.7         Urine Occurrence 1 x       _______________________________________________________________ Labs:    Latest Ref Rng & Units 10/24/2022    4:29 AM 10/23/2022    4:52 PM 10/23/2022    4:16 AM  CBC  WBC 4.0 - 10.5 K/uL 10.5  10.3    Hemoglobin 12.0 - 15.0 g/dL 46.9  62.9  52.8   Hematocrit 36.0 - 46.0 % 34.4  34.2  34.0   Platelets 150 - 400 K/uL 110  110        Latest Ref Rng & Units 10/24/2022    4:29 AM 10/23/2022    4:52 PM 10/23/2022    4:16 AM  CMP  Glucose 70 - 99 mg/dL 413  244    BUN 6 - 20 mg/dL 7  7    Creatinine 0.10 - 1.00 mg/dL 2.72  5.36    Sodium 644 - 145 mmol/L 130  134  137    Potassium 3.5 - 5.1 mmol/L 3.8  4.0  4.1   Chloride 98 - 111 mmol/L 96  100    CO2 22 - 32 mmol/L 25  26    Calcium 8.9 - 10.3 mg/dL 8.3  8.2      CXR: clear  _______________________________________________________________  Assessment and Plan: POD 1 s/p CABG  Neuro: pain controlled CV: will remove wires and A Line.  On A/S/BB Pulm: IS, ambulation Renal: creat stable.  diuresing GI: on diet Heme: stable ID: [email or web address] Endo: SSI Dispo:  floor today   Lauren [name] Lauren [name] 10/24/2022 8:40 AM

## 2022-10-24 NOTE — Inpatient Diabetes Management (Addendum)
Inpatient Diabetes Program Recommendations  AACE/ADA: New Consensus Statement on Inpatient Glycemic Control (2015)  Target Ranges:  Prepandial:   less than 140 mg/dL      Peak postprandial:   less than 180 mg/dL (1-2 hours)      Critically ill patients:  140 - 180 mg/dL   Lab Results  Component Value Date   GLUCAP 164 (H) 10/24/2022   HGBA1C 8.5 (H) 10/21/2022    Review of Glycemic Control  Latest Reference Range & Units 10/23/22 11:19 10/23/22 16:23 10/23/22 19:41 10/23/22 23:12 10/24/22 04:34 10/24/22 07:48  Glucose-Capillary 70 - 99 mg/dL 161 (H) 096 (H) 045 (H) 142 (H) 134 (H) 164 (H)   Diabetes history: DM  Outpatient Diabetes medications:  Rybelsus 3 mg daily Farxiga 10 mg daily Current orders for Inpatient glycemic control:  Novolog 0-20 units q 4 hours Levemir 5 units bid  Inpatient Diabetes Program Recommendations:    Consider changing Novolog correction to tid with meals (instead of q 4 hours).   Addendum 1500: spoke with patient at bedside.  She states that A1C 8 months ago was >13%.  Congratulated her on A1C of 8.5%.  She is taking DM medications as ordered at home.  Discussed importance of glycemic control, monitoring, and follow-up with PCP.  Reminded patient of keeping blood sugars less than 180 mg/dL and goal W0J of 7%.  Patient verbalized understanding.   Thanks,  Beryl Meager, RN, BC-ADM Inpatient Diabetes Coordinator Pager [phone]  (8a-5p)

## 2022-10-24 NOTE — Progress Notes (Signed)
CARDIAC REHAB PHASE I    Pt resting in bed, feeling well today. Reports she has has busy day with PT/OT and move to new room. Ambulated 2 times today in hall and in room a few times. Ordering lunch now and will rest for this afternoon. Encouraged continued mobility and IS use. Will continue to follow.   [id number]  Woodroe Chen, RN BSN 10/24/2022 2:22 PM

## 2022-10-24 NOTE — Progress Notes (Signed)
Physical Therapy Treatment Patient Details Name: Lauren [name] MRN: [id number] DOB: 28-Sep-1975 Today's Date: 10/24/2022   History of Present Illness 47 yo female admitted 5/3 for cardiac cath due to angina. 5/6 CABGx 3. PMHx: HTN, HLD, T2DM, obesity, recent left wrist fx in splint    PT Comments    Pt pleasant and reports incisional pain with ability to recall 2/4 precautions. Pt educated for all restrictions, progressive activity and plan for attempting gait without AD and stairs next session. Do not anticipate therapy needs post acute with pt in agreement. Will continue to follow to maximize independence.   HR 86-90 SPo2 92% on RA requiring 1 L with gait due to drop to 88% and maintaining 90-95% on 1L Pre gait 101/65, post gait 126/69    Recommendations for follow up therapy are one component of a multi-disciplinary discharge planning process, led by the attending physician.  Recommendations may be updated based on patient status, additional functional criteria and insurance authorization.  Follow Up Recommendations       Assistance Recommended at Discharge PRN  Patient can return home with the following A little help with bathing/dressing/bathroom;Assistance with cooking/housework;Assist for transportation;Help with stairs or ramp for entrance   Equipment Recommendations  Rolling walker (2 wheels);BSC/3in1    Recommendations for Other Services       Precautions / Restrictions Precautions Precautions: Sternal;Fall;Other (comment) Precaution Comments: chest tube Splint/Cast: left wrist splint Restrictions Weight Bearing Restrictions: Yes RUE Weight Bearing: Non weight bearing LUE Weight Bearing: Non weight bearing     Mobility  Bed Mobility Overal bed mobility: Needs Assistance Bed Mobility: Sit to Supine     Supine to sit: Supervision     General bed mobility comments: bed flat with assist for lines and cues for sequence    Transfers Overall transfer level:  Needs assistance   Transfers: Sit to/from Stand Sit to Stand: Supervision           General transfer comment: cues for hand placement, precautions and safety from chair and toilet. pt performed 5 repeated sit to stands at EOB in 22 sec    Ambulation/Gait Ambulation/Gait assistance: Min guard Gait Distance (Feet): 400 Feet Assistive device: Rolling walker (2 wheels) Gait Pattern/deviations: Step-through pattern, Decreased stride length, Trunk flexed   Gait velocity interpretation: <1.8 ft/sec, indicate of risk for recurrent falls   General Gait Details: cues for posture and proximity to RW anticipate attempting gait without RW next session   Stairs             Wheelchair Mobility    Modified Rankin (Stroke Patients Only)       Balance Overall balance assessment: No apparent balance deficits (not formally assessed)                                          Cognition Arousal/Alertness: Awake/alert Behavior During Therapy: WFL for tasks assessed/performed Overall Cognitive Status: Impaired/Different from baseline Area of Impairment: Memory                     Memory: Decreased recall of precautions         General Comments: pt recalling 2/4 precautions with education for all        Exercises      General Comments        Pertinent Vitals/Pain Pain Assessment Pain Score: 6  Pain Location: incision  Pain Descriptors / Indicators: Aching, Sore Pain Intervention(s): Limited activity within patient's tolerance, Monitored during session, Repositioned    Home Living                          Prior Function            PT Goals (current goals can now be found in the care plan section) Progress towards PT goals: Progressing toward goals    Frequency    Min 1X/week      PT Plan Discharge plan needs to be updated    Co-evaluation              AM-PAC PT "6 Clicks" Mobility   Outcome Measure  Help  needed turning from your back to your side while in a flat bed without using bedrails?: A Little Help needed moving from lying on your back to sitting on the side of a flat bed without using bedrails?: A Little Help needed moving to and from a bed to a chair (including a wheelchair)?: A Little Help needed standing up from a chair using your arms (e.g., wheelchair or bedside chair)?: A Little Help needed to walk in hospital room?: A Little Help needed climbing 3-5 steps with a railing? : A Little 6 Click Score: 18    End of Session   Activity Tolerance: Patient tolerated treatment well Patient left: in bed;with call bell/phone within reach;with family/visitor present Nurse Communication: Mobility status;Precautions PT Visit Diagnosis: Other abnormalities of gait and mobility (R26.89);Difficulty in walking, not elsewhere classified (R26.2)     Time: 1610-9604 PT Time Calculation (min) (ACUTE ONLY): 23 min  Charges:  $Gait Training: 8-22 mins $Therapeutic Activity: 8-22 mins                     Merryl Hacker, PT Acute Rehabilitation Services Office: [phone]    Enedina Finner Shoichi Mielke 10/24/2022, 10:24 AM

## 2022-10-24 NOTE — Discharge Summary (Signed)
[address]       [place] [address]             [phone]    Physician Discharge Summary  Patient ID: MAIA MCCALMONT MRN: [id number] DOB/AGE: 1976-05-06 47 y.o.  Admit date: 10/19/2022 Discharge date: 10/26/2022  Admission Diagnoses:  Patient Active Problem List   Diagnosis Date Noted   Coronary artery disease involving left main coronary artery 10/19/2022   Chronic obstructive pulmonary disease, unspecified COPD type (HCC) 10/15/2022   Progressive angina (HCC) 10/15/2022   Type 2 diabetes mellitus with obesity (HCC) 08/18/2021   Cervical radiculopathy 08/04/2020   Encounter for screening colonoscopy    Polyp of transverse colon    Polyp of sigmoid colon    Essential hypertension 05/19/2018   History of vitamin D deficiency 05/18/2018   Tobacco dependence 05/18/2018   Morbid obesity (HCC) 01/13/2018   Allergic rhinitis, seasonal 04/23/2017   Genital herpes 04/23/2017   MDD (major depressive disorder) 05/17/2016   Bronchitis 03/16/2016   Cholesteatoma of attic of right ear 12/05/2015   Nocturnal cough 11/29/2015   GERD (gastroesophageal reflux disease) 11/29/2015   Acid reflux 09/19/2015   Screening for breast cancer 09/19/2015   Chronic pelvic pain in female 09/19/2015   Vitamin D deficiency 12/13/2014   Anxiety and depression 12/13/2014   History of herpes genitalis 12/13/2014   Hyperlipidemia associated with type 2 diabetes mellitus (HCC) 05/26/2014   Smoker 05/26/2014   Discharge Diagnoses:  Patient Active Problem List   Diagnosis Date Noted   S/P CABG x 3 10/22/2022   Coronary artery disease involving left main coronary artery 10/19/2022   Chronic obstructive pulmonary disease, unspecified COPD type (HCC) 10/15/2022   Progressive angina (HCC) 10/15/2022   Type 2 diabetes mellitus with obesity (HCC) 08/18/2021   Cervical radiculopathy 08/04/2020   Encounter for screening colonoscopy    Polyp of transverse colon    Polyp of sigmoid  colon    Essential hypertension 05/19/2018   History of vitamin D deficiency 05/18/2018   Tobacco dependence 05/18/2018   Morbid obesity (HCC) 01/13/2018   Allergic rhinitis, seasonal 04/23/2017   Genital herpes 04/23/2017   MDD (major depressive disorder) 05/17/2016   Bronchitis 03/16/2016   Cholesteatoma of attic of right ear 12/05/2015   Nocturnal cough 11/29/2015   GERD (gastroesophageal reflux disease) 11/29/2015   Acid reflux 09/19/2015   Screening for breast cancer 09/19/2015   Chronic pelvic pain in female 09/19/2015   Vitamin D deficiency 12/13/2014   Anxiety and depression 12/13/2014   History of herpes genitalis 12/13/2014   Hyperlipidemia associated with type 2 diabetes mellitus (HCC) 05/26/2014   Smoker 05/26/2014   Discharged Condition: good  History of Present Illness:       Rasheta Farrah is a 47 yo obese female with history of HTN, HLD, and Type 2 DM.  She presented to her Primary care physician with complaints of chest pain.  This had been occurring for months and she described the pain as mainly being exertional but had progressed to occurring at rest.  She describes it as pressure and it radiated down both arms.  Due to these symptoms she was referred to Dr. [name] who recommended the patient undergo catheterization.  This was performed today and showed multivessel CAD with LM involvement.  It was felt coronary bypass grafting would be indicated and she was admitted, started on IV Heparin and NTG.  Currently the patient is chest okay.  She does  state that recently after she eats she notices she has chest discomfort.  This would not relieve with antacids.  She has been requiring NTG recently to help with chest pain.  She states she has not had to use more than 2 tablets.  Her symptoms were first noted about 2 months ago while hiking in [place] and she was worried she wouldn't get back down the mountain due to her discomfort.  She is a smoker of about 1 pack per day.  She used to  be fairly active and would go to the gym 30 min per day, however she is no longer doing this.  She has a positive family history of CAD noting her dad and brother have both had early heart disease.  She does not wish to stay in the hospital prior to surgery.  She wants to go home.   Dr. Cliffton Asters reviewed the patient's diagnostic studies and determined surgical intervention would benefit this patient. He reviewed treatment options with the patient as well as the risks and benefits of surgery. Ms. Sitze was agreeable to proceed with surgery.  Hospital Course: Ms. Bough was admitted to [facility] on 05/03. She remained in the hospital in stable condition until she was brought to the operating room on 10/22/22. She underwent CABG x 3 utilizing SVG to PDA, SVG to OM and LIMA to LAD as well as endoscopic harvest of the right greater saphenous vein. She tolerated the procedure well and was transferred to the SICU in stable condition.  The patient was extubated.  The patient's pacing wires were removed without difficulty.  She was started on diuretics for volume overloaded state.  She was in NSR.  She underwent removal of chest tubes and arterial lines.  She was stable for transfer to the progressive care unit on 10/24/2022.  Consults: pulmonary/intensive care  Significant Diagnostic Studies:   Angiography:     Mid LM to Ost LAD lesion is 95% stenosed with 95% stenosed side branch in Ost Cx.   Mid RCA lesion is 99% stenosed.   RPAV lesion is 90% stenosed.   The left ventricular systolic function is normal.   LV end diastolic pressure is normal.   The left ventricular ejection fraction is 50-55% by visual estimate.   POST-OPERATIVE DIAGNOSIS:   Low normal EF roughly 50 to 55%.  Difficult to assess wall motion due to ectopy.  Will check 2D echo. Severe multivessel CAD: Distal left main 90% heavily calcified eccentric. Mid LAD eccentric 30%, otherwise the LAD has several small diagonal branches  and tapers into a relatively small caliber vessel that reaches the apex. The LCx courses essentially has a lateral OM branch with several small branches that bifurcates distally.  Inferior branch has a 95% stenosis Mid RCA focal eccentric 95% subtotal occlusion; RCA bifurcates into RPA V and PDA There is a 90% stenosis at the RPA V-RPL 1 bifurcation     PLAN OF CARE: Admit to inpatient  -> will initiate low-dose IV nitroglycerin infusion as well as start IV heparin 2 hours after TR band removal based on distal left main disease.  CVTS consulted-will hope for urgent CABG -> will help for Monday, but potentially Saturday or Sunday if she destabilizes. Will titrate GDMT over the weekend. Sliding scale insulin Smoking cessation counseling   Treatments: surgery:   10/22/2022 Patient:  Jeannetta Nap Pre-Op Dx: LM/3V CAD HTN HLP DM Obesity  Post-op Dx:  same Procedure: CABG X 3.  LIMA LAD, RSVG  PDA, OM1   Endoscopic greater saphenous vein harvest on the right     Surgeon and Role:      * Lightfoot, Eliezer Lofts, MD - Primary    * B. Stehler , PA-C - assisting An experienced assistant was required given the complexity of this surgery and the standard of surgical care. The assistant was needed for exposure, dissection, suctioning, retraction of delicate tissues and sutures, instrument exchange and for overall help during this procedure.     Discharge Exam: Blood pressure 118/63, pulse 74, temperature 99.2 F (37.3 C), temperature source Oral, resp. rate 15, height 5\' 3"  (1.6 m), weight 81.7 kg, SpO2 95 %.  General appearance: alert, cooperative, and no distress Heart: regular rate and rhythm Lungs: clear to auscultation bilaterally Abdomen: soft, non-tender; bowel sounds normal; no masses,  no organomegaly Extremities: edema trace Wound: clean and dry   Discharge Medications:  The patient has been discharged on:   1.Beta Blocker:  Yes [  X ]                              No   [    ]                              If No, reason:  2.Ace Inhibitor/ARB: Yes [   ]                                     No  [  X  ]                                     If No, reason: hypotension  3.Statin:   Yes [ x  ]                  No  [   ]                  If No, reason:  4.Ecasa:  Yes  [ x  ]                  No   [   ]                  If No, reason:  Patient had ACS upon admission:  No  Plavix/P2Y12 inhibitor: Yes [   ]                                      No  [ x  ]     Discharge Instructions     Amb Referral to Cardiac Rehabilitation   Complete by: As directed    Diagnosis: CABG   CABG X ___: 3   After initial evaluation and assessments completed: Virtual Based Care may be provided alone or in conjunction with Phase 2 Cardiac Rehab based on patient barriers.: Yes   Intensive Cardiac Rehabilitation (ICR) [facility] location only OR Traditional Cardiac Rehabilitation (TCR) *If criteria for ICR are not met will enroll in TCR [facility] only): Yes      Allergies as of 10/26/2022       Reactions  Cephalexin Other (See Comments)   Secondary yeast infection   Metformin And Related Nausea And Vomiting   With all doses and regular and extended release forms        Medication List     STOP taking these medications    lisinopril 10 MG tablet Commonly known as: ZESTRIL   sitaGLIPtin 100 MG tablet Commonly known as: JANUVIA       TAKE these medications    acetaminophen 500 MG tablet Commonly known as: TYLENOL Take 1 tablet (500 mg total) by mouth every 6 (six) hours as needed.   aspirin EC 81 MG tablet Take 1 tablet (81 mg total) by mouth daily. Swallow whole. What changed: when to take this   b complex vitamins capsule Take 1 capsule by mouth daily.   carvedilol 3.125 MG tablet Commonly known as: COREG Take 1 tablet (3.125 mg total) by mouth 2 (two) times daily.   cetirizine 10 MG tablet Commonly known as: ZYRTEC Take 10 mg by mouth daily as needed for  allergies.   dapagliflozin propanediol 10 MG Tabs tablet Commonly known as: Farxiga Take 1 tablet (10 mg total) by mouth daily. What changed: when to take this   fluticasone 50 MCG/ACT nasal spray Commonly known as: FLONASE Place 1 spray into both nostrils daily as needed for allergies.   furosemide 40 MG tablet Commonly known as: LASIX Take 1 tablet (40 mg total) by mouth daily.   nitroGLYCERIN 0.3 MG SL tablet Commonly known as: Nitrostat Place 1 tablet (0.3 mg total) under the tongue every 5 (five) minutes as needed for chest pain.   omeprazole 20 MG tablet Commonly known as: PRILOSEC OTC Take 20 mg by mouth daily as needed (acid reflux).   potassium chloride SA 20 MEQ tablet Commonly known as: KLOR-CON M Take 1 tablet (20 mEq total) by mouth daily.   rosuvastatin 40 MG tablet Commonly known as: CRESTOR Take 1 tablet (40 mg total) by mouth daily.   Rybelsus 3 MG Tabs Generic drug: Semaglutide Take 1 tablet (3 mg total) by mouth daily.   traMADol 50 MG tablet Commonly known as: ULTRAM Take 1 tablet (50 mg total) by mouth every 4 (four) hours as needed for moderate pain.   valACYclovir 1000 MG tablet Commonly known as: VALTREX Take 1 tablet (1,000 mg total) by mouth daily. Take as needed for herpes outbreak. What changed:  when to take this reasons to take this additional instructions               Durable Medical Equipment  (From admission, onward)           Start     Ordered   10/24/22 1327  For home use only DME 3 n 1  Once        10/24/22 1326   10/24/22 1326  For home use only DME Walker rolling  Once       Question Answer Comment  Walker: With 5 Inch Wheels   Patient needs a walker to treat with the following condition S/P CABG (coronary artery bypass graft)      10/24/22 1326            Follow-up Information     Lightfoot, Eliezer Lofts, MD Follow up.   Specialty: Cardiothoracic Surgery Why: This is a telephone appointment. You do  not need to report to our office Contact information: [address] [place] [address] [phone]  SignedLowella Dandy, PA-C  10/26/2022, 9:27 AM

## 2022-10-25 LAB — BASIC METABOLIC PANEL
Anion gap: 6 (ref 5–15)
BUN: 9 mg/dL (ref 6–20)
CO2: 26 mmol/L (ref 22–32)
Calcium: 8.1 mg/dL — ABNORMAL LOW (ref 8.9–10.3)
Chloride: 102 mmol/L (ref 98–111)
Creatinine, Ser: 0.53 mg/dL (ref 0.44–1.00)
GFR, Estimated: >60 mL/min (ref >60)
Glucose, Bld: 132 mg/dL — ABNORMAL HIGH (ref 70–99)
Potassium: 3.7 mmol/L (ref 3.5–5.1)
Sodium: 134 mmol/L — ABNORMAL LOW (ref 135–145)

## 2022-10-25 LAB — CBC
HCT: 30.3 % — ABNORMAL LOW (ref 36.0–46.0)
Hemoglobin: 9.8 g/dL — ABNORMAL LOW (ref 12.0–15.0)
MCH: 31.1 pg (ref 26.0–34.0)
MCHC: 32.3 g/dL (ref 30.0–36.0)
MCV: 96.2 fL (ref 80.0–100.0)
Platelets: 116 10*3/uL — ABNORMAL LOW (ref 150–400)
RBC: 3.15 MIL/uL — ABNORMAL LOW (ref 3.87–5.11)
RDW: 13.5 % (ref 11.5–15.5)
WBC: 7.8 10*3/uL (ref 4.0–10.5)
nRBC: 0 % (ref 0.0–0.2)

## 2022-10-25 LAB — BPAM RBC
Blood Product Expiration Date: 202405242359
Blood Product Expiration Date: 202405282359
Unit Type and Rh: 6200

## 2022-10-25 LAB — TYPE AND SCREEN
ABO/RH(D): A POS
Unit division: 0
Unit division: 0

## 2022-10-25 LAB — GLUCOSE, CAPILLARY
Glucose-Capillary: 100 mg/dL — ABNORMAL HIGH (ref 70–99)
Glucose-Capillary: 126 mg/dL — ABNORMAL HIGH (ref 70–99)
Glucose-Capillary: 120 mg/dL — ABNORMAL HIGH (ref 70–99)
Glucose-Capillary: 121 mg/dL — ABNORMAL HIGH (ref 70–99)

## 2022-10-25 MED ORDER — CARVEDILOL 3.125 MG PO TABS
3.1250 mg | ORAL_TABLET | Freq: Two times a day (BID) | ORAL | Status: DC
  Administered 2022-10-25 – 2022-10-26 (×3): 3.125 mg via ORAL
  Filled 2022-10-25 (×3): qty 1

## 2022-10-25 MED ORDER — INSULIN ASPART 100 UNIT/ML IJ SOLN
0.0000 [IU] | Freq: Three times a day (TID) | INTRAMUSCULAR | Status: DC
  Administered 2022-10-25 – 2022-10-26 (×2): 3 [IU] via SUBCUTANEOUS

## 2022-10-25 MED FILL — Lidocaine HCl Local Preservative Free (PF) Inj 2%: INTRAMUSCULAR | Qty: 14 | Status: AC

## 2022-10-25 MED FILL — Heparin Sodium (Porcine) Inj 1000 Unit/ML: Qty: 1000 | Status: AC

## 2022-10-25 MED FILL — Potassium Chloride Inj 2 mEq/ML: INTRAVENOUS | Qty: 40 | Status: AC

## 2022-10-25 NOTE — Progress Notes (Signed)
      [address]       [place] [address]             [phone]       3 Days Post-Op Procedure(s) (LRB): CORONARY ARTERY BYPASS GRAFTING (CABG) TIMES THREE USING THE LEFT INTERNAL MAMMARY ARTERY (LIMA) AND ENDOSCOPICALLY HARVESTED RIGHT GREATER SAPHENOUS VEIN (N/A) TRANSESOPHAGEAL ECHOCARDIOGRAM (N/A)  Subjective:  Patient feeling good.  Wants to go home.  She is ambulating without difficulty.  No BM yet, but is passing gas  Objective: Vital signs in last 24 hours: Temp:  [98.7 F (37.1 C)-99.3 F (37.4 C)] 99.2 F (37.3 C) (05/09 0359) Pulse Rate:  [77-85] 85 (05/09 0359) Cardiac Rhythm: Normal sinus rhythm (05/08 1910) Resp:  [14-23] 20 (05/09 0359) BP: (94-113)/(56-73) 102/65 (05/09 0359) SpO2:  [94 %-98 %] 95 % (05/09 0359) Weight:  [81.5 kg] 81.5 kg (05/09 0359)  Intake/Output from previous day: 05/08 0701 - 05/09 0700 In: 410.1 [P.O.:360; IV Piggyback:50.1] Out: -   General appearance: alert, cooperative, and no distress Heart: regular rate and rhythm Lungs: clear to auscultation bilaterally Abdomen: soft, non-tender; bowel sounds normal; no masses,  no organomegaly Extremities: edema trace Wound: clean and dry  Lab Results: Recent Labs    10/24/22 0429 10/25/22 0135  WBC 10.5 7.8  HGB 11.4* 9.8*  HCT 34.4* 30.3*  PLT 110* 116*   BMET:  Recent Labs    10/24/22 0429 10/25/22 0135  NA 130* 134*  K 3.8 3.7  CL 96* 102  CO2 25 26  GLUCOSE 133* 132*  BUN 7 9  CREATININE 0.58 0.53  CALCIUM 8.3* 8.1*    PT/INR:  Recent Labs    10/22/22 1323  LABPROT 21.6*  INR 1.9*   ABG    Component Value Date/Time   PHART 7.387 10/23/2022 0416   HCO3 21.8 10/23/2022 0416   TCO2 23 10/23/2022 0416   ACIDBASEDEF 3.0 (H) 10/23/2022 0416   O2SAT 96 10/23/2022 0416   CBG (last 3)  Recent Labs    10/24/22 1955 10/24/22 2353 10/25/22 0401  GLUCAP 135* 126* 100*    Assessment/Plan: S/P Procedure(s) (LRB): CORONARY ARTERY BYPASS  GRAFTING (CABG) TIMES THREE USING THE LEFT INTERNAL MAMMARY ARTERY (LIMA) AND ENDOSCOPICALLY HARVESTED RIGHT GREATER SAPHENOUS VEIN (N/A) TRANSESOPHAGEAL ECHOCARDIOGRAM (N/A)  CV- NSR, BP is low-- will transition to low dose Coreg which she took prior to admission Pulm- no acute issues, continue IS Renal- creatinine stable, weight is slightly above baseline.. continue Lasix, potassium Expected post operative blood loss anemia- Hgb at 9.8 DM- cbgs are controlled, continue current insulin regimen Dispo- patient stable, doing well.. will transition to home regimen of Coreg, continue Lasix, potassium.Marland Kitchen if remains stable, will d/c home in AM   LOS: 6 days   Lowella Dandy, PA-C 10/25/2022

## 2022-10-25 NOTE — Progress Notes (Signed)
CARDIAC REHAB PHASE I     Post [facility] education including site care, restrictions, risk factors, exercise guidelines, sternal precautions, home needs at discharge, heart healthy diabetic diet, IS use at home and CRP2 reviewed. All questions and concerns addressed. Will refer to [facility] for CRP2. Pt would like to nap and walk later today. Ambulated in hall once today. Encouraged ambulation and IS use.   1430-1500  Woodroe Chen, RN BSN 10/25/2022 2:54 PM

## 2022-10-26 DIAGNOSIS — Z720 Tobacco use: Secondary | ICD-10-CM

## 2022-10-26 DIAGNOSIS — E782 Mixed hyperlipidemia: Secondary | ICD-10-CM

## 2022-10-26 DIAGNOSIS — Z951 Presence of aortocoronary bypass graft: Secondary | ICD-10-CM | POA: Diagnosis not present

## 2022-10-26 DIAGNOSIS — I2510 Atherosclerotic heart disease of native coronary artery without angina pectoris: Secondary | ICD-10-CM | POA: Diagnosis not present

## 2022-10-26 LAB — GLUCOSE, CAPILLARY: Glucose-Capillary: 135 mg/dL — ABNORMAL HIGH (ref 70–99)

## 2022-10-26 MED ORDER — ACETAMINOPHEN 500 MG PO TABS: 500.0000 mg | ORAL_TABLET | Freq: Four times a day (QID) | ORAL | 0 refills | Status: AC | PRN

## 2022-10-26 MED ORDER — TRAMADOL HCL 50 MG PO TABS: 50.0000 mg | ORAL_TABLET | ORAL | 0 refills | Status: DC | PRN

## 2022-10-26 MED ORDER — POTASSIUM CHLORIDE CRYS ER 20 MEQ PO TBCR: 20.0000 meq | EXTENDED_RELEASE_TABLET | Freq: Every day | ORAL | 0 refills | Status: DC

## 2022-10-26 MED ORDER — FUROSEMIDE 40 MG PO TABS: 40.0000 mg | ORAL_TABLET | Freq: Every day | ORAL | 0 refills | Status: DC

## 2022-10-26 MED FILL — Sodium Bicarbonate IV Soln 8.4%: INTRAVENOUS | Qty: 50 | Status: AC

## 2022-10-26 MED FILL — Calcium Chloride Inj 10%: INTRAVENOUS | Qty: 10 | Status: AC

## 2022-10-26 MED FILL — Heparin Sodium (Porcine) Inj 1000 Unit/ML: INTRAMUSCULAR | Qty: 30 | Status: AC

## 2022-10-26 MED FILL — Electrolyte-R (PH 7.4) Solution: INTRAVENOUS | Qty: 3000 | Status: AC

## 2022-10-26 MED FILL — Mannitol IV Soln 20%: INTRAVENOUS | Qty: 500 | Status: AC

## 2022-10-26 MED FILL — Sodium Chloride IV Soln 0.9%: INTRAVENOUS | Qty: 2000 | Status: AC

## 2022-10-26 NOTE — Progress Notes (Signed)
CARDIAC REHAB PHASE I    Pt ready for discharge home. Reviewed education provided yesterday. All questions and concerns addressed. Referral for CRP2 sent to [facility]   0830-0900  Woodroe Chen, RN BSN 10/26/2022 8:57 AM

## 2022-10-26 NOTE — Progress Notes (Signed)
      [address]       [place] [address]             [phone]      4 Days Post-Op Procedure(s) (LRB): CORONARY ARTERY BYPASS GRAFTING (CABG) TIMES THREE USING THE LEFT INTERNAL MAMMARY ARTERY (LIMA) AND ENDOSCOPICALLY HARVESTED RIGHT GREATER SAPHENOUS VEIN (N/A) TRANSESOPHAGEAL ECHOCARDIOGRAM (N/A)  Subjective:  Overall patient is doing well.  She is ready to go home.  + ambulation + BM  Objective: Vital signs in last 24 hours: Temp:  [97.8 F (36.6 C)-99.4 F (37.4 C)] 97.8 F (36.6 C) (05/10 0408) Pulse Rate:  [68-85] 68 (05/10 0408) Cardiac Rhythm: Normal sinus rhythm (05/09 2000) Resp:  [12-19] 18 (05/10 0408) BP: (106-126)/(62-67) 126/64 (05/10 0408) SpO2:  [95 %] 95 % (05/10 0408) Weight:  [81.7 kg] 81.7 kg (05/10 0408)  Intake/Output from previous day: 05/09 0701 - 05/10 0700 In: 240 [P.O.:240] Out: -   General appearance: alert, cooperative, and no distress Heart: regular rate and rhythm Lungs: clear to auscultation bilaterally Abdomen: soft, non-tender; bowel sounds normal; no masses,  no organomegaly Extremities: edema trace Wound: clean and dry  Lab Results: Recent Labs    10/24/22 0429 10/25/22 0135  WBC 10.5 7.8  HGB 11.4* 9.8*  HCT 34.4* 30.3*  PLT 110* 116*   BMET:  Recent Labs    10/24/22 0429 10/25/22 0135  NA 130* 134*  K 3.8 3.7  CL 96* 102  CO2 25 26  GLUCOSE 133* 132*  BUN 7 9  CREATININE 0.58 0.53  CALCIUM 8.3* 8.1*    PT/INR: No results for input(s): "LABPROT", "INR" in the last 72 hours. ABG    Component Value Date/Time   PHART 7.387 10/23/2022 0416   HCO3 21.8 10/23/2022 0416   TCO2 23 10/23/2022 0416   ACIDBASEDEF 3.0 (H) 10/23/2022 0416   O2SAT 96 10/23/2022 0416   CBG (last 3)  Recent Labs    10/25/22 1654 10/25/22 2135 10/26/22 0607  GLUCAP 120* 121* 135*    Assessment/Plan: S/P Procedure(s) (LRB): CORONARY ARTERY BYPASS GRAFTING (CABG) TIMES THREE USING THE LEFT INTERNAL MAMMARY  ARTERY (LIMA) AND ENDOSCOPICALLY HARVESTED RIGHT GREATER SAPHENOUS VEIN (N/A) TRANSESOPHAGEAL ECHOCARDIOGRAM (N/A)  CV- NSR, BP improved- continue Coreg, will hold off on home Lisinopril for now, can hopefully resume as outpatient Pulm- no acute issues, off oxygen, continue IS Renal- creatinine stable, weight is trending down, continue Lasix, potassium DM- sugars have been well controlled, educated patient on importance of good blood sugar control Dispo- patient stable, will d/c home today   LOS: 7 days    Lowella Dandy, PA-C 10/26/2022

## 2022-10-26 NOTE — Progress Notes (Addendum)
Rounding Note    Patient Name: Lauren [name] Date of Encounter: 10/26/2022  [facility] Cardiologist: Bryan Lemma, MD   Subjective   No pain, no complaints  Inpatient Medications    Scheduled Meds:  acetaminophen  1,000 mg Oral Q6H   Or   acetaminophen (TYLENOL) oral liquid 160 mg/5 mL  1,000 mg Per Tube Q6H   aspirin EC  325 mg Oral Daily   Or   aspirin  324 mg Per Tube Daily   bisacodyl  10 mg Oral Daily   Or   bisacodyl  10 mg Rectal Daily   carvedilol  3.125 mg Oral BID WC   docusate sodium  200 mg Oral Daily   furosemide  40 mg Oral Daily   insulin aspart  0-20 Units Subcutaneous TID WC   pantoprazole  40 mg Oral Daily   potassium chloride  40 mEq Oral Daily   rosuvastatin  40 mg Oral Daily   Continuous Infusions:  PRN Meds: fluticasone, ondansetron (ZOFRAN) IV, oxyCODONE, traMADol   Vital Signs    Vitals:   10/25/22 2314 10/26/22 0408 10/26/22 0820 10/26/22 0823  BP: 107/66 126/64 118/63 118/63  Pulse: 75 68 78 74  Resp: 17 18  15   Temp: 97.8 F (36.6 C) 97.8 F (36.6 C)  99.2 F (37.3 C)  TempSrc: Oral Oral  Oral  SpO2: 95% 95%  95%  Weight:  81.7 kg    Height:        Intake/Output Summary (Last 24 hours) at 10/26/2022 1032 Last data filed at 10/25/2022 1100 Gross per 24 hour  Intake 240 ml  Output --  Net 240 ml      10/26/2022    4:08 AM 10/25/2022    3:59 AM 10/24/2022    5:00 AM  Last 3 Weights  Weight (lbs) 180 lb 3.2 oz 179 lb 9.6 oz 182 lb 5.1 oz  Weight (kg) 81.738 kg 81.466 kg 82.7 kg      Telemetry    SR - Personally Reviewed  ECG    No new - Personally Reviewed  Physical Exam   GEN: No acute distress.   Neck: No JVD Cardiac: RRR, no murmurs, rubs, or gallops. Incision without redness or bleeding  Respiratory: Clear to auscultation bilaterally. GI: Soft, nontender, non-distended  MS: No edema; No deformity. Neuro:  Nonfocal  Psych: Normal affect   Labs    High Sensitivity Troponin:  No results for  input(s): "TROPONINIHS" in the last 720 hours.   Chemistry Recent Labs  Lab 10/23/22 0415 10/23/22 0416 10/23/22 1652 10/24/22 0429 10/25/22 0135  NA 135   < > 134* 130* 134*  K 4.0   < > 4.0 3.8 3.7  CL 106  --  100 96* 102  CO2 22  --  26 25 26   GLUCOSE 111*  --  178* 133* 132*  BUN 6  --  7 7 9   CREATININE 0.51  --  0.55 0.58 0.53  CALCIUM 7.9*  --  8.2* 8.3* 8.1*  MG 2.0  --  1.8 1.7  --   GFRNONAA >60  --  >60 >60 >60  ANIONGAP 7  --  8 9 6    < > = values in this interval not displayed.    Lipids No results for input(s): "CHOL", "TRIG", "HDL", "LABVLDL", "LDLCALC", "CHOLHDL" in the last 168 hours.  Hematology Recent Labs  Lab 10/23/22 1652 10/24/22 0429 10/25/22 0135  WBC 10.3 10.5 7.8  RBC  3.60* 3.62* 3.15*  HGB 11.3* 11.4* 9.8*  HCT 34.2* 34.4* 30.3*  MCV 95.0 95.0 96.2  MCH 31.4 31.5 31.1  MCHC 33.0 33.1 32.3  RDW 13.4 13.4 13.5  PLT 110* 110* 116*   Thyroid No results for input(s): "TSH", "FREET4" in the last 168 hours.  BNPNo results for input(s): "BNP", "PROBNP" in the last 168 hours.  DDimer No results for input(s): "DDIMER" in the last 168 hours.   Radiology    No results found.  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   Mid RCA lesion is 99% stenosed.   RPAV lesion is 90% stenosed.   The left ventricular systolic function is normal.   LV end diastolic pressure is normal.   The left ventricular ejection fraction is 50-55% by visual estimate.   POST-OPERATIVE  DIAGNOSIS:   Low normal EF roughly 50 to 55%.  Difficult to assess wall motion due to ectopy.  Will check 2D echo. Severe multivessel CAD: Distal left main 90% heavily calcified eccentric. Mid LAD eccentric 30%, otherwise the LAD has several small diagonal branches and tapers into a relatively small caliber vessel that reaches the apex. The LCx courses essentially has a lateral OM branch with several small branches that bifurcates distally.  Inferior branch has a 95% stenosis Mid RCA focal eccentric 95% subtotal occlusion; RCA bifurcates into RPA V and PDA There is a 90% stenosis at the RPA V-RPL 1 bifurcation     PLAN OF CARE: Admit to inpatient  -> will initiate low-dose IV nitroglycerin infusion as well as start IV heparin 2 hours after TR band removal based on distal left main disease.  CVTS consulted-will hope for urgent CABG -> will help for Monday, but potentially Saturday or Sunday if she destabilizes. Will titrate GDMT over the weekend. Sliding scale insulin Smoking cessation counseling     Echo 10/19/22   IMPRESSIONS     1. Left ventricular ejection fraction, by estimation, is 55 to 60%. The  left ventricle has normal function. The left ventricle has no regional  wall motion abnormalities. Left ventricular diastolic parameters are  consistent with Grade I diastolic  dysfunction (impaired relaxation). The average left ventricular global  longitudinal strain is -15.6 %. The global longitudinal strain is  abnormal.   2. Right ventricular systolic function is normal. The right ventricular  size is normal.   3. The mitral valve is normal in structure. Trivial mitral valve  regurgitation. No evidence of mitral stenosis.   4. The aortic valve is tricuspid. Aortic valve regurgitation is not  visualized. No aortic stenosis is present.   5. The inferior vena cava is normal in size with greater than 50%  respiratory variability, suggesting right atrial pressure of 3 mmHg.    Comparison(s): No prior Echocardiogram.   FINDINGS   Left Ventricle: Left ventricular ejection fraction, by estimation, is 55  to 60%. The left ventricle has normal function. The left ventricle has no  regional  wall motion abnormalities. The average left ventricular global  longitudinal strain is -15.6 %.  The global longitudinal strain is abnormal. The left ventricular internal  cavity size was normal in size. There is no left ventricular hypertrophy.  Left ventricular diastolic parameters are consistent with Grade I  diastolic dysfunction (impaired  relaxation).   Right Ventricle: The right ventricular size is normal. Right ventricular  systolic function is normal.   Left Atrium: Left atrial size was normal in size.   Right Atrium: Right atrial size was normal in size.   Pericardium: There is no evidence of pericardial effusion.   Mitral Valve: The mitral valve is normal in structure. Trivial mitral  valve regurgitation. No evidence of mitral valve stenosis.   Tricuspid Valve: The tricuspid valve is normal in structure. Tricuspid  valve regurgitation is trivial. No evidence of tricuspid stenosis.   Aortic Valve: The aortic valve is tricuspid. Aortic valve regurgitation is  not visualized. No aortic stenosis is present.   Pulmonic Valve: The pulmonic valve was normal in structure. Pulmonic valve  regurgitation is not visualized. No evidence of pulmonic stenosis.   Aorta: The aortic root is normal in size and structure.   Venous: The inferior vena cava is normal in size with greater than 50%  respiratory variability, suggesting right atrial pressure of 3 mmHg.   IAS/Shunts: No atrial level shunt detected by color flow Doppler.    US Doppler pre-CABG Summary:  Right Carotid: Velocities in the right ICA are consistent with a 1-39%  stenosis.   Left Carotid: Velocities in the left ICA are consistent with a 1-39%  stenosis.  Vertebrals: Bilateral vertebral arteries  demonstrate antegrade flow.  Subclavians: Right subclavian artery was stenotic. Normal flow  hemodynamics were               seen in the left subclavian artery.   Right ABI: Resting right ankle-brachial index is within normal range. The  right toe-brachial index is normal.  Left ABI: Resting left ankle-brachial index is within normal range. The  left toe-brachial index is normal.  Right Upper Extremity: Doppler waveforms decrease <50% with right radial  compression. Doppler waveforms remain within normal limits with right  ulnar compression.  Left Upper Extremity: Doppler waveforms decrease <50% w left radial  compression. Doppler waveforms remain within normal limits with left ulnar  compression.     Intra op TEE 10/22/22 Pericardium: There is no evidence of pericardial effusion.   Mitral Valve: The mitral valve is normal in structure. Mitral valve  regurgitation is not visualized by color flow Doppler. There is No  evidence of mitral stenosis.   Tricuspid Valve: The tricuspid valve was normal in structure. Tricuspid  valve regurgitation was not visualized by color flow Doppler.   Aortic Valve: The aortic valve is tricuspid Aortic valve regurgitation was  not visualized by color flow Doppler. There is no stenosis of the aortic  valve.    Pulmonic Valve: The pulmonic valve was normal in structure.  Pulmonic valve regurgitation is trivial by color flow Doppler.    Aorta: The aortic root, ascending aorta and aortic arch are normal in size  and structure. There is evidence of plaque in the descending aorta; Grade  II, measuring 2-81mm in size.    Patient Profile     47 y.o. female PMH notable for HTN, HLD and DM-2 admitted 10/15/22 with chest pain - cardiac cath with severe disease.   Assessment & Plan    CAD with CABG 10/22/22  X 3 LIMA to LAD, VG to PDA, VG to OM --POD 4 --admitted with progressive angina --ambulated with cardiac Rehab.  --Cr. 0.53 --on ASA 325, Lasix 40  daily, KDur 40 --for discharge today   HTN  --on coreg-3.125 BID-holding lisinopril  hope to resume as outpt   HLD continue statin crestor 40 --Lpa is 93 --recent lipids in March Tchol 140 HDL 35 TG 215 LDL 74   DM per primary --continue farxiga   Post op anemia Hgb 9.8 per primary team       For questions or updates, please contact [facility] Please consult [email or web address] for contact info under        Signed, Nada Boozer, NP  10/26/2022, 10:32 AM    I have examined the patient and reviewed assessment and plan and discussed with patient.  Agree with above as stated.    Aspirin, beta blocker and statin ordereed.  BP controlled.  Patient has done very welll.  We discussed tobacco cessation.  SHe would like to hold off on nicotine patches at this time.  SHe has not smoked for a week. F/u arranged.  Lance Muss

## 2022-10-26 NOTE — TOC Transition Note (Signed)
Transition of Care (TOC) - CM/SW Discharge Note Donn Pierini RN, BSN Transitions of Care Unit 4E- RN Case Manager See Treatment Team for direct phone #   Patient Details  Name: GENEE FIALKOWSKI MRN: [id number] Date of Birth: [birth year]  Transition of Care [facility]) CM/SW Contact:  Darrold Span, RN Phone Number: 10/26/2022, 1:40 PM   Clinical Narrative:    Pt stable for transition home today, DME orders placed for RW and BSC- pt with no preference and in house provider will be used to deliver to room   Call made to [employer] for DME needs- referral accepted- and RW/BSC to be delivered to room prior to discharge.   No further TOC needs noted. Family to transport home   Final next level of care: Home/Self Care Barriers to Discharge: Barriers Resolved   Patient Goals and CMS Choice   Choice offered to / list presented to : NA  Discharge Placement                 Home        Discharge Plan and Services Additional resources added to the After Visit Summary for   In-house Referral: NA Discharge Planning Services: CM Consult Post Acute Care Choice: Home Health          DME Arranged: Bedside commode, Walker rolling DME Agency: [employer] Date DME Agency Contacted: 10/26/22 Time DME Agency Contacted: 0930 Representative spoke with at DME Agency: Barbara Cower HH Arranged: NA HH Agency: NA        Social Determinants of Health (SDOH) Interventions SDOH Screenings   Food Insecurity: Food Insecurity Present (08/31/2022)  Housing: Low Risk  (08/31/2022)  Transportation Needs: No Transportation Needs (08/31/2022)  Utilities: Not At Risk (08/31/2022)  Alcohol Screen: Low Risk  (08/28/2021)  Depression (PHQ2-9): Low Risk  (09/10/2022)  Financial Resource Strain: Medium Risk (08/31/2022)  Physical Activity: Insufficiently Active (08/31/2022)  Social Connections: Socially Isolated (08/31/2022)  Stress: Stress Concern Present (08/31/2022)  Tobacco Use: High Risk (10/23/2022)      Readmission Risk Interventions     No data to display

## 2022-10-26 NOTE — Progress Notes (Signed)
Pt ambulated x 300 feet around unit with front wheel walker

## 2022-10-30 ENCOUNTER — Ambulatory Visit: Payer: 59 | Admitting: Medical

## 2022-11-01 DIAGNOSIS — Z48812 Encounter for surgical aftercare following surgery on the circulatory system: Secondary | ICD-10-CM | POA: Diagnosis not present

## 2022-11-02 ENCOUNTER — Ambulatory Visit (INDEPENDENT_AMBULATORY_CARE_PROVIDER_SITE_OTHER): Payer: Self-pay | Admitting: Thoracic Surgery (Cardiothoracic Vascular Surgery)

## 2022-11-02 DIAGNOSIS — Z951 Presence of aortocoronary bypass graft: Secondary | ICD-10-CM

## 2022-11-02 MED ORDER — TRAMADOL HCL 50 MG PO TABS: 50.0000 mg | ORAL_TABLET | ORAL | 0 refills | Status: DC | PRN

## 2022-11-02 NOTE — Progress Notes (Signed)
     [address]       [place] [address]             [phone]       Patient: Home Provider: Office Consent for Telemedicine visit obtained.  Today's visit was completed via a real-time telehealth (see specific modality noted below). The patient/authorized person provided oral consent at the time of the visit to engage in a telemedicine encounter with the present provider at [facility]. The patient/authorized person was informed of the potential benefits, limitations, and risks of telemedicine. The patient/authorized person expressed understanding that the laws that protect confidentiality also apply to telemedicine. The patient/authorized person acknowledged understanding that telemedicine does not provide emergency services and that he or she would need to call 911 or proceed to the nearest hospital for help if such a need arose.   Total time spent in the clinical discussion 10 minutes.  Telehealth Modality: Phone visit (audio only)  I had a telephone visit with  Lauren [name] who is s/p CABG.  Overall doing well.  Pain is minimal.  Ambulating well. Vitals have been stable.  Lauren [name] will see [place] back in 1 month with a chest x-ray for cardiac rehab clearance.  Lauren [name]

## 2022-11-05 ENCOUNTER — Telehealth: Payer: Self-pay | Admitting: Family Medicine

## 2022-11-05 DIAGNOSIS — Z736 Limitation of activities due to disability: Secondary | ICD-10-CM

## 2022-11-05 NOTE — Telephone Encounter (Signed)
Pt aware will discuss tomorrow during appointment.

## 2022-11-05 NOTE — Telephone Encounter (Signed)
Misty Stanley, a physical therapist with [facility] is calling in because pt recently had open heart surgery and was given a blood sugar reading recently and has some concerns. Per stacey, she gave pt some nutrition advice but pt is still concerned. Misty Stanley wants to know if PCP or the nurse could call pt or set up a time for pt to come in and get additional advice regarding managing blood sugar. Misty Stanley says to follow up with the pt regarding this. Please advise.

## 2022-11-05 NOTE — Telephone Encounter (Signed)
Pt concerned of BS running high after starting Rybelsus, she would like to know if med could be increased to help out. Pt already has an appointment with you tomorrow FYI. Fasting in the morning-150/160 After Eating- 260/270

## 2022-11-06 ENCOUNTER — Ambulatory Visit (INDEPENDENT_AMBULATORY_CARE_PROVIDER_SITE_OTHER): Payer: 59 | Admitting: Family Medicine

## 2022-11-06 ENCOUNTER — Encounter: Payer: Self-pay | Admitting: Family Medicine

## 2022-11-06 VITALS — BP 110/70 | HR 90 | Temp 97.9°F | Resp 16 | Ht 63.0 in | Wt 169.3 lb

## 2022-11-06 DIAGNOSIS — Z5181 Encounter for therapeutic drug level monitoring: Secondary | ICD-10-CM

## 2022-11-06 DIAGNOSIS — Z951 Presence of aortocoronary bypass graft: Secondary | ICD-10-CM

## 2022-11-06 DIAGNOSIS — Z09 Encounter for follow-up examination after completed treatment for conditions other than malignant neoplasm: Secondary | ICD-10-CM

## 2022-11-06 DIAGNOSIS — E1165 Type 2 diabetes mellitus with hyperglycemia: Secondary | ICD-10-CM | POA: Diagnosis not present

## 2022-11-06 DIAGNOSIS — I10 Essential (primary) hypertension: Secondary | ICD-10-CM

## 2022-11-06 DIAGNOSIS — D696 Thrombocytopenia, unspecified: Secondary | ICD-10-CM

## 2022-11-06 DIAGNOSIS — E782 Mixed hyperlipidemia: Secondary | ICD-10-CM

## 2022-11-06 DIAGNOSIS — I2510 Atherosclerotic heart disease of native coronary artery without angina pectoris: Secondary | ICD-10-CM

## 2022-11-06 DIAGNOSIS — Z7984 Long term (current) use of oral hypoglycemic drugs: Secondary | ICD-10-CM

## 2022-11-06 DIAGNOSIS — J449 Chronic obstructive pulmonary disease, unspecified: Secondary | ICD-10-CM | POA: Diagnosis not present

## 2022-11-06 DIAGNOSIS — D62 Acute posthemorrhagic anemia: Secondary | ICD-10-CM

## 2022-11-06 MED ORDER — RYBELSUS 7 MG PO TABS
7.0000 mg | ORAL_TABLET | Freq: Every day | ORAL | 2 refills | Status: DC
Start: 2022-11-06 — End: 2022-12-14

## 2022-11-06 NOTE — Assessment & Plan Note (Signed)
Sx currently well controlled, no recent exacerbation Only using albuterol inhaler prn/rescue

## 2022-11-06 NOTE — Assessment & Plan Note (Signed)
S/p CABG x 3, not currently having exertional CP, she did cardiology f/up and is scheduled for cardiac rehab More aggressive LDL goal - will do f/up on that with next routine ov On ASA, carvedilol, high intensity statin

## 2022-11-06 NOTE — Assessment & Plan Note (Signed)
Uncontrolled for most of the last year She has cut back on sugary drinks/sweets Med changes due to insurance coverage and high A1C Januvia stopped do to insurance On farxiga and rybelsus with high fasting and postprandial sugars, increase rybelsus dose to 7 mg Close f/up on med tolerance and close f/up in office for repeat A1C x 3 months till at goal good med compliance She had no SE when she started rybelsus - previously could not tolerate metformin and unable to tolerate ozempic x 3 months - watch for GI sensitivity, educated pt on MOA and possible SE, encouraged her to eat small frequent meals avoid large portions Blood sugars - high - in am 140-160, after eating 260-270, calls from [facility] are in chart but have not come to use to address (may be going to cardiology team?)  Denies: Polyuria, polydipsia, vision changes, neuropathy, hypoglycemia Recent pertinent labs: Lab Results  Component Value Date   HGBA1C 8.5 (H) 10/21/2022   HGBA1C 10.9 (H) 08/31/2022   HGBA1C 13.1 (A) 11/17/2021   Lab Results  Component Value Date   MICROALBUR 0.3 08/31/2022   LDLCALC 74 08/31/2022   CREATININE 0.53 10/25/2022   Med increase - keep appt next month to discuss meds/tolerance/SE, CBGs Due for in office visit early Aug for repeat A1C

## 2022-11-06 NOTE — Assessment & Plan Note (Signed)
LDL done recently with pt being on crestor 40 mg for about a year, but with new cardiac findings/history LDL not at goal  Lab Results  Component Value Date   CHOL 140 08/31/2022   HDL 35 (L) 08/31/2022   LDLCALC 74 08/31/2022   TRIG 215 (H) 08/31/2022   CHOLHDL 4.0 08/31/2022  LDL goal per Dr. [name] closer to 55 Will recheck with next labs, could add zetia to get to goal  Dr. Erich Montane recent A&P: Last set of cholesterol levels showed LDL at 74-with concern for possible progressive angina and CAD, would shoot for LDL closer to 55. Was increased to 40 mg rosuvastatin by PCP.   Labs to be followed up soon => goal to be updated based on cath findings.   Last A1c was 10.9.  Expected that she may very well end up on insulin.  She is on [place] and Rybelsus both which have cardiac protection.   Discussed importance of dietary modification . Combination of hypertriglyceridemia, DM-2 and HTN but shows metabolic syndrome.

## 2022-11-06 NOTE — Assessment & Plan Note (Signed)
CAGBx3 on 5/6, d/c from hospital on 5/10 recovering well, doing HH, f/up with cardiology and CT surgery will later start cardiac rehab, incisions C, D, I

## 2022-11-06 NOTE — Progress Notes (Signed)
Name: Lauren [name]   MRN: [id number]    DOB: 11-21-1975   Date:11/06/2022       Progress Note  Chief Complaint  Patient presents with   Follow-up   Diabetes     Subjective:   Lauren [name] is a 47 y.o. female, presents to clinic for f/up on high blood sugars/DM  DM:  uncontrolled, was on januvia and farxiga around March 2024 (last routine OV) then started rybelsus and stopped [place] In the past Metformin caused N/V Unable to tolerate ozempic - tried low starting dose x 3 months - constant N/GI upset  Pt managing DM with farxiga 10 rybelsus 3 mg started in march, no SE, tolerating well Reports good med compliance Blood sugars - 140 this am, she called yesterday concerned with fasting sugars being 150-160 and postprandial blood sugars being 260-270 Denies: Polyuria, polydipsia, vision changes, neuropathy, hypoglycemia Recent pertinent labs:  Lab Results  Component Value Date   HGBA1C 8.5 (H) 10/21/2022   HGBA1C 10.9 (H) 08/31/2022   HGBA1C 13.1 (A) 11/17/2021   HGBA1C 7.2 (H) 05/19/2021   HGBA1C 11.2 (A) 02/09/2021   HGBA1C 7.2 (H) 09/28/2020   HGBA1C 7.0 (H) 06/27/2020   HGBA1C 8.0 (H) 03/10/2020    Lab Results  Component Value Date   MICROALBUR 0.3 08/31/2022   LDLCALC 74 08/31/2022   CREATININE 0.53 10/25/2022   Standard of care and health maintenance: Urine Microalbumin:  done, UTD Foot exam:  done DM eye exam:  done 08/2022 ACEI/ARB:  - not currently Statin:  crestor  On crestor 40 mg for over a year - last labs done 2 months ago- with recent NSTEMI/CABG x 3 LDL goal may be lower per specialists Lab Results  Component Value Date   CHOL 140 08/31/2022   HDL 35 (L) 08/31/2022   LDLCALC 74 08/31/2022   TRIG 215 (H) 08/31/2022   CHOLHDL 4.0 08/31/2022   HTN On carvedilol only, BP at goal today Cardiology note prior to hospitalization and procedures had started coreg and decreased lisinopril to 10 mg daily BP Readings from Last 3 Encounters:  11/06/22  110/70  10/26/22 118/63  10/15/22 109/71   S/p CABG x 3 - admitted for urgent CABG after cath showed severe multivessel disease, CABG on 5/6, d/c on 5/10 At D/C pt was on BB, lisinopril stopped due to hypotension  Januvia stopped during hospitalization (?) - however prior to cath she was on januvia, farxiga and rybelsus  Upon further chart review in March pt reported [place] was no longer being covered by insurance and she wanted something different in its place  Patient call stated she went to the pharmacy and the sitaGLIPtin (JANUVIA) 100 MG tablet  was not covered by her insurance anymore. She is requesting provider switch her medication to something else that will work for her [id number] [place], [place] - [address] Phone: [phone]  Fax: [phone]    Please advise patient         Electronically signed by Elon Jester at 09/14/2022  4:14 PM   Current Outpatient Medications:    acetaminophen (TYLENOL) 500 MG tablet, Take 1 tablet (500 mg total) by mouth every 6 (six) hours as needed., Disp: 30 tablet, Rfl: 0   aspirin EC 81 MG tablet, Take 1 tablet (81 mg total) by mouth daily. Swallow whole. (Patient taking differently: Take 81 mg by mouth at bedtime. Swallow whole.), Disp: 90 tablet, Rfl: 3   b complex vitamins capsule, Take  1 capsule by mouth daily., Disp: , Rfl:    carvedilol (COREG) 3.125 MG tablet, Take 1 tablet (3.125 mg total) by mouth 2 (two) times daily., Disp: 180 tablet, Rfl: 3   cetirizine (ZYRTEC) 10 MG tablet, Take 10 mg by mouth daily as needed for allergies., Disp: , Rfl:    dapagliflozin propanediol (FARXIGA) 10 MG TABS tablet, Take 1 tablet (10 mg total) by mouth daily. (Patient taking differently: Take 10 mg by mouth at bedtime.), Disp: 90 tablet, Rfl: 1   fluticasone (FLONASE) 50 MCG/ACT nasal spray, Place 1 spray into both nostrils daily as needed for allergies., Disp: , Rfl:    nitroGLYCERIN (NITROSTAT) 0.3 MG SL tablet, Place 1 tablet (0.3  mg total) under the tongue every 5 (five) minutes as needed for chest pain., Disp: 90 tablet, Rfl: 12   omeprazole (PRILOSEC OTC) 20 MG tablet, Take 20 mg by mouth daily as needed (acid reflux)., Disp: , Rfl:    rosuvastatin (CRESTOR) 40 MG tablet, Take 1 tablet (40 mg total) by mouth daily., Disp: 90 tablet, Rfl: 1   Semaglutide (RYBELSUS) 3 MG TABS, Take 1 tablet (3 mg total) by mouth daily., Disp: 30 tablet, Rfl: 1   traMADol (ULTRAM) 50 MG tablet, Take 1 tablet (50 mg total) by mouth every 4 (four) hours as needed for moderate pain., Disp: 30 tablet, Rfl: 0   valACYclovir (VALTREX) 1000 MG tablet, Take 1 tablet (1,000 mg total) by mouth daily. Take as needed for herpes outbreak. (Patient taking differently: Take 1,000 mg by mouth daily as needed (herpes outbreak.).), Disp: 90 tablet, Rfl: 3   furosemide (LASIX) 40 MG tablet, Take 1 tablet (40 mg total) by mouth daily. (Patient not taking: Reported on 11/06/2022), Disp: 7 tablet, Rfl: 0   potassium chloride SA (KLOR-CON M) 20 MEQ tablet, Take 1 tablet (20 mEq total) by mouth daily. (Patient not taking: Reported on 11/06/2022), Disp: 7 tablet, Rfl: 0  Patient Active Problem List   Diagnosis Date Noted   S/P CABG x 3 10/22/2022   Coronary artery disease involving left main coronary artery 10/19/2022   Chronic obstructive pulmonary disease, unspecified COPD type (HCC) 10/15/2022   Progressive angina (HCC) 10/15/2022   Type 2 diabetes mellitus with obesity (HCC) 08/18/2021   Cervical radiculopathy 08/04/2020   Encounter for screening colonoscopy    Polyp of transverse colon    Polyp of sigmoid colon    Essential hypertension 05/19/2018   History of vitamin D deficiency 05/18/2018   Tobacco dependence 05/18/2018   Morbid obesity (HCC) 01/13/2018   Allergic rhinitis, seasonal 04/23/2017   Genital herpes 04/23/2017   MDD (major depressive disorder) 05/17/2016   Bronchitis 03/16/2016   Cholesteatoma of attic of right ear 12/05/2015   Nocturnal  cough 11/29/2015   GERD (gastroesophageal reflux disease) 11/29/2015   Acid reflux 09/19/2015   Screening for breast cancer 09/19/2015   Chronic pelvic pain in female 09/19/2015   Vitamin D deficiency 12/13/2014   Anxiety and depression 12/13/2014   History of herpes genitalis 12/13/2014   Hyperlipidemia associated with type 2 diabetes mellitus (HCC) 05/26/2014   Smoker 05/26/2014    Past Surgical History:  Procedure Laterality Date   APPENDECTOMY     CESAREAN SECTION     x 4    COLONOSCOPY     COLONOSCOPY WITH PROPOFOL N/A 07/27/2020   Procedure: COLONOSCOPY WITH PROPOFOL;  Surgeon: Pasty Spillers, MD;  Location: [facility] ENDOSCOPY;  Service: Endoscopy;  Laterality: N/A;  COVID POSITIVE 07/05/2020  CORONARY ARTERY BYPASS GRAFT N/A 10/22/2022   Procedure: CORONARY ARTERY BYPASS GRAFTING (CABG) TIMES THREE USING THE LEFT INTERNAL MAMMARY ARTERY (LIMA) AND ENDOSCOPICALLY HARVESTED RIGHT GREATER SAPHENOUS VEIN;  Surgeon: Corliss Skains, MD;  Location: [facility] OR;  Service: Open Heart Surgery;  Laterality: N/A;   HERNIA REPAIR  09/25/2011   LEFT HEART CATH AND CORONARY ANGIOGRAPHY N/A 10/19/2022   Procedure: LEFT HEART CATH AND CORONARY ANGIOGRAPHY;  Surgeon: Marykay Lex, MD;  Location: [facility] INVASIVE CV LAB;  Service: Cardiovascular;  Laterality: N/A;   TEE WITHOUT CARDIOVERSION N/A 10/22/2022   Procedure: TRANSESOPHAGEAL ECHOCARDIOGRAM;  Surgeon: Corliss Skains, MD;  Location: [facility] OR;  Service: Open Heart Surgery;  Laterality: N/A;   TUBAL LIGATION     TYMPANOMASTOIDECTOMY Right 12/07/2015   Procedure: TYMPANOMASTOIDECTOMY;  Surgeon: Geanie Logan, MD;  Location: [facility] ORS;  Service: ENT;  Laterality: Right;    Family History  Problem Relation Age of Onset   Hypertension Mother    Hyperlipidemia Mother    Diabetes Mother    Arrhythmia Father 12       A-fib   Alcohol abuse Father    Breast cancer Maternal Aunt    Heart disease Brother 83       stent x 1    Alcoholism Brother     Asthma Son    Asperger's syndrome Son     Social History   Tobacco Use   Smoking status: Every Day    Packs/day: 1.00    Years: 28.00    Additional pack years: 0.00    Total pack years: 28.00    Types: Cigarettes   Smokeless tobacco: Never  Vaping Use   Vaping Use: Never used  Substance Use Topics   Alcohol use: Not Currently    Alcohol/week: 0.0 standard drinks of alcohol   Drug use: No     Allergies  Allergen Reactions   Cephalexin Other (See Comments)    Secondary yeast infection   Metformin And Related Nausea And Vomiting    With all doses and regular and extended release forms    Health Maintenance  Topic Date Due   COVID-19 Vaccine (3 - 2023-24 season) 11/22/2022 (Originally 02/16/2022)   MAMMOGRAM  12/16/2022   INFLUENZA VACCINE  01/17/2023   HEMOGLOBIN A1C  04/23/2023   PAP SMEAR-Modifier  04/27/2023   OPHTHALMOLOGY EXAM  05/19/2023   Diabetic kidney evaluation - Urine ACR  08/31/2023   FOOT EXAM  09/10/2023   Diabetic kidney evaluation - eGFR measurement  10/25/2023   COLONOSCOPY (Pts 45-70yrs Insurance coverage will need to be confirmed)  07/28/2027   DTaP/Tdap/Td (4 - Td or Tdap) 08/19/2031   Hepatitis C Screening  Completed   HIV Screening  Completed   HPV VACCINES  Aged Out    Chart Review Today: I personally reviewed active problem list, medication list, allergies, family history, social history, health maintenance, notes from last encounter, lab results, imaging with the patient/caregiver today.   Review of Systems  Constitutional: Negative.   HENT: Negative.    Eyes: Negative.   Respiratory: Negative.    Cardiovascular: Negative.   Gastrointestinal: Negative.   Endocrine: Negative.   Genitourinary: Negative.   Musculoskeletal: Negative.   Skin: Negative.   Allergic/Immunologic: Negative.   Neurological: Negative.   Hematological: Negative.   Psychiatric/Behavioral: Negative.    All other systems reviewed and are negative.     Objective:   Vitals:   11/06/22 0818  BP: 110/70  Pulse: 90  Resp: 16  Temp: 97.9 F (36.6 C)  TempSrc: Oral  SpO2: 98%  Weight: 169 lb 4.8 oz (76.8 kg)  Height: 5\' 3"  (1.6 m)    Body mass index is 29.99 kg/m.  Physical Exam Vitals and nursing note reviewed.  Constitutional:      General: She is not in acute distress.    Appearance: Normal appearance. She is well-developed. She is not ill-appearing, toxic-appearing or diaphoretic.  HENT:     Head: Normocephalic and atraumatic.     Right Ear: External ear normal.     Left Ear: External ear normal.     Nose: Nose normal.  Eyes:     General:        Right eye: No discharge.        Left eye: No discharge.     Conjunctiva/sclera: Conjunctivae normal.  Neck:     Trachea: No tracheal deviation.  Cardiovascular:     Rate and Rhythm: Normal rate and regular rhythm.     Heart sounds: Normal heart sounds. No murmur heard.    No friction rub. No gallop.     Comments: Midline sternotomy clean dry intact, scabbing, no erythema Right LE with some incisions intact, clean and dry, diffuse ecchymosis to medial calf area Pulmonary:     Effort: Pulmonary effort is normal. No respiratory distress.     Breath sounds: Normal breath sounds. No stridor. No wheezing, rhonchi or rales.  Musculoskeletal:        General: Normal range of motion.  Skin:    General: Skin is warm and dry.     Findings: No rash.  Neurological:     Mental Status: She is alert. Mental status is at baseline.     Motor: No abnormal muscle tone.     Coordination: Coordination normal.  Psychiatric:        Mood and Affect: Mood normal.        Behavior: Behavior normal. Behavior is cooperative.         Assessment & Plan:   Pt presents for uncontrolled DM with hyperglycemia, also multiple recent health changes, open heart surgery/admission, f/up on all chronic conditions, uncontrolled DM, and did HFU today  Problem List Items Addressed This Visit        Cardiovascular and Mediastinum   Essential hypertension (Chronic)    BP stable, well controlled, currently at goal today Med changes with surgery/hospitalization, now on carvedilol s/p CABG x 3 due to severe multivessel disease after eval for progressive angina Lisinopril was stopped inpt due to hypotension, will keep her off of it for now due to current BP Reevaluate if able to add on low dose in a few months for renal protection BP Readings from Last 3 Encounters:  11/06/22 110/70  10/26/22 118/63  10/15/22 109/71        Relevant Orders   BASIC METABOLIC PANEL WITH GFR   Hemoglobin A1c   Coronary artery disease involving left main coronary artery    S/p CABG x 3, not currently having exertional CP, she did cardiology f/up and is scheduled for cardiac rehab More aggressive LDL goal - will do f/up on that with next routine ov On ASA, carvedilol, high intensity statin        Respiratory   Chronic obstructive pulmonary disease, unspecified COPD type (HCC) (Chronic)    Sx currently well controlled, no recent exacerbation Only using albuterol inhaler prn/rescue        Endocrine   Type 2 diabetes mellitus with hyperglycemia, without  long-term current use of insulin (HCC) - Primary    Uncontrolled for most of the last year She has cut back on sugary drinks/sweets Med changes due to insurance coverage and high A1C Januvia stopped do to insurance On farxiga and rybelsus with high fasting and postprandial sugars, increase rybelsus dose to 7 mg Close f/up on med tolerance and close f/up in office for repeat A1C x 3 months till at goal good med compliance She had no SE when she started rybelsus - previously could not tolerate metformin and unable to tolerate ozempic x 3 months - watch for GI sensitivity, educated pt on MOA and possible SE, encouraged her to eat small frequent meals avoid large portions Blood sugars - high - in am 140-160, after eating 260-270, calls from [facility] are in chart  but have not come to use to address (may be going to cardiology team?)  Denies: Polyuria, polydipsia, vision changes, neuropathy, hypoglycemia Recent pertinent labs: Lab Results  Component Value Date   HGBA1C 8.5 (H) 10/21/2022   HGBA1C 10.9 (H) 08/31/2022   HGBA1C 13.1 (A) 11/17/2021   Lab Results  Component Value Date   MICROALBUR 0.3 08/31/2022   LDLCALC 74 08/31/2022   CREATININE 0.53 10/25/2022   Med increase - keep appt next month to discuss meds/tolerance/SE, CBGs Due for in office visit early Aug for repeat A1C      Relevant Medications   Semaglutide (RYBELSUS) 7 MG TABS   Other Relevant Orders   BASIC METABOLIC PANEL WITH GFR   COMPLETE METABOLIC PANEL WITH GFR   Hemoglobin A1c   Lipid panel     Other   Mixed hyperlipidemia    LDL done recently with pt being on crestor 40 mg for about a year, but with new cardiac findings/history LDL not at goal  Lab Results  Component Value Date   CHOL 140 08/31/2022   HDL 35 (L) 08/31/2022   LDLCALC 74 08/31/2022   TRIG 215 (H) 08/31/2022   CHOLHDL 4.0 08/31/2022  LDL goal per Dr. [name] closer to 65 Will recheck with next labs, could add zetia to get to goal  Dr. Erich Montane recent A&P: Last set of cholesterol levels showed LDL at 74-with concern for possible progressive angina and CAD, would shoot for LDL closer to 55. Was increased to 40 mg rosuvastatin by PCP.   Labs to be followed up soon => goal to be updated based on cath findings.   Last A1c was 10.9.  Expected that she may very well end up on insulin.  She is on [place] and Rybelsus both which have cardiac protection.   Discussed importance of dietary modification . Combination of hypertriglyceridemia, DM-2 and HTN but shows metabolic syndrome.       Relevant Orders   BASIC METABOLIC PANEL WITH GFR   Hemoglobin A1c   Lipid panel   S/P CABG x 3    CAGBx3 on 5/6, d/c from hospital on 5/10 recovering well, doing HH, f/up with cardiology and CT surgery will  later start cardiac rehab, incisions C, D, I       Relevant Orders   BASIC METABOLIC PANEL WITH GFR   CBC with Differential/Platelet   Other Visit Diagnoses     Encounter for examination following treatment at hospital       extensive chart review today from cardiology pre cath, to cath, to surgery, ICU and then discharge and f/up plan, labs, imaging, results all reviewed   Relevant Orders   BASIC METABOLIC PANEL  WITH GFR   CBC with Differential/Platelet   COMPLETE METABOLIC PANEL WITH GFR   Hemoglobin A1c   Lipid panel   CBC with Differential/Platelet   Encounter for medication monitoring       Relevant Orders   BASIC METABOLIC PANEL WITH GFR   CBC with Differential/Platelet   COMPLETE METABOLIC PANEL WITH GFR   Hemoglobin A1c   Lipid panel   Anemia due to acute blood loss       s/p CAGB, recheck H/H, discussed acute blood loss, dilution, slow recovery in 3-6 months with some monitoring at primary - she doesn't want blood draw today   Relevant Orders   CBC with Differential/Platelet   CBC with Differential/Platelet   Thrombocytopenia (HCC)       in hospital in setting of NSTEMI and CABG x 3   Relevant Orders   CBC with Differential/Platelet   CBC with Differential/Platelet       Plan to get some HFU labs in the next month, pt did not want a blood draw today - to check H/H and BMP, last H/H dropped down to 9 from last baseline 10-11, and prior to hospitalization was 14.  Mild electrolyte abnormalities which I expect to be normalized Future labs ordered for next routine OV around Aug 5th Will need to trend CBC, recheck uncontrolled DM, see if LDL is near goal of 55   Return for keep June f/up visit change to virtual for f/up on med changes, sugars.   Danelle Berry, PA-C 11/06/22 8:37 AM

## 2022-11-06 NOTE — Assessment & Plan Note (Signed)
BP stable, well controlled, currently at goal today Med changes with surgery/hospitalization, now on carvedilol s/p CABG x 3 due to severe multivessel disease after eval for progressive angina Lisinopril was stopped inpt due to hypotension, will keep her off of it for now due to current BP Reevaluate if able to add on low dose in a few months for renal protection BP Readings from Last 3 Encounters:  11/06/22 110/70  10/26/22 118/63  10/15/22 109/71

## 2022-11-07 ENCOUNTER — Telehealth: Payer: Self-pay

## 2022-11-07 NOTE — Telephone Encounter (Signed)
FMLA /completed and faxed to [employer] @[phone]./ beginning LOA 10/19/22 through 01/21/23./ DOS 10/22/22 Critical Illness form completed and faxed to [employer] @ [phone]  Forms mailed to home address

## 2022-11-08 ENCOUNTER — Other Ambulatory Visit: Payer: Self-pay | Admitting: Internal Medicine

## 2022-11-08 DIAGNOSIS — Z8619 Personal history of other infectious and parasitic diseases: Secondary | ICD-10-CM

## 2022-11-09 NOTE — Telephone Encounter (Signed)
Requested Prescriptions  Pending Prescriptions Disp Refills   valACYclovir (VALTREX) 1000 MG tablet [Pharmacy Med Name: VALACYCLOVIR HCL 1 GRAM TABLET] 30 tablet 11    Sig: TAKE 1 TABLET (1,000 MG TOTAL) BY MOUTH DAILY. TAKE AS NEEDED FOR HERPES OUTBREAK.     Antimicrobials:  Antiviral Agents - Anti-Herpetic Passed - 11/08/2022  7:35 PM      Passed - Valid encounter within last 12 months    Recent Outpatient Visits           3 days ago Type 2 diabetes mellitus with hyperglycemia, without long-term current use of insulin [facility])   [place] [facility] Danelle Berry, PA-C   2 months ago Type 2 diabetes mellitus with hyperglycemia, without long-term current use of insulin [facility])   [place] [facility] Margarita Mail, DO   2 months ago Annual physical exam   [facility] Margarita Mail, DO   11 months ago Type 2 diabetes mellitus with hyperglycemia, without long-term current use of insulin [facility])   [place] [facility] Margarita Mail, DO   1 year ago Acute otitis externa of left ear, unspecified type   [facility] Danelle Berry, PA-C       Future Appointments             In 4 days Charlsie Quest, NP [facility] [place]   In 1 month Danelle Berry, PA-C [facility]   In 2 months Danelle Berry, PA-C [facility]   In 9 months Margarita Mail, DO [facility]

## 2022-11-13 ENCOUNTER — Other Ambulatory Visit
Admission: RE | Admit: 2022-11-13 | Discharge: 2022-11-13 | Disposition: A | Discharge status: Home / Self Care | Payer: 59 | Source: Ambulatory Visit | Attending: Cardiology | Admitting: Cardiology

## 2022-11-13 ENCOUNTER — Encounter: Payer: Self-pay | Admitting: Cardiology

## 2022-11-13 ENCOUNTER — Ambulatory Visit: Payer: 59 | Attending: Medical | Admitting: Cardiology

## 2022-11-13 VITALS — BP 136/70 | HR 91 | Ht 63.0 in | Wt 166.8 lb

## 2022-11-13 DIAGNOSIS — I10 Essential (primary) hypertension: Secondary | ICD-10-CM

## 2022-11-13 DIAGNOSIS — I2510 Atherosclerotic heart disease of native coronary artery without angina pectoris: Secondary | ICD-10-CM | POA: Diagnosis present

## 2022-11-13 DIAGNOSIS — Z7984 Long term (current) use of oral hypoglycemic drugs: Secondary | ICD-10-CM

## 2022-11-13 DIAGNOSIS — E1169 Type 2 diabetes mellitus with other specified complication: Secondary | ICD-10-CM

## 2022-11-13 DIAGNOSIS — E785 Hyperlipidemia, unspecified: Secondary | ICD-10-CM | POA: Insufficient documentation

## 2022-11-13 DIAGNOSIS — M79604 Pain in right leg: Secondary | ICD-10-CM

## 2022-11-13 DIAGNOSIS — E669 Obesity, unspecified: Secondary | ICD-10-CM | POA: Insufficient documentation

## 2022-11-13 DIAGNOSIS — R600 Localized edema: Secondary | ICD-10-CM

## 2022-11-13 DIAGNOSIS — Z951 Presence of aortocoronary bypass graft: Secondary | ICD-10-CM

## 2022-11-13 DIAGNOSIS — B372 Candidiasis of skin and nail: Secondary | ICD-10-CM

## 2022-11-13 LAB — BASIC METABOLIC PANEL
Anion gap: 7 (ref 5–15)
BUN: 12 mg/dL (ref 6–20)
CO2: 25 mmol/L (ref 22–32)
Calcium: 9 mg/dL (ref 8.9–10.3)
Chloride: 104 mmol/L (ref 98–111)
Creatinine, Ser: 0.56 mg/dL (ref 0.44–1.00)
GFR, Estimated: >60 mL/min (ref >60)
Glucose, Bld: 147 mg/dL — ABNORMAL HIGH (ref 70–99)
Potassium: 4 mmol/L (ref 3.5–5.1)
Sodium: 136 mmol/L (ref 135–145)

## 2022-11-13 LAB — CBC
HCT: 43.1 % (ref 36.0–46.0)
Hemoglobin: 13.8 g/dL (ref 12.0–15.0)
MCH: 29.4 pg (ref 26.0–34.0)
MCHC: 32 g/dL (ref 30.0–36.0)
MCV: 91.7 fL (ref 80.0–100.0)
Platelets: 285 10*3/uL (ref 150–400)
RBC: 4.7 MIL/uL (ref 3.87–5.11)
RDW: 12.9 % (ref 11.5–15.5)
WBC: 7.9 10*3/uL (ref 4.0–10.5)
nRBC: 0 % (ref 0.0–0.2)

## 2022-11-13 MED ORDER — NYSTATIN 100000 UNIT/GM EX POWD: 1.0000 | Freq: Three times a day (TID) | CUTANEOUS | 0 refills | Status: DC

## 2022-11-13 MED ORDER — CARVEDILOL 6.25 MG PO TABS: 6.2500 mg | ORAL_TABLET | Freq: Two times a day (BID) | ORAL | 0 refills | Status: DC

## 2022-11-13 NOTE — Progress Notes (Signed)
Electrolytes and kidney function remain stable, blood counts are stable, continue current medication regimen, and keep all follow up appointments.

## 2022-11-13 NOTE — Progress Notes (Signed)
Cardiology Office Note:   Date:  11/13/2022  ID:  Lauren [name], DOB Nov 16, 1975, MRN [id number]  History of Present Illness:   Lauren [name] is a 47 y.o. female with a past medical history of hypertension, hyperlipidemia, type 2 diabetes, coronary artery disease with left main pulm status post CABG x 3, COPD, morbid obesity, gastroesophageal reflux disease, who is here today for follow-up on her coronary artery disease.  She had originally presented to her primary care provider with complaints of chest pain.  This been occurring off and on for months that she describes the pain is mainly been exertional but progressed to occurring at rest.  She described it as a pressure that radiated down both arms.  Due to the symptoms that she was seen by cardiology who recommended the patient undergo a left heart catheterization.  Left heart catheterization was performed and revealed multivessel CAD with left main involvement.  It was felt coronary artery bypass grafting would be indicated and she was admitted and started on IV heparin and nitro.  Positive family history of CAD noted noted underlying heart disease.  She was admitted to [facility] on 10/18/2020 for and underwent CABG x 3 on 10/22/2022 SVG to PDA, SVG to OM, LIMA to LAD as well as endoscopic harvest of the right greater saphenous vein.  She tolerated the procedure well and was transferred to SICU in stable condition.  The patient was extubated.  Patient's pacing wires removed without difficulty.  She was started on diuretics for volume overload state.  Remains in sinus rhythm.  Patient with removal of chest tubes and arterial lines.  She was stable for transfer to progressive care unit on 10/24/2022.  The continuation of her hospital course was uneventful she was considered stable for discharge on 10/26/2022.  She returns to clinic today with several questions and concerns.  She denies any shortness of breath or chest pain.  She does endorse soreness  from sternal incision.  She also has some redness noted at the bottom of incision from where her bra propped.  She has some pain and discomfort to the saphenous vein harvest site to the right lower extremity that has become swollen and tender to touch.  She also has what appears to be a yeast infection in the folds of her lower abdomen that is red and raw.  She has continued to try to walk several times a day.  She was worked on sleeping in the bed even though she states it is difficult to get comfortable.  States that she has been compliant with her current medication regimen without issues.  ROS: 10 point review of systems has been completed and considered negative with exception of what is listed in the HPI  Studies Reviewed:    EKG: Sinus rhythm with rate of 91, ST and T wave depression noted in inferior lateral leads, no acute change from previously noted studies  TTE 10/19/22 1. Left ventricular ejection fraction, by estimation, is 55 to 60%. The  left ventricle has normal function. The left ventricle has no regional  wall motion abnormalities. Left ventricular diastolic parameters are  consistent with Grade I diastolic  dysfunction (impaired relaxation). The average left ventricular global  longitudinal strain is -15.6 %. The global longitudinal strain is  abnormal.   2. Right ventricular systolic function is normal. The right ventricular  size is normal.   3. The mitral valve is normal in structure. Trivial mitral valve  regurgitation. No evidence of  mitral stenosis.   4. The aortic valve is tricuspid. Aortic valve regurgitation is not  visualized. No aortic stenosis is present.   5. The inferior vena cava is normal in size with greater than 50%  respiratory variability, suggesting right atrial pressure of 3 mmHg.    LHC 10/19/22   Mid LM to Ost LAD lesion is 95% stenosed with 95% stenosed side branch in Ost Cx.   Mid RCA lesion is 99% stenosed.   RPAV lesion is 90% stenosed.   The  left ventricular systolic function is normal.   LV end diastolic pressure is normal.   The left ventricular ejection fraction is 50-55% by visual estimate.   POST-OPERATIVE DIAGNOSIS:   Low normal EF roughly 50 to 55%.  Difficult to assess wall motion due to ectopy.  Will check 2D echo. Severe multivessel CAD: Distal left main 90% heavily calcified eccentric. Mid LAD eccentric 30%, otherwise the LAD has several small diagonal branches and tapers into a relatively small caliber vessel that reaches the apex. The LCx courses essentially has a lateral OM branch with several small branches that bifurcates distally.  Inferior branch has a 95% stenosis Mid RCA focal eccentric 95% subtotal occlusion; RCA bifurcates into RPA V and PDA There is a 90% stenosis at the RPA V-RPL 1 bifurcation     PLAN OF CARE: Admit to inpatient  -> will initiate low-dose IV nitroglycerin infusion as well as start IV heparin 2 hours after TR band removal based on distal left main disease.  CVTS consulted-will hope for urgent CABG -> will help for Monday, but potentially Saturday or Sunday if she destabilizes. Will titrate GDMT over the weekend. Sliding scale insulin Smoking cessation counseling  Risk Assessment/Calculations:              Physical Exam:   VS:  BP 136/70 (BP Location: Left Arm, Patient Position: Sitting, Cuff Size: Normal)   Pulse 91   Ht 5\' 3"  (1.6 m)   Wt 166 lb 12.8 oz (75.7 kg)   SpO2 97%   BMI 29.55 kg/m    Wt Readings from Last 3 Encounters:  11/13/22 166 lb 12.8 oz (75.7 kg)  11/06/22 169 lb 4.8 oz (76.8 kg)  10/26/22 180 lb 3.2 oz (81.7 kg)     GEN: Well nourished, well developed in no acute distress NECK: No JVD; No carotid bruits CARDIAC: RRR, no murmurs, rubs, gallops, midline sternal incision healing well, edges are well-approximated there is scabbing at the end of the incision that is red without drainage, chest tube removal sites have healed well with no scabs and no open area  or drainage noted, saphenous vein harvest sites to the right lower extremity are scabbed, there is a swollen area that is warm and red to the touch 2 inches below harvest site RESPIRATORY:  Clear to auscultation without rales, wheezing or rhonchi  ABDOMEN: Soft, non-tender, non-distended EXTREMITIES:  No edema; No deformity   ASSESSMENT AND PLAN:   Coronary artery disease status post CABG x 3 on 10/22/2022 with LIMA to LAD, SVG to PDA, SVG to OM.  Denies any chest discomfort today.  Still has some occasional tenderness to midsternal incision.  Does have some scabbing to the lower portion of the incision likely caused from rubbing of her bra.  She has done well on her medications and states that she has been compliant.  She is continued on aspirin 81 mg daily, rosuvastatin 40 mg daily.  She has chest x-ray scheduled and  follow-up with CVTS.  Once cleared from CVTS she can start cardiac rehab.  Pain and swelling to the right lower extremity and the saphenous vein harvest site.  Patient stated that the discomfort started a couple of days ago and is just gradually gotten worse.  She states it is tender to touch and throbs.  She has been sent her for an ultrasound of that area.  Recommendations to follow after testing completed.  Hypertension with blood pressure today 136/70.  Blood pressure has improved since discharge from the hospital.  She is continued on carvedilol 6.25 mg twice daily that has been increased today.  She has been sent for a being managed to reevaluate kidney function and on return will reinitiate her lisinopril.  Hyperlipidemia with LP(a) of 93 and LDL in March 1974.  She has been continued on rosuvastatin 40 mg daily.  She will need a repeat lipid and hepatic panel in 3 months after initiating therapy.  Type 2 diabetes she is continued on Farxiga.  This continues to be managed by her PCP.  Yeast infection to the pannus of her stomach.  She has been encouraged to keep this area dry and  we have sent in nystatin powder to apply to that area 3 times daily for 2 weeks.  Disposition patient return to clinic to see MD/APP in 2 weeks to reevaluate symptoms and follow-up on current testing.   Cardiac Rehabilitation Eligibility Assessment          Signed, Isaish Alemu, NP

## 2022-11-13 NOTE — Patient Instructions (Signed)
Medication Instructions:   Your physician has recommended you make the following change in your medication:  Increase Carvedilol 6.25 mg tablet by mouth twice daily.  *Start Nystatin powder- 3 times daily for up to 2 weeks.  *If you need a refill on your cardiac medications before your next appointment, please call your pharmacy*   Lab Work:  Your physician recommends that you return for lab work in: Today  BMP/CBC  - Please go to the [facility]. You will check in at the front desk to the right as you walk into the atrium. Valet Parking is offered if needed. - No appointment needed. You may go any day between 7 am and 6 pm.   If you have labs (blood work) drawn today and your tests are completely normal, you will receive your results only by: MyChart Message (if you have MyChart) OR A paper copy in the mail If you have any lab test that is abnormal or we need to change your treatment, we will call you to review the results.   Testing/Procedures:  Your physician has requested that you have a lower extremity venous duplex. This test is an ultrasound of the veins in the legs or arms. It looks at venous blood flow that carries blood from the heart to the legs or arms. Allow one hour for a Lower Venous exam. Allow thirty minutes for an Upper Venous exam. There are no restrictions or special instructions.     Follow-Up: At [facility], you and your health needs are our priority.  As part of our continuing mission to provide you with exceptional heart care, we have created designated Provider Care Teams.  These Care Teams include your primary Cardiologist (physician) and Advanced Practice Providers (APPs -  Physician Assistants and Nurse Practitioners) who all work together to provide you with the care you need, when you need it.  We recommend signing up for the patient portal called "MyChart".  Sign up information is provided on this After Visit Summary.  MyChart is  used to connect with patients for Virtual Visits (Telemedicine).  Patients are able to view lab/test results, encounter notes, upcoming appointments, etc.  Non-urgent messages can be sent to your provider as well.   To learn more about what you can do with MyChart, go to [email or web address].    Your next appointment:   2 week(s)  Provider:   You may see Bryan Lemma, MD or one of the following Advanced Practice Providers on your designated Care Team:   Nicolasa Ducking, NP Eula Listen, PA-C Cadence Fransico Michael, PA-C Charlsie Quest, NP

## 2022-11-17 DIAGNOSIS — Z419 Encounter for procedure for purposes other than remedying health state, unspecified: Secondary | ICD-10-CM | POA: Diagnosis not present

## 2022-11-21 ENCOUNTER — Ambulatory Visit
Admission: RE | Admit: 2022-11-21 | Discharge: 2022-11-21 | Disposition: A | Discharge status: Home / Self Care | Payer: 59 | Source: Ambulatory Visit | Attending: Cardiology | Admitting: Cardiology

## 2022-11-21 DIAGNOSIS — M79604 Pain in right leg: Secondary | ICD-10-CM

## 2022-11-21 DIAGNOSIS — R600 Localized edema: Secondary | ICD-10-CM

## 2022-11-21 NOTE — Addendum Note (Signed)
Addended by: Bryna Colander on: 11/21/2022 11:39 AM   Modules accepted: Orders

## 2022-11-22 NOTE — Progress Notes (Signed)
No blood clot noted in the right leg. Reassuring study. Likely a fluid collection post operatively and should resolve over time.

## 2022-11-28 ENCOUNTER — Encounter: Payer: Self-pay | Admitting: Cardiology

## 2022-11-28 ENCOUNTER — Ambulatory Visit: Payer: 59 | Attending: Cardiology | Admitting: Cardiology

## 2022-11-28 VITALS — BP 120/72 | HR 87 | Ht 63.0 in | Wt 166.0 lb

## 2022-11-28 DIAGNOSIS — E1169 Type 2 diabetes mellitus with other specified complication: Secondary | ICD-10-CM | POA: Diagnosis not present

## 2022-11-28 DIAGNOSIS — I10 Essential (primary) hypertension: Secondary | ICD-10-CM

## 2022-11-28 DIAGNOSIS — Z951 Presence of aortocoronary bypass graft: Secondary | ICD-10-CM | POA: Diagnosis not present

## 2022-11-28 DIAGNOSIS — B372 Candidiasis of skin and nail: Secondary | ICD-10-CM

## 2022-11-28 DIAGNOSIS — M79604 Pain in right leg: Secondary | ICD-10-CM

## 2022-11-28 DIAGNOSIS — I25110 Atherosclerotic heart disease of native coronary artery with unstable angina pectoris: Secondary | ICD-10-CM | POA: Diagnosis not present

## 2022-11-28 DIAGNOSIS — I200 Unstable angina: Secondary | ICD-10-CM

## 2022-11-28 DIAGNOSIS — I2510 Atherosclerotic heart disease of native coronary artery without angina pectoris: Secondary | ICD-10-CM

## 2022-11-28 DIAGNOSIS — E785 Hyperlipidemia, unspecified: Secondary | ICD-10-CM

## 2022-11-28 DIAGNOSIS — E669 Obesity, unspecified: Secondary | ICD-10-CM

## 2022-11-28 MED ORDER — LISINOPRIL 2.5 MG PO TABS: 2.5000 mg | ORAL_TABLET | Freq: Every day | ORAL | 3 refills | Status: DC

## 2022-11-28 MED ORDER — NITROGLYCERIN 0.3 MG SL SUBL
0.3000 mg | SUBLINGUAL_TABLET | SUBLINGUAL | 12 refills | Status: DC | PRN
Start: 2022-11-28 — End: 2023-10-31

## 2022-11-28 NOTE — Patient Instructions (Signed)
Medication Instructions:  Your physician has recommended you make the following change in your medication:   START - lisinopril (ZESTRIL) 2.5 MG tablet - Take 1 tablet (2.5 mg total) by mouth daily.  *If you need a refill on your cardiac medications before your next appointment, please call your pharmacy*  Lab Work: Your physician recommends that you return for lab work in 2 weeks: [facility] Entrance at [facility] 1st desk on the right to check in (REGISTRATION)  Lab hours: Monday- Friday (7:30 am- 5:30 pm)   If you have labs (blood work) drawn today and your tests are completely normal, you will receive your results only by: MyChart Message (if you have MyChart) OR A paper copy in the mail If you have any lab test that is abnormal or we need to change your treatment, we will call you to review the results.  Testing/Procedures: -None ordered  Follow-Up: At [facility], you and your health needs are our priority.  As part of our continuing mission to provide you with exceptional heart care, we have created designated Provider Care Teams.  These Care Teams include your primary Cardiologist (physician) and Advanced Practice Providers (APPs -  Physician Assistants and Nurse Practitioners) who all work together to provide you with the care you need, when you need it.  Your next appointment:   2 month(s)  Provider:   Bryan Lemma, MD    Other Instructions -None

## 2022-11-28 NOTE — Progress Notes (Signed)
Cardiology Office Note:  .   Date:  11/28/2022  ID:  Lauren [name], DOB July 24, 1975, MRN [id number] PCP: Danelle Berry, PA-C   HeartCare Providers Cardiologist:  Bryan Lemma, MD    History of Present Illness: .   Lauren [name] is a 47 y.o. female with past medical history of hypertension, hyperlipidemia, type 2 diabetes, coronary artery disease status post CABG x 3, COPD, morbid obesity, gastroesophageal reflux disease, who is here today to follow-up on her coronary artery disease.  She was originally seen by her PCP March 2024 with complaints of chest pain and was referred to cardiology and saw Dr. [name].  She noted gradual onset of chest pain described as left parasternal pressure radiating to both arms.  This was happening a few times a week initially with exertion but then progressed at rest as well.  She started noticing when she was walking her dog and thinks while she was watching TV.  She was admitted to [facility] 10/19/2022 and underwent CABG x 3 on 10/22/2022 with SVG-PDA, SVG-OM, LIMA-LAD as well as endoscopic harvest of the right greater saphenous vein.  She tolerated the procedure well was transferred to SICU in stable condition.  Be started on diuretics for volume overload remains in sinus rhythm.  Hospital course was uneventful and she was able to be discharged on 10/26/2022.  She was last seen in clinic on 11/05/2022 with several questions and concerns.  She had some redness tenderness at the palmar incision from her bra and well.  She pain and discomfort from SVG harvest site right lower extremity.  Swollen and tender to touch.  She also had a yeast infection in the folds of her lower abdomen that was read at the time.  She was sent for an ultrasound of the right lower extremity which showed just fluid accumulation that would likely resolve on its own.  She was also started on nystatin powder for yeast infection.  She did start cardiac rehab and was waiting for chest  x-ray that was scheduled at her follow-up with cardiovascular thoracic surgery.  She returns to clinic today overall stating that she is doing well.  She continues to have some discomfort to her right lower extremity.  Previous ultrasound completed that revealed showed hematoma versus seroma.  It has slightly decreased in size and is likely  reabsorbing.  She does have some complaints of restless legs and muscle cramps just on the right. Occasional tingling sensations in her chest around her sternal incision.  Denies chest pain or shortness of breath.  Is been increasing her activity and walking approximately 2 miles per day.  She has her upcoming appointment with CVTS for follow-up chest x-ray on Monday.  Previously she had a yeast infection to her lower abdomen was put on nystatin cream that has resolved.  Denies any hospitalizations or visits to the emergency department  ROS: 10 point review of systems completed and considered negative with exception of what is listed in the HPI  Studies Reviewed: Marland Kitchen    EKG: No new tracings been completed today  RLE U/S 11/21/22 IMPRESSION: 1. No right lower extremity DVT. 2. Elongated complex fluid collection in the right calf likely postop seroma or hematoma.    TTE 10/19/22 1. Left ventricular ejection fraction, by estimation, is 55 to 60%. The  left ventricle has normal function. The left ventricle has no regional  wall motion abnormalities. Left ventricular diastolic parameters are  consistent with Grade I diastolic  dysfunction (impaired relaxation). The average left ventricular global  longitudinal strain is -15.6 %. The global longitudinal strain is  abnormal.   2. Right ventricular systolic function is normal. The right ventricular  size is normal.   3. The mitral valve is normal in structure. Trivial mitral valve  regurgitation. No evidence of mitral stenosis.   4. The aortic valve is tricuspid. Aortic valve regurgitation is not  visualized. No  aortic stenosis is present.   5. The inferior vena cava is normal in size with greater than 50%  respiratory variability, suggesting right atrial pressure of 3 mmHg.      LHC 10/19/22   Mid LM to Ost LAD lesion is 95% stenosed with 95% stenosed side branch in Ost Cx.   Mid RCA lesion is 99% stenosed.   RPAV lesion is 90% stenosed.   The left ventricular systolic function is normal.   LV end diastolic pressure is normal.   The left ventricular ejection fraction is 50-55% by visual estimate.   POST-OPERATIVE DIAGNOSIS:   Low normal EF roughly 50 to 55%.  Difficult to assess wall motion due to ectopy.  Will check 2D echo. Severe multivessel CAD: Distal left main 90% heavily calcified eccentric. Mid LAD eccentric 30%, otherwise the LAD has several small diagonal branches and tapers into a relatively small caliber vessel that reaches the apex. The LCx courses essentially has a lateral OM branch with several small branches that bifurcates distally.  Inferior branch has a 95% stenosis Mid RCA focal eccentric 95% subtotal occlusion; RCA bifurcates into RPA V and PDA There is a 90% stenosis at the RPA V-RPL 1 bifurcation     PLAN OF CARE: Admit to inpatient  -> will initiate low-dose IV nitroglycerin infusion as well as start IV heparin 2 hours after TR band removal based on distal left main disease.  CVTS consulted-will hope for urgent CABG -> will help for Monday, but potentially Saturday or Sunday if she destabilizes. Will titrate GDMT over the weekend. Sliding scale insulin Smoking cessation counseling Risk Assessment/Calculations:             Physical Exam:   VS:  BP 120/72 (BP Location: Left Arm, Patient Position: Sitting, Cuff Size: Normal)   Pulse 87   Ht 5\' 3"  (1.6 m)   Wt 166 lb (75.3 kg)   SpO2 98%   BMI 29.41 kg/m    Wt Readings from Last 3 Encounters:  11/28/22 166 lb (75.3 kg)  11/13/22 166 lb 12.8 oz (75.7 kg)  11/06/22 169 lb 4.8 oz (76.8 kg)    GEN: Well nourished,  well developed in no acute distress NECK: No JVD; No carotid bruits CARDIAC: RRR, no murmurs, rubs, gallops RESPIRATORY:  Clear to auscultation without rales, wheezing or rhonchi  ABDOMEN: Soft, non-tender, non-distended Integumentary: Midline sternal incision healing well, well-approximated edges, small scab noted at the distal end of the incision with no drainage, saphenous vein harvest site to the right lower extremity scabbed, with ultrasound confirms seroma versus hematoma in between the 2 incisions, chest tube removal sites healing well EXTREMITIES:  No edema; No deformity   ASSESSMENT AND PLAN: .   Coronary artery disease status post CABG x 3 on 10/22/2022 with LIMA-LAD, SVG to PDA, SVG-OM.  Patient denies any chest discomfort today.  Has some occasional stinging sensations to the left side of her sternal incision that only lasted for seconds.  She continues to be active and is increased to walking approximately 2 miles a day  as she walks some in the morning and some in the evening.  She is continued on aspirin 81 mg daily, rosuvastatin 40 mg daily.  Has a follow-up appointment with CVTS on Monday. Once she is cleared she can start cardiac rehab.  Hypertension with blood pressure today 120/72.  Blood pressure continues to improve since discharge from the hospital.  She is continued on carvedilol 6.25 mg twice daily.  She has also been restarted on lisinopril 2.5 mg daily for cardiorenal protection of her kidneys with her diabetes.  Prior to surgery she was on 40 mg daily but reduced to 10 mg during her hospitalization due to hypotension lisinopril was discontinued altogether.  Repeat BMP in 2 weeks after reinitiating lisinopril.  Right leg seroma versus hematoma.  Sent for recent ultrasound to rule out DVT.  Size smaller today and less painful.  Will continue to monitor.  She has been advised to make sure she lets the provider who she sees for surgery follow-up follow-up on her leg as  well.  Hyperlipidemia with LP(a) of 93.  Continued on rosuvastatin 40 mg daily.  Will need a lipid and hepatic panel in 3 months.  Type 2 diabetes which she is continued on Farxiga and Rybelsus.  This continues to be managed by her PCP.  Yeast infection that is resolved with the use of nystatin powder.    Cardiac Rehabilitation Eligibility Assessment  The patient is ready to start cardiac rehabilitation pending clearance from the cardiac surgeon.       Dispo: Patient return to clinic to see MD/APP in 2 months or sooner if needed.  Signed, Clarkson Rosselli, NP

## 2022-11-30 ENCOUNTER — Other Ambulatory Visit: Payer: Self-pay | Admitting: Thoracic Surgery (Cardiothoracic Vascular Surgery)

## 2022-11-30 DIAGNOSIS — Z951 Presence of aortocoronary bypass graft: Secondary | ICD-10-CM

## 2022-12-03 ENCOUNTER — Ambulatory Visit
Admission: RE | Admit: 2022-12-03 | Discharge: 2022-12-03 | Disposition: A | Discharge status: Home / Self Care | Payer: 59 | Source: Ambulatory Visit | Attending: Thoracic Surgery (Cardiothoracic Vascular Surgery) | Admitting: Thoracic Surgery (Cardiothoracic Vascular Surgery)

## 2022-12-03 ENCOUNTER — Ambulatory Visit (INDEPENDENT_AMBULATORY_CARE_PROVIDER_SITE_OTHER): Payer: Self-pay | Admitting: Physician Assistant

## 2022-12-03 VITALS — BP 115/77 | HR 101 | Resp 20 | Ht 63.0 in | Wt 165.8 lb

## 2022-12-03 DIAGNOSIS — Z951 Presence of aortocoronary bypass graft: Secondary | ICD-10-CM

## 2022-12-03 NOTE — Progress Notes (Signed)
HPI: Lauren [name] is a 47 year old female with past history notable for hypertension, type 2 diabetes mellitus, gastroesophageal reflux disease, dyslipidemia, anxiety, and depression.  Also has a past history of tobacco abuse.  She was recently diagnosed with severe multivessel coronary artery disease after presenting with angina.  Ejection fraction was 50 to 55%.  She underwent coronary bypass grafting x 3 on 10/22/2022 by Dr. Cliffton Asters with a left internal mammary artery grafted to the left anterior descending coronary artery.  Saphenous vein grafts were placed to the obtuse marginal and posterior descending coronary arteries.  She had an uneventful postoperative course and was discharged on postop day 7.  Since discharge she has been seen by the cardiology service where she reported some swelling and pain in her right lower extremity.  She underwent venous ultrasound of the right lower extremity showing no evidence of DVT.  She did have a seroma versus hematoma measuring 13 x 1.2 x 1.9 cm along the course of the [facility] tunnel. This was apparently improving on a subsequent visit.  She returns to the office today for scheduled follow up.  Since hospital discharge the patient reports she continues to make gradual recovery.  The swelling and discomfort in her right lower extremity along the [facility] tunnel is gradually improving as well.  She denies any shortness of breath or palpitations.  She continues to smoke about 1 pack/day.   Current Outpatient Medications  Medication Sig Dispense Refill   acetaminophen (TYLENOL) 500 MG tablet Take 1 tablet (500 mg total) by mouth every 6 (six) hours as needed. 30 tablet 0   aspirin EC 81 MG tablet Take 1 tablet (81 mg total) by mouth daily. Swallow whole. 90 tablet 3   b complex vitamins capsule Take 1 capsule by mouth daily.     carvedilol (COREG) 6.25 MG tablet Take 1 tablet (6.25 mg total) by mouth 2 (two) times daily. 180 tablet 0   cetirizine (ZYRTEC) 10 MG tablet  Take 10 mg by mouth daily as needed for allergies.     dapagliflozin propanediol (FARXIGA) 10 MG TABS tablet Take 1 tablet (10 mg total) by mouth daily. 90 tablet 1   fluticasone (FLONASE) 50 MCG/ACT nasal spray Place 1 spray into both nostrils daily as needed for allergies.     lisinopril (ZESTRIL) 2.5 MG tablet Take 1 tablet (2.5 mg total) by mouth daily. 90 tablet 3   nitroGLYCERIN (NITROSTAT) 0.3 MG SL tablet Place 1 tablet (0.3 mg total) under the tongue every 5 (five) minutes as needed for chest pain. 90 tablet 12   nystatin (MYCOSTATIN/NYSTOP) powder Apply 1 Application topically 3 (three) times daily. 42 g 0   omeprazole (PRILOSEC OTC) 20 MG tablet Take 20 mg by mouth daily as needed (acid reflux).     rosuvastatin (CRESTOR) 40 MG tablet Take 1 tablet (40 mg total) by mouth daily. 90 tablet 1   Semaglutide (RYBELSUS) 7 MG TABS Take 1 tablet (7 mg total) by mouth daily before breakfast. 30 min before eating or other medications 30 tablet 2   traMADol (ULTRAM) 50 MG tablet Take 1 tablet (50 mg total) by mouth every 4 (four) hours as needed for moderate pain. 30 tablet 0   valACYclovir (VALTREX) 1000 MG tablet TAKE 1 TABLET (1,000 MG TOTAL) BY MOUTH DAILY. TAKE AS NEEDED FOR HERPES OUTBREAK. 30 tablet 11   No current facility-administered medications for this visit.    Physical Exam Vital signs BP 115/77 Heart rate 101 Respirations 20 SpO2 97%  on room air   General: Walking without difficulty, no distress Heart: RRR, no murmur Chest: breath sounds are clear, the sternotomy incision is healing with no sign of complication.  Sternum is stable. Extremities: No peripheral edema.  There is some induration along the [facility] tunnel just below the incision in the right lower extremity.  This is minimally tender.  There is no inflammation and no fluctuance.  (Ms. Delconte assures me that this is improving)    Diagnostic Tests:  CLINICAL DATA:  Status post CABG on May 6th.   EXAM: CHEST - 2  VIEW   COMPARISON:  Chest radiograph 10/24/2022.   FINDINGS: Median sternotomy wires and mediastinal surgical clips are again noted. The right IJ vascular sheath seen on the prior study has been removed.   The cardiomediastinal silhouette is now within normal limits   Aeration of the lungs has improved with resolved bibasilar atelectasis. There is no focal consolidation or pulmonary edema. There is no pleural effusion or pneumothorax   There is no acute osseous abnormality.   IMPRESSION: Improved aeration since 10/24/2022 with no radiographic evidence of acute cardiopulmonary process.  Impression / Plan: [name] appears to be making a progressive and satisfactory recovery following CABG x 3.  I think she probably developed a hematoma in the [facility] tunnel just below the knee on the right side.  This appears to be resolving without any signs of infection. Medications were reviewed and require no changes from CT surgery standpoint.  She has scheduled follow-up with cardiology. I encouraged her again to stop smoking. Will make referral for cardiac rehab today. She may return to work on January 21, 2023 which is 3 months post surgery.  She is asked to continue to observe sternal precautions until then. No further follow-up is scheduled in our office but I offered to see her at any time should any problems arise related to her surgery.  Leary Roca, PA-C [facility] [phone]

## 2022-12-03 NOTE — Patient Instructions (Signed)
You are strongly encouraged to stop smoking.  Continue to observe sternal precautions with no lifting greater than 10 lbs until you are three months post surgery.   You may return to work on August 5th, 2024.  Follow up as needed

## 2022-12-04 ENCOUNTER — Other Ambulatory Visit: Payer: Self-pay

## 2022-12-04 ENCOUNTER — Other Ambulatory Visit: Payer: Self-pay | Admitting: *Deleted

## 2022-12-04 NOTE — Progress Notes (Signed)
Referral placed for outpatient Cardiac Rehabilitation at [facility] per Dr. Cliffton Asters.

## 2022-12-10 ENCOUNTER — Telehealth: Payer: Self-pay | Admitting: Cardiology

## 2022-12-10 NOTE — Telephone Encounter (Signed)
Spoke to the pt, advised her will forward message to the MD and nurse. Pt voiced understanding. [employer] short term disability has faxed over a form for when pt can return back to work. Pt stated she had a Triple Bypass done and Dr. Cliffton Asters stated pt can return back to work on 01/21/2023. Pt stated she's not sure why they'd need this office to fill out the form but she stated to call  her if any questions and just wanted to make office aware.

## 2022-12-10 NOTE — Telephone Encounter (Signed)
[employer] has faxed over a form for when pt can return back to work. Pt stated she had a Triple Bypass done and Dr. Cliffton Asters stated pt can return back to work on 01/21/2023. Pt stated she's not sure why they'd need this office to fill out the form but she stated to call  her if any questions and just wanted to make office aware. Please advise

## 2022-12-12 NOTE — Telephone Encounter (Signed)
Recommend that since Dr. Cliffton Asters is the one deciding when to return to work, that this request goes to his office.  DH

## 2022-12-13 NOTE — Progress Notes (Signed)
Name: Lauren [name]   MRN: [id number]    DOB: 04-23-1976   Date:12/14/2022       Progress Note  Subjective  Chief Complaint  Follow Up  HPI  DMII: she is taking Farxiga and Rybelsus 7 mg , she denies side effects of medication. Last A1C was very high - during hospital stay - at 10.9 % . She denies polyphagia, polydipsia or polyuria . She has dyslipidemia and HTN.   Dyslipidemia family history of CAD and personal history of CAD with angina, status post CABG 10/22/2022. She is doing well, compliant, no longer having chest pain. She needs labs done today Goal LDL is at least below 70.   Recurrent depression mild at this time: she was diagnosed around age 47 yo,  she used to see a psychiatrist years ago. She has tried alprazolam, temazepam, citalopram , duloxetine and buspar ( per Epic records) . She stopped all medications when she started to smoke weed , but since CABG cardiologist asked her to not smoke marijuana and she has been anxious and feeling depressed again. She would like to resume medication. She states Duloxetine worked well in the past. She is unable to stay asleep. Denies suicidal thoughts or ideation    Patient Active Problem List   Diagnosis Date Noted   S/P CABG x 3 10/22/2022   Coronary artery disease involving left main coronary artery 10/19/2022   Chronic obstructive pulmonary disease, unspecified COPD type (HCC) 10/15/2022   Progressive angina (HCC) 10/15/2022   Type 2 diabetes mellitus with hyperglycemia, without long-term current use of insulin (HCC) 08/18/2021   Polyp of transverse colon    Polyp of sigmoid colon    Essential hypertension 05/19/2018   Tobacco dependence 05/18/2018   Allergic rhinitis, seasonal 04/23/2017   Genital herpes 04/23/2017   MDD (major depressive disorder) 05/17/2016   Cholesteatoma of attic of right ear 12/05/2015   GERD (gastroesophageal reflux disease) 11/29/2015   Vitamin D deficiency 12/13/2014   Anxiety and depression 12/13/2014    Mixed hyperlipidemia 05/26/2014   Smoker 05/26/2014    Past Surgical History:  Procedure Laterality Date   APPENDECTOMY     CESAREAN SECTION     x 4    COLONOSCOPY     COLONOSCOPY WITH PROPOFOL N/A 07/27/2020   Procedure: COLONOSCOPY WITH PROPOFOL;  Surgeon: Pasty Spillers, MD;  Location: [facility] ENDOSCOPY;  Service: Endoscopy;  Laterality: N/A;  COVID POSITIVE 07/05/2020   CORONARY ARTERY BYPASS GRAFT N/A 10/22/2022   Procedure: CORONARY ARTERY BYPASS GRAFTING (CABG) TIMES THREE USING THE LEFT INTERNAL MAMMARY ARTERY (LIMA) AND ENDOSCOPICALLY HARVESTED RIGHT GREATER SAPHENOUS VEIN;  Surgeon: Corliss Skains, MD;  Location: [facility] OR;  Service: Open Heart Surgery;  Laterality: N/A;   HERNIA REPAIR  09/25/2011   LEFT HEART CATH AND CORONARY ANGIOGRAPHY N/A 10/19/2022   Procedure: LEFT HEART CATH AND CORONARY ANGIOGRAPHY;  Surgeon: Marykay Lex, MD;  Location: [facility] INVASIVE CV LAB;  Service: Cardiovascular;  Laterality: N/A;   TEE WITHOUT CARDIOVERSION N/A 10/22/2022   Procedure: TRANSESOPHAGEAL ECHOCARDIOGRAM;  Surgeon: Corliss Skains, MD;  Location: [facility] OR;  Service: Open Heart Surgery;  Laterality: N/A;   TUBAL LIGATION     TYMPANOMASTOIDECTOMY Right 12/07/2015   Procedure: TYMPANOMASTOIDECTOMY;  Surgeon: Geanie Logan, MD;  Location: [facility] ORS;  Service: ENT;  Laterality: Right;    Family History  Problem Relation Age of Onset   Hypertension Mother    Hyperlipidemia Mother    Diabetes Mother    Arrhythmia  Father 59       A-fib   Alcohol abuse Father    Breast cancer Maternal Aunt    Heart disease Brother 107       stent x 1    Alcoholism Brother    Asthma Son    Asperger's syndrome Son     Social History   Tobacco Use   Smoking status: Some Days    Packs/day: 1.00    Years: 28.00    Additional pack years: 0.00    Total pack years: 28.00    Types: Cigarettes   Smokeless tobacco: Never  Substance Use Topics   Alcohol use: Not Currently    Alcohol/week: 0.0  standard drinks of alcohol     Current Outpatient Medications:    acetaminophen (TYLENOL) 500 MG tablet, Take 1 tablet (500 mg total) by mouth every 6 (six) hours as needed., Disp: 30 tablet, Rfl: 0   aspirin EC 81 MG tablet, Take 1 tablet (81 mg total) by mouth daily. Swallow whole., Disp: 90 tablet, Rfl: 3   b complex vitamins capsule, Take 1 capsule by mouth daily., Disp: , Rfl:    carvedilol (COREG) 6.25 MG tablet, Take 1 tablet (6.25 mg total) by mouth 2 (two) times daily., Disp: 180 tablet, Rfl: 0   cetirizine (ZYRTEC) 10 MG tablet, Take 10 mg by mouth daily as needed for allergies., Disp: , Rfl:    dapagliflozin propanediol (FARXIGA) 10 MG TABS tablet, Take 1 tablet (10 mg total) by mouth daily., Disp: 90 tablet, Rfl: 1   fluticasone (FLONASE) 50 MCG/ACT nasal spray, Place 1 spray into both nostrils daily as needed for allergies., Disp: , Rfl:    lisinopril (ZESTRIL) 2.5 MG tablet, Take 1 tablet (2.5 mg total) by mouth daily., Disp: 90 tablet, Rfl: 3   nitroGLYCERIN (NITROSTAT) 0.3 MG SL tablet, Place 1 tablet (0.3 mg total) under the tongue every 5 (five) minutes as needed for chest pain., Disp: 90 tablet, Rfl: 12   rosuvastatin (CRESTOR) 40 MG tablet, Take 1 tablet (40 mg total) by mouth daily., Disp: 90 tablet, Rfl: 1   valACYclovir (VALTREX) 1000 MG tablet, TAKE 1 TABLET (1,000 MG TOTAL) BY MOUTH DAILY. TAKE AS NEEDED FOR HERPES OUTBREAK., Disp: 30 tablet, Rfl: 11  Allergies  Allergen Reactions   Cephalexin Other (See Comments)    Secondary yeast infection   Metformin And Related Nausea And Vomiting    With all doses and regular and extended release forms    I personally reviewed active problem list, medication list, allergies, family history, social history, health maintenance with the patient/caregiver today.   ROS  Ten systems reviewed and is negative except as mentioned in HPI   Objective  Vitals:   12/14/22 1421  BP: 102/66  Pulse: 96  Resp: 16  Temp: 98.2 F  (36.8 C)  TempSrc: Oral  SpO2: 96%  Weight: 165 lb 12.8 oz (75.2 kg)  Height: 5\' 3"  (1.6 m)    Body mass index is 29.37 kg/m.  Physical Exam  Constitutional: Patient appears well-developed and well-nourished. No distress.  HEENT: head atraumatic, normocephalic, pupils equal and reactive to light, neck supple Cardiovascular: Normal rate, regular rhythm and normal heart sounds.  No murmur heard. No BLE edema. Pulmonary/Chest: Effort normal and breath sounds normal. No respiratory distress. Abdominal: Soft.  There is no tenderness. Psychiatric: Patient has a normal mood and affect. behavior is normal. Judgment and thought content normal.    PHQ2/9:    12/14/2022  2:26 PM 11/06/2022    8:17 AM 09/10/2022    3:26 PM 08/31/2022    2:04 PM 11/17/2021    9:41 AM  Depression screen PHQ 2/9  Decreased Interest 1 1 0 1 1  Down, Depressed, Hopeless 1 1 0 2 1  PHQ - 2 Score 2 2 0 3 2  Altered sleeping 0 0 0 0 1  Tired, decreased energy 0 0 0 0 1  Change in appetite 0 0 0 0 1  Feeling bad or failure about yourself  1 0 0 0 1  Trouble concentrating 0 0 0 0 0  Moving slowly or fidgety/restless 0 0 0 0 1  Suicidal thoughts 0 0 0 0   PHQ-9 Score 3 2 0 3 7  Difficult doing work/chores Somewhat difficult Somewhat difficult Not difficult at all Not difficult at all Somewhat difficult    phq 9 is positive   Fall Risk:    12/14/2022    2:20 PM 11/06/2022    8:17 AM 09/10/2022    3:24 PM 08/31/2022    2:02 PM 11/17/2021    9:37 AM  Fall Risk   Falls in the past year? 1 1 0 0 1  Number falls in past yr: 0 0 0 0 0  Injury with Fall? 1 1 0 0 0  Risk for fall due to : Impaired balance/gait Impaired balance/gait     Follow up Falls prevention discussed;Education provided;Falls evaluation completed Falls prevention discussed;Education provided;Falls evaluation completed         Functional Status Survey: Is the patient deaf or have difficulty hearing?: No Does the patient have difficulty  seeing, even when wearing glasses/contacts?: No Does the patient have difficulty concentrating, remembering, or making decisions?: No Does the patient have difficulty walking or climbing stairs?: No Does the patient have difficulty dressing or bathing?: No Does the patient have difficulty doing errands alone such as visiting a doctor's office or shopping?: No    Assessment & Plan  1. Dyslipidemia associated with type 2 diabetes mellitus (HCC)  - Semaglutide (RYBELSUS) 14 MG TABS; Take 1 tablet (14 mg total) by mouth daily.  Dispense: 90 tablet; Refill: 0  2. S/P CABG x 3  Doing well since surgery   3. Family history of heart disease  Father died at 37 yo, brother had a stent placed at 40   4. Essential hypertension  Bp is at goal   5. Tobacco use disorder  She is trying to quit smoking  6. Major depression, recurrent, chronic (HCC)  - DULoxetine (CYMBALTA) 30 MG capsule; Take 1-2 capsules (30-60 mg total) by mouth daily.  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  Dispense: 30 tablet; Refill: 0

## 2022-12-14 ENCOUNTER — Encounter: Payer: Self-pay | Admitting: Family Medicine

## 2022-12-14 ENCOUNTER — Telehealth: Payer: Managed Care, Other (non HMO) | Admitting: Family Medicine

## 2022-12-14 ENCOUNTER — Ambulatory Visit (INDEPENDENT_AMBULATORY_CARE_PROVIDER_SITE_OTHER): Payer: 59 | Admitting: Family Medicine

## 2022-12-14 ENCOUNTER — Other Ambulatory Visit: Payer: Self-pay | Admitting: Family Medicine

## 2022-12-14 VITALS — BP 102/66 | HR 96 | Temp 98.2°F | Resp 16 | Ht 63.0 in | Wt 165.8 lb

## 2022-12-14 DIAGNOSIS — F5105 Insomnia due to other mental disorder: Secondary | ICD-10-CM

## 2022-12-14 DIAGNOSIS — Z8249 Family history of ischemic heart disease and other diseases of the circulatory system: Secondary | ICD-10-CM | POA: Diagnosis not present

## 2022-12-14 DIAGNOSIS — E1169 Type 2 diabetes mellitus with other specified complication: Secondary | ICD-10-CM

## 2022-12-14 DIAGNOSIS — I10 Essential (primary) hypertension: Secondary | ICD-10-CM

## 2022-12-14 DIAGNOSIS — F339 Major depressive disorder, recurrent, unspecified: Secondary | ICD-10-CM

## 2022-12-14 DIAGNOSIS — F17200 Nicotine dependence, unspecified, uncomplicated: Secondary | ICD-10-CM

## 2022-12-14 DIAGNOSIS — Z951 Presence of aortocoronary bypass graft: Secondary | ICD-10-CM

## 2022-12-14 DIAGNOSIS — Z7984 Long term (current) use of oral hypoglycemic drugs: Secondary | ICD-10-CM

## 2022-12-14 DIAGNOSIS — E785 Hyperlipidemia, unspecified: Secondary | ICD-10-CM

## 2022-12-14 LAB — CBC WITH DIFFERENTIAL/PLATELET
Absolute Monocytes: 413 cells/uL (ref 200–950)
Basophils Absolute: 62 cells/uL (ref 0–200)
Eosinophils Absolute: 117 cells/uL (ref 15–500)
HCT: 44.6 % (ref 35.0–45.0)
Hemoglobin: 14.5 g/dL (ref 11.7–15.5)
Monocytes Relative: 5.3 %
RBC: 5.01 10*6/uL (ref 3.80–5.10)
WBC: 7.8 10*3/uL (ref 3.8–10.8)

## 2022-12-14 MED ORDER — TRAZODONE HCL 50 MG PO TABS
25.0000 mg | ORAL_TABLET | Freq: Every evening | ORAL | 0 refills | Status: DC | PRN
Start: 2022-12-14 — End: 2023-01-09

## 2022-12-14 MED ORDER — RYBELSUS 14 MG PO TABS
14.0000 mg | ORAL_TABLET | Freq: Every day | ORAL | 0 refills | Status: DC
Start: 2022-12-14 — End: 2023-01-11

## 2022-12-14 MED ORDER — DULOXETINE HCL 30 MG PO CPEP
30.0000 mg | ORAL_CAPSULE | Freq: Every day | ORAL | 0 refills | Status: DC
Start: 2022-12-14 — End: 2023-01-09

## 2022-12-14 NOTE — Telephone Encounter (Signed)
Pt was returning LPN call but she stated someone will give the pt a callback. Please advise

## 2022-12-14 NOTE — Telephone Encounter (Signed)
Left message for the pt to call the clinic

## 2022-12-14 NOTE — Telephone Encounter (Signed)
Gave Information to patient.  She simply states "OK"

## 2022-12-15 LAB — COMPLETE METABOLIC PANEL WITH GFR
AG Ratio: 1.7 (calc) (ref 1.0–2.5)
ALT: 29 U/L (ref 6–29)
AST: 25 U/L (ref 10–35)
Albumin: 4.3 g/dL (ref 3.6–5.1)
Alkaline phosphatase (APISO): 64 U/L (ref 31–125)
BUN: 10 mg/dL (ref 7–25)
CO2: 23 mmol/L (ref 20–32)
Calcium: 9.3 mg/dL (ref 8.6–10.2)
Chloride: 108 mmol/L (ref 98–110)
Creat: 0.52 mg/dL (ref 0.50–0.99)
Globulin: 2.5 g/dL (calc) (ref 1.9–3.7)
Glucose, Bld: 94 mg/dL (ref 65–99)
Potassium: 4.2 mmol/L (ref 3.5–5.3)
Sodium: 140 mmol/L (ref 135–146)
Total Bilirubin: 0.4 mg/dL (ref 0.2–1.2)
Total Protein: 6.8 g/dL (ref 6.1–8.1)
eGFR: 115 mL/min/{1.73_m2} (ref >=60)

## 2022-12-15 LAB — CBC WITH DIFFERENTIAL/PLATELET
Basophils Relative: 0.8 %
Eosinophils Relative: 1.5 %
Lymphs Abs: 3409 cells/uL (ref 850–3900)
MCH: 28.9 pg (ref 27.0–33.0)
MCHC: 32.5 g/dL (ref 32.0–36.0)
MCV: 89 fL (ref 80.0–100.0)
MPV: 11.4 fL (ref 7.5–12.5)
Neutro Abs: 3799 cells/uL (ref 1500–7800)
Neutrophils Relative %: 48.7 %
Platelets: 211 10*3/uL (ref 140–400)
RDW: 13.3 % (ref 11.0–15.0)
Total Lymphocyte: 43.7 %

## 2022-12-15 LAB — LIPID PANEL
Cholesterol: 107 mg/dL (ref <200)
HDL: 40 mg/dL — ABNORMAL LOW (ref >=50)
LDL Cholesterol (Calc): 46 mg/dL (calc)
Non-HDL Cholesterol (Calc): 67 mg/dL (calc) (ref <130)
Total CHOL/HDL Ratio: 2.7 (calc) (ref <5.0)
Triglycerides: 119 mg/dL (ref <150)

## 2022-12-15 LAB — HEMOGLOBIN A1C
Hgb A1c MFr Bld: 6.8 % of total Hgb — ABNORMAL HIGH (ref <5.7)
Mean Plasma Glucose: 148 mg/dL
eAG (mmol/L): 8.2 mmol/L

## 2022-12-17 DIAGNOSIS — Z419 Encounter for procedure for purposes other than remedying health state, unspecified: Secondary | ICD-10-CM | POA: Diagnosis not present

## 2022-12-21 ENCOUNTER — Other Ambulatory Visit: Payer: Self-pay | Admitting: Family Medicine

## 2022-12-21 DIAGNOSIS — E782 Mixed hyperlipidemia: Secondary | ICD-10-CM

## 2022-12-21 DIAGNOSIS — E1165 Type 2 diabetes mellitus with hyperglycemia: Secondary | ICD-10-CM

## 2022-12-21 MED ORDER — ROSUVASTATIN CALCIUM 40 MG PO TABS: 40.0000 mg | ORAL_TABLET | Freq: Every day | ORAL | 1 refills | Status: DC

## 2022-12-21 MED ORDER — DAPAGLIFLOZIN PROPANEDIOL 10 MG PO TABS: 10.0000 mg | ORAL_TABLET | Freq: Every day | ORAL | 1 refills | Status: DC

## 2022-12-25 NOTE — Telephone Encounter (Signed)
Late Entry: On 12/14/22, per Dr. [name], [facility] Attending Physician's statement faxed to Dr. Cliffton Asters at [id number]) [phone].  Fax confirmation received.   Will have the front desk scan a copy of the document into the patient's chart of the blank paperwork for future reference if needed.

## 2023-01-05 ENCOUNTER — Other Ambulatory Visit: Payer: Self-pay | Admitting: Family Medicine

## 2023-01-05 DIAGNOSIS — F339 Major depressive disorder, recurrent, unspecified: Secondary | ICD-10-CM

## 2023-01-05 DIAGNOSIS — F5105 Insomnia due to other mental disorder: Secondary | ICD-10-CM

## 2023-01-07 ENCOUNTER — Encounter: Payer: 59 | Attending: Cardiology | Admitting: *Deleted

## 2023-01-07 ENCOUNTER — Encounter: Payer: Self-pay | Admitting: *Deleted

## 2023-01-07 DIAGNOSIS — Z951 Presence of aortocoronary bypass graft: Secondary | ICD-10-CM

## 2023-01-07 NOTE — Progress Notes (Signed)
Virtual orientation call completed today. shehas an appointment on Date: 01/31/2023  for EP eval and gym Orientation.  Documentation of diagnosis can be found in [facility] 10/19/2022 .  Smoking cessation instruction/counseling given:  counseled patient on the dangers of tobacco use, advised patient to stop smoking, and reviewed strategies to maximize success. Naydeen is a current tobacco user. Intervention for tobacco cessation was provided at the initial medical review. She was asked about readiness to quit and reported that she is weaning slowly to a Quit date of 06/12/2023 . Patient was advised and educated about tobacco cessation using combination therapy, tobacco cessation classes, quit line, and quit smoking apps. Patient demonstrated understanding of this material. Staff will continue to provide encouragement and follow up with the patient throughout the program.

## 2023-01-11 ENCOUNTER — Ambulatory Visit: Payer: 59 | Admitting: Family Medicine

## 2023-01-11 ENCOUNTER — Encounter: Payer: Self-pay | Admitting: Family Medicine

## 2023-01-11 VITALS — BP 124/68 | HR 98 | Temp 98.3°F | Resp 18 | Ht 63.0 in | Wt 161.1 lb

## 2023-01-11 DIAGNOSIS — F339 Major depressive disorder, recurrent, unspecified: Secondary | ICD-10-CM

## 2023-01-11 DIAGNOSIS — E1165 Type 2 diabetes mellitus with hyperglycemia: Secondary | ICD-10-CM

## 2023-01-11 DIAGNOSIS — Z1231 Encounter for screening mammogram for malignant neoplasm of breast: Secondary | ICD-10-CM

## 2023-01-11 DIAGNOSIS — E1169 Type 2 diabetes mellitus with other specified complication: Secondary | ICD-10-CM

## 2023-01-11 DIAGNOSIS — F5105 Insomnia due to other mental disorder: Secondary | ICD-10-CM | POA: Diagnosis not present

## 2023-01-11 DIAGNOSIS — E785 Hyperlipidemia, unspecified: Secondary | ICD-10-CM

## 2023-01-11 DIAGNOSIS — Z7984 Long term (current) use of oral hypoglycemic drugs: Secondary | ICD-10-CM

## 2023-01-11 MED ORDER — RYBELSUS 7 MG PO TABS
7.0000 mg | ORAL_TABLET | Freq: Every day | ORAL | 1 refills | Status: DC
Start: 2023-01-11 — End: 2023-10-01

## 2023-01-11 MED ORDER — TRAZODONE HCL 100 MG PO TABS
100.0000 mg | ORAL_TABLET | Freq: Every day | ORAL | 1 refills | Status: DC
Start: 2023-01-11 — End: 2023-06-17

## 2023-01-11 MED ORDER — DULOXETINE HCL 60 MG PO CPEP
60.0000 mg | ORAL_CAPSULE | Freq: Two times a day (BID) | ORAL | 1 refills | Status: DC
Start: 2023-01-11 — End: 2023-06-17

## 2023-01-11 NOTE — Progress Notes (Signed)
Patient ID: Lauren [name], female    DOB: 04/22/1976, 47 y.o.   MRN: [id number]  PCP: Danelle Berry, PA-C  Chief Complaint  Patient presents with   Follow-up   Depression   Insomnia    Subjective:   Lauren [name] is a 47 y.o. female, presents to clinic with CC of the following:  HPI   F/up on med changed done with Dr. Carlynn Purl  Increased cymbalta dose - seems to be working taking 60 mg BID    01/11/2023    8:59 AM 12/14/2022    2:26 PM 11/06/2022    8:17 AM  Depression screen PHQ 2/9  Decreased Interest 1 1 1   Down, Depressed, Hopeless 0 1 1  PHQ - 2 Score 1 2 2   Altered sleeping 1 0 0  Tired, decreased energy 1 0 0  Change in appetite 0 0 0  Feeling bad or failure about yourself  1 1 0  Trouble concentrating 0 0 0  Moving slowly or fidgety/restless 0 0 0  Suicidal thoughts 0 0 0  PHQ-9 Score 4 3 2   Difficult doing work/chores Not difficult at all Somewhat difficult Somewhat difficult      01/11/2023    9:02 AM 12/14/2022    2:26 PM 11/06/2022    8:25 AM 11/17/2021    9:43 AM  GAD 7 : Generalized Anxiety Score  Nervous, Anxious, on Edge 1 3 0 1  Control/stop worrying 1 3 0 2  Worry too much - different things 1 3 0 0  Trouble relaxing 1 3 0 2  Restless 1 0 0 2  Easily annoyed or irritable 2 1 0 3  Afraid - awful might happen 1 1 0 1  Total GAD 7 Score 8 14 0 11  Anxiety Difficulty Somewhat difficult Somewhat difficult Not difficult at all Somewhat difficult    Tried trazodone 25-50 mg did not help her sleep, taking at 8 or 9 pm,trouble getting to sleep  She is trying to stop smoking, working with a program she needs filled out to help get her assistance   Rybelsus dose was also increased but this caused her severe GI side effects she would like to reduce the dose again Lab Results  Component Value Date   HGBA1C 6.8 (H) 12/14/2022   Lab Results  Component Value Date   CHOL 107 12/14/2022   HDL 40 (L) 12/14/2022   LDLCALC 46 12/14/2022   TRIG 119  12/14/2022   CHOLHDL 2.7 12/14/2022      Patient Active Problem List   Diagnosis Date Noted   S/P CABG x 3 10/22/2022   Coronary artery disease involving left main coronary artery 10/19/2022   Chronic obstructive pulmonary disease, unspecified COPD type (HCC) 10/15/2022   Progressive angina (HCC) 10/15/2022   Type 2 diabetes mellitus with hyperglycemia, without long-term current use of insulin (HCC) 08/18/2021   Polyp of transverse colon    Polyp of sigmoid colon    Essential hypertension 05/19/2018   Tobacco dependence 05/18/2018   Allergic rhinitis, seasonal 04/23/2017   Genital herpes 04/23/2017   MDD (major depressive disorder) 05/17/2016   Cholesteatoma of attic of right ear 12/05/2015   GERD (gastroesophageal reflux disease) 11/29/2015   Vitamin D deficiency 12/13/2014   Anxiety and depression 12/13/2014   Mixed hyperlipidemia 05/26/2014   Smoker 05/26/2014      Current Outpatient Medications:    acetaminophen (TYLENOL) 500 MG tablet, Take 1 tablet (500 mg total) by mouth every  6 (six) hours as needed., Disp: 30 tablet, Rfl: 0   aspirin EC 81 MG tablet, Take 1 tablet (81 mg total) by mouth daily. Swallow whole., Disp: 90 tablet, Rfl: 3   b complex vitamins capsule, Take 1 capsule by mouth daily., Disp: , Rfl:    carvedilol (COREG) 6.25 MG tablet, Take 1 tablet (6.25 mg total) by mouth 2 (two) times daily., Disp: 180 tablet, Rfl: 0   cetirizine (ZYRTEC) 10 MG tablet, Take 10 mg by mouth daily as needed for allergies., Disp: , Rfl:    dapagliflozin propanediol (FARXIGA) 10 MG TABS tablet, Take 1 tablet (10 mg total) by mouth daily., Disp: 90 tablet, Rfl: 1   DULoxetine (CYMBALTA) 30 MG capsule, TAKE 1-2 CAPSULES (30-60 MG TOTAL) BY MOUTH DAILY., Disp: 180 capsule, Rfl: 0   fluticasone (FLONASE) 50 MCG/ACT nasal spray, Place 1 spray into both nostrils daily as needed for allergies., Disp: , Rfl:    lisinopril (ZESTRIL) 2.5 MG tablet, Take 1 tablet (2.5 mg total) by mouth  daily., Disp: 90 tablet, Rfl: 3   nitroGLYCERIN (NITROSTAT) 0.3 MG SL tablet, Place 1 tablet (0.3 mg total) under the tongue every 5 (five) minutes as needed for chest pain., Disp: 90 tablet, Rfl: 12   rosuvastatin (CRESTOR) 40 MG tablet, Take 1 tablet (40 mg total) by mouth daily., Disp: 90 tablet, Rfl: 1   valACYclovir (VALTREX) 1000 MG tablet, TAKE 1 TABLET (1,000 MG TOTAL) BY MOUTH DAILY. TAKE AS NEEDED FOR HERPES OUTBREAK., Disp: 30 tablet, Rfl: 11   Allergies  Allergen Reactions   Cephalexin Other (See Comments)    Secondary yeast infection   Metformin And Related Nausea And Vomiting    With all doses and regular and extended release forms     Social History   Tobacco Use   Smoking status: Some Days    Current packs/day: 0.50    Average packs/day: 0.7 packs/day for 62.6 years (45.3 ttl pk-yrs)    Types: Cigarettes    Start date: 1990   Smokeless tobacco: Never   Tobacco comments:    Working down number of cigarettes a day  Hope to have cessation by Christmas 06/12/2023  Vaping Use   Vaping status: Never Used  Substance Use Topics   Alcohol use: Not Currently    Alcohol/week: 0.0 standard drinks of alcohol   Drug use: No      Chart Review Today: I personally reviewed active problem list, medication list, allergies, family history, social history, health maintenance, notes from last encounter, lab results, imaging with the patient/caregiver today.   Review of Systems  Constitutional: Negative.   HENT: Negative.    Eyes: Negative.   Respiratory: Negative.    Cardiovascular: Negative.   Gastrointestinal: Negative.   Endocrine: Negative.   Genitourinary: Negative.   Musculoskeletal: Negative.   Skin: Negative.   Allergic/Immunologic: Negative.   Neurological: Negative.   Hematological: Negative.   Psychiatric/Behavioral: Negative.    All other systems reviewed and are negative.      Objective:   Vitals:   01/11/23 0833  BP: 124/68  Pulse: 98  Resp: 18   Temp: 98.3 F (36.8 C)  SpO2: 98%  Weight: 161 lb 1.6 oz (73.1 kg)  Height: 5\' 3"  (1.6 m)    Body mass index is 28.54 kg/m.  Physical Exam Vitals and nursing note reviewed.  Constitutional:      General: She is not in acute distress.    Appearance: Normal appearance. She is well-developed. She is  not ill-appearing, toxic-appearing or diaphoretic.  HENT:     Head: Normocephalic and atraumatic.     Nose: Nose normal.  Eyes:     General:        Right eye: No discharge.        Left eye: No discharge.     Conjunctiva/sclera: Conjunctivae normal.  Neck:     Trachea: No tracheal deviation.  Cardiovascular:     Rate and Rhythm: Normal rate and regular rhythm.  Pulmonary:     Effort: Pulmonary effort is normal. No respiratory distress.     Breath sounds: No stridor.  Musculoskeletal:        General: Normal range of motion.  Skin:    General: Skin is warm and dry.     Findings: No rash.  Neurological:     Mental Status: She is alert.     Motor: No abnormal muscle tone.     Coordination: Coordination normal.  Psychiatric:        Behavior: Behavior normal.      Results for orders placed or performed in visit on 12/14/22  Lipid panel  Result Value Ref Range   Cholesterol 107 <200 mg/dL   HDL 40 (L) > OR = 50 mg/dL   Triglycerides 784 <696 mg/dL   LDL Cholesterol (Calc) 46 mg/dL (calc)   Total CHOL/HDL Ratio 2.7 <5.0 (calc)   Non-HDL Cholesterol (Calc) 67 <295 mg/dL (calc)  COMPLETE METABOLIC PANEL WITH GFR  Result Value Ref Range   Glucose, Bld 94 65 - 99 mg/dL   BUN 10 7 - 25 mg/dL   Creat 2.84 1.32 - 4.40 mg/dL   eGFR 102 > OR = 60 VO/ZDG/6.44I3   BUN/Creatinine Ratio SEE NOTE: 6 - 22 (calc)   Sodium 140 135 - 146 mmol/L   Potassium 4.2 3.5 - 5.3 mmol/L   Chloride 108 98 - 110 mmol/L   CO2 23 20 - 32 mmol/L   Calcium 9.3 8.6 - 10.2 mg/dL   Total Protein 6.8 6.1 - 8.1 g/dL   Albumin 4.3 3.6 - 5.1 g/dL   Globulin 2.5 1.9 - 3.7 g/dL (calc)   AG Ratio 1.7 1.0 -  2.5 (calc)   Total Bilirubin 0.4 0.2 - 1.2 mg/dL   Alkaline phosphatase (APISO) 64 31 - 125 U/L   AST 25 10 - 35 U/L   ALT 29 6 - 29 U/L  Hemoglobin A1c  Result Value Ref Range   Hgb A1c MFr Bld 6.8 (H) <5.7 % of total Hgb   Mean Plasma Glucose 148 mg/dL   eAG (mmol/L) 8.2 mmol/L  CBC with Differential/Platelet  Result Value Ref Range   WBC 7.8 3.8 - 10.8 Thousand/uL   RBC 5.01 3.80 - 5.10 Million/uL   Hemoglobin 14.5 11.7 - 15.5 g/dL   HCT 47.4 25.9 - 56.3 %   MCV 89.0 80.0 - 100.0 fL   MCH 28.9 27.0 - 33.0 pg   MCHC 32.5 32.0 - 36.0 g/dL   RDW 87.5 64.3 - 32.9 %   Platelets 211 140 - 400 Thousand/uL   MPV 11.4 7.5 - 12.5 fL   Neutro Abs 3,799 1,500 - 7,800 cells/uL   Lymphs Abs 3,409 850 - 3,900 cells/uL   Absolute Monocytes 413 200 - 950 cells/uL   Eosinophils Absolute 117 15 - 500 cells/uL   Basophils Absolute 62 0 - 200 cells/uL   Neutrophils Relative % 48.7 %   Total Lymphocyte 43.7 %   Monocytes Relative 5.3 %  Eosinophils Relative 1.5 %   Basophils Relative 0.8 %       Assessment & Plan:     ICD-10-CM   1. Major depression, recurrent, chronic (HCC)  F33.9 DULoxetine (CYMBALTA) 60 MG capsule   Med changes do seem to help her mood, PHQ-9 and GAD-7 reviewed today, improved, continue cymbalta    2. Insomnia due to mental disorder  F51.05 traZODone (DESYREL) 100 MG tablet   try higher dose trazodone, no benefit with 25 mg    3. Dyslipidemia associated with type 2 diabetes mellitus (HCC)  E11.69    E78.5    Lipids done about a month ago    4. Encounter for screening mammogram for malignant neoplasm of breast  Z12.31 MM 3D SCREENING MAMMOGRAM BILATERAL BREAST    5. Type 2 diabetes mellitus with hyperglycemia, without long-term current use of insulin (HCC)  E11.65 Semaglutide (RYBELSUS) 7 MG TABS   Med side effect did not tolerate 14 mg Rybelsus request 7 mg med be refilled      Return for end of sept, beginning oct for DM/A1C f/up.     Danelle Berry,  PA-C 01/11/23 9:33 AM

## 2023-01-11 NOTE — Patient Instructions (Signed)
Send me a message if the higher dose trazodone dose not work You can try 100-150 mg about an hour before bedtime There are other medications that we can try Insomnia is a largely psychological issue so addressing possible underlying reasons is sometimes more helpful than any pill I can prescribe for you.  Sleep Hygiene Tips 1) Get regular. One of the best ways to train your body to sleep well is to go to bed and get up at more or less the same time every day, even on weekends and days off! This regular rhythm will make you feel better and will give your body something to work from. 2) Sleep when sleepy. Only try to sleep when you actually feel tired or sleepy, rather than spending too much time awake in bed. 3) Get up & try again. If you haven't been able to get to sleep after about 20 minutes or more, get up and do something calming or boring until you feel sleepy, then return to bed and try again. Sit quietly on the couch with the lights off (bright light will tell your brain that it is time to wake up), or read something boring like the phone book. Avoid doing anything that is too stimulating or interesting, as this will wake you up even more. 4) Avoid caffeine & nicotine. It is best to avoid consuming any caffeine (in coffee, tea, cola drinks, chocolate, and some medications) or nicotine (cigarettes) for at least 4-6 hours before going to bed. These substances act as stimulants and interfere with the ability to fall asleep 5) Avoid alcohol. It is also best to avoid alcohol for at least 4-6 hours before going to bed. Many people believe that alcohol is relaxing and helps them to get to sleep at first, but it actually interrupts the quality of sleep. 6) Bed is for sleeping. Try not to use your bed for anything other than sleeping and sex, so that your body comes to associate bed with sleep. If you use bed as a place to watch TV, eat, read, work on your laptop, pay bills, and  other things, your body will not learn this Connection. 7) No naps. It is best to avoid taking naps during the day, to make sure that you are tired at bedtime. If you can't make it through the day without a nap, make sure it is for less than an hour and before 3pm. 8) Sleep rituals. You can develop your own rituals of things to remind your body that it is time to sleep - some people find it useful to do relaxing stretches or breathing exercises for 15 minutes before bed each night, or sit calmly with a cup of caffeine-free tea. 9) Bathtime. Having a hot bath 1-2 hours before bedtime can be useful, as it will raise your body temperature, causing you to feel sleepy as your body temperature drops again. Research shows that sleepiness is associated with a drop in body temperature. 10) No clock-watching. Many people who struggle with sleep tend to watch the clock too much. Frequently checking the clock during the night can wake you up (especially if you turn on the light to read the time) and reinforces negative thoughts such as "Oh no, look how late it is, I'll never get to sleep" or "it's so early, I have only slept for 5 hours, this is terrible." 11) Use a sleep diary. This worksheet can be a useful way of making sure you have the right facts about your  sleep, rather than making assumptions. Because a diary involves watching the clock (see point 10) it is a good idea to only use it for two weeks to get an idea of what is going and then perhaps two months down the track to see how you are progressing. 12) Exercise. Regular exercise is a good idea to help with good sleep, but try not to do strenuous exercise in the 4 hours before bedtime. Morning walks are a great way to start the day feeling refreshed! 13) Eat right. A healthy, balanced diet will help you to sleep well, but timing is important. Some people find that a very empty stomach at bedtime is distracting, so it can be  useful to have a light snack, but a heavy meal soon before bed can also interrupt sleep. Some people recommend a warm glass of milk, which contains tryptophan, which acts as a natural sleep inducer. 14) The right space. It is very important that your bed and bedroom are quiet and comfortable for sleeping. A cooler room with enough blankets to stay warm is best, and make sure you have curtains or an eyemask to block out early morning light and earplugs if there is noise outside your room. 15) Keep daytime routine the same. Even if you have a bad night sleep and are tired it is important that you try to keep your daytime activities the same as you had planned. That is, don't avoid activities because you feel tired. This can reinforce the insomnia.

## 2023-01-17 DIAGNOSIS — Z419 Encounter for procedure for purposes other than remedying health state, unspecified: Secondary | ICD-10-CM | POA: Diagnosis not present

## 2023-01-25 ENCOUNTER — Encounter: Payer: Self-pay | Admitting: Family Medicine

## 2023-01-28 ENCOUNTER — Ambulatory Visit: Payer: 59 | Admitting: Family Medicine

## 2023-01-31 ENCOUNTER — Ambulatory Visit
Admission: RE | Admit: 2023-01-31 | Discharge: 2023-01-31 | Disposition: A | Discharge status: Home / Self Care | Payer: Medicaid Other | Source: Ambulatory Visit | Attending: Family Medicine | Admitting: Family Medicine

## 2023-01-31 ENCOUNTER — Encounter: Payer: 59 | Attending: Cardiology

## 2023-01-31 VITALS — Ht 64.5 in | Wt 162.3 lb

## 2023-01-31 DIAGNOSIS — Z1231 Encounter for screening mammogram for malignant neoplasm of breast: Secondary | ICD-10-CM | POA: Insufficient documentation

## 2023-01-31 DIAGNOSIS — Z951 Presence of aortocoronary bypass graft: Secondary | ICD-10-CM | POA: Diagnosis present

## 2023-01-31 DIAGNOSIS — Z48812 Encounter for surgical aftercare following surgery on the circulatory system: Secondary | ICD-10-CM | POA: Diagnosis not present

## 2023-01-31 NOTE — Patient Instructions (Signed)
Patient Instructions  Patient Details  Name: Lauren [name] MRN: [id number] Date of Birth: [birth year] Referring Provider:  Danelle Berry, PA-C  Below are your personal goals for exercise, nutrition, and risk factors. Our goal is to help you stay on track towards obtaining and maintaining these goals. We will be discussing your progress on these goals with you throughout the program.  Initial Exercise Prescription:  Initial Exercise Prescription - 01/31/23 1600       Date of Initial Exercise RX and Referring Provider   Date 01/31/23    Referring Provider Dr. Herbie [name]      Oxygen   Maintain Oxygen Saturation 88% or higher      Treadmill   MPH 2.2    Grade 0    Minutes 15    METs 2.68      NuStep   Level 2    SPM 80    Minutes 15    METs 3.74      T5 Nustep   Level 2   T6 nustep   SPM 80    Minutes 15    METs 3.74      Prescription Details   Frequency (times per week) 3    Duration Progress to 30 minutes of continuous aerobic without signs/symptoms of physical distress      Intensity   THRR 40-80% of Max Heartrate 113-153    Ratings of Perceived Exertion 11-13    Perceived Dyspnea 0-4      Progression   Progression Continue to progress workloads to maintain intensity without signs/symptoms of physical distress.      Resistance Training   Training Prescription Yes    Weight 4 lb    Reps 10-15             Exercise Goals: Frequency: Be able to perform aerobic exercise two to three times per week in program working toward 2-5 days per week of home exercise.  Intensity: Work with a perceived exertion of 11 (fairly light) - 15 (hard) while following your exercise prescription.  We will make changes to your prescription with you as you progress through the program.   Duration: Be able to do 30 to 45 minutes of continuous aerobic exercise in addition to a 5 minute warm-up and a 5 minute cool-down routine.   Nutrition Goals: Your personal nutrition goals will be  established when you do your nutrition analysis with the dietician.  The following are general nutrition guidelines to follow: Cholesterol < 200mg /day Sodium < 1500mg /day Fiber: Women under 50 yrs - 25 grams per day  Personal Goals:  Personal Goals and Risk Factors at Admission - 01/07/23 1131       Core Components/Risk Factors/Patient Goals on Admission   Number of packs per day Smoking cessation instruction/counseling given:  counseled patient on the dangers of tobacco use, advised patient to stop smoking, and reviewed strategies to maximize success. Lauren [name] is a current tobacco user. Intervention for tobacco cessation was provided at the initial medical review. She was asked about readiness to quit and reported that she is weaning slowly to a Quit date of 06/12/2023 . Patient was advised and educated about tobacco cessation using combination therapy, tobacco cessation classes, quit line, and quit smoking apps. Patient demonstrated understanding of this material. Staff will continue to provide encouragement and follow up with the patient throughout the program.             Tobacco Use Initial Evaluation: Social History   Tobacco Use  Smoking Status Some Days   Current packs/day: 0.50   Average packs/day: 0.7 packs/day for 62.6 years (45.3 ttl pk-yrs)   Types: Cigarettes   Start date: 1990  Smokeless Tobacco Never  Tobacco Comments   Working down number of cigarettes a day  Hope to have cessation by Christmas 06/12/2023    Exercise Goals and Review:  Exercise Goals     Row Name 01/31/23 1608             Exercise Goals   Increase Physical Activity Yes       Intervention Develop an individualized exercise prescription for aerobic and resistive training based on initial evaluation findings, risk stratification, comorbidities and participant's personal goals.;Provide advice, education, support and counseling about physical activity/exercise needs.       Expected Outcomes  Short Term: Attend rehab on a regular basis to increase amount of physical activity.;Long Term: Add in home exercise to make exercise part of routine and to increase amount of physical activity.;Long Term: Exercising regularly at least 3-5 days a week.       Increase Strength and Stamina Yes       Intervention Provide advice, education, support and counseling about physical activity/exercise needs.;Develop an individualized exercise prescription for aerobic and resistive training based on initial evaluation findings, risk stratification, comorbidities and participant's personal goals.       Expected Outcomes Short Term: Perform resistance training exercises routinely during rehab and add in resistance training at home;Short Term: Increase workloads from initial exercise prescription for resistance, speed, and METs.;Long Term: Improve cardiorespiratory fitness, muscular endurance and strength as measured by increased METs and functional capacity ( )       Able to understand and use rate of perceived exertion (RPE) scale Yes       Intervention Provide education and explanation on how to use RPE scale       Expected Outcomes Short Term: Able to use RPE daily in rehab to express subjective intensity level;Long Term:  Able to use RPE to guide intensity level when exercising independently       Able to understand and use Dyspnea scale Yes       Intervention Provide education and explanation on how to use Dyspnea scale       Expected Outcomes Short Term: Able to use Dyspnea scale daily in rehab to express subjective sense of shortness of breath during exertion       Knowledge and understanding of Target Heart Rate Range (THRR) Yes       Intervention Provide education and explanation of THRR including how the numbers were predicted and where they are located for reference       Expected Outcomes Short Term: Able to state/look up THRR;Long Term: Able to use THRR to govern intensity when exercising  independently;Short Term: Able to use daily as guideline for intensity in rehab       Able to check pulse independently Yes       Intervention Provide education and demonstration on how to check pulse in carotid and radial arteries.;Review the importance of being able to check your own pulse for safety during independent exercise       Expected Outcomes Short Term: Able to explain why pulse checking is important during independent exercise;Long Term: Able to check pulse independently and accurately       Understanding of Exercise Prescription Yes       Intervention Provide education, explanation, and written materials on patient's individual exercise prescription  Expected Outcomes Short Term: Able to explain program exercise prescription;Long Term: Able to explain home exercise prescription to exercise independently

## 2023-01-31 NOTE — Progress Notes (Signed)
Cardiac Individual Treatment Plan  Patient Details  Name: Lauren [name] MRN: [id number] Date of Birth: 06-22-1975 Referring Provider:   Flowsheet Row Cardiac Rehab from 01/31/2023 in [facility] Cardiac and Pulmonary Rehab  Referring Provider Dr. [name]       Initial Encounter Date:  Flowsheet Row Cardiac Rehab from 01/31/2023 in [facility] Cardiac and Pulmonary Rehab  Date 01/31/23       Visit Diagnosis: S/P CABG x 3  Patient's Home Medications on Admission:  Current Outpatient Medications:    acetaminophen (TYLENOL) 500 MG tablet, Take 1 tablet (500 mg total) by mouth every 6 (six) hours as needed., Disp: 30 tablet, Rfl: 0   aspirin EC 81 MG tablet, Take 1 tablet (81 mg total) by mouth daily. Swallow whole., Disp: 90 tablet, Rfl: 3   b complex vitamins capsule, Take 1 capsule by mouth daily., Disp: , Rfl:    carvedilol (COREG) 6.25 MG tablet, Take 1 tablet (6.25 mg total) by mouth 2 (two) times daily., Disp: 180 tablet, Rfl: 0   cetirizine (ZYRTEC) 10 MG tablet, Take 10 mg by mouth daily as needed for allergies., Disp: , Rfl:    dapagliflozin propanediol (FARXIGA) 10 MG TABS tablet, Take 1 tablet (10 mg total) by mouth daily., Disp: 90 tablet, Rfl: 1   DULoxetine (CYMBALTA) 60 MG capsule, Take 1 capsule (60 mg total) by mouth 2 (two) times daily., Disp: 180 capsule, Rfl: 1   fluticasone (FLONASE) 50 MCG/ACT nasal spray, Place 1 spray into both nostrils daily as needed for allergies., Disp: , Rfl:    lisinopril (ZESTRIL) 2.5 MG tablet, Take 1 tablet (2.5 mg total) by mouth daily., Disp: 90 tablet, Rfl: 3   nitroGLYCERIN (NITROSTAT) 0.3 MG SL tablet, Place 1 tablet (0.3 mg total) under the tongue every 5 (five) minutes as needed for chest pain., Disp: 90 tablet, Rfl: 12   rosuvastatin (CRESTOR) 40 MG tablet, Take 1 tablet (40 mg total) by mouth daily., Disp: 90 tablet, Rfl: 1   Semaglutide (RYBELSUS) 7 MG TABS, Take 1 tablet (7 mg total) by mouth daily., Disp: 90 tablet, Rfl: 1   traZODone  (DESYREL) 100 MG tablet, Take 1 tablet (100 mg total) by mouth at bedtime., Disp: 90 tablet, Rfl: 1   valACYclovir (VALTREX) 1000 MG tablet, TAKE 1 TABLET (1,000 MG TOTAL) BY MOUTH DAILY. TAKE AS NEEDED FOR HERPES OUTBREAK., Disp: 30 tablet, Rfl: 11  Past Medical History: Past Medical History:  Diagnosis Date   Allergic rhinitis    Anxiety    Chronic depression    Diabetes mellitus without complication (HCC)    Elevated LFTs    Genital herpes    GERD (gastroesophageal reflux disease)    Hyperlipidemia    Vitamin D deficiency     Tobacco Use: Social History   Tobacco Use  Smoking Status Some Days   Current packs/day: 0.50   Average packs/day: 0.7 packs/day for 62.6 years (45.3 ttl pk-yrs)   Types: Cigarettes   Start date: 1990  Smokeless Tobacco Never  Tobacco Comments   Working down number of cigarettes a day  Hope to have cessation by Christmas 06/12/2023    Labs: Review Flowsheet  More data exists      Latest Ref Rng & Units 08/31/2022 10/21/2022 10/22/2022 10/23/2022 12/14/2022  Labs for ITP Cardiac and Pulmonary Rehab  Cholestrol <200 mg/dL 034  - - - 742   LDL (calc) mg/dL (calc) 74  - - - 46   HDL-C > OR = 50 mg/dL 35  - - -  40   Trlycerides <150 mg/dL 191  - - - 478   Hemoglobin A1c <5.7 % of total Hgb 10.9  8.5  - - 6.8   PH, Arterial 7.35 - 7.45 - 7.44  7.322  7.340  7.271  7.328  7.301  7.396  7.302  7.387  -  PCO2 arterial 32 - 48 mmHg - 37  34.0  36.7  35.5  43.4  44.2  38.2  48.1  36.4  -  Bicarbonate 20.0 - 28.0 mmol/L - 25.1  17.5  19.7  16.4  22.9  21.8  24.6  23.4  23.7  21.8  -  TCO2 22 - 32 mmol/L - - 18  21  17  24  25  23  25  26  25  26  26  25  23   -  Acid-base deficit 0.0 - 2.0 mmol/L - - 8.0  5.0  10.0  3.0  4.0  1.0  1.0  3.0  3.0  -  O2 Saturation % - 99.5  95  97  98  95  100  76  100  100  96  -    Details       Multiple values from one day are sorted in reverse-chronological order          Exercise Target Goals: Exercise Program  Goal: Individual exercise prescription set using results from initial 6 min walk test and THRR while considering  patient's activity barriers and safety.   Exercise Prescription Goal: Initial exercise prescription builds to 30-45 minutes a day of aerobic activity, 2-3 days per week.  Home exercise guidelines will be given to patient during program as part of exercise prescription that the participant will acknowledge.   Education: Aerobic Exercise: - Group verbal and visual presentation on the components of exercise prescription. Introduces F.I.T.T principle from ACSM for exercise prescriptions.  Reviews F.I.T.T. principles of aerobic exercise including progression. Written material given at graduation. Flowsheet Row Cardiac Rehab from 01/31/2023 in [facility] Cardiac and Pulmonary Rehab  Education need identified 01/31/23       Education: Resistance Exercise: - Group verbal and visual presentation on the components of exercise prescription. Introduces F.I.T.T principle from ACSM for exercise prescriptions  Reviews F.I.T.T. principles of resistance exercise including progression. Written material given at graduation.    Education: Exercise & Equipment Safety: - Individual verbal instruction and demonstration of equipment use and safety with use of the equipment. Flowsheet Row Cardiac Rehab from 01/31/2023 in [facility] Cardiac and Pulmonary Rehab  Date 01/31/23  Educator NT  Instruction Review Code 1- Verbalizes Understanding       Education: Exercise Physiology & General Exercise Guidelines: - Group verbal and written instruction with models to review the exercise physiology of the cardiovascular system and associated critical values. Provides general exercise guidelines with specific guidelines to those with heart or lung disease.    Education: Flexibility, Balance, Mind/Body Relaxation: - Group verbal and visual presentation with interactive activity on the components of exercise prescription.  Introduces F.I.T.T principle from ACSM for exercise prescriptions. Reviews F.I.T.T. principles of flexibility and balance exercise training including progression. Also discusses the mind body connection.  Reviews various relaxation techniques to help reduce and manage stress (i.e. Deep breathing, progressive muscle relaxation, and visualization). Balance handout provided to take home. Written material given at graduation.   Activity Barriers & Risk Stratification:  Activity Barriers & Cardiac Risk Stratification - 01/31/23 1608       Activity Barriers &  Cardiac Risk Stratification   Activity Barriers Joint Problems;Other (comment)    Comments R hip pain    Cardiac Risk Stratification High             6 Minute Walk:  6 Minute Walk     Row Name 01/31/23 1607         6 Minute Walk   Phase Initial     Distance 1165 feet     Walk Time 6 minutes     # of Rest Breaks 0     MPH 2.21     METS 3.74     RPE 9     Perceived Dyspnea  0     VO2 Peak 13.11     Symptoms No     Resting HR 74 bpm     Resting BP 116/68     Resting Oxygen Saturation  98 %     Exercise Oxygen Saturation  during 6 min walk 99 %     Max Ex. HR 93 bpm     Max Ex. BP 122/64     2 Minute Post BP 110/64              Oxygen Initial Assessment:   Oxygen Re-Evaluation:   Oxygen Discharge (Final Oxygen Re-Evaluation):   Initial Exercise Prescription:  Initial Exercise Prescription - 01/31/23 1600       Date of Initial Exercise RX and Referring Provider   Date 01/31/23    Referring Provider Dr. [name]      Oxygen   Maintain Oxygen Saturation 88% or higher      Treadmill   MPH 2.2    Grade 0    Minutes 15    METs 2.68      NuStep   Level 2    SPM 80    Minutes 15    METs 3.74      T5 Nustep   Level 2   T6 nustep   SPM 80    Minutes 15    METs 3.74      Prescription Details   Frequency (times per week) 3    Duration Progress to 30 minutes of continuous aerobic without  signs/symptoms of physical distress      Intensity   THRR 40-80% of Max Heartrate 113-153    Ratings of Perceived Exertion 11-13    Perceived Dyspnea 0-4      Progression   Progression Continue to progress workloads to maintain intensity without signs/symptoms of physical distress.      Resistance Training   Training Prescription Yes    Weight 4 lb    Reps 10-15             Perform Capillary Blood Glucose checks as needed.  Exercise Prescription Changes:   Exercise Prescription Changes     Row Name 01/31/23 1600             Response to Exercise   Blood Pressure (Admit) 116/68       Blood Pressure (Exercise) 122/64       Blood Pressure (Exit) 110/64       Heart Rate (Admit) 74 bpm       Heart Rate (Exercise) 93 bpm       Heart Rate (Exit) 75 bpm       Oxygen Saturation (Admit) 98 %       Oxygen Saturation (Exercise) 99 %       Rating of Perceived Exertion (Exercise) 9  Perceived Dyspnea (Exercise) 0       Symptoms None       Comments Results                Exercise Comments:   Exercise Goals and Review:   Exercise Goals     Row Name 01/31/23 1608             Exercise Goals   Increase Physical Activity Yes       Intervention Develop an individualized exercise prescription for aerobic and resistive training based on initial evaluation findings, risk stratification, comorbidities and participant's personal goals.;Provide advice, education, support and counseling about physical activity/exercise needs.       Expected Outcomes Short Term: Attend rehab on a regular basis to increase amount of physical activity.;Long Term: Add in home exercise to make exercise part of routine and to increase amount of physical activity.;Long Term: Exercising regularly at least 3-5 days a week.       Increase Strength and Stamina Yes       Intervention Provide advice, education, support and counseling about physical activity/exercise needs.;Develop an  individualized exercise prescription for aerobic and resistive training based on initial evaluation findings, risk stratification, comorbidities and participant's personal goals.       Expected Outcomes Short Term: Perform resistance training exercises routinely during rehab and add in resistance training at home;Short Term: Increase workloads from initial exercise prescription for resistance, speed, and METs.;Long Term: Improve cardiorespiratory fitness, muscular endurance and strength as measured by increased METs and functional capacity ( )       Able to understand and use rate of perceived exertion (RPE) scale Yes       Intervention Provide education and explanation on how to use RPE scale       Expected Outcomes Short Term: Able to use RPE daily in rehab to express subjective intensity level;Long Term:  Able to use RPE to guide intensity level when exercising independently       Able to understand and use Dyspnea scale Yes       Intervention Provide education and explanation on how to use Dyspnea scale       Expected Outcomes Short Term: Able to use Dyspnea scale daily in rehab to express subjective sense of shortness of breath during exertion       Knowledge and understanding of Target Heart Rate Range (THRR) Yes       Intervention Provide education and explanation of THRR including how the numbers were predicted and where they are located for reference       Expected Outcomes Short Term: Able to state/look up THRR;Long Term: Able to use THRR to govern intensity when exercising independently;Short Term: Able to use daily as guideline for intensity in rehab       Able to check pulse independently Yes       Intervention Provide education and demonstration on how to check pulse in carotid and radial arteries.;Review the importance of being able to check your own pulse for safety during independent exercise       Expected Outcomes Short Term: Able to explain why pulse checking is important during  independent exercise;Long Term: Able to check pulse independently and accurately       Understanding of Exercise Prescription Yes       Intervention Provide education, explanation, and written materials on patient's individual exercise prescription       Expected Outcomes Short Term: Able to explain program exercise prescription;Long Term: Able to  explain home exercise prescription to exercise independently                Exercise Goals Re-Evaluation :   Discharge Exercise Prescription (Final Exercise Prescription Changes):  Exercise Prescription Changes - 01/31/23 1600       Response to Exercise   Blood Pressure (Admit) 116/68    Blood Pressure (Exercise) 122/64    Blood Pressure (Exit) 110/64    Heart Rate (Admit) 74 bpm    Heart Rate (Exercise) 93 bpm    Heart Rate (Exit) 75 bpm    Oxygen Saturation (Admit) 98 %    Oxygen Saturation (Exercise) 99 %    Rating of Perceived Exertion (Exercise) 9    Perceived Dyspnea (Exercise) 0    Symptoms None    Comments Results             Nutrition:  Target Goals: Understanding of nutrition guidelines, daily intake of sodium 1500mg , cholesterol 200mg , calories 30% from fat and 7% or less from saturated fats, daily to have 5 or more servings of fruits and vegetables.  Education: All About Nutrition: -Group instruction provided by verbal, written material, interactive activities, discussions, models, and posters to present general guidelines for heart healthy nutrition including fat, fiber, MyPlate, the role of sodium in heart healthy nutrition, utilization of the nutrition label, and utilization of this knowledge for meal planning. Follow up email sent as well. Written material given at graduation. Flowsheet Row Cardiac Rehab from 01/31/2023 in [facility] Cardiac and Pulmonary Rehab  Education need identified 01/31/23       Biometrics:  Pre Biometrics - 01/31/23 1609       Pre Biometrics   Height 5' 4.5" (1.638 m)    Weight  162 lb 4.8 oz (73.6 kg)    Waist Circumference 37 inches    Hip Circumference 38 inches    Waist to Hip Ratio 0.97 %    BMI (Calculated) 27.44    Single Leg Stand 25 seconds              Nutrition Therapy Plan and Nutrition Goals:   Nutrition Assessments:  MEDIFICTS Score Key: ?70 Need to make dietary changes  40-70 Heart Healthy Diet ? 40 Therapeutic Level Cholesterol Diet  Flowsheet Row Cardiac Rehab from 01/31/2023 in [facility] Cardiac and Pulmonary Rehab  Picture Your Plate Total Score on Admission 38      Picture Your Plate Scores: <16 Unhealthy dietary pattern with much room for improvement. 41-50 Dietary pattern unlikely to meet recommendations for good health and room for improvement. 51-60 More healthful dietary pattern, with some room for improvement.  >60 Healthy dietary pattern, although there may be some specific behaviors that could be improved.    Nutrition Goals Re-Evaluation:   Nutrition Goals Discharge (Final Nutrition Goals Re-Evaluation):   Psychosocial: Target Goals: Acknowledge presence or absence of significant depression and/or stress, maximize coping skills, provide positive support system. Participant is able to verbalize types and ability to use techniques and skills needed for reducing stress and depression.   Education: Stress, Anxiety, and Depression - Group verbal and visual presentation to define topics covered.  Reviews how body is impacted by stress, anxiety, and depression.  Also discusses healthy ways to reduce stress and to treat/manage anxiety and depression.  Written material given at graduation.   Education: Sleep Hygiene -Provides group verbal and written instruction about how sleep can affect your health.  Define sleep hygiene, discuss sleep cycles and impact of sleep habits. Review  good sleep hygiene tips.    Initial Review & Psychosocial Screening:  Initial Psych Review & Screening - 01/07/23 1027       Initial Review    Current issues with None Identified      Family Dynamics   Good Support System? Yes   daughter, son, mama     Barriers   Psychosocial barriers to participate in program There are no identifiable barriers or psychosocial needs.      Screening Interventions   Interventions Encouraged to exercise;To provide support and resources with identified psychosocial needs;Provide feedback about the scores to participant    Expected Outcomes Short Term goal: Utilizing psychosocial counselor, staff and physician to assist with identification of specific Stressors or current issues interfering with healing process. Setting desired goal for each stressor or current issue identified.;Long Term Goal: Stressors or current issues are controlled or eliminated.;Short Term goal: Identification and review with participant of any Quality of Life or Depression concerns found by scoring the questionnaire.;Long Term goal: The participant improves quality of Life and PHQ9 Scores as seen by post scores and/or verbalization of changes             Quality of Life Scores:   Quality of Life - 01/31/23 1556       Quality of Life   Select Quality of Life      Quality of Life Scores   Health/Function Pre 23.2 %    Socioeconomic Pre 23.63 %    Psych/Spiritual Pre 24.79 %    Family Pre 24 %    GLOBAL Pre 23.73 %            Scores of 19 and below usually indicate a poorer quality of life in these areas.  A difference of  2-3 points is a clinically meaningful difference.  A difference of 2-3 points in the total score of the Quality of Life Index has been associated with significant improvement in overall quality of life, self-image, physical symptoms, and general health in studies assessing change in quality of life.  PHQ-9: Review Flowsheet  More data exists      01/31/2023 01/11/2023 12/14/2022 11/06/2022 09/10/2022  Depression screen PHQ 2/9  Decreased Interest 1 1 1 1  0  Down, Depressed, Hopeless 0 0 1 1 0  PHQ  - 2 Score 1 1 2 2  0  Altered sleeping 1 1 0 0 0  Tired, decreased energy 1 1 0 0 0  Change in appetite 1 0 0 0 0  Feeling bad or failure about yourself  0 1 1 0 0  Trouble concentrating 0 0 0 0 0  Moving slowly or fidgety/restless 0 0 0 0 0  Suicidal thoughts 0 0 0 0 0  PHQ-9 Score 4 4 3 2  0  Difficult doing work/chores Not difficult at all Not difficult at all Somewhat difficult Somewhat difficult Not difficult at all    Details           Interpretation of Total Score  Total Score Depression Severity:  1-4 = Minimal depression, 5-9 = Mild depression, 10-14 = Moderate depression, 15-19 = Moderately severe depression, 20-27 = Severe depression   Psychosocial Evaluation and Intervention:  Psychosocial Evaluation - 01/07/23 1033       Psychosocial Evaluation & Interventions   Interventions Encouraged to exercise with the program and follow exercise prescription    Comments Nakeita has no barriers to attending the program.  She has a good support system with her daughter, son and  her Mother.  She hopes to learn her heart rate limits and how to manage her heart disease so she can return to hiking and visiting waterfalls.    Expected Outcomes STG Tillman Sers is able to attend all scheduled sessions and is able to see progression with her exercise. Her goal is to have stamina to get back to hiking and visiting waterfalls. LTG Symphoni has returned to her hikes and visiting warefalls without any cardiac concerns.    Continue Psychosocial Services  Follow up required by staff             Psychosocial Re-Evaluation:   Psychosocial Discharge (Final Psychosocial Re-Evaluation):   Vocational Rehabilitation: Provide vocational rehab assistance to qualifying candidates.   Vocational Rehab Evaluation & Intervention:  Vocational Rehab - 01/07/23 1029       Initial Vocational Rehab Evaluation & Intervention   Assessment shows need for Vocational Rehabilitation No      Vocational Rehab  Re-Evaulation   Comments going back to work on 8/5             Education: Education Goals: Education classes will be provided on a variety of topics geared toward better understanding of heart health and risk factor modification. Participant will state understanding/return demonstration of topics presented as noted by education test scores.  Learning Barriers/Preferences:  Learning Barriers/Preferences - 01/07/23 1029       Learning Barriers/Preferences   Learning Barriers None    Learning Preferences None             General Cardiac Education Topics:  AED/CPR: - Group verbal and written instruction with the use of models to demonstrate the basic use of the AED with the basic ABC's of resuscitation.   Anatomy and Cardiac Procedures: - Group verbal and visual presentation and models provide information about basic cardiac anatomy and function. Reviews the testing methods done to diagnose heart disease and the outcomes of the test results. Describes the treatment choices: Medical Management, Angioplasty, or Coronary Bypass Surgery for treating various heart conditions including Myocardial Infarction, Angina, Valve Disease, and Cardiac Arrhythmias.  Written material given at graduation. Flowsheet Row Cardiac Rehab from 01/31/2023 in [facility] Cardiac and Pulmonary Rehab  Education need identified 01/31/23       Medication Safety: - Group verbal and visual instruction to review commonly prescribed medications for heart and lung disease. Reviews the medication, class of the drug, and side effects. Includes the steps to properly store meds and maintain the prescription regimen.  Written material given at graduation.   Intimacy: - Group verbal instruction through game format to discuss how heart and lung disease can affect sexual intimacy. Written material given at graduation..   Know Your Numbers and Heart Failure: - Group verbal and visual instruction to discuss disease risk  factors for cardiac and pulmonary disease and treatment options.  Reviews associated critical values for Overweight/Obesity, Hypertension, Cholesterol, and Diabetes.  Discusses basics of heart failure: signs/symptoms and treatments.  Introduces Heart Failure Zone chart for action plan for heart failure.  Written material given at graduation.   Infection Prevention: - Provides verbal and written material to individual with discussion of infection control including proper hand washing and proper equipment cleaning during exercise session. Flowsheet Row Cardiac Rehab from 01/31/2023 in [facility] Cardiac and Pulmonary Rehab  Date 01/31/23  Educator NT  Instruction Review Code 1- Verbalizes Understanding       Falls Prevention: - Provides verbal and written material to individual with discussion of falls prevention and  safety. Flowsheet Row Cardiac Rehab from 01/31/2023 in [facility] Cardiac and Pulmonary Rehab  Date 01/07/23  Educator SB  Instruction Review Code 1- Verbalizes Understanding       Other: -Provides group and verbal instruction on various topics (see comments)   Knowledge Questionnaire Score:  Knowledge Questionnaire Score - 01/31/23 1556       Knowledge Questionnaire Score   Pre Score 20/26             Core Components/Risk Factors/Patient Goals at Admission:  Personal Goals and Risk Factors at Admission - 01/07/23 1131       Core Components/Risk Factors/Patient Goals on Admission   Number of packs per day Smoking cessation instruction/counseling given:  counseled patient on the dangers of tobacco use, advised patient to stop smoking, and reviewed strategies to maximize success. Sri is a current tobacco user. Intervention for tobacco cessation was provided at the initial medical review. She was asked about readiness to quit and reported that she is weaning slowly to a Quit date of 06/12/2023 . Patient was advised and educated about tobacco cessation using combination  therapy, tobacco cessation classes, quit line, and quit smoking apps. Patient demonstrated understanding of this material. Staff will continue to provide encouragement and follow up with the patient throughout the program.             Education:Diabetes - Individual verbal and written instruction to review signs/symptoms of diabetes, desired ranges of glucose level fasting, after meals and with exercise. Acknowledge that pre and post exercise glucose checks will be done for 3 sessions at entry of program. Flowsheet Row Cardiac Rehab from 01/31/2023 in [facility] Cardiac and Pulmonary Rehab  Date 01/31/23  Educator NT  Instruction Review Code 1- Verbalizes Understanding       Core Components/Risk Factors/Patient Goals Review:    Core Components/Risk Factors/Patient Goals at Discharge (Final Review):    ITP Comments:  ITP Comments     Row Name 01/07/23 1131 01/31/23 1553         ITP Comments irtual orientation call completed today. shehas an appointment on Date: 01/31/2023  for EP eval and gym Orientation.  Documentation of diagnosis can be found in [facility] 10/19/2022 .   Smoking cessation instruction/counseling given:  counseled patient on the dangers of tobacco use, advised patient to stop smoking, and reviewed strategies to maximize success. Tajanique is a current tobacco user. Intervention for tobacco cessation was provided at the initial medical review. She was asked about readiness to quit and reported that she is weaning slowly to a Quit date of 06/12/2023 . Patient was advised and educated about tobacco cessation using combination therapy, tobacco cessation classes, quit line, and quit smoking apps. Patient demonstrated understanding of this material. Staff will continue to provide encouragement and follow up with the patient throughout the program. Completed and gym orientation. Initial ITP created and sent for review to Dr. Bethann Punches, Medical Director.               Comments:  Initial ITP

## 2023-02-02 ENCOUNTER — Other Ambulatory Visit: Payer: Self-pay | Admitting: Cardiology

## 2023-02-04 ENCOUNTER — Encounter: Payer: 59 | Admitting: *Deleted

## 2023-02-04 ENCOUNTER — Ambulatory Visit: Payer: 59

## 2023-02-04 DIAGNOSIS — Z951 Presence of aortocoronary bypass graft: Secondary | ICD-10-CM

## 2023-02-04 LAB — GLUCOSE, CAPILLARY
Glucose-Capillary: 99 mg/dL (ref 70–99)
Glucose-Capillary: 94 mg/dL (ref 70–99)

## 2023-02-04 NOTE — Progress Notes (Signed)
Daily Session Note  Patient Details  Name: Lauren [name] MRN: [id number] Date of Birth: [birth year] Referring Provider:   Flowsheet Row Cardiac Rehab from 01/31/2023 in [facility] Cardiac and Pulmonary Rehab  Referring Provider Dr. [name]       Encounter Date: 02/04/2023  Check In:  Session Check In - 02/04/23 1724       Check-In   Supervising physician immediately available to respond to emergencies See telemetry face sheet for immediately available ER MD    Location [facility]-Cardiac & Pulmonary Rehab    Staff Present Susann Givens, RN Mabeline Caras, BS, ACSM CEP, Exercise Physiologist;Laureen Manson Passey, BS, RRT, CPFT    Virtual Visit No    Medication changes reported     No    Fall or balance concerns reported    No    Tobacco Cessation Use Decreased    Current number of cigarettes/nicotine per day     7    Warm-up and Cool-down Performed on first and last piece of equipment    Resistance Training Performed Yes    VAD Patient? No    PAD/SET Patient? No      Pain Assessment   Currently in Pain? No/denies                Social History   Tobacco Use  Smoking Status Some Days   Current packs/day: 0.50   Average packs/day: 0.7 packs/day for 62.6 years (45.3 ttl pk-yrs)   Types: Cigarettes   Start date: 1990  Smokeless Tobacco Never  Tobacco Comments   Working down number of cigarettes a day  Hope to have cessation by Christmas 06/12/2023    Goals Met:  Independence with exercise equipment Exercise tolerated well No report of concerns or symptoms today Strength training completed today  Goals Unmet:  Not Applicable  Comments: First full day of exercise!  Patient was oriented to gym and equipment including functions, settings, policies, and procedures.  Patient's individual exercise prescription and treatment plan were reviewed.  All starting workloads were established based on the results of the 6 minute walk test done at initial orientation visit.  The plan for  exercise progression was also introduced and progression will be customized based on patient's performance and goals. .    Dr. Bethann Punches is Medical Director for [facility] Cardiac Rehabilitation.  Dr. Vida Rigger is Medical Director for [facility] Pulmonary Rehabilitation.

## 2023-02-06 ENCOUNTER — Encounter: Payer: 59 | Admitting: *Deleted

## 2023-02-06 DIAGNOSIS — Z951 Presence of aortocoronary bypass graft: Secondary | ICD-10-CM

## 2023-02-06 LAB — GLUCOSE, CAPILLARY
Glucose-Capillary: 145 mg/dL — ABNORMAL HIGH (ref 70–99)
Glucose-Capillary: 90 mg/dL (ref 70–99)

## 2023-02-06 NOTE — Progress Notes (Signed)
Daily Session Note  Patient Details  Name: Lauren [name] MRN: [id number] Date of Birth: 02-09-1976 Referring Provider:   Flowsheet Row Cardiac Rehab from 01/31/2023 in [facility] Cardiac and Pulmonary Rehab  Referring Provider Dr. [name]       Encounter Date: 02/06/2023  Check In:  Session Check In - 02/06/23 1643       Check-In   Supervising physician immediately available to respond to emergencies See telemetry face sheet for immediately available ER MD    Location [facility]-Cardiac & Pulmonary Rehab    Staff Present Maxon Suzzette Righter, , Exercise Physiologist;Dontarius Sheley Jewel Baize, RN BSN;Susanne Bice, RN, BSN, CCRP    Virtual Visit No    Medication changes reported     No    Fall or balance concerns reported    No    Tobacco Cessation No Change    Current number of cigarettes/nicotine per day     7    Warm-up and Cool-down Performed on first and last piece of equipment    Resistance Training Performed Yes      Pain Assessment   Currently in Pain? No/denies                Social History   Tobacco Use  Smoking Status Some Days   Current packs/day: 0.50   Average packs/day: 0.7 packs/day for 62.6 years (45.3 ttl pk-yrs)   Types: Cigarettes   Start date: 1990  Smokeless Tobacco Never  Tobacco Comments   Working down number of cigarettes a day  Hope to have cessation by Christmas 06/12/2023    Goals Met:  Independence with exercise equipment Exercise tolerated well No report of concerns or symptoms today Strength training completed today  Goals Unmet:  Not Applicable  Comments: Pt able to follow exercise prescription today without complaint.  Will continue to monitor for progression.    Dr. Bethann Punches is Medical Director for [facility] Cardiac Rehabilitation.  Dr. Vida Rigger is Medical Director for [facility] Pulmonary Rehabilitation.

## 2023-02-07 ENCOUNTER — Encounter: Payer: 59 | Admitting: *Deleted

## 2023-02-07 DIAGNOSIS — Z951 Presence of aortocoronary bypass graft: Secondary | ICD-10-CM

## 2023-02-07 LAB — GLUCOSE, CAPILLARY
Glucose-Capillary: 102 mg/dL — ABNORMAL HIGH (ref 70–99)
Glucose-Capillary: 88 mg/dL (ref 70–99)

## 2023-02-07 NOTE — Progress Notes (Signed)
Daily Session Note  Patient Details  Name: Lauren [name] MRN: [id number] Date of Birth: Aug 10, 1975 Referring Provider:   Flowsheet Row Cardiac Rehab from 01/31/2023 in [facility] Cardiac and Pulmonary Rehab  Referring Provider Dr. [name]       Encounter Date: 02/07/2023  Check In:  Session Check In - 02/07/23 1719       Check-In   Supervising physician immediately available to respond to emergencies See telemetry face sheet for immediately available ER MD    Location [facility]-Cardiac & Pulmonary Rehab    Staff Present Maxon Suzzette Righter, , Exercise Physiologist;Cherylene Ferrufino Jewel Baize, RN BSN;Margaret Best, MS, Exercise Physiologist    Virtual Visit No    Medication changes reported     No    Fall or balance concerns reported    No    Tobacco Cessation No Change    Current number of cigarettes/nicotine per day     7    Warm-up and Cool-down Performed on first and last piece of equipment    Resistance Training Performed Yes    VAD Patient? No    PAD/SET Patient? No      Pain Assessment   Currently in Pain? No/denies                Social History   Tobacco Use  Smoking Status Some Days   Current packs/day: 0.50   Average packs/day: 0.7 packs/day for 62.6 years (45.3 ttl pk-yrs)   Types: Cigarettes   Start date: 1990  Smokeless Tobacco Never  Tobacco Comments   Working down number of cigarettes a day  Hope to have cessation by Christmas 06/12/2023    Goals Met:  Independence with exercise equipment Exercise tolerated well No report of concerns or symptoms today Strength training completed today  Goals Unmet:  Not Applicable  Comments: Pt able to follow exercise prescription today without complaint.  Will continue to monitor for progression.    Dr. Bethann Punches is Medical Director for [facility] Cardiac Rehabilitation.  Dr. Vida Rigger is Medical Director for [facility] Pulmonary Rehabilitation.

## 2023-02-11 ENCOUNTER — Encounter: Payer: Medicaid Other | Admitting: *Deleted

## 2023-02-11 DIAGNOSIS — Z951 Presence of aortocoronary bypass graft: Secondary | ICD-10-CM | POA: Diagnosis not present

## 2023-02-11 NOTE — Progress Notes (Signed)
Daily Session Note  Patient Details  Name: SEBRENIA ARCEMENT MRN: [id number] Date of Birth: 01-17-1976 Referring Provider:   Flowsheet Row Cardiac Rehab from 01/31/2023 in [facility] Cardiac and Pulmonary Rehab  Referring Provider Dr. [name]       Encounter Date: 02/11/2023  Check In:      Social History   Tobacco Use  Smoking Status Some Days   Current packs/day: 0.50   Average packs/day: 0.7 packs/day for 62.7 years (45.3 ttl pk-yrs)   Types: Cigarettes   Start date: 1990  Smokeless Tobacco Never  Tobacco Comments   Working down number of cigarettes a day  Hope to have cessation by Christmas 06/12/2023    Goals Met:  Independence with exercise equipment Exercise tolerated well No report of concerns or symptoms today  Goals Unmet:  Not Applicable  Comments: Pt able to follow exercise prescription today without complaint.  Will continue to monitor for progression.    Dr. Bethann Punches is Medical Director for [facility] Cardiac Rehabilitation.  Dr. Vida Rigger is Medical Director for [facility] Pulmonary Rehabilitation.

## 2023-02-17 DIAGNOSIS — Z419 Encounter for procedure for purposes other than remedying health state, unspecified: Secondary | ICD-10-CM | POA: Diagnosis not present

## 2023-02-20 ENCOUNTER — Encounter: Payer: 59 | Attending: Family Medicine | Admitting: *Deleted

## 2023-02-20 ENCOUNTER — Encounter: Payer: Self-pay | Admitting: *Deleted

## 2023-02-20 DIAGNOSIS — Z48812 Encounter for surgical aftercare following surgery on the circulatory system: Secondary | ICD-10-CM | POA: Insufficient documentation

## 2023-02-20 DIAGNOSIS — F17210 Nicotine dependence, cigarettes, uncomplicated: Secondary | ICD-10-CM | POA: Diagnosis not present

## 2023-02-20 DIAGNOSIS — Z951 Presence of aortocoronary bypass graft: Secondary | ICD-10-CM | POA: Insufficient documentation

## 2023-02-20 NOTE — Progress Notes (Signed)
Daily Session Note  Patient Details  Name: Lauren [name] MRN: [id number] Date of Birth: 19-Apr-1976 Referring Provider:   Flowsheet Row Cardiac Rehab from 01/31/2023 in [facility] Cardiac and Pulmonary Rehab  Referring Provider Dr. [name]       Encounter Date: 02/20/2023  Check In:  Session Check In - 02/20/23 1647       Check-In   Supervising physician immediately available to respond to emergencies See telemetry face sheet for immediately available ER MD    Location [facility]-Cardiac & Pulmonary Rehab    Staff Present Susann Givens, RN BSN;Joseph [place], RCP,RRT,BSRT;Kelly [place], [place], ACSM CEP, Exercise Physiologist    Virtual Visit No    Medication changes reported     No    Fall or balance concerns reported    No    Tobacco Cessation No Change    Current number of cigarettes/nicotine per day     7    Warm-up and Cool-down Performed on first and last piece of equipment    Resistance Training Performed Yes    VAD Patient? No    PAD/SET Patient? No      Pain Assessment   Currently in Pain? No/denies                Social History   Tobacco Use  Smoking Status Some Days   Current packs/day: 0.50   Average packs/day: 0.7 packs/day for 62.7 years (45.3 ttl pk-yrs)   Types: Cigarettes   Start date: 1990  Smokeless Tobacco Never  Tobacco Comments   Working down number of cigarettes a day  Hope to have cessation by Christmas 06/12/2023    Goals Met:  Independence with exercise equipment Exercise tolerated well No report of concerns or symptoms today Strength training completed today  Goals Unmet:  Not Applicable  Comments: Pt able to follow exercise prescription today without complaint.  Will continue to monitor for progression.    Dr. Bethann Punches is Medical Director for [facility] Cardiac Rehabilitation.  Dr. Vida Rigger is Medical Director for [facility] Pulmonary Rehabilitation.

## 2023-02-20 NOTE — Progress Notes (Signed)
Cardiac Individual Treatment Plan  Patient Details  Name: Lauren [name] MRN: [id number] Date of Birth: Oct 20, 1975 Referring Provider:   Flowsheet Row Cardiac Rehab from 01/31/2023 in [facility] Cardiac and Pulmonary Rehab  Referring Provider Dr. [name]       Initial Encounter Date:  Flowsheet Row Cardiac Rehab from 01/31/2023 in [facility] Cardiac and Pulmonary Rehab  Date 01/31/23       Visit Diagnosis: S/P CABG x 3  Patient's Home Medications on Admission:  Current Outpatient Medications:    acetaminophen (TYLENOL) 500 MG tablet, Take 1 tablet (500 mg total) by mouth every 6 (six) hours as needed., Disp: 30 tablet, Rfl: 0   aspirin EC 81 MG tablet, Take 1 tablet (81 mg total) by mouth daily. Swallow whole., Disp: 90 tablet, Rfl: 3   b complex vitamins capsule, Take 1 capsule by mouth daily., Disp: , Rfl:    carvedilol (COREG) 6.25 MG tablet, TAKE 1 TABLET BY MOUTH TWICE A DAY, Disp: 180 tablet, Rfl: 0   cetirizine (ZYRTEC) 10 MG tablet, Take 10 mg by mouth daily as needed for allergies., Disp: , Rfl:    dapagliflozin propanediol (FARXIGA) 10 MG TABS tablet, Take 1 tablet (10 mg total) by mouth daily., Disp: 90 tablet, Rfl: 1   DULoxetine (CYMBALTA) 60 MG capsule, Take 1 capsule (60 mg total) by mouth 2 (two) times daily., Disp: 180 capsule, Rfl: 1   fluticasone (FLONASE) 50 MCG/ACT nasal spray, Place 1 spray into both nostrils daily as needed for allergies., Disp: , Rfl:    lisinopril (ZESTRIL) 2.5 MG tablet, Take 1 tablet (2.5 mg total) by mouth daily., Disp: 90 tablet, Rfl: 3   nitroGLYCERIN (NITROSTAT) 0.3 MG SL tablet, Place 1 tablet (0.3 mg total) under the tongue every 5 (five) minutes as needed for chest pain., Disp: 90 tablet, Rfl: 12   rosuvastatin (CRESTOR) 40 MG tablet, Take 1 tablet (40 mg total) by mouth daily., Disp: 90 tablet, Rfl: 1   Semaglutide (RYBELSUS) 7 MG TABS, Take 1 tablet (7 mg total) by mouth daily., Disp: 90 tablet, Rfl: 1   traZODone (DESYREL) 100 MG tablet, Take  1 tablet (100 mg total) by mouth at bedtime., Disp: 90 tablet, Rfl: 1   valACYclovir (VALTREX) 1000 MG tablet, TAKE 1 TABLET (1,000 MG TOTAL) BY MOUTH DAILY. TAKE AS NEEDED FOR HERPES OUTBREAK., Disp: 30 tablet, Rfl: 11  Past Medical History: Past Medical History:  Diagnosis Date   Allergic rhinitis    Anxiety    Chronic depression    Diabetes mellitus without complication (HCC)    Elevated LFTs    Genital herpes    GERD (gastroesophageal reflux disease)    Hyperlipidemia    Vitamin D deficiency     Tobacco Use: Social History   Tobacco Use  Smoking Status Some Days   Current packs/day: 0.50   Average packs/day: 0.7 packs/day for 62.7 years (45.3 ttl pk-yrs)   Types: Cigarettes   Start date: 1990  Smokeless Tobacco Never  Tobacco Comments   Working down number of cigarettes a day  Hope to have cessation by Christmas 06/12/2023    Labs: Review Flowsheet  More data exists      Latest Ref Rng & Units 08/31/2022 10/21/2022 10/22/2022 10/23/2022 12/14/2022  Labs for ITP Cardiac and Pulmonary Rehab  Cholestrol <200 mg/dL 782  - - - 956   LDL (calc) mg/dL (calc) 74  - - - 46   HDL-C > OR = 50 mg/dL 35  - - -  40   Trlycerides <150 mg/dL 578  - - - 469   Hemoglobin A1c <5.7 % of total Hgb 10.9  8.5  - - 6.8   PH, Arterial 7.35 - 7.45 - 7.44  7.322  7.340  7.271  7.328  7.301  7.396  7.302  7.387  -  PCO2 arterial 32 - 48 mmHg - 37  34.0  36.7  35.5  43.4  44.2  38.2  48.1  36.4  -  Bicarbonate 20.0 - 28.0 mmol/L - 25.1  17.5  19.7  16.4  22.9  21.8  24.6  23.4  23.7  21.8  -  TCO2 22 - 32 mmol/L - - 18  21  17  24  25  23  25  26  25  26  26  25  23   -  Acid-base deficit 0.0 - 2.0 mmol/L - - 8.0  5.0  10.0  3.0  4.0  1.0  1.0  3.0  3.0  -  O2 Saturation % - 99.5  95  97  98  95  100  76  100  100  96  -    Details       Multiple values from one day are sorted in reverse-chronological order          Exercise Target Goals: Exercise Program Goal: Individual exercise  prescription set using results from initial 6 min walk test and THRR while considering  patient's activity barriers and safety.   Exercise Prescription Goal: Initial exercise prescription builds to 30-45 minutes a day of aerobic activity, 2-3 days per week.  Home exercise guidelines will be given to patient during program as part of exercise prescription that the participant will acknowledge.   Education: Aerobic Exercise: - Group verbal and visual presentation on the components of exercise prescription. Introduces F.I.T.T principle from ACSM for exercise prescriptions.  Reviews F.I.T.T. principles of aerobic exercise including progression. Written material given at graduation. Flowsheet Row Cardiac Rehab from 02/06/2023 in [facility] Cardiac and Pulmonary Rehab  Education need identified 01/31/23       Education: Resistance Exercise: - Group verbal and visual presentation on the components of exercise prescription. Introduces F.I.T.T principle from ACSM for exercise prescriptions  Reviews F.I.T.T. principles of resistance exercise including progression. Written material given at graduation. Flowsheet Row Cardiac Rehab from 02/06/2023 in [facility] Cardiac and Pulmonary Rehab  Date 02/06/23  Educator [facility]  Instruction Review Code 1- [employer] Understanding        Education: Exercise & Equipment Safety: - Individual verbal instruction and demonstration of equipment use and safety with use of the equipment. Flowsheet Row Cardiac Rehab from 02/06/2023 in [facility] Cardiac and Pulmonary Rehab  Date 01/31/23  Educator NT  Instruction Review Code 1- Verbalizes Understanding       Education: Exercise Physiology & General Exercise Guidelines: - Group verbal and written instruction with models to review the exercise physiology of the cardiovascular system and associated critical values. Provides general exercise guidelines with specific guidelines to those with heart or lung disease.    Education:  Flexibility, Balance, Mind/Body Relaxation: - Group verbal and visual presentation with interactive activity on the components of exercise prescription. Introduces F.I.T.T principle from ACSM for exercise prescriptions. Reviews F.I.T.T. principles of flexibility and balance exercise training including progression. Also discusses the mind body connection.  Reviews various relaxation techniques to help reduce and manage stress (i.e. Deep breathing, progressive muscle relaxation, and visualization). Balance handout provided to take home. Written material  given at graduation. Flowsheet Row Cardiac Rehab from 02/06/2023 in [facility] Cardiac and Pulmonary Rehab  Date 02/06/23  Educator [facility]  Instruction Review Code 1- Verbalizes Understanding       Activity Barriers & Risk Stratification:  Activity Barriers & Cardiac Risk Stratification - 01/31/23 1608       Activity Barriers & Cardiac Risk Stratification   Activity Barriers Joint Problems;Other (comment)    Comments R hip pain    Cardiac Risk Stratification High             6 Minute Walk:  6 Minute Walk     Row Name 01/31/23 1607         6 Minute Walk   Phase Initial     Distance 1165 feet     Walk Time 6 minutes     # of Rest Breaks 0     MPH 2.21     METS 3.74     RPE 9     Perceived Dyspnea  0     VO2 Peak 13.11     Symptoms No     Resting HR 74 bpm     Resting BP 116/68     Resting Oxygen Saturation  98 %     Exercise Oxygen Saturation  during 6 min walk 99 %     Max Ex. HR 93 bpm     Max Ex. BP 122/64     2 Minute Post BP 110/64              Oxygen Initial Assessment:   Oxygen Re-Evaluation:   Oxygen Discharge (Final Oxygen Re-Evaluation):   Initial Exercise Prescription:  Initial Exercise Prescription - 01/31/23 1600       Date of Initial Exercise RX and Referring Provider   Date 01/31/23    Referring Provider Dr. [name]      Oxygen   Maintain Oxygen Saturation 88% or higher      Treadmill   MPH  2.2    Grade 0    Minutes 15    METs 2.68      NuStep   Level 2    SPM 80    Minutes 15    METs 3.74      T5 Nustep   Level 2   T6 nustep   SPM 80    Minutes 15    METs 3.74      Prescription Details   Frequency (times per week) 3    Duration Progress to 30 minutes of continuous aerobic without signs/symptoms of physical distress      Intensity   THRR 40-80% of Max Heartrate 113-153    Ratings of Perceived Exertion 11-13    Perceived Dyspnea 0-4      Progression   Progression Continue to progress workloads to maintain intensity without signs/symptoms of physical distress.      Resistance Training   Training Prescription Yes    Weight 4 lb    Reps 10-15             Perform Capillary Blood Glucose checks as needed.  Exercise Prescription Changes:   Exercise Prescription Changes     Row Name 01/31/23 1600 02/11/23 0900           Response to Exercise   Blood Pressure (Admit) 116/68 110/58      Blood Pressure (Exercise) 122/64 150/70      Blood Pressure (Exit) 110/64 94/60      Heart Rate (Admit) 74 bpm  78 bpm      Heart Rate (Exercise) 93 bpm 110 bpm      Heart Rate (Exit) 75 bpm 67 bpm      Oxygen Saturation (Admit) 98 % --      Oxygen Saturation (Exercise) 99 % --      Rating of Perceived Exertion (Exercise) 9 14      Perceived Dyspnea (Exercise) 0 --      Symptoms None none      Comments Results --      Duration -- Progress to 30 minutes of  aerobic without signs/symptoms of physical distress      Intensity -- THRR unchanged        Progression   Progression -- Continue to progress workloads to maintain intensity without signs/symptoms of physical distress.      Average METs -- 2.96        Resistance Training   Training Prescription -- Yes      Weight -- 4      Reps -- 10-15        Interval Training   Interval Training -- No        Treadmill   MPH -- 2.4      Grade -- 4      Minutes -- 15      METs -- 4.16        NuStep   Level  -- 2  T6      Minutes -- 15      METs -- 2        Biostep-RELP   Level -- 2      Minutes -- 15      METs -- 3        Oxygen   Maintain Oxygen Saturation -- 88% or higher               Exercise Comments:   Exercise Comments     Row Name 02/04/23 1725           Exercise Comments First full day of exercise!  Patient was oriented to gym and equipment including functions, settings, policies, and procedures.  Patient's individual exercise prescription and treatment plan were reviewed.  All starting workloads were established based on the results of the 6 minute walk test done at initial orientation visit.  The plan for exercise progression was also introduced and progression will be customized based on patient's performance and goals.  .                Exercise Goals and Review:   Exercise Goals     Row Name 01/31/23 1608             Exercise Goals   Increase Physical Activity Yes       Intervention Develop an individualized exercise prescription for aerobic and resistive training based on initial evaluation findings, risk stratification, comorbidities and participant's personal goals.;Provide advice, education, support and counseling about physical activity/exercise needs.       Expected Outcomes Short Term: Attend rehab on a regular basis to increase amount of physical activity.;Long Term: Add in home exercise to make exercise part of routine and to increase amount of physical activity.;Long Term: Exercising regularly at least 3-5 days a week.       Increase Strength and Stamina Yes       Intervention Provide advice, education, support and counseling about physical activity/exercise needs.;Develop an individualized exercise prescription for aerobic and resistive training based on initial evaluation  findings, risk stratification, comorbidities and participant's personal goals.       Expected Outcomes Short Term: Perform resistance training exercises routinely during  rehab and add in resistance training at home;Short Term: Increase workloads from initial exercise prescription for resistance, speed, and METs.;Long Term: Improve cardiorespiratory fitness, muscular endurance and strength as measured by increased METs and functional capacity ( )       Able to understand and use rate of perceived exertion (RPE) scale Yes       Intervention Provide education and explanation on how to use RPE scale       Expected Outcomes Short Term: Able to use RPE daily in rehab to express subjective intensity level;Long Term:  Able to use RPE to guide intensity level when exercising independently       Able to understand and use Dyspnea scale Yes       Intervention Provide education and explanation on how to use Dyspnea scale       Expected Outcomes Short Term: Able to use Dyspnea scale daily in rehab to express subjective sense of shortness of breath during exertion       Knowledge and understanding of Target Heart Rate Range (THRR) Yes       Intervention Provide education and explanation of THRR including how the numbers were predicted and where they are located for reference       Expected Outcomes Short Term: Able to state/look up THRR;Long Term: Able to use THRR to govern intensity when exercising independently;Short Term: Able to use daily as guideline for intensity in rehab       Able to check pulse independently Yes       Intervention Provide education and demonstration on how to check pulse in carotid and radial arteries.;Review the importance of being able to check your own pulse for safety during independent exercise       Expected Outcomes Short Term: Able to explain why pulse checking is important during independent exercise;Long Term: Able to check pulse independently and accurately       Understanding of Exercise Prescription Yes       Intervention Provide education, explanation, and written materials on patient's individual exercise prescription       Expected  Outcomes Short Term: Able to explain program exercise prescription;Long Term: Able to explain home exercise prescription to exercise independently                Exercise Goals Re-Evaluation :  Exercise Goals Re-Evaluation     Row Name 02/04/23 1726 02/11/23 0946           Exercise Goal Re-Evaluation   Exercise Goals Review Increase Physical Activity;Able to understand and use rate of perceived exertion (RPE) scale;Knowledge and understanding of Target Heart Rate Range (THRR);Understanding of Exercise Prescription;Increase Strength and Stamina;Able to check pulse independently Increase Physical Activity;Increase Strength and Stamina;Understanding of Exercise Prescription      Comments Reviewed RPE  and dyspnea scale, THR and program prescription with pt today.  Pt voiced understanding and was given a copy of goals to take home. Tember is off to a good start in the program. She recently attended her first session and continues to become familiar with the program and her exercise prescription. She has increased her treadmill grade from 0% to 4% and has added the Biostep to her current prescription. We will continue to monitor her progress in the program.      Expected Outcomes Short: Use RPE daily to regulate intensity.  Long:  Follow program prescription in THR. Short: Continue to follow current exercise prescription, and progressively increase workloads. Long: Continue exercise to improve strength and stamina.               Discharge Exercise Prescription (Final Exercise Prescription Changes):  Exercise Prescription Changes - 02/11/23 0900       Response to Exercise   Blood Pressure (Admit) 110/58    Blood Pressure (Exercise) 150/70    Blood Pressure (Exit) 94/60    Heart Rate (Admit) 78 bpm    Heart Rate (Exercise) 110 bpm    Heart Rate (Exit) 67 bpm    Rating of Perceived Exertion (Exercise) 14    Symptoms none    Duration Progress to 30 minutes of  aerobic without  signs/symptoms of physical distress    Intensity THRR unchanged      Progression   Progression Continue to progress workloads to maintain intensity without signs/symptoms of physical distress.    Average METs 2.96      Resistance Training   Training Prescription Yes    Weight 4    Reps 10-15      Interval Training   Interval Training No      Treadmill   MPH 2.4    Grade 4    Minutes 15    METs 4.16      NuStep   Level 2   T6   Minutes 15    METs 2      Biostep-RELP   Level 2    Minutes 15    METs 3      Oxygen   Maintain Oxygen Saturation 88% or higher             Nutrition:  Target Goals: Understanding of nutrition guidelines, daily intake of sodium 1500mg , cholesterol 200mg , calories 30% from fat and 7% or less from saturated fats, daily to have 5 or more servings of fruits and vegetables.  Education: All About Nutrition: -Group instruction provided by verbal, written material, interactive activities, discussions, models, and posters to present general guidelines for heart healthy nutrition including fat, fiber, MyPlate, the role of sodium in heart healthy nutrition, utilization of the nutrition label, and utilization of this knowledge for meal planning. Follow up email sent as well. Written material given at graduation. Flowsheet Row Cardiac Rehab from 02/06/2023 in [facility] Cardiac and Pulmonary Rehab  Education need identified 01/31/23       Biometrics:  Pre Biometrics - 01/31/23 1609       Pre Biometrics   Height 5' 4.5" (1.638 m)    Weight 162 lb 4.8 oz (73.6 kg)    Waist Circumference 37 inches    Hip Circumference 38 inches    Waist to Hip Ratio 0.97 %    BMI (Calculated) 27.44    Single Leg Stand 25 seconds              Nutrition Therapy Plan and Nutrition Goals:   Nutrition Assessments:  MEDIFICTS Score Key: ?70 Need to make dietary changes  40-70 Heart Healthy Diet ? 40 Therapeutic Level Cholesterol Diet  Flowsheet Row Cardiac  Rehab from 01/31/2023 in [facility] Cardiac and Pulmonary Rehab  Picture Your Plate Total Score on Admission 38      Picture Your Plate Scores: <86 Unhealthy dietary pattern with much room for improvement. 41-50 Dietary pattern unlikely to meet recommendations for good health and room for improvement. 51-60 More healthful dietary pattern, with some room for improvement.  >60 Healthy dietary  pattern, although there may be some specific behaviors that could be improved.    Nutrition Goals Re-Evaluation:   Nutrition Goals Discharge (Final Nutrition Goals Re-Evaluation):   Psychosocial: Target Goals: Acknowledge presence or absence of significant depression and/or stress, maximize coping skills, provide positive support system. Participant is able to verbalize types and ability to use techniques and skills needed for reducing stress and depression.   Education: Stress, Anxiety, and Depression - Group verbal and visual presentation to define topics covered.  Reviews how body is impacted by stress, anxiety, and depression.  Also discusses healthy ways to reduce stress and to treat/manage anxiety and depression.  Written material given at graduation.   Education: Sleep Hygiene -Provides group verbal and written instruction about how sleep can affect your health.  Define sleep hygiene, discuss sleep cycles and impact of sleep habits. Review good sleep hygiene tips.    Initial Review & Psychosocial Screening:  Initial Psych Review & Screening - 01/07/23 1027       Initial Review   Current issues with None Identified      Family Dynamics   Good Support System? Yes   daughter, son, mama     Barriers   Psychosocial barriers to participate in program There are no identifiable barriers or psychosocial needs.      Screening Interventions   Interventions Encouraged to exercise;To provide support and resources with identified psychosocial needs;Provide feedback about the scores to participant     Expected Outcomes Short Term goal: Utilizing psychosocial counselor, staff and physician to assist with identification of specific Stressors or current issues interfering with healing process. Setting desired goal for each stressor or current issue identified.;Long Term Goal: Stressors or current issues are controlled or eliminated.;Short Term goal: Identification and review with participant of any Quality of Life or Depression concerns found by scoring the questionnaire.;Long Term goal: The participant improves quality of Life and PHQ9 Scores as seen by post scores and/or verbalization of changes             Quality of Life Scores:   Quality of Life - 01/31/23 1556       Quality of Life   Select Quality of Life      Quality of Life Scores   Health/Function Pre 23.2 %    Socioeconomic Pre 23.63 %    Psych/Spiritual Pre 24.79 %    Family Pre 24 %    GLOBAL Pre 23.73 %            Scores of 19 and below usually indicate a poorer quality of life in these areas.  A difference of  2-3 points is a clinically meaningful difference.  A difference of 2-3 points in the total score of the Quality of Life Index has been associated with significant improvement in overall quality of life, self-image, physical symptoms, and general health in studies assessing change in quality of life.  PHQ-9: Review Flowsheet  More data exists      01/31/2023 01/11/2023 12/14/2022 11/06/2022 09/10/2022  Depression screen PHQ 2/9  Decreased Interest 1 1 1 1  0  Down, Depressed, Hopeless 0 0 1 1 0  PHQ - 2 Score 1 1 2 2  0  Altered sleeping 1 1 0 0 0  Tired, decreased energy 1 1 0 0 0  Change in appetite 1 0 0 0 0  Feeling bad or failure about yourself  0 1 1 0 0  Trouble concentrating 0 0 0 0 0  Moving slowly or fidgety/restless  0 0 0 0 0  Suicidal thoughts 0 0 0 0 0  PHQ-9 Score 4 4 3 2  0  Difficult doing work/chores Not difficult at all Not difficult at all Somewhat difficult Somewhat difficult Not difficult  at all    Details           Interpretation of Total Score  Total Score Depression Severity:  1-4 = Minimal depression, 5-9 = Mild depression, 10-14 = Moderate depression, 15-19 = Moderately severe depression, 20-27 = Severe depression   Psychosocial Evaluation and Intervention:  Psychosocial Evaluation - 01/07/23 1033       Psychosocial Evaluation & Interventions   Interventions Encouraged to exercise with the program and follow exercise prescription    Comments Arihana has no barriers to attending the program.  She has a good support system with her daughter, son and her Mother.  She hopes to learn her heart rate limits and how to manage her heart disease so she can return to hiking and visiting waterfalls.    Expected Outcomes STG Tillman Sers is able to attend all scheduled sessions and is able to see progression with her exercise. Her goal is to have stamina to get back to hiking and visiting waterfalls. LTG Paying has returned to her hikes and visiting warefalls without any cardiac concerns.    Continue Psychosocial Services  Follow up required by staff             Psychosocial Re-Evaluation:   Psychosocial Discharge (Final Psychosocial Re-Evaluation):   Vocational Rehabilitation: Provide vocational rehab assistance to qualifying candidates.   Vocational Rehab Evaluation & Intervention:  Vocational Rehab - 01/07/23 1029       Initial Vocational Rehab Evaluation & Intervention   Assessment shows need for Vocational Rehabilitation No      Vocational Rehab Re-Evaulation   Comments going back to work on 8/5             Education: Education Goals: Education classes will be provided on a variety of topics geared toward better understanding of heart health and risk factor modification. Participant will state understanding/return demonstration of topics presented as noted by education test scores.  Learning Barriers/Preferences:  Learning Barriers/Preferences -  01/07/23 1029       Learning Barriers/Preferences   Learning Barriers None    Learning Preferences None             General Cardiac Education Topics:  AED/CPR: - Group verbal and written instruction with the use of models to demonstrate the basic use of the AED with the basic ABC's of resuscitation.   Anatomy and Cardiac Procedures: - Group verbal and visual presentation and models provide information about basic cardiac anatomy and function. Reviews the testing methods done to diagnose heart disease and the outcomes of the test results. Describes the treatment choices: Medical Management, Angioplasty, or Coronary Bypass Surgery for treating various heart conditions including Myocardial Infarction, Angina, Valve Disease, and Cardiac Arrhythmias.  Written material given at graduation. Flowsheet Row Cardiac Rehab from 02/06/2023 in [facility] Cardiac and Pulmonary Rehab  Education need identified 01/31/23       Medication Safety: - Group verbal and visual instruction to review commonly prescribed medications for heart and lung disease. Reviews the medication, class of the drug, and side effects. Includes the steps to properly store meds and maintain the prescription regimen.  Written material given at graduation.   Intimacy: - Group verbal instruction through game format to discuss how heart and lung disease can affect sexual intimacy.  Written material given at graduation..   Know Your Numbers and Heart Failure: - Group verbal and visual instruction to discuss disease risk factors for cardiac and pulmonary disease and treatment options.  Reviews associated critical values for Overweight/Obesity, Hypertension, Cholesterol, and Diabetes.  Discusses basics of heart failure: signs/symptoms and treatments.  Introduces Heart Failure Zone chart for action plan for heart failure.  Written material given at graduation.   Infection Prevention: - Provides verbal and written material to individual  with discussion of infection control including proper hand washing and proper equipment cleaning during exercise session. Flowsheet Row Cardiac Rehab from 02/06/2023 in [facility] Cardiac and Pulmonary Rehab  Date 01/31/23  Educator NT  Instruction Review Code 1- Verbalizes Understanding       Falls Prevention: - Provides verbal and written material to individual with discussion of falls prevention and safety. Flowsheet Row Cardiac Rehab from 02/06/2023 in [facility] Cardiac and Pulmonary Rehab  Date 01/07/23  Educator SB  Instruction Review Code 1- Verbalizes Understanding       Other: -Provides group and verbal instruction on various topics (see comments)   Knowledge Questionnaire Score:  Knowledge Questionnaire Score - 01/31/23 1556       Knowledge Questionnaire Score   Pre Score 20/26             Core Components/Risk Factors/Patient Goals at Admission:  Personal Goals and Risk Factors at Admission - 01/07/23 1131       Core Components/Risk Factors/Patient Goals on Admission   Number of packs per day Smoking cessation instruction/counseling given:  counseled patient on the dangers of tobacco use, advised patient to stop smoking, and reviewed strategies to maximize success. Jennier is a current tobacco user. Intervention for tobacco cessation was provided at the initial medical review. She was asked about readiness to quit and reported that she is weaning slowly to a Quit date of 06/12/2023 . Patient was advised and educated about tobacco cessation using combination therapy, tobacco cessation classes, quit line, and quit smoking apps. Patient demonstrated understanding of this material. Staff will continue to provide encouragement and follow up with the patient throughout the program.             Education:Diabetes - Individual verbal and written instruction to review signs/symptoms of diabetes, desired ranges of glucose level fasting, after meals and with exercise. Acknowledge  that pre and post exercise glucose checks will be done for 3 sessions at entry of program. Flowsheet Row Cardiac Rehab from 02/06/2023 in [facility] Cardiac and Pulmonary Rehab  Date 01/31/23  Educator NT  Instruction Review Code 1- Verbalizes Understanding       Core Components/Risk Factors/Patient Goals Review:    Core Components/Risk Factors/Patient Goals at Discharge (Final Review):    ITP Comments:  ITP Comments     Row Name 01/07/23 1131 01/31/23 1553 02/04/23 1725 02/20/23 1056     ITP Comments irtual orientation call completed today. shehas an appointment on Date: 01/31/2023  for EP eval and gym Orientation.  Documentation of diagnosis can be found in [facility] 10/19/2022 .   Smoking cessation instruction/counseling given:  counseled patient on the dangers of tobacco use, advised patient to stop smoking, and reviewed strategies to maximize success. Ronnett is a current tobacco user. Intervention for tobacco cessation was provided at the initial medical review. She was asked about readiness to quit and reported that she is weaning slowly to a Quit date of 06/12/2023 . Patient was advised and educated about tobacco cessation using combination therapy, tobacco  cessation classes, quit line, and quit smoking apps. Patient demonstrated understanding of this material. Staff will continue to provide encouragement and follow up with the patient throughout the program. Completed and gym orientation. Initial ITP created and sent for review to Dr. Bethann Punches, Medical Director. First full day of exercise!  Patient was oriented to gym and equipment including functions, settings, policies, and procedures.  Patient's individual exercise prescription and treatment plan were reviewed.  All starting workloads were established based on the results of the 6 minute walk test done at initial orientation visit.  The plan for exercise progression was also introduced and progression will be customized based on patient's  performance and goals.  . 30 Day review completed. Medical Director ITP review done, changes made as directed, and signed approval by Medical Director.     new to program             Comments:

## 2023-02-21 ENCOUNTER — Ambulatory Visit: Payer: 59 | Admitting: Cardiology

## 2023-02-21 ENCOUNTER — Encounter: Payer: Self-pay | Admitting: Cardiology

## 2023-02-21 ENCOUNTER — Ambulatory Visit: Payer: 59 | Attending: Cardiology | Admitting: Cardiology

## 2023-02-21 VITALS — BP 110/64 | HR 84 | Ht 64.0 in | Wt 161.4 lb

## 2023-02-21 DIAGNOSIS — E785 Hyperlipidemia, unspecified: Secondary | ICD-10-CM

## 2023-02-21 DIAGNOSIS — J449 Chronic obstructive pulmonary disease, unspecified: Secondary | ICD-10-CM

## 2023-02-21 DIAGNOSIS — E1169 Type 2 diabetes mellitus with other specified complication: Secondary | ICD-10-CM | POA: Diagnosis not present

## 2023-02-21 DIAGNOSIS — I2510 Atherosclerotic heart disease of native coronary artery without angina pectoris: Secondary | ICD-10-CM

## 2023-02-21 DIAGNOSIS — Z951 Presence of aortocoronary bypass graft: Secondary | ICD-10-CM

## 2023-02-21 DIAGNOSIS — F17200 Nicotine dependence, unspecified, uncomplicated: Secondary | ICD-10-CM

## 2023-02-21 DIAGNOSIS — I10 Essential (primary) hypertension: Secondary | ICD-10-CM | POA: Diagnosis not present

## 2023-02-21 DIAGNOSIS — Z48812 Encounter for surgical aftercare following surgery on the circulatory system: Secondary | ICD-10-CM | POA: Diagnosis not present

## 2023-02-21 MED ORDER — NICOTINE 7 MG/24HR TD PT24: 7.0000 mg | MEDICATED_PATCH | Freq: Every day | TRANSDERMAL | 0 refills | Status: DC

## 2023-02-21 MED ORDER — CARVEDILOL 6.25 MG PO TABS: 6.2500 mg | ORAL_TABLET | Freq: Two times a day (BID) | ORAL | 0 refills | Status: DC

## 2023-02-21 MED ORDER — NICOTINE 14 MG/24HR TD PT24: 14.0000 mg | MEDICATED_PATCH | Freq: Every day | TRANSDERMAL | 0 refills | Status: AC

## 2023-02-21 NOTE — Patient Instructions (Addendum)
Medication Instructions:  Start Taking: Nicotene Patch 14 mg once daily for 21 days           Nicotene Patch 7 mg once daily for 21 days  *If you need a refill on your cardiac medications before your next appointment, please call your pharmacy*   Lab Work: none If you have labs (blood work) drawn today and your tests are completely normal, you will receive your results only by: MyChart Message (if you have MyChart) OR A paper copy in the mail If you have any lab test that is abnormal or we need to change your treatment, we will call you to review the results.   Testing/Procedures: none  Follow-Up: At [facility], you and your health needs are our priority.  As part of our continuing mission to provide you with exceptional heart care, we have created designated Provider Care Teams.  These Care Teams include your primary Cardiologist (physician) and Advanced Practice Providers (APPs -  Physician Assistants and Nurse Practitioners) who all work together to provide you with the care you need, when you need it.  We recommend signing up for the patient portal called "MyChart".  Sign up information is provided on this After Visit Summary.  MyChart is used to connect with patients for Virtual Visits (Telemedicine).  Patients are able to view lab/test results, encounter notes, upcoming appointments, etc.  Non-urgent messages can be sent to your provider as well.   To learn more about what you can do with MyChart, go to [email or web address].    Your next appointment:   Follow up with Charlsie Quest, NP in January Follow up with Dr. [name] in May

## 2023-02-21 NOTE — Progress Notes (Signed)
Daily Session Note  Patient Details  Name: Lauren [name] MRN: [id number] Date of Birth: Dec 13, 1975 Referring Provider:   Flowsheet Row Cardiac Rehab from 01/31/2023 in [facility] Cardiac and Pulmonary Rehab  Referring Provider Dr. [name]       Encounter Date: 02/21/2023  Check In:  Session Check In - 02/21/23 1711       Check-In   Supervising physician immediately available to respond to emergencies See telemetry face sheet for immediately available ER MD    Location [facility]-Cardiac & Pulmonary Rehab    Staff Present Maxon Suzzette Righter, , Exercise Physiologist;Sherice Ijames Jewel Baize, RN BSN;Joseph Reino Kent, RCP,RRT,BSRT    Virtual Visit No    Medication changes reported     No    Fall or balance concerns reported    No    Tobacco Cessation No Change    Current number of cigarettes/nicotine per day     7    Warm-up and Cool-down Performed on first and last piece of equipment    Resistance Training Performed Yes    VAD Patient? No    PAD/SET Patient? No      Pain Assessment   Currently in Pain? No/denies                Social History   Tobacco Use  Smoking Status Some Days   Current packs/day: 0.50   Average packs/day: 0.7 packs/day for 62.7 years (45.3 ttl pk-yrs)   Types: Cigarettes   Start date: 1990  Smokeless Tobacco Never  Tobacco Comments   Working down number of cigarettes a day  Hope to have cessation by Christmas 06/12/2023. 8-9 cigarettes a day.     Goals Met:  Independence with exercise equipment Exercise tolerated well No report of concerns or symptoms today Strength training completed today  Goals Unmet:  Not Applicable  Comments: Pt able to follow exercise prescription today without complaint.  Will continue to monitor for progression.    Dr. Bethann Punches is Medical Director for [facility] Cardiac Rehabilitation.  Dr. Vida Rigger is Medical Director for [facility] Pulmonary Rehabilitation.

## 2023-02-21 NOTE — Progress Notes (Signed)
Cardiology Office Note:  .   Date:  02/22/2023  ID:  Lauren [name], DOB 1975/08/06, MRN [id number] PCP: Danelle Berry, PA-C  [facility] Providers Cardiologist:  Bryan Lemma, MD     Chief Complaint  Patient presents with   Follow-up    2 month follow up visit. Patient is doing well on today. Meds reviewed.    Coronary Artery Disease    Has done well since CABG.  Minor chest wall tenderness, but happy to be in cardiac rehab now.  Walks at least 2 miles a day even on rehab days.   History of Present Illness: .     Lauren [name] is a formerly obese 47 y.o. female smoker with a PMH notable for CAD-CABG x 3, HTN, HLD, DM-2, COPD and GERD who presents here for 34-month follow-up at the request of Danelle Berry, PA-C.  March 2024 evaluated for chest pain concerning for progressive angina => cardiac cath 10/19/2022 revealing 95% distal left main involving ostial LAD and LCx as well as 99% mid RCA and 90% PAB disease CABG x 3: LIMA-LAD, SVG-PDA, SVG-OM (10/22/2022) Back to work 8/5 _ also started [facility] [employer]   Lauren [name] was last seen on November 28, 2022 by Hedy Camara, NP-doing well.  Some discomfort in the right leg where the vein grafts were removed.  Mild hematoma versus seroma.  Likely reabsorbing.  Some restless leg and muscle cramps noted.  Some tightness and pulling around the sternal is wound.  No anginal chest pain or dyspnea at rest or exertion.  Increase your activity levels and walking at least 2 miles a day. - Losing wgt.   Current Meds  Medication Sig   acetaminophen (TYLENOL) 500 MG tablet Take 1 tablet (500 mg total) by mouth every 6 (six) hours as needed.   aspirin EC 81 MG tablet Take 1 tablet (81 mg total) by mouth daily. Swallow whole.   b complex vitamins capsule Take 1 capsule by mouth daily.   cetirizine (ZYRTEC) 10 MG tablet Take 10 mg by mouth daily as needed for allergies.   dapagliflozin propanediol (FARXIGA) 10 MG TABS tablet Take 1 tablet (10 mg total) by mouth  daily.   DULoxetine (CYMBALTA) 60 MG capsule Take 1 capsule (60 mg total) by mouth 2 (two) times daily.   fluticasone (FLONASE) 50 MCG/ACT nasal spray Place 1 spray into both nostrils daily as needed for allergies.   lisinopril (ZESTRIL) 2.5 MG tablet Take 1 tablet (2.5 mg total) by mouth daily.   nicotine (NICODERM CQ) 14 mg/24hr patch Place 1 patch (14 mg total) onto the skin daily for 28 days.   nicotine (NICODERM CQ) 7 mg/24hr patch Place 1 patch (7 mg total) onto the skin daily.   nitroGLYCERIN (NITROSTAT) 0.3 MG SL tablet Place 1 tablet (0.3 mg total) under the tongue every 5 (five) minutes as needed for chest pain.   rosuvastatin (CRESTOR) 40 MG tablet Take 1 tablet (40 mg total) by mouth daily.   Semaglutide (RYBELSUS) 7 MG TABS Take 1 tablet (7 mg total) by mouth daily.   traZODone (DESYREL) 100 MG tablet Take 1 tablet (100 mg total) by mouth at bedtime.   valACYclovir (VALTREX) 1000 MG tablet TAKE 1 TABLET (1,000 MG TOTAL) BY MOUTH DAILY. TAKE AS NEEDED FOR HERPES OUTBREAK.   [DISCONTINUED] carvedilol (COREG) 6.25 MG tablet TAKE 1 TABLET BY MOUTH TWICE A DAY  She had been on 20 mg lisinopril that was reduced to 10 and then stopped.  Then was restarted back on 2.5 mg by her PCP.  She is also on Rybelsus and tolerating well.    Subjective  INTERVAL HISTORY  Lauren [name] presents today here for follow-up stating that she feels well.  She is happy the fact that she is lost weight.  Her energy level is getting better.  She feels pretty healthy.  She is lost a total of 20 pounds as far she can tell since her surgery both from the perioperative weight loss but also with change of diet and increased exercise.  She also just notes her appetite is less prominent probably related to being on Rybelsus.  Her pressures initially dropped low enough where they actually stopped her ACE inhibitor that had been at 20 mg and it was now just recently restarted to 2.5 mg dosing.  She had been delayed starting  cardiac rehab until was cleared by Dr. Cliffton Asters.  She actually just started rehab in early August.  She is definitely enjoying doing the rehab exercising as well as the education classes.  She walks at least 2 miles on the nonrehab days.  Denies any chest pain pressure or dyspnea at rest or exertion.  She was not able to even do minor housework without having angina prior to her CABG and now is able to do vigorous activity without noting any problems.  No PND, orthopnea or edema.    She does note a few skipped beats here and there but nothing overly prominent.  No rapid irregular heartbeats or palpitations. She has really been working on her diet as far as what food she eats and appetite is notably down.  She is happy to see the results of her lipid panel and A1c.  The 1 thing she has not fully been able to get rid of yet is a cigarettes.  She has notably cut down to about 8-10 cigarettes a day from 1.5 packs a day.  Has tried using Nicorette lozenges but thinks that maybe patches would work better.  Wants to quit and just has the cravings.  ROS:   Review of Systems - Negative except some mild morning cough from still smoking.  Mild musculoskeletal aches and pains    Objective   Studies Reviewed: .        RLE U/S 11/21/22 IMPRESSION: 1. No right lower extremity DVT. 2. Elongated complex fluid collection in the right calf likely postop seroma or hematoma.     TTE 10/19/22 1. Left ventricular ejection fraction, by estimation, is 55 to 60%. The left ventricle has normal function. 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 Grade I diastolic  dysfunction (impaired relaxation). The average left ventricular global longitudinal strain is -15.6 %. The global longitudinal strain is abnormal.   2. Right ventricular systolic function is normal. The right ventricular size is normal.   3. The mitral valve is normal in structure. Trivial mitral valve regurgitation. No evidence of mitral stenosis.   4. The aortic valve  is tricuspid. Aortic valve regurgitation is not visualized. No aortic stenosis is present.   5. The IVC is normal in size with greater than 50% respiratory variability, RAP ~ 3 mmHg.      LHC 10/19/22 POST-OPERATIVE DIAGNOSIS:   Low normal EF roughly 50 to 55%.  Difficult to assess wall motion due to ectopy.  Will check 2D echo. Severe multivessel CAD: Distal left main 90% heavily calcified eccentric. Mid LAD eccentric 30%, otherwise the LAD has several small diagonal branches and tapers into  a relatively small caliber vessel that reaches the apex. The LCx courses essentially has a lateral OM branch with several small branches that bifurcates distally.  Inferior branch has a 95% stenosis Mid RCA focal eccentric 95% subtotal occlusion; RCA bifurcates into RPA V and PDA There is a 90% stenosis at the RPA V-RPL 1 bifurcation     PLAN OF CARE: Admit to inpatient  -> will initiate low-dose IV nitroglycerin infusion as well as start IV heparin 2 hours after TR band removal based on distal left main disease.  CVTS consulted-will hope for urgent CABG   Lab Results  Component Value Date   CHOL 107 12/14/2022   HDL 40 (L) 12/14/2022   LDLCALC 46 12/14/2022   TRIG 119 12/14/2022   CHOLHDL 2.7 12/14/2022   Lab Results  Component Value Date   NA 140 12/14/2022   CL 108 12/14/2022   K 4.2 12/14/2022   CO2 23 12/14/2022   BUN 10 12/14/2022   CREATININE 0.52 12/14/2022   EGFR 115 12/14/2022   CALCIUM 9.3 12/14/2022   ALBUMIN 4.6 02/08/2018   GLUCOSE 94 12/14/2022   Lab Results  Component Value Date   HGBA1C 6.8 (H) 12/14/2022    Risk Assessment/Calculations:              Physical Exam:   VS:  BP 110/64 (BP Location: Left Arm, Patient Position: Sitting, Cuff Size: Normal)   Pulse 84   Ht 5\' 4"  (1.626 m)   Wt 161 lb 6.4 oz (73.2 kg)   SpO2 98%   BMI 27.70 kg/m    Wt Readings from Last 3 Encounters:  02/21/23 161 lb 6.4 oz (73.2 kg)  01/31/23 162 lb 4.8 oz (73.6 kg)  01/11/23  161 lb 1.6 oz (73.1 kg)    GEN: Well nourished, well developed in no acute distress; healthy appearing. : Notable weight change.  Help NECK: No JVD; No carotid bruits CARDIAC: Normal S1, S2; RRR, no murmurs, rubs, gallops; mild tenderness along the median sternotomy scar but no pruritus or ecchymosis.  No erythema. RESPIRATORY:  Clear to auscultation without rales, wheezing or rhonchi ; nonlabored, good air movement. ABDOMEN: Soft, non-tender, non-distended EXTREMITIES:  No edema; No deformity      ASSESSMENT AND PLAN: .    Problem List Items Addressed This Visit       Cardiology Problems   Coronary artery disease involving native coronary artery without angina pectoris - Primary (Chronic)    No anginal symptoms since CABG.  Recovered very well.  Exercising on her own as well as with cardiac rehab.  Congratulated her on her lifestyle changes.  She is on moderate dose of carvedilol 6.25 mg twice daily along with lisinopril 2.5 mg daily.  This is in conjunction with 81 mg aspirin and 40 mg rosuvastatin plus Farxiga and Rybelsus.  Working on smoking cessation.      Relevant Medications   carvedilol (COREG) 6.25 MG tablet   Essential hypertension (Chronic)    Blood pressure looks great.  She was restarted on low-dose lisinopril 2.5 mg because of diabetes in conjunction with her carvedilol.  Tolerating well.  No change.  Refill carvedilol.      Relevant Medications   carvedilol (COREG) 6.25 MG tablet   Hyperlipidemia associated with type 2 diabetes mellitus (HCC) (Chronic)    Lipid panel in June looks great with an LDL of 46 on 40 mg of Crestor.  Tolerating well with no myalgias or arthralgias.  With diet, exercise and  notable decrease in overall appetite from Rybelsus has led to significant drop in weight of at least 20 pounds.  This has had an appreciable change on her her A1c and lipids as well.  She remains on Farxiga along with Rybelsus.  Hopefully on her next follow-up A1c of  the level will be below 6.5 closer to 6.      Relevant Medications   carvedilol (COREG) 6.25 MG tablet     Other   Chronic obstructive pulmonary disease, unspecified COPD type (HCC) (Chronic)    Occasional morning cough.  Probably related to the smoking.  Working on smoking cessation. Per PCP.      Relevant Medications   nicotine (NICODERM CQ) 14 mg/24hr patch   nicotine (NICODERM CQ) 7 mg/24hr patch   S/P CABG x 3: LIMA-LAD, SVG-PDA, SVG-OM (Chronic)    Recovered well from her CABG.  Is done remarkably well now enjoying cardiac rehab.  I congratulated her on her recovery and how many steps she is taking in the positive direction.  She seems to have a pretty good new outlook on life and is very grateful.      Tobacco dependence (Chronic)    At this point she wants to stop smoking but is just not been noted to that fully by herself.  Smoking cessation instruction/counseling given:  counseled patient on the dangers of tobacco use, advised patient to stop smoking, and reviewed strategies to maximize success Discussed for 5 minutes.  Will provide prescription for 14 mg nicotine patches for 3 to 4 weeks and then reduce down to the 7 mg.      Relevant Medications   nicotine (NICODERM CQ) 14 mg/24hr patch   nicotine (NICODERM CQ) 7 mg/24hr patch            Dispo: Return in about 4 months (around 06/23/2023) for 3-4 month follow-up, Alternate 4 months with APP, 8 month with MD.  Total time spent: 17 min spent with patient + 14 min spent charting = 31 min     Signed, Marykay Lex, MD, MS Bryan Lemma, M.D., M.S. Interventional Cardiologist  [facility]  Pager # [phone] Phone # [phone] [address] [place], [place] [address]

## 2023-02-22 ENCOUNTER — Encounter: Payer: Self-pay | Admitting: Cardiology

## 2023-02-22 NOTE — Assessment & Plan Note (Signed)
Blood pressure looks great.  She was restarted on low-dose lisinopril 2.5 mg because of diabetes in conjunction with her carvedilol.  Tolerating well.  No change.  Refill carvedilol.

## 2023-02-22 NOTE — Assessment & Plan Note (Signed)
Lipid panel in June looks great with an LDL of 46 on 40 mg of Crestor.  Tolerating well with no myalgias or arthralgias.  With diet, exercise and notable decrease in overall appetite from Rybelsus has led to significant drop in weight of at least 20 pounds.  This has had an appreciable change on her her A1c and lipids as well.  She remains on Farxiga along with Rybelsus.  Hopefully on her next follow-up A1c of the level will be below 6.5 closer to 6.

## 2023-02-22 NOTE — Assessment & Plan Note (Signed)
No anginal symptoms since CABG.  Recovered very well.  Exercising on her own as well as with cardiac rehab.  Congratulated her on her lifestyle changes.  She is on moderate dose of carvedilol 6.25 mg twice daily along with lisinopril 2.5 mg daily.  This is in conjunction with 81 mg aspirin and 40 mg rosuvastatin plus Farxiga and Rybelsus.  Working on smoking cessation.

## 2023-02-22 NOTE — Assessment & Plan Note (Addendum)
At this point she wants to stop smoking but is just not been noted to that fully by herself.  Smoking cessation instruction/counseling given:  counseled patient on the dangers of tobacco use, advised patient to stop smoking, and reviewed strategies to maximize success Discussed for 5 minutes.  Will provide prescription for 14 mg nicotine patches for 3 to 4 weeks and then reduce down to the 7 mg.

## 2023-02-22 NOTE — Assessment & Plan Note (Signed)
Recovered well from her CABG.  Is done remarkably well now enjoying cardiac rehab.  I congratulated her on her recovery and how many steps she is taking in the positive direction.  She seems to have a pretty good new outlook on life and is very grateful.

## 2023-02-22 NOTE — Assessment & Plan Note (Signed)
Occasional morning cough.  Probably related to the smoking.  Working on smoking cessation. Per PCP.

## 2023-02-25 ENCOUNTER — Encounter: Payer: 59 | Admitting: *Deleted

## 2023-02-25 DIAGNOSIS — Z951 Presence of aortocoronary bypass graft: Secondary | ICD-10-CM

## 2023-02-25 DIAGNOSIS — Z48812 Encounter for surgical aftercare following surgery on the circulatory system: Secondary | ICD-10-CM | POA: Diagnosis not present

## 2023-02-25 NOTE — Progress Notes (Signed)
Daily Session Note  Patient Details  Name: Lauren [name] MRN: [id number] Date of Birth: 1975/11/22 Referring Provider:   Flowsheet Row Cardiac Rehab from 01/31/2023 in [facility] Cardiac and Pulmonary Rehab  Referring Provider Dr. [name]       Encounter Date: 02/25/2023  Check In:  Session Check In - 02/25/23 1727       Check-In   Supervising physician immediately available to respond to emergencies See telemetry face sheet for immediately available ER MD    Location [facility]-Cardiac & Pulmonary Rehab    Staff Present Cora Collum, RN, BSN, CCRP;Joseph [place], RCP,RRT,BSRT;Kelly [place], [place], ACSM CEP, Exercise Physiologist    Virtual Visit No    Medication changes reported     No    Fall or balance concerns reported    No    Warm-up and Cool-down Performed on first and last piece of equipment    Resistance Training Performed Yes    VAD Patient? No    PAD/SET Patient? No      Pain Assessment   Currently in Pain? No/denies                Social History   Tobacco Use  Smoking Status Some Days   Current packs/day: 0.50   Average packs/day: 0.7 packs/day for 62.7 years (45.3 ttl pk-yrs)   Types: Cigarettes   Start date: 1990  Smokeless Tobacco Never  Tobacco Comments   Working down number of cigarettes a day  Hope to have cessation by Christmas 06/12/2023. 8-9 cigarettes a day.     Goals Met:  Independence with exercise equipment Exercise tolerated well No report of concerns or symptoms today  Goals Unmet:  Not Applicable  Comments: Pt able to follow exercise prescription today without complaint.  Will continue to monitor for progression.    Dr. Bethann Punches is Medical Director for [facility] Cardiac Rehabilitation.  Dr. Vida Rigger is Medical Director for [facility] Pulmonary Rehabilitation.

## 2023-02-27 ENCOUNTER — Encounter: Payer: 59 | Admitting: *Deleted

## 2023-02-27 DIAGNOSIS — Z951 Presence of aortocoronary bypass graft: Secondary | ICD-10-CM

## 2023-02-27 DIAGNOSIS — Z48812 Encounter for surgical aftercare following surgery on the circulatory system: Secondary | ICD-10-CM | POA: Diagnosis not present

## 2023-02-27 NOTE — Progress Notes (Signed)
Daily Session Note  Patient Details  Name: Lauren [name] MRN: [id number] Date of Birth: Mar 13, 1976 Referring Provider:   Flowsheet Row Cardiac Rehab from 01/31/2023 in [facility] Cardiac and Pulmonary Rehab  Referring Provider Dr. [name]       Encounter Date: 02/27/2023  Check In:  Session Check In - 02/27/23 1652       Check-In   Supervising physician immediately available to respond to emergencies See telemetry face sheet for immediately available ER MD    Location [facility]-Cardiac & Pulmonary Rehab    Staff Present Susann Givens, RN BSN;Joseph [place], RCP,RRT,BSRT;Maxon [place] BS, , Exercise Physiologist    Virtual Visit No    Medication changes reported     No    Fall or balance concerns reported    No    Tobacco Cessation No Change    Current number of cigarettes/nicotine per day     7    Warm-up and Cool-down Performed on first and last piece of equipment    Resistance Training Performed Yes    VAD Patient? No    PAD/SET Patient? No      Pain Assessment   Currently in Pain? No/denies                Social History   Tobacco Use  Smoking Status Some Days   Current packs/day: 0.50   Average packs/day: 0.7 packs/day for 62.7 years (45.3 ttl pk-yrs)   Types: Cigarettes   Start date: 1990  Smokeless Tobacco Never  Tobacco Comments   Working down number of cigarettes a day  Hope to have cessation by Christmas 06/12/2023. 8-9 cigarettes a day.     Goals Met:  Independence with exercise equipment Exercise tolerated well No report of concerns or symptoms today Strength training completed today  Goals Unmet:  Not Applicable  Comments: Pt able to follow exercise prescription today without complaint.  Will continue to monitor for progression.    Dr. Bethann Punches is Medical Director for [facility] Cardiac Rehabilitation.  Dr. Vida Rigger is Medical Director for [facility] Pulmonary Rehabilitation.

## 2023-02-28 ENCOUNTER — Encounter: Payer: 59 | Admitting: *Deleted

## 2023-02-28 DIAGNOSIS — Z951 Presence of aortocoronary bypass graft: Secondary | ICD-10-CM

## 2023-02-28 DIAGNOSIS — Z48812 Encounter for surgical aftercare following surgery on the circulatory system: Secondary | ICD-10-CM | POA: Diagnosis not present

## 2023-02-28 NOTE — Progress Notes (Signed)
Daily Session Note  Patient Details  Name: Lauren [name] MRN: [id number] Date of Birth: 01/01/1976 Referring Provider:   Flowsheet Row Cardiac Rehab from 01/31/2023 in [facility] Cardiac and Pulmonary Rehab  Referring Provider Dr. [name]       Encounter Date: 02/28/2023  Check In:  Session Check In - 02/28/23 1716       Check-In   Supervising physician immediately available to respond to emergencies See telemetry face sheet for immediately available ER MD    Location [facility]-Cardiac & Pulmonary Rehab    Staff Present Maxon Suzzette Righter, , Exercise Physiologist;Cadan Maggart Jewel Baize, RN BSN;Joseph Reino [place], RCP,RRT,BSRT    Virtual Visit No    Medication changes reported     No    Fall or balance concerns reported    No    Tobacco Cessation No Change    Current number of cigarettes/nicotine per day     7    Warm-up and Cool-down Performed on first and last piece of equipment    Resistance Training Performed Yes    VAD Patient? No    PAD/SET Patient? No      Pain Assessment   Currently in Pain? No/denies                Social History   Tobacco Use  Smoking Status Some Days   Current packs/day: 0.50   Average packs/day: 0.7 packs/day for 62.7 years (45.3 ttl pk-yrs)   Types: Cigarettes   Start date: 1990  Smokeless Tobacco Never  Tobacco Comments   Working down number of cigarettes a day  Hope to have cessation by Christmas 06/12/2023. 8-9 cigarettes a day.     Goals Met:  Independence with exercise equipment Exercise tolerated well No report of concerns or symptoms today Strength training completed today  Goals Unmet:  Not Applicable  Comments: Pt able to follow exercise prescription today without complaint.  Will continue to monitor for progression.    Dr. Bethann Punches is Medical Director for [facility] Cardiac Rehabilitation.  Dr. Vida Rigger is Medical Director for [facility] Pulmonary Rehabilitation.

## 2023-03-04 ENCOUNTER — Encounter: Payer: 59 | Admitting: *Deleted

## 2023-03-04 DIAGNOSIS — Z951 Presence of aortocoronary bypass graft: Secondary | ICD-10-CM

## 2023-03-04 DIAGNOSIS — Z48812 Encounter for surgical aftercare following surgery on the circulatory system: Secondary | ICD-10-CM | POA: Diagnosis not present

## 2023-03-04 NOTE — Progress Notes (Signed)
Daily Session Note  Patient Details  Name: Lauren [name] MRN: [id number] Date of Birth: 11/05/1975 Referring Provider:   Flowsheet Row Cardiac Rehab from 01/31/2023 in [facility] Cardiac and Pulmonary Rehab  Referring Provider Dr. [name]       Encounter Date: 03/04/2023  Check In:  Session Check In - 03/04/23 1718       Check-In   Supervising physician immediately available to respond to emergencies See telemetry face sheet for immediately available ER MD    Location [facility]-Cardiac & Pulmonary Rehab    Staff Present Susann Givens, RN Mabeline Caras, BS, ACSM CEP, Exercise Physiologist;Joseph Reino [place], [place]    Virtual Visit No    Medication changes reported     No    Fall or balance concerns reported    No    Tobacco Cessation No Change    Current number of cigarettes/nicotine per day     7    Warm-up and Cool-down Performed on first and last piece of equipment    Resistance Training Performed Yes    VAD Patient? No    PAD/SET Patient? No      Pain Assessment   Currently in Pain? No/denies                Social History   Tobacco Use  Smoking Status Some Days   Current packs/day: 0.50   Average packs/day: 0.7 packs/day for 62.7 years (45.4 ttl pk-yrs)   Types: Cigarettes   Start date: 1990  Smokeless Tobacco Never  Tobacco Comments   Working down number of cigarettes a day  Hope to have cessation by Christmas 06/12/2023. 8-9 cigarettes a day.     Goals Met:  Independence with exercise equipment Exercise tolerated well Personal goals reviewed No report of concerns or symptoms today Strength training completed today  Goals Unmet:  Not Applicable  Comments: Pt able to follow exercise prescription today without complaint.  Will continue to monitor for progression.  Reviewed home exercise with pt today.  Pt plans to walk and go to [employer] for exercise.  Reviewed THR, pulse, RPE, sign and symptoms, pulse oximetery and when to call 911 or MD.  Also  discussed weather considerations and indoor options.  Pt voiced understanding.     Dr. Bethann Punches is Medical Director for [facility] Cardiac Rehabilitation.  Dr. Vida Rigger is Medical Director for [facility] Pulmonary Rehabilitation.

## 2023-03-18 ENCOUNTER — Encounter: Payer: Self-pay | Admitting: Physician Assistant

## 2023-03-18 ENCOUNTER — Ambulatory Visit (INDEPENDENT_AMBULATORY_CARE_PROVIDER_SITE_OTHER): Payer: 59 | Admitting: Physician Assistant

## 2023-03-18 VITALS — BP 100/62 | HR 101 | Temp 98.2°F | Resp 16 | Ht 64.0 in | Wt 162.9 lb

## 2023-03-18 DIAGNOSIS — J449 Chronic obstructive pulmonary disease, unspecified: Secondary | ICD-10-CM | POA: Diagnosis not present

## 2023-03-18 DIAGNOSIS — Z7984 Long term (current) use of oral hypoglycemic drugs: Secondary | ICD-10-CM

## 2023-03-18 DIAGNOSIS — E1169 Type 2 diabetes mellitus with other specified complication: Secondary | ICD-10-CM | POA: Diagnosis not present

## 2023-03-18 DIAGNOSIS — I10 Essential (primary) hypertension: Secondary | ICD-10-CM

## 2023-03-18 DIAGNOSIS — Z23 Encounter for immunization: Secondary | ICD-10-CM

## 2023-03-18 DIAGNOSIS — E785 Hyperlipidemia, unspecified: Secondary | ICD-10-CM

## 2023-03-18 DIAGNOSIS — E1165 Type 2 diabetes mellitus with hyperglycemia: Secondary | ICD-10-CM

## 2023-03-18 DIAGNOSIS — E559 Vitamin D deficiency, unspecified: Secondary | ICD-10-CM

## 2023-03-18 NOTE — Assessment & Plan Note (Signed)
Chronic, historic condition She is not using an inhaler regimen currently - states symptoms are pretty minimal at this time Encouraged smoking cessation efforts Will get flu vaccine on board Follow up in 3 months or sooner if concerns arise

## 2023-03-18 NOTE — Assessment & Plan Note (Signed)
Chronic, historic condition Appears well controlled on current regimen comprised of Lisinopril 2.5 mg PO every day and Carvedilol 6.25 mg PO BID  Continue current regimen Continue collaboration with Cardiology  Follow up in 3 months or sooner if concerns arise

## 2023-03-18 NOTE — Assessment & Plan Note (Signed)
Recheck levels today Results to dictate further management

## 2023-03-18 NOTE — Assessment & Plan Note (Signed)
Chronic, historic condition Appears well managed on current regimen of Crestor 40 mg PO every day  Continue current regimen Recheck lipids at follow up  Follow up in 3 months or sooner if concerns arise

## 2023-03-18 NOTE — Assessment & Plan Note (Signed)
Chronic, historic condition Most recent A1c was 6.8  She is taking Rybelsus 7 mg PO every day, Farxiga 10 mg PO every day  Recheck A1c today. Results to dictate further management  Continue current regimen for now. Encouraged dietary changes as these have likely led to further improvement  She is reporting nausea with Rybelsus on empty stomach- may need to allow small snack with admin to secure adherence to regimen Follow up in 3 months or sooner if concerns arise

## 2023-03-18 NOTE — Progress Notes (Signed)
Established Patient Office Visit  Name: Lauren [name]   MRN: [id number]    DOB: 05-Mar-1976   Date:03/18/2023  Today's Provider: Jacquelin Hawking, MHS, PA-C Introduced myself to the patient as a PA-C and provided education on APPs in clinical practice.         Subjective  Chief Complaint  Chief Complaint  Patient presents with   Diabetes    HPI   Diabetes, Type 2 - Last A1c 6.8 - Medications: Rybelsus 7 mg PO every day, Farxiga 10 mg PO every day,  - Compliance: Good  - Checking BG at home:  She is checking at home, consistently fasting is 90s-100s  - Diet: She is trying to avoid sugars and drinks. Trying to eat smaller portions and avoid carbs - Exercise: She is participating in cardiab rehab  - Eye exam: UTD - Foot exam: UTD - Microalbumin: UTD - Statin: on statin  - PNA vaccine: NA She reports she  is still feeling nauseous when she takes the Rybelsus and would like to try taking with food   HYPERTENSION / HYPERLIPIDEMIA Satisfied with current treatment? yes Duration of hypertension: chronic BP monitoring frequency: not checking BP range:  BP medication side effects: no Past BP meds: lisinopril, carvedilol  Duration of hyperlipidemia: years Cholesterol medication side effects: no Cholesterol supplements: none Past cholesterol medications: rosuvastatin (crestor) Medication compliance: good compliance Aspirin: yes Recent stressors: no Recurrent headaches: no Visual changes: no Palpitations: no Dyspnea: no Chest pain: no Lower extremity edema: no Dizzy/lightheaded: no   COPD  She reports her breathing is "okay"  Denies coughing She does not use inhaler regimen right now- reports bronchitis with weather changes and this is typically when she gets inhaler    Anxiety/ Depression   Her mood is "so-so" she reports getting aggravated easily  Thinks her smoking cessation efforts have more to do with the irritability  She is still taking Cymbalta and  thinks it is at a good dose for her      03/18/2023    2:26 PM 01/31/2023    4:01 PM 01/11/2023    8:59 AM 12/14/2022    2:26 PM 11/06/2022    8:17 AM  Depression screen PHQ 2/9  Decreased Interest 0 1 1 1 1   Down, Depressed, Hopeless 0 0 0 1 1  PHQ - 2 Score 0 1 1 2 2   Altered sleeping 0 1 1 0 0  Tired, decreased energy 0 1 1 0 0  Change in appetite 0 1 0 0 0  Feeling bad or failure about yourself  0 0 1 1 0  Trouble concentrating 0 0 0 0 0  Moving slowly or fidgety/restless 0 0 0 0 0  Suicidal thoughts 0 0 0 0 0  PHQ-9 Score 0 4 4 3 2   Difficult doing work/chores Not difficult at all Not difficult at all Not difficult at all Somewhat difficult Somewhat difficult      03/18/2023    2:30 PM 01/11/2023    9:02 AM 12/14/2022    2:26 PM 11/06/2022    8:25 AM  GAD 7 : Generalized Anxiety Score  Nervous, Anxious, on Edge 0 1 3 0  Control/stop worrying 0 1 3 0  Worry too much - different things 0 1 3 0  Trouble relaxing 0 1 3 0  Restless 0 1 0 0  Easily annoyed or irritable 0 2 1 0  Afraid - awful might happen 0  1 1 0  Total GAD 7 Score 0 8 14 0  Anxiety Difficulty Not difficult at all Somewhat difficult Somewhat difficult Not difficult at all         Patient Active Problem List   Diagnosis Date Noted   S/P CABG x 3: LIMA-LAD, SVG-PDA, SVG-OM 10/22/2022   Coronary artery disease involving native coronary artery without angina pectoris 10/19/2022   Chronic obstructive pulmonary disease, unspecified COPD type (HCC) 10/15/2022   Type 2 diabetes mellitus with hyperglycemia, without long-term current use of insulin (HCC) 08/18/2021   Polyp of transverse colon    Polyp of sigmoid colon    Essential hypertension 05/19/2018   Tobacco dependence 05/18/2018   Allergic rhinitis, seasonal 04/23/2017   Genital herpes 04/23/2017   MDD (major depressive disorder) 05/17/2016   Cholesteatoma of attic of right ear 12/05/2015   GERD (gastroesophageal reflux disease) 11/29/2015   Vitamin D  deficiency 12/13/2014   Anxiety and depression 12/13/2014   Hyperlipidemia associated with type 2 diabetes mellitus (HCC) 05/26/2014   Smoker 05/26/2014    Past Surgical History:  Procedure Laterality Date   APPENDECTOMY     CESAREAN SECTION     x 4    COLONOSCOPY     COLONOSCOPY WITH PROPOFOL N/A 07/27/2020   Procedure: COLONOSCOPY WITH PROPOFOL;  Surgeon: Pasty Spillers, MD;  Location: [facility] ENDOSCOPY;  Service: Endoscopy;  Laterality: N/A;  COVID POSITIVE 07/05/2020   CORONARY ARTERY BYPASS GRAFT N/A 10/22/2022   Procedure: CORONARY ARTERY BYPASS GRAFTING (CABG) TIMES THREE USING THE LEFT INTERNAL MAMMARY ARTERY (LIMA) AND ENDOSCOPICALLY HARVESTED RIGHT GREATER SAPHENOUS VEIN;  Surgeon: Corliss Skains, MD;  Location: [facility] OR;  Service: Open Heart Surgery; x 3: LIMA-LAD, SVG-OM, SVG-PDA.   HERNIA REPAIR  09/25/2011   LEFT HEART CATH AND CORONARY ANGIOGRAPHY N/A 10/19/2022   Procedure: LEFT HEART CATH AND CORONARY ANGIOGRAPHY;  Surgeon: Marykay Lex, MD;  Location: [facility] INVASIVE CV LAB;  Service: Cardiovascular; EF 50 to 55%. dLM 90% -< 95% ost LAD & LCx. 99% mRCA & 90% rPAV. => CABG.   TEE WITHOUT CARDIOVERSION N/A 10/22/2022   Procedure: TRANSESOPHAGEAL ECHOCARDIOGRAM;  Surgeon: Corliss Skains, MD;  Location: [facility] OR;  Service: Open Heart Surgery;  Laterality: N/A;   TRANSTHORACIC ECHOCARDIOGRAM  10/19/2022   EF 55 to 60%.  No RWMA.  GR 1 DD.  Normal RV size and function.  Normal AoV, MV.  Normal RAP.   TUBAL LIGATION     TYMPANOMASTOIDECTOMY Right 12/07/2015   Procedure: TYMPANOMASTOIDECTOMY;  Surgeon: Geanie Logan, MD;  Location: [facility] ORS;  Service: ENT;  Laterality: Right;    Family History  Problem Relation Age of Onset   Hypertension Mother    Hyperlipidemia Mother    Diabetes Mother    Arrhythmia Father 36       A-fib   Alcohol abuse Father    Breast cancer Maternal Aunt    Heart disease Brother 67       stent x 1    Alcoholism Brother    Asthma Son     Asperger's syndrome Son     Social History   Tobacco Use   Smoking status: Some Days    Current packs/day: 0.50    Average packs/day: 0.7 packs/day for 62.7 years (45.4 ttl pk-yrs)    Types: Cigarettes    Start date: 67   Smokeless tobacco: Never   Tobacco comments:    Working down number of cigarettes a day  Hope to have cessation  by Christmas 06/12/2023. 8-9 cigarettes a day.   Substance Use Topics   Alcohol use: Not Currently    Alcohol/week: 0.0 standard drinks of alcohol     Current Outpatient Medications:    acetaminophen (TYLENOL) 500 MG tablet, Take 1 tablet (500 mg total) by mouth every 6 (six) hours as needed., Disp: 30 tablet, Rfl: 0   aspirin EC 81 MG tablet, Take 1 tablet (81 mg total) by mouth daily. Swallow whole., Disp: 90 tablet, Rfl: 3   b complex vitamins capsule, Take 1 capsule by mouth daily., Disp: , Rfl:    carvedilol (COREG) 6.25 MG tablet, Take 1 tablet (6.25 mg total) by mouth 2 (two) times daily., Disp: 180 tablet, Rfl: 0   cetirizine (ZYRTEC) 10 MG tablet, Take 10 mg by mouth daily as needed for allergies., Disp: , Rfl:    dapagliflozin propanediol (FARXIGA) 10 MG TABS tablet, Take 1 tablet (10 mg total) by mouth daily., Disp: 90 tablet, Rfl: 1   DULoxetine (CYMBALTA) 60 MG capsule, Take 1 capsule (60 mg total) by mouth 2 (two) times daily., Disp: 180 capsule, Rfl: 1   fluticasone (FLONASE) 50 MCG/ACT nasal spray, Place 1 spray into both nostrils daily as needed for allergies., Disp: , Rfl:    nicotine (NICODERM CQ) 14 mg/24hr patch, Place 1 patch (14 mg total) onto the skin daily for 28 days., Disp: 21 patch, Rfl: 0   nicotine (NICODERM CQ) 7 mg/24hr patch, Place 1 patch (7 mg total) onto the skin daily., Disp: 21 patch, Rfl: 0   nitroGLYCERIN (NITROSTAT) 0.3 MG SL tablet, Place 1 tablet (0.3 mg total) under the tongue every 5 (five) minutes as needed for chest pain., Disp: 90 tablet, Rfl: 12   rosuvastatin (CRESTOR) 40 MG tablet, Take 1 tablet (40 mg  total) by mouth daily., Disp: 90 tablet, Rfl: 1   Semaglutide (RYBELSUS) 7 MG TABS, Take 1 tablet (7 mg total) by mouth daily., Disp: 90 tablet, Rfl: 1   traZODone (DESYREL) 100 MG tablet, Take 1 tablet (100 mg total) by mouth at bedtime., Disp: 90 tablet, Rfl: 1   valACYclovir (VALTREX) 1000 MG tablet, TAKE 1 TABLET (1,000 MG TOTAL) BY MOUTH DAILY. TAKE AS NEEDED FOR HERPES OUTBREAK., Disp: 30 tablet, Rfl: 11   lisinopril (ZESTRIL) 2.5 MG tablet, Take 1 tablet (2.5 mg total) by mouth daily., Disp: 90 tablet, Rfl: 3  Allergies  Allergen Reactions   Cephalexin Other (See Comments)    Secondary yeast infection   Metformin And Related Nausea And Vomiting    With all doses and regular and extended release forms    I personally reviewed active problem list, medication list, allergies, health maintenance, notes from last encounter, lab results with the patient/caregiver today.   ROS  See HPI for relevant ROS   Objective  Vitals:   03/18/23 1430  BP: 100/62  Pulse: (!) 101  Resp: 16  Temp: 98.2 F (36.8 C)  TempSrc: Oral  SpO2: 97%  Weight: 162 lb 14.4 oz (73.9 kg)  Height: 5\' 4"  (1.626 m)    Body mass index is 27.96 kg/m.  Physical Exam Vitals reviewed.  Constitutional:      General: She is awake.     Appearance: Normal appearance. She is well-developed and well-groomed.  HENT:     Head: Normocephalic and atraumatic.  Cardiovascular:     Rate and Rhythm: Normal rate and regular rhythm.     Pulses: Normal pulses.  Radial pulses are 2+ on the right side and 2+ on the left side.     Heart sounds: Normal heart sounds.  Pulmonary:     Effort: Pulmonary effort is normal.     Breath sounds: Normal breath sounds. No decreased air movement. No decreased breath sounds, wheezing, rhonchi or rales.  Musculoskeletal:     Cervical back: Normal range of motion.     Right lower leg: No edema.     Left lower leg: No edema.  Skin:    General: Skin is warm and dry.   Neurological:     General: No focal deficit present.     Mental Status: She is alert and oriented to person, place, and time.  Psychiatric:        Mood and Affect: Mood normal.        Behavior: Behavior normal. Behavior is cooperative.        Thought Content: Thought content normal.        Judgment: Judgment normal.      Recent Results (from the past 2160 hour(s))  Glucose, capillary     Status: None   Collection Time: 02/04/23  5:24 PM  Result Value Ref Range   Glucose-Capillary 99 70 - 99 mg/dL    Comment: Glucose reference range applies only to samples taken after fasting for at least 8 hours.  Glucose, capillary     Status: None   Collection Time: 02/04/23  6:18 PM  Result Value Ref Range   Glucose-Capillary 94 70 - 99 mg/dL    Comment: Glucose reference range applies only to samples taken after fasting for at least 8 hours.  Glucose, capillary     Status: Abnormal   Collection Time: 02/06/23  5:08 PM  Result Value Ref Range   Glucose-Capillary 145 (H) 70 - 99 mg/dL    Comment: Glucose reference range applies only to samples taken after fasting for at least 8 hours.  Glucose, capillary     Status: None   Collection Time: 02/06/23  6:19 PM  Result Value Ref Range   Glucose-Capillary 90 70 - 99 mg/dL    Comment: Glucose reference range applies only to samples taken after fasting for at least 8 hours.  Glucose, capillary     Status: Abnormal   Collection Time: 02/07/23  5:18 PM  Result Value Ref Range   Glucose-Capillary 102 (H) 70 - 99 mg/dL    Comment: Glucose reference range applies only to samples taken after fasting for at least 8 hours.  Glucose, capillary     Status: None   Collection Time: 02/07/23  6:18 PM  Result Value Ref Range   Glucose-Capillary 88 70 - 99 mg/dL    Comment: Glucose reference range applies only to samples taken after fasting for at least 8 hours.     PHQ2/9:    03/18/2023    2:26 PM 01/31/2023    4:01 PM 01/11/2023    8:59 AM 12/14/2022     2:26 PM 11/06/2022    8:17 AM  Depression screen PHQ 2/9  Decreased Interest 0 1 1 1 1   Down, Depressed, Hopeless 0 0 0 1 1  PHQ - 2 Score 0 1 1 2 2   Altered sleeping 0 1 1 0 0  Tired, decreased energy 0 1 1 0 0  Change in appetite 0 1 0 0 0  Feeling bad or failure about yourself  0 0 1 1 0  Trouble concentrating 0 0 0 0 0  Moving slowly or fidgety/restless 0 0 0 0 0  Suicidal thoughts 0 0 0 0 0  PHQ-9 Score 0 4 4 3 2   Difficult doing work/chores Not difficult at all Not difficult at all Not difficult at all Somewhat difficult Somewhat difficult      Fall Risk:    03/18/2023    2:26 PM 01/11/2023    8:59 AM 01/07/2023   10:22 AM 12/14/2022    2:20 PM 11/06/2022    8:17 AM  Fall Risk   Falls in the past year? 0 0 0 1 1  Number falls in past yr: 0 0 0 0 0  Injury with Fall? 0 0 0 1 1  Risk for fall due to : No Fall Risks  Medication side effect Impaired balance/gait Impaired balance/gait  Follow up Falls prevention discussed;Education provided;Falls evaluation completed  Falls evaluation completed;Education provided;Falls prevention discussed Falls prevention discussed;Education provided;Falls evaluation completed Falls prevention discussed;Education provided;Falls evaluation completed      Functional Status Survey: Is the patient deaf or have difficulty hearing?: No Does the patient have difficulty seeing, even when wearing glasses/contacts?: No Does the patient have difficulty concentrating, remembering, or making decisions?: No Does the patient have difficulty walking or climbing stairs?: No Does the patient have difficulty dressing or bathing?: No Does the patient have difficulty doing errands alone such as visiting a doctor's office or shopping?: No    Assessment & Plan  Problem List Items Addressed This Visit       Cardiovascular and Mediastinum   Essential hypertension (Chronic)    Chronic, historic condition Appears well controlled on current regimen  comprised of Lisinopril 2.5 mg PO every day and Carvedilol 6.25 mg PO BID  Continue current regimen Continue collaboration with Cardiology  Follow up in 3 months or sooner if concerns arise        Relevant Orders   COMPLETE METABOLIC PANEL WITH GFR   CBC w/Diff/Platelet     Respiratory   Chronic obstructive pulmonary disease, unspecified COPD type (HCC) - Primary (Chronic)    Chronic, historic condition She is not using an inhaler regimen currently - states symptoms are pretty minimal at this time Encouraged smoking cessation efforts Will get flu vaccine on board Follow up in 3 months or sooner if concerns arise          Endocrine   Hyperlipidemia associated with type 2 diabetes mellitus (HCC) (Chronic)    Chronic, historic condition Appears well managed on current regimen of Crestor 40 mg PO every day  Continue current regimen Recheck lipids at follow up  Follow up in 3 months or sooner if concerns arise        Type 2 diabetes mellitus with hyperglycemia, without long-term current use of insulin (HCC) (Chronic)    Chronic, historic condition Most recent A1c was 6.8  She is taking Rybelsus 7 mg PO every day, Farxiga 10 mg PO every day  Recheck A1c today. Results to dictate further management  Continue current regimen for now. Encouraged dietary changes as these have likely led to further improvement  She is reporting nausea with Rybelsus on empty stomach- may need to allow small snack with admin to secure adherence to regimen Follow up in 3 months or sooner if concerns arise        Relevant Orders   HgB A1c     Other   Vitamin D deficiency    Recheck levels today Results to dictate further management  Relevant Orders   Vitamin D (25 hydroxy)   Other Visit Diagnoses     Need for influenza vaccination       Relevant Orders   Flu vaccine trivalent PF, 6mos and older(Flulaval,Afluria,Fluarix,Fluzone) (Completed)        Return in about 3 months  (around 06/17/2023) for DM, HLD, HTN, Depression.   I, Ehtan Delfavero E Trevonn Hallum, PA-C, have reviewed all documentation for this visit. The documentation on 03/18/23 for the exam, diagnosis, procedures, and orders are all accurate and complete.   Jacquelin Hawking, MHS, PA-C [facility]

## 2023-03-19 DIAGNOSIS — Z419 Encounter for procedure for purposes other than remedying health state, unspecified: Secondary | ICD-10-CM | POA: Diagnosis not present

## 2023-03-19 LAB — CBC WITH DIFFERENTIAL/PLATELET
Absolute Monocytes: 343 {cells}/uL (ref 200–950)
Basophils Absolute: 58 {cells}/uL (ref 0–200)
Basophils Relative: 0.8 %
Eosinophils Absolute: 131 {cells}/uL (ref 15–500)
Eosinophils Relative: 1.8 %
HCT: 47.2 % — ABNORMAL HIGH (ref 35.0–45.0)
Hemoglobin: 15.4 g/dL (ref 11.7–15.5)
Lymphs Abs: 3154 {cells}/uL (ref 850–3900)
MCH: 30.6 pg (ref 27.0–33.0)
MCHC: 32.6 g/dL (ref 32.0–36.0)
MCV: 93.7 fL (ref 80.0–100.0)
MPV: 11.1 fL (ref 7.5–12.5)
Monocytes Relative: 4.7 %
Neutro Abs: 3614 {cells}/uL (ref 1500–7800)
Neutrophils Relative %: 49.5 %
Platelets: 179 10*3/uL (ref 140–400)
RBC: 5.04 10*6/uL (ref 3.80–5.10)
RDW: 14.7 % (ref 11.0–15.0)
Total Lymphocyte: 43.2 %
WBC: 7.3 10*3/uL (ref 3.8–10.8)

## 2023-03-19 LAB — COMPLETE METABOLIC PANEL WITH GFR
AG Ratio: 1.7 (calc) (ref 1.0–2.5)
ALT: 20 U/L (ref 6–29)
AST: 17 U/L (ref 10–35)
Albumin: 4.3 g/dL (ref 3.6–5.1)
Alkaline phosphatase (APISO): 68 U/L (ref 31–125)
BUN: 9 mg/dL (ref 7–25)
CO2: 26 mmol/L (ref 20–32)
Calcium: 9.5 mg/dL (ref 8.6–10.2)
Chloride: 106 mmol/L (ref 98–110)
Creat: 0.7 mg/dL (ref 0.50–0.99)
Globulin: 2.6 g/dL (ref 1.9–3.7)
Glucose, Bld: 109 mg/dL — ABNORMAL HIGH (ref 65–99)
Potassium: 3.9 mmol/L (ref 3.5–5.3)
Sodium: 141 mmol/L (ref 135–146)
Total Bilirubin: 0.3 mg/dL (ref 0.2–1.2)
Total Protein: 6.9 g/dL (ref 6.1–8.1)
eGFR: 107 mL/min/{1.73_m2} (ref >=60)

## 2023-03-19 LAB — HEMOGLOBIN A1C
Hgb A1c MFr Bld: 6 %{Hb} — ABNORMAL HIGH (ref <5.7)
Mean Plasma Glucose: 126 mg/dL
eAG (mmol/L): 7 mmol/L

## 2023-03-19 LAB — VITAMIN D 25 HYDROXY (VIT D DEFICIENCY, FRACTURES): Vit D, 25-Hydroxy: 19 ng/mL — ABNORMAL LOW (ref 30–100)

## 2023-03-20 ENCOUNTER — Encounter: Payer: 59 | Attending: Cardiology | Admitting: *Deleted

## 2023-03-20 ENCOUNTER — Encounter: Payer: Self-pay | Admitting: *Deleted

## 2023-03-20 DIAGNOSIS — Z48812 Encounter for surgical aftercare following surgery on the circulatory system: Secondary | ICD-10-CM | POA: Diagnosis not present

## 2023-03-20 DIAGNOSIS — Z951 Presence of aortocoronary bypass graft: Secondary | ICD-10-CM | POA: Insufficient documentation

## 2023-03-20 NOTE — Progress Notes (Signed)
Cardiac Individual Treatment Plan  Patient Details  Name: Lauren [name] MRN: [id number] Date of Birth: 24-Mar-1976 Referring Provider:   Flowsheet Row Cardiac Rehab from 01/31/2023 in [facility] Cardiac and Pulmonary Rehab  Referring Provider Dr. [name]       Initial Encounter Date:  Flowsheet Row Cardiac Rehab from 01/31/2023 in [facility] Cardiac and Pulmonary Rehab  Date 01/31/23       Visit Diagnosis: S/P CABG x 3  Patient's Home Medications on Admission:  Current Outpatient Medications:    acetaminophen (TYLENOL) 500 MG tablet, Take 1 tablet (500 mg total) by mouth every 6 (six) hours as needed., Disp: 30 tablet, Rfl: 0   aspirin EC 81 MG tablet, Take 1 tablet (81 mg total) by mouth daily. Swallow whole., Disp: 90 tablet, Rfl: 3   b complex vitamins capsule, Take 1 capsule by mouth daily., Disp: , Rfl:    carvedilol (COREG) 6.25 MG tablet, Take 1 tablet (6.25 mg total) by mouth 2 (two) times daily., Disp: 180 tablet, Rfl: 0   cetirizine (ZYRTEC) 10 MG tablet, Take 10 mg by mouth daily as needed for allergies., Disp: , Rfl:    dapagliflozin propanediol (FARXIGA) 10 MG TABS tablet, Take 1 tablet (10 mg total) by mouth daily., Disp: 90 tablet, Rfl: 1   DULoxetine (CYMBALTA) 60 MG capsule, Take 1 capsule (60 mg total) by mouth 2 (two) times daily., Disp: 180 capsule, Rfl: 1   fluticasone (FLONASE) 50 MCG/ACT nasal spray, Place 1 spray into both nostrils daily as needed for allergies., Disp: , Rfl:    lisinopril (ZESTRIL) 2.5 MG tablet, Take 1 tablet (2.5 mg total) by mouth daily., Disp: 90 tablet, Rfl: 3   nicotine (NICODERM CQ) 14 mg/24hr patch, Place 1 patch (14 mg total) onto the skin daily for 28 days., Disp: 21 patch, Rfl: 0   nicotine (NICODERM CQ) 7 mg/24hr patch, Place 1 patch (7 mg total) onto the skin daily., Disp: 21 patch, Rfl: 0   nitroGLYCERIN (NITROSTAT) 0.3 MG SL tablet, Place 1 tablet (0.3 mg total) under the tongue every 5 (five) minutes as needed for chest pain., Disp: 90  tablet, Rfl: 12   rosuvastatin (CRESTOR) 40 MG tablet, Take 1 tablet (40 mg total) by mouth daily., Disp: 90 tablet, Rfl: 1   Semaglutide (RYBELSUS) 7 MG TABS, Take 1 tablet (7 mg total) by mouth daily., Disp: 90 tablet, Rfl: 1   traZODone (DESYREL) 100 MG tablet, Take 1 tablet (100 mg total) by mouth at bedtime., Disp: 90 tablet, Rfl: 1   valACYclovir (VALTREX) 1000 MG tablet, TAKE 1 TABLET (1,000 MG TOTAL) BY MOUTH DAILY. TAKE AS NEEDED FOR HERPES OUTBREAK., Disp: 30 tablet, Rfl: 11  Past Medical History: Past Medical History:  Diagnosis Date   Allergic rhinitis    Anxiety    Chronic depression    Coronary artery disease involving native coronary artery without angina pectoris 10/19/2022   Cardiac cath 10/19/2022 with distal left main and ostial LAD/LCx disease as well as 95% RCA disease. => Referred for CABG x 3 LIMA-LAD, SVG-PDA, SVG-OM (10/22/2022)     Elevated LFTs    Essential hypertension 05/19/2018   Genital herpes    GERD (gastroesophageal reflux disease)    Hyperlipidemia    S/P CABG x 3: LIMA-LAD, SVG-PDA, SVG-OM 10/22/2022   Smoker 05/26/2014   Type 2 diabetes mellitus with hyperglycemia, without long-term current use of insulin (HCC) 08/18/2021   Vitamin D deficiency     Tobacco Use: Social History   Tobacco  Use  Smoking Status Some Days   Current packs/day: 0.50   Average packs/day: 0.7 packs/day for 62.8 years (45.4 ttl pk-yrs)   Types: Cigarettes   Start date: 1990  Smokeless Tobacco Never  Tobacco Comments   Working down number of cigarettes a day  Hope to have cessation by Christmas 06/12/2023. 8-9 cigarettes a day.     Labs: Review Flowsheet  More data exists      Latest Ref Rng & Units 10/21/2022 10/22/2022 10/23/2022 12/14/2022 03/18/2023  Labs for ITP Cardiac and Pulmonary Rehab  Cholestrol <200 mg/dL - - - 962  -  LDL (calc) mg/dL (calc) - - - 46  -  HDL-C > OR = 50 mg/dL - - - 40  -  Trlycerides <150 mg/dL - - - 952  -  Hemoglobin A1c <5.7 % of total Hgb  8.5  - - 6.8  6.0   PH, Arterial 7.35 - 7.45 7.44  7.322  7.340  7.271  7.328  7.301  7.396  7.302  7.387  - -  PCO2 arterial 32 - 48 mmHg 37  34.0  36.7  35.5  43.4  44.2  38.2  48.1  36.4  - -  Bicarbonate 20.0 - 28.0 mmol/L 25.1  17.5  19.7  16.4  22.9  21.8  24.6  23.4  23.7  21.8  - -  TCO2 22 - 32 mmol/L - 18  21  17  24  25  23  25  26  25  26  26  25  23   - -  Acid-base deficit 0.0 - 2.0 mmol/L - 8.0  5.0  10.0  3.0  4.0  1.0  1.0  3.0  3.0  - -  O2 Saturation % 99.5  95  97  98  95  100  76  100  100  96  - -    Details       Multiple values from one day are sorted in reverse-chronological order          Exercise Target Goals: Exercise Program Goal: Individual exercise prescription set using results from initial 6 min walk test and THRR while considering  patient's activity barriers and safety.   Exercise Prescription Goal: Initial exercise prescription builds to 30-45 minutes a day of aerobic activity, 2-3 days per week.  Home exercise guidelines will be given to patient during program as part of exercise prescription that the participant will acknowledge.   Education: Aerobic Exercise: - Group verbal and visual presentation on the components of exercise prescription. Introduces F.I.T.T principle from ACSM for exercise prescriptions.  Reviews F.I.T.T. principles of aerobic exercise including progression. Written material given at graduation. Flowsheet Row Cardiac Rehab from 02/20/2023 in [facility] Cardiac and Pulmonary Rehab  Education need identified 01/31/23       Education: Resistance Exercise: - Group verbal and visual presentation on the components of exercise prescription. Introduces F.I.T.T principle from ACSM for exercise prescriptions  Reviews F.I.T.T. principles of resistance exercise including progression. Written material given at graduation. Flowsheet Row Cardiac Rehab from 02/20/2023 in [facility] Cardiac and Pulmonary Rehab  Date 02/06/23  Educator [facility]  Instruction  Review Code 1- [employer] Understanding        Education: Exercise & Equipment Safety: - Individual verbal instruction and demonstration of equipment use and safety with use of the equipment. Flowsheet Row Cardiac Rehab from 02/20/2023 in [facility] Cardiac and Pulmonary Rehab  Date 01/31/23  Educator NT  Instruction Review Code 1-  Verbalizes Understanding       Education: Exercise Physiology & General Exercise Guidelines: - Group verbal and written instruction with models to review the exercise physiology of the cardiovascular system and associated critical values. Provides general exercise guidelines with specific guidelines to those with heart or lung disease.    Education: Flexibility, Balance, Mind/Body Relaxation: - Group verbal and visual presentation with interactive activity on the components of exercise prescription. Introduces F.I.T.T principle from ACSM for exercise prescriptions. Reviews F.I.T.T. principles of flexibility and balance exercise training including progression. Also discusses the mind body connection.  Reviews various relaxation techniques to help reduce and manage stress (i.e. Deep breathing, progressive muscle relaxation, and visualization). Balance handout provided to take home. Written material given at graduation. Flowsheet Row Cardiac Rehab from 02/20/2023 in [facility] Cardiac and Pulmonary Rehab  Date 02/06/23  Educator [facility]  Instruction Review Code 1- Verbalizes Understanding       Activity Barriers & Risk Stratification:  Activity Barriers & Cardiac Risk Stratification - 01/31/23 1608       Activity Barriers & Cardiac Risk Stratification   Activity Barriers Joint Problems;Other (comment)    Comments R hip pain    Cardiac Risk Stratification High             6 Minute Walk:  6 Minute Walk     Row Name 01/31/23 1607         6 Minute Walk   Phase Initial     Distance 1165 feet     Walk Time 6 minutes     # of Rest Breaks 0     MPH 2.21     METS  3.74     RPE 9     Perceived Dyspnea  0     VO2 Peak 13.11     Symptoms No     Resting HR 74 bpm     Resting BP 116/68     Resting Oxygen Saturation  98 %     Exercise Oxygen Saturation  during 6 min walk 99 %     Max Ex. HR 93 bpm     Max Ex. BP 122/64     2 Minute Post BP 110/64              Oxygen Initial Assessment:   Oxygen Re-Evaluation:   Oxygen Discharge (Final Oxygen Re-Evaluation):   Initial Exercise Prescription:  Initial Exercise Prescription - 01/31/23 1600       Date of Initial Exercise RX and Referring Provider   Date 01/31/23    Referring Provider Dr. [name]      Oxygen   Maintain Oxygen Saturation 88% or higher      Treadmill   MPH 2.2    Grade 0    Minutes 15    METs 2.68      NuStep   Level 2    SPM 80    Minutes 15    METs 3.74      T5 Nustep   Level 2   T6 nustep   SPM 80    Minutes 15    METs 3.74      Prescription Details   Frequency (times per week) 3    Duration Progress to 30 minutes of continuous aerobic without signs/symptoms of physical distress      Intensity   THRR 40-80% of Max Heartrate 113-153    Ratings of Perceived Exertion 11-13    Perceived Dyspnea 0-4      Progression  Progression Continue to progress workloads to maintain intensity without signs/symptoms of physical distress.      Resistance Training   Training Prescription Yes    Weight 4 lb    Reps 10-15             Perform Capillary Blood Glucose checks as needed.  Exercise Prescription Changes:   Exercise Prescription Changes     Row Name 01/31/23 1600 02/11/23 0900 03/01/23 0700 03/04/23 1700 03/12/23 1400     Response to Exercise   Blood Pressure (Admit) 116/68 110/58 98/60 -- 122/80   Blood Pressure (Exercise) 122/64 150/70 142/82 -- 134/60   Blood Pressure (Exit) 110/64 94/60 96/60  -- 120/60   Heart Rate (Admit) 74 bpm 78 bpm 91 bpm -- 87 bpm   Heart Rate (Exercise) 93 bpm 110 bpm 125 bpm -- 120 bpm   Heart Rate (Exit) 75  bpm 67 bpm 89 bpm -- 79 bpm   Oxygen Saturation (Admit) 98 % -- -- -- --   Oxygen Saturation (Exercise) 99 % -- -- -- --   Rating of Perceived Exertion (Exercise) 9 14 14  -- 13   Perceived Dyspnea (Exercise) 0 -- -- -- --   Symptoms None none none -- none   Comments Results -- -- -- --   Duration -- Progress to 30 minutes of  aerobic without signs/symptoms of physical distress Continue with 30 min of aerobic exercise without signs/symptoms of physical distress. Continue with 30 min of aerobic exercise without signs/symptoms of physical distress. Continue with 30 min of aerobic exercise without signs/symptoms of physical distress.   Intensity -- THRR unchanged THRR unchanged THRR unchanged THRR unchanged     Progression   Progression -- Continue to progress workloads to maintain intensity without signs/symptoms of physical distress. Continue to progress workloads to maintain intensity without signs/symptoms of physical distress. Continue to progress workloads to maintain intensity without signs/symptoms of physical distress. Continue to progress workloads to maintain intensity without signs/symptoms of physical distress.   Average METs -- 2.96 3.37 3.37 3.48     Resistance Training   Training Prescription -- Yes Yes Yes Yes   Weight -- 4 5 lb 5 lb 5 lb   Reps -- 10-15 10-15 10-15 10-15     Interval Training   Interval Training -- No No No No     Treadmill   MPH -- 2.4 2.6 2.6 2.4   Grade -- 4 5 5 7    Minutes -- 15 15 15 15    METs -- 4.16 4.78 4.78 5.15     NuStep   Level -- 2  T6 4  T6 nustep 4  T6 nustep 4  T6   Minutes -- 15 15 15 15    METs -- 2 2.5 2.5 2.4     Biostep-RELP   Level -- 2 3 3 4    Minutes -- 15 15 15 15    METs -- 3 2 2 3      Home Exercise Plan   Plans to continue exercise at -- -- -- [employer] (comment)  [employer] (comment)  [employer]   Frequency -- -- -- Add 2 additional days to program exercise sessions. Add 2  additional days to program exercise sessions.   Initial Home Exercises Provided -- -- -- 03/04/23 03/04/23     Oxygen   Maintain Oxygen Saturation -- 88% or higher 88% or higher 88% or higher 88% or higher  Exercise Comments:   Exercise Comments     Row Name 02/04/23 1725           Exercise Comments First full day of exercise!  Patient was oriented to gym and equipment including functions, settings, policies, and procedures.  Patient's individual exercise prescription and treatment plan were reviewed.  All starting workloads were established based on the results of the 6 minute walk test done at initial orientation visit.  The plan for exercise progression was also introduced and progression will be customized based on patient's performance and goals.  .                Exercise Goals and Review:   Exercise Goals     Row Name 01/31/23 1608             Exercise Goals   Increase Physical Activity Yes       Intervention Develop an individualized exercise prescription for aerobic and resistive training based on initial evaluation findings, risk stratification, comorbidities and participant's personal goals.;Provide advice, education, support and counseling about physical activity/exercise needs.       Expected Outcomes Short Term: Attend rehab on a regular basis to increase amount of physical activity.;Long Term: Add in home exercise to make exercise part of routine and to increase amount of physical activity.;Long Term: Exercising regularly at least 3-5 days a week.       Increase Strength and Stamina Yes       Intervention Provide advice, education, support and counseling about physical activity/exercise needs.;Develop an individualized exercise prescription for aerobic and resistive training based on initial evaluation findings, risk stratification, comorbidities and participant's personal goals.       Expected Outcomes Short Term: Perform resistance training  exercises routinely during rehab and add in resistance training at home;Short Term: Increase workloads from initial exercise prescription for resistance, speed, and METs.;Long Term: Improve cardiorespiratory fitness, muscular endurance and strength as measured by increased METs and functional capacity ( )       Able to understand and use rate of perceived exertion (RPE) scale Yes       Intervention Provide education and explanation on how to use RPE scale       Expected Outcomes Short Term: Able to use RPE daily in rehab to express subjective intensity level;Long Term:  Able to use RPE to guide intensity level when exercising independently       Able to understand and use Dyspnea scale Yes       Intervention Provide education and explanation on how to use Dyspnea scale       Expected Outcomes Short Term: Able to use Dyspnea scale daily in rehab to express subjective sense of shortness of breath during exertion       Knowledge and understanding of Target Heart Rate Range (THRR) Yes       Intervention Provide education and explanation of THRR including how the numbers were predicted and where they are located for reference       Expected Outcomes Short Term: Able to state/look up THRR;Long Term: Able to use THRR to govern intensity when exercising independently;Short Term: Able to use daily as guideline for intensity in rehab       Able to check pulse independently Yes       Intervention Provide education and demonstration on how to check pulse in carotid and radial arteries.;Review the importance of being able to check your own pulse for safety during independent exercise  Expected Outcomes Short Term: Able to explain why pulse checking is important during independent exercise;Long Term: Able to check pulse independently and accurately       Understanding of Exercise Prescription Yes       Intervention Provide education, explanation, and written materials on patient's individual exercise  prescription       Expected Outcomes Short Term: Able to explain program exercise prescription;Long Term: Able to explain home exercise prescription to exercise independently                Exercise Goals Re-Evaluation :  Exercise Goals Re-Evaluation     Row Name 02/04/23 1726 02/11/23 0946 03/01/23 0727 03/04/23 1751 03/12/23 1446     Exercise Goal Re-Evaluation   Exercise Goals Review Increase Physical Activity;Able to understand and use rate of perceived exertion (RPE) scale;Knowledge and understanding of Target Heart Rate Range (THRR);Understanding of Exercise Prescription;Increase Strength and Stamina;Able to check pulse independently Increase Physical Activity;Increase Strength and Stamina;Understanding of Exercise Prescription Increase Physical Activity;Increase Strength and Stamina;Understanding of Exercise Prescription Increase Physical Activity;Able to understand and use rate of perceived exertion (RPE) scale;Able to understand and use Dyspnea scale;Knowledge and understanding of Target Heart Rate Range (THRR);Able to check pulse independently;Increase Strength and Stamina;Understanding of Exercise Prescription Increase Physical Activity;Increase Strength and Stamina;Understanding of Exercise Prescription   Comments Reviewed RPE  and dyspnea scale, THR and program prescription with pt today.  Pt voiced understanding and was given a copy of goals to take home. Lataria is off to a good start in the program. She recently attended her first session and continues to become familiar with the program and her exercise prescription. She has increased her treadmill grade from 0% to 4% and has added the Biostep to her current prescription. We will continue to monitor her progress in the program. [place] is doing well in the program. She recently increased her treadmill workload to a speed of 2.6 mph with a 5% incline. She also improved to level 4 on the T6 nustep and level 3 on the biostep. She  increased from 4 lb to 5 lb hand weights for resistance training as well. We will continue to monitor her progress in the program. Reviewed home exercise with pt today.  Pt plans to walk and go to [employer] for exercise.  Reviewed THR, pulse, RPE, sign and symptoms, pulse oximetery and when to call 911 or MD.  Also discussed weather considerations and indoor options.  Pt voiced understanding. Kymia continues to do well in rehab. She increased her treadmill workload to a speed of 2.4 mph with an incline of 7%. She also continues to work at level 4 on the T6 nustep and improved to level 6 on the biostep. We will continue to monitor her progress in the program.   Expected Outcomes Short: Use RPE daily to regulate intensity.  Long: Follow program prescription in THR. Short: Continue to follow current exercise prescription, and progressively increase workloads. Long: Continue exercise to improve strength and stamina. Short: Continue to progressively increase workloads. Long: Continue exercise to improve strength and stamina. Short: add 1-2 days per week of exercise at home on off days of rehab. Long: become independent with exercise routine. Short: Try level 5 on the T6 nustep. Long: Continue to increase overall METs and stamina.            Discharge Exercise Prescription (Final Exercise Prescription Changes):  Exercise Prescription Changes - 03/12/23 1400       Response to Exercise  Blood Pressure (Admit) 122/80    Blood Pressure (Exercise) 134/60    Blood Pressure (Exit) 120/60    Heart Rate (Admit) 87 bpm    Heart Rate (Exercise) 120 bpm    Heart Rate (Exit) 79 bpm    Rating of Perceived Exertion (Exercise) 13    Symptoms none    Duration Continue with 30 min of aerobic exercise without signs/symptoms of physical distress.    Intensity THRR unchanged      Progression   Progression Continue to progress workloads to maintain intensity without signs/symptoms of physical distress.     Average METs 3.48      Resistance Training   Training Prescription Yes    Weight 5 lb    Reps 10-15      Interval Training   Interval Training No      Treadmill   MPH 2.4    Grade 7    Minutes 15    METs 5.15      NuStep   Level 4   T6   Minutes 15    METs 2.4      Biostep-RELP   Level 4    Minutes 15    METs 3      Home Exercise Plan   Plans to continue exercise at [employer] (comment)   [employer]   Frequency Add 2 additional days to program exercise sessions.    Initial Home Exercises Provided 03/04/23      Oxygen   Maintain Oxygen Saturation 88% or higher             Nutrition:  Target Goals: Understanding of nutrition guidelines, daily intake of sodium 1500mg , cholesterol 200mg , calories 30% from fat and 7% or less from saturated fats, daily to have 5 or more servings of fruits and vegetables.  Education: All About Nutrition: -Group instruction provided by verbal, written material, interactive activities, discussions, models, and posters to present general guidelines for heart healthy nutrition including fat, fiber, MyPlate, the role of sodium in heart healthy nutrition, utilization of the nutrition label, and utilization of this knowledge for meal planning. Follow up email sent as well. Written material given at graduation. Flowsheet Row Cardiac Rehab from 02/20/2023 in [facility] Cardiac and Pulmonary Rehab  Education need identified 01/31/23  Date 02/20/23  Educator JG  Instruction Review Code 1- Verbalizes Understanding       Biometrics:  Pre Biometrics - 01/31/23 1609       Pre Biometrics   Height 5' 4.5" (1.638 m)    Weight 162 lb 4.8 oz (73.6 kg)    Waist Circumference 37 inches    Hip Circumference 38 inches    Waist to Hip Ratio 0.97 %    BMI (Calculated) 27.44    Single Leg Stand 25 seconds              Nutrition Therapy Plan and Nutrition Goals:   Nutrition Assessments:  MEDIFICTS Score Key: >=70 Need to make  dietary changes  40-70 Heart Healthy Diet <= 40 Therapeutic Level Cholesterol Diet  Flowsheet Row Cardiac Rehab from 01/31/2023 in [facility] Cardiac and Pulmonary Rehab  Picture Your Plate Total Score on Admission 38      Picture Your Plate Scores: <16 Unhealthy dietary pattern with much room for improvement. 41-50 Dietary pattern unlikely to meet recommendations for good health and room for improvement. 51-60 More healthful dietary pattern, with some room for improvement.  >60 Healthy dietary pattern, although there may be some specific behaviors  that could be improved.    Nutrition Goals Re-Evaluation:  Nutrition Goals Re-Evaluation     Row Name 03/04/23 1755             Goals   Nutrition Goal reschedule missed appointment with RD       Comment reschedule missed appointment with RD       Expected Outcome Short: meet with RD. Long: establish and follow goals set by RD.                Nutrition Goals Discharge (Final Nutrition Goals Re-Evaluation):  Nutrition Goals Re-Evaluation - 03/04/23 1755       Goals   Nutrition Goal reschedule missed appointment with RD    Comment reschedule missed appointment with RD    Expected Outcome Short: meet with RD. Long: establish and follow goals set by RD.             Psychosocial: Target Goals: Acknowledge presence or absence of significant depression and/or stress, maximize coping skills, provide positive support system. Participant is able to verbalize types and ability to use techniques and skills needed for reducing stress and depression.   Education: Stress, Anxiety, and Depression - Group verbal and visual presentation to define topics covered.  Reviews how body is impacted by stress, anxiety, and depression.  Also discusses healthy ways to reduce stress and to treat/manage anxiety and depression.  Written material given at graduation.   Education: Sleep Hygiene -Provides group verbal and written instruction about how  sleep can affect your health.  Define sleep hygiene, discuss sleep cycles and impact of sleep habits. Review good sleep hygiene tips.    Initial Review & Psychosocial Screening:  Initial Psych Review & Screening - 01/07/23 1027       Initial Review   Current issues with None Identified      Family Dynamics   Good Support System? Yes   daughter, son, mama     Barriers   Psychosocial barriers to participate in program There are no identifiable barriers or psychosocial needs.      Screening Interventions   Interventions Encouraged to exercise;To provide support and resources with identified psychosocial needs;Provide feedback about the scores to participant    Expected Outcomes Short Term goal: Utilizing psychosocial counselor, staff and physician to assist with identification of specific Stressors or current issues interfering with healing process. Setting desired goal for each stressor or current issue identified.;Long Term Goal: Stressors or current issues are controlled or eliminated.;Short Term goal: Identification and review with participant of any Quality of Life or Depression concerns found by scoring the questionnaire.;Long Term goal: The participant improves quality of Life and PHQ9 Scores as seen by post scores and/or verbalization of changes             Quality of Life Scores:   Quality of Life - 01/31/23 1556       Quality of Life   Select Quality of Life      Quality of Life Scores   Health/Function Pre 23.2 %    Socioeconomic Pre 23.63 %    Psych/Spiritual Pre 24.79 %    Family Pre 24 %    GLOBAL Pre 23.73 %            Scores of 19 and below usually indicate a poorer quality of life in these areas.  A difference of  2-3 points is a clinically meaningful difference.  A difference of 2-3 points in the total score of the Quality  of Life Index has been associated with significant improvement in overall quality of life, self-image, physical symptoms, and general  health in studies assessing change in quality of life.  PHQ-9: Review Flowsheet  More data exists      03/18/2023 01/31/2023 01/11/2023 12/14/2022 11/06/2022  Depression screen PHQ 2/9  Decreased Interest 0 1 1 1 1   Down, Depressed, Hopeless 0 0 0 1 1  PHQ - 2 Score 0 1 1 2 2   Altered sleeping 0 1 1 0 0  Tired, decreased energy 0 1 1 0 0  Change in appetite 0 1 0 0 0  Feeling bad or failure about yourself  0 0 1 1 0  Trouble concentrating 0 0 0 0 0  Moving slowly or fidgety/restless 0 0 0 0 0  Suicidal thoughts 0 0 0 0 0  PHQ-9 Score 0 4 4 3 2   Difficult doing work/chores Not difficult at all Not difficult at all Not difficult at all Somewhat difficult Somewhat difficult    Details           Interpretation of Total Score  Total Score Depression Severity:  1-4 = Minimal depression, 5-9 = Mild depression, 10-14 = Moderate depression, 15-19 = Moderately severe depression, 20-27 = Severe depression   Psychosocial Evaluation and Intervention:  Psychosocial Evaluation - 01/07/23 1033       Psychosocial Evaluation & Interventions   Interventions Encouraged to exercise with the program and follow exercise prescription    Comments Aalaya has no barriers to attending the program.  She has a good support system with her daughter, son and her Mother.  She hopes to learn her heart rate limits and how to manage her heart disease so she can return to hiking and visiting waterfalls.    Expected Outcomes STG Tillman Sers is able to attend all scheduled sessions and is able to see progression with her exercise. Her goal is to have stamina to get back to hiking and visiting waterfalls. LTG Cale has returned to her hikes and visiting warefalls without any cardiac concerns.    Continue Psychosocial Services  Follow up required by staff             Psychosocial Re-Evaluation:  Psychosocial Re-Evaluation     Row Name 03/04/23 1757             Psychosocial Re-Evaluation   Current issues with  None Identified       Comments Bonney has no new sleep, stress, or mental health concerns.       Expected Outcomes Short: continue to attend cardiac rehab consistently for mental health benefits. Long: maintain good mental health routine.       Interventions Encouraged to attend Cardiac Rehabilitation for the exercise       Continue Psychosocial Services  Follow up required by staff                Psychosocial Discharge (Final Psychosocial Re-Evaluation):  Psychosocial Re-Evaluation - 03/04/23 1757       Psychosocial Re-Evaluation   Current issues with None Identified    Comments Derya has no new sleep, stress, or mental health concerns.    Expected Outcomes Short: continue to attend cardiac rehab consistently for mental health benefits. Long: maintain good mental health routine.    Interventions Encouraged to attend Cardiac Rehabilitation for the exercise    Continue Psychosocial Services  Follow up required by staff             Vocational Rehabilitation:  Provide vocational rehab assistance to qualifying candidates.   Vocational Rehab Evaluation & Intervention:  Vocational Rehab - 03/04/23 1756       Initial Vocational Rehab Evaluation & Intervention   Assessment shows need for Vocational Rehabilitation No      Vocational Rehab Re-Evaulation   Comments currently back at work             Education: Education Goals: Education classes will be provided on a variety of topics geared toward better understanding of heart health and risk factor modification. Participant will state understanding/return demonstration of topics presented as noted by education test scores.  Learning Barriers/Preferences:  Learning Barriers/Preferences - 01/07/23 1029       Learning Barriers/Preferences   Learning Barriers None    Learning Preferences None             General Cardiac Education Topics:  AED/CPR: - Group verbal and written instruction with the use of models to  demonstrate the basic use of the AED with the basic ABC's of resuscitation.   Anatomy and Cardiac Procedures: - Group verbal and visual presentation and models provide information about basic cardiac anatomy and function. Reviews the testing methods done to diagnose heart disease and the outcomes of the test results. Describes the treatment choices: Medical Management, Angioplasty, or Coronary Bypass Surgery for treating various heart conditions including Myocardial Infarction, Angina, Valve Disease, and Cardiac Arrhythmias.  Written material given at graduation. Flowsheet Row Cardiac Rehab from 02/20/2023 in [facility] Cardiac and Pulmonary Rehab  Education need identified 01/31/23       Medication Safety: - Group verbal and visual instruction to review commonly prescribed medications for heart and lung disease. Reviews the medication, class of the drug, and side effects. Includes the steps to properly store meds and maintain the prescription regimen.  Written material given at graduation.   Intimacy: - Group verbal instruction through game format to discuss how heart and lung disease can affect sexual intimacy. Written material given at graduation..   Know Your Numbers and Heart Failure: - Group verbal and visual instruction to discuss disease risk factors for cardiac and pulmonary disease and treatment options.  Reviews associated critical values for Overweight/Obesity, Hypertension, Cholesterol, and Diabetes.  Discusses basics of heart failure: signs/symptoms and treatments.  Introduces Heart Failure Zone chart for action plan for heart failure.  Written material given at graduation.   Infection Prevention: - Provides verbal and written material to individual with discussion of infection control including proper hand washing and proper equipment cleaning during exercise session. Flowsheet Row Cardiac Rehab from 02/20/2023 in [facility] Cardiac and Pulmonary Rehab  Date 01/31/23  Educator NT   Instruction Review Code 1- Verbalizes Understanding       Falls Prevention: - Provides verbal and written material to individual with discussion of falls prevention and safety. Flowsheet Row Cardiac Rehab from 02/20/2023 in [facility] Cardiac and Pulmonary Rehab  Date 01/07/23  Educator SB  Instruction Review Code 1- Verbalizes Understanding       Other: -Provides group and verbal instruction on various topics (see comments)   Knowledge Questionnaire Score:  Knowledge Questionnaire Score - 01/31/23 1556       Knowledge Questionnaire Score   Pre Score 20/26             Core Components/Risk Factors/Patient Goals at Admission:  Personal Goals and Risk Factors at Admission - 01/07/23 1131       Core Components/Risk Factors/Patient Goals on Admission   Number of packs per day  Smoking cessation instruction/counseling given:  counseled patient on the dangers of tobacco use, advised patient to stop smoking, and reviewed strategies to maximize success. Wally is a current tobacco user. Intervention for tobacco cessation was provided at the initial medical review. She was asked about readiness to quit and reported that she is weaning slowly to a Quit date of 06/12/2023 . Patient was advised and educated about tobacco cessation using combination therapy, tobacco cessation classes, quit line, and quit smoking apps. Patient demonstrated understanding of this material. Staff will continue to provide encouragement and follow up with the patient throughout the program.             Education:Diabetes - Individual verbal and written instruction to review signs/symptoms of diabetes, desired ranges of glucose level fasting, after meals and with exercise. Acknowledge that pre and post exercise glucose checks will be done for 3 sessions at entry of program. Flowsheet Row Cardiac Rehab from 02/20/2023 in [facility] Cardiac and Pulmonary Rehab  Date 01/31/23  Educator NT  Instruction Review Code 1-  Verbalizes Understanding       Core Components/Risk Factors/Patient Goals Review:   Goals and Risk Factor Review     Row Name 03/04/23 1758             Core Components/Risk Factors/Patient Goals Review   Personal Goals Review Diabetes;Tobacco Cessation       Review Patient reported that she continues to use nicotine patch and it is helpful. She has cut back to about 6 cigarettes a day. She also monitors her blood sugars at home and states they are under control. Her las A1C is reported to be around 6.9.       Expected Outcomes Short: continue to monitor blood sugars at home and maintain A1C below 7%. Continue to cut back on amount of cigarettes.. Long: quit smoking.                Core Components/Risk Factors/Patient Goals at Discharge (Final Review):   Goals and Risk Factor Review - 03/04/23 1758       Core Components/Risk Factors/Patient Goals Review   Personal Goals Review Diabetes;Tobacco Cessation    Review Patient reported that she continues to use nicotine patch and it is helpful. She has cut back to about 6 cigarettes a day. She also monitors her blood sugars at home and states they are under control. Her las A1C is reported to be around 6.9.    Expected Outcomes Short: continue to monitor blood sugars at home and maintain A1C below 7%. Continue to cut back on amount of cigarettes.. Long: quit smoking.             ITP Comments:  ITP Comments     Row Name 01/07/23 1131 01/31/23 1553 02/04/23 1725 02/20/23 1056 03/20/23 1222   ITP Comments irtual orientation call completed today. shehas an appointment on Date: 01/31/2023  for EP eval and gym Orientation.  Documentation of diagnosis can be found in [facility] 10/19/[facility]22 .   Smoking cessation instruction/counseling given:  counseled patient on the dangers of tobacco use, advised patient to stop smoking, and reviewed strategies to maximize success. Shayna is a current tobacco user. Intervention for tobacco cessation was  provided at the initial medical review. She was asked about readiness to quit and reported that she is weaning slowly to a Quit date of 06/12/2023 . Patient was advised and educated about tobacco cessation using combination therapy, tobacco cessation classes, quit line, and quit smoking apps. Patient demonstrated  understanding of this material. Staff will continue to provide encouragement and follow up with the patient throughout the program. Completed and gym orientation. Initial ITP created and sent for review to Dr. Bethann Punches, Medical Director. First full day of exercise!  Patient was oriented to gym and equipment including functions, settings, policies, and procedures.  Patient's individual exercise prescription and treatment plan were reviewed.  All starting workloads were established based on the results of the 6 minute walk test done at initial orientation visit.  The plan for exercise progression was also introduced and progression will be customized based on patient's performance and goals.  . 30 Day review completed. Medical Director ITP review done, changes made as directed, and signed approval by Medical Director.     new to program 30 Day review completed. Medical Director ITP review done, changes made as directed, and signed approval by Medical Director.            Comments:

## 2023-03-20 NOTE — Progress Notes (Signed)
Daily Session Note  Patient Details  Name: Lauren [name] MRN: [id number] Date of Birth: [birth year] Referring Provider:   Flowsheet Row Cardiac Rehab from 01/31/2023 in [facility] Cardiac and Pulmonary Rehab  Referring Provider Dr. [name]       Encounter Date: 03/20/2023  Check In:  Session Check In - 03/20/23 1726       Check-In   Supervising physician immediately available to respond to emergencies See telemetry face sheet for immediately available ER MD    Location [facility]-Cardiac & Pulmonary Rehab    Staff Present Maxon Conetta BS, , Exercise Physiologist;Joseph Rod Can, RN BSN    Virtual Visit No    Medication changes reported     No    Fall or balance concerns reported    No    Tobacco Cessation No Change    Current number of cigarettes/nicotine per day     7    Warm-up and Cool-down Performed on first and last piece of equipment    Resistance Training Performed Yes    VAD Patient? No    PAD/SET Patient? No      Pain Assessment   Currently in Pain? No/denies                Social History   Tobacco Use  Smoking Status Some Days   Current packs/day: 0.50   Average packs/day: 0.7 packs/day for 62.8 years (45.4 ttl pk-yrs)   Types: Cigarettes   Start date: 1990  Smokeless Tobacco Never  Tobacco Comments   Working down number of cigarettes a day  Hope to have cessation by Christmas 06/12/2023. 8-9 cigarettes a day.     Goals Met:  Independence with exercise equipment Exercise tolerated well No report of concerns or symptoms today Strength training completed today  Goals Unmet:  Not Applicable  Comments: Pt able to follow exercise prescription today without complaint.  Will continue to monitor for progression.    Dr. Bethann Punches is Medical Director for [facility] Cardiac Rehabilitation.  Dr. Vida Rigger is Medical Director for [facility] Pulmonary Rehabilitation.

## 2023-03-20 NOTE — Progress Notes (Signed)
Your labs are back Your A1c was 6.0 which is in goal- great job, please continue your current regimen Your electrolytes, liver and kidney function were overall normal at this time Your CBC was normal- no signs of anemia Your Vitamin D was low- I recommend supplementing with 600-800 units per day. We can recheck this in about 3 months to make sure it is improving.  Please let us know if you have questions or concerns

## 2023-03-21 ENCOUNTER — Encounter: Payer: 59 | Admitting: *Deleted

## 2023-03-21 ENCOUNTER — Other Ambulatory Visit: Payer: Self-pay | Admitting: Family Medicine

## 2023-03-21 DIAGNOSIS — E1169 Type 2 diabetes mellitus with other specified complication: Secondary | ICD-10-CM

## 2023-03-21 DIAGNOSIS — Z48812 Encounter for surgical aftercare following surgery on the circulatory system: Secondary | ICD-10-CM | POA: Diagnosis not present

## 2023-03-21 DIAGNOSIS — Z951 Presence of aortocoronary bypass graft: Secondary | ICD-10-CM

## 2023-03-21 NOTE — Progress Notes (Signed)
Daily Session Note  Patient Details  Name: Lauren [name] MRN: [id number] Date of Birth: 01-24-1976 Referring Provider:   Flowsheet Row Cardiac Rehab from 01/31/2023 in [facility] Cardiac and Pulmonary Rehab  Referring Provider Dr. [name]       Encounter Date: 03/21/2023  Check In:  Session Check In - 03/21/23 1725       Check-In   Supervising physician immediately available to respond to emergencies See telemetry face sheet for immediately available ER MD    Location [facility]-Cardiac & Pulmonary Rehab    Staff Present Maxon Suzzette Righter, , Exercise Physiologist;Oreta Soloway Jewel Baize, RN BSN;Joseph Reino Kent, RCP,RRT,BSRT    Virtual Visit No    Medication changes reported     No    Fall or balance concerns reported    No    Tobacco Cessation No Change    Current number of cigarettes/nicotine per day     7    Warm-up and Cool-down Performed on first and last piece of equipment    Resistance Training Performed Yes    VAD Patient? No    PAD/SET Patient? No      Pain Assessment   Currently in Pain? No/denies                Social History   Tobacco Use  Smoking Status Some Days   Current packs/day: 0.50   Average packs/day: 0.7 packs/day for 62.8 years (45.4 ttl pk-yrs)   Types: Cigarettes   Start date: 1990  Smokeless Tobacco Never  Tobacco Comments   Working down number of cigarettes a day  Hope to have cessation by Christmas 06/12/2023. 8-9 cigarettes a day.     Goals Met:  Independence with exercise equipment Exercise tolerated well No report of concerns or symptoms today Strength training completed today  Goals Unmet:  Not Applicable  Comments: Pt able to follow exercise prescription today without complaint.  Will continue to monitor for progression.    Dr. Bethann Punches is Medical Director for [facility] Cardiac Rehabilitation.  Dr. Vida Rigger is Medical Director for [facility] Pulmonary Rehabilitation.

## 2023-03-25 ENCOUNTER — Encounter: Payer: 59 | Admitting: *Deleted

## 2023-03-25 DIAGNOSIS — Z951 Presence of aortocoronary bypass graft: Secondary | ICD-10-CM

## 2023-03-25 DIAGNOSIS — Z48812 Encounter for surgical aftercare following surgery on the circulatory system: Secondary | ICD-10-CM | POA: Diagnosis not present

## 2023-03-25 NOTE — Progress Notes (Signed)
Daily Session Note  Patient Details  Name: Lauren [name] MRN: [id number] Date of Birth: [birth year] Referring Provider:   Flowsheet Row Cardiac Rehab from 01/31/2023 in [facility] Cardiac and Pulmonary Rehab  Referring Provider Dr. [name]       Encounter Date: 03/25/2023  Check In:  Session Check In - 03/25/23 1709       Check-In   Supervising physician immediately available to respond to emergencies See telemetry face sheet for immediately available ER MD    Location [facility]-Cardiac & Pulmonary Rehab    Staff Present Tommye Standard, BS, ACSM CEP, Exercise Physiologist;Shaterria Sager Jewel Baize, RN BSN;Joseph Reino Kent, Guinevere Ferrari, RN, [place]    Virtual Visit No    Medication changes reported     No    Fall or balance concerns reported    No    Tobacco Cessation No Change    Current number of cigarettes/nicotine per day     7    Warm-up and Cool-down Performed on first and last piece of equipment    Resistance Training Performed Yes    VAD Patient? No    PAD/SET Patient? No      Pain Assessment   Currently in Pain? No/denies                Social History   Tobacco Use  Smoking Status Some Days   Current packs/day: 0.50   Average packs/day: 0.7 packs/day for 62.8 years (45.4 ttl pk-yrs)   Types: Cigarettes   Start date: 1990  Smokeless Tobacco Never  Tobacco Comments   Working down number of cigarettes a day  Hope to have cessation by Christmas 06/12/2023. 8-9 cigarettes a day.     Goals Met:  Independence with exercise equipment Exercise tolerated well No report of concerns or symptoms today Strength training completed today  Goals Unmet:  Not Applicable  Comments: Pt able to follow exercise prescription today without complaint.  Will continue to monitor for progression.    Dr. Bethann Punches is Medical Director for [facility] Cardiac Rehabilitation.  Dr. Vida Rigger is Medical Director for [facility] Pulmonary Rehabilitation.

## 2023-03-27 ENCOUNTER — Encounter: Payer: 59 | Admitting: *Deleted

## 2023-03-27 DIAGNOSIS — Z951 Presence of aortocoronary bypass graft: Secondary | ICD-10-CM

## 2023-03-27 DIAGNOSIS — Z48812 Encounter for surgical aftercare following surgery on the circulatory system: Secondary | ICD-10-CM | POA: Diagnosis not present

## 2023-03-27 NOTE — Progress Notes (Signed)
Daily Session Note  Patient Details  Name: Lauren [name] MRN: [id number] Date of Birth: February 26, 1976 Referring Provider:   Flowsheet Row Cardiac Rehab from 01/31/2023 in [facility] Cardiac and Pulmonary Rehab  Referring Provider Dr. [name]       Encounter Date: 03/27/2023  Check In:  Session Check In - 03/27/23 1723       Check-In   Supervising physician immediately available to respond to emergencies See telemetry face sheet for immediately available ER MD    Location [facility]-Cardiac & Pulmonary Rehab    Staff Present Tommye Standard, BS, ACSM CEP, Exercise Physiologist;Anntonette Madewell Jewel Baize, RN BSN;Joseph Reino Kent, RCP,RRT,BSRT    Virtual Visit No    Medication changes reported     No    Fall or balance concerns reported    No    Tobacco Cessation No Change    Current number of cigarettes/nicotine per day     7    Warm-up and Cool-down Performed on first and last piece of equipment    Resistance Training Performed Yes    VAD Patient? No    PAD/SET Patient? No      Pain Assessment   Currently in Pain? No/denies                Social History   Tobacco Use  Smoking Status Some Days   Current packs/day: 0.50   Average packs/day: 0.7 packs/day for 62.8 years (45.4 ttl pk-yrs)   Types: Cigarettes   Start date: 1990  Smokeless Tobacco Never  Tobacco Comments   Working down number of cigarettes a day  Hope to have cessation by Christmas 06/12/2023. 8-9 cigarettes a day.     Goals Met:  Independence with exercise equipment Exercise tolerated well No report of concerns or symptoms today Strength training completed today  Goals Unmet:  Not Applicable  Comments: Pt able to follow exercise prescription today without complaint.  Will continue to monitor for progression.    Dr. Bethann Punches is Medical Director for [facility] Cardiac Rehabilitation.  Dr. Vida Rigger is Medical Director for [facility] Pulmonary Rehabilitation.

## 2023-03-28 ENCOUNTER — Encounter: Payer: 59 | Admitting: *Deleted

## 2023-03-28 DIAGNOSIS — Z951 Presence of aortocoronary bypass graft: Secondary | ICD-10-CM

## 2023-03-28 DIAGNOSIS — Z48812 Encounter for surgical aftercare following surgery on the circulatory system: Secondary | ICD-10-CM | POA: Diagnosis not present

## 2023-03-28 NOTE — Progress Notes (Signed)
Daily Session Note  Patient Details  Name: Lauren [name] MRN: [id number] Date of Birth: December 02, 1975 Referring Provider:   Flowsheet Row Cardiac Rehab from 01/31/2023 in [facility] Cardiac and Pulmonary Rehab  Referring Provider Dr. [name]       Encounter Date: 03/28/2023  Check In:  Session Check In - 03/28/23 1712       Check-In   Supervising physician immediately available to respond to emergencies See telemetry face sheet for immediately available ER MD    Location [facility]-Cardiac & Pulmonary Rehab    Staff Present Maxon Suzzette Righter, , Exercise Physiologist;Mykaylah Ballman Jewel Baize, RN BSN;Joseph Reino Kent, RCP,RRT,BSRT    Virtual Visit No    Medication changes reported     No    Fall or balance concerns reported    No    Tobacco Cessation No Change    Current number of cigarettes/nicotine per day     7    Warm-up and Cool-down Performed on first and last piece of equipment    Resistance Training Performed Yes    VAD Patient? No    PAD/SET Patient? No      Pain Assessment   Currently in Pain? No/denies                Social History   Tobacco Use  Smoking Status Some Days   Current packs/day: 0.50   Average packs/day: 0.7 packs/day for 62.8 years (45.4 ttl pk-yrs)   Types: Cigarettes   Start date: 1990  Smokeless Tobacco Never  Tobacco Comments   Working down number of cigarettes a day  Hope to have cessation by Christmas 06/12/2023. 8-9 cigarettes a day.     Goals Met:  Independence with exercise equipment Exercise tolerated well No report of concerns or symptoms today Strength training completed today  Goals Unmet:  Not Applicable  Comments: Pt able to follow exercise prescription today without complaint.  Will continue to monitor for progression.    Dr. Bethann Punches is Medical Director for [facility] Cardiac Rehabilitation.  Dr. Vida Rigger is Medical Director for [facility] Pulmonary Rehabilitation.

## 2023-03-29 ENCOUNTER — Encounter: Payer: Self-pay | Admitting: Family Medicine

## 2023-03-29 ENCOUNTER — Other Ambulatory Visit: Payer: Self-pay | Admitting: Family Medicine

## 2023-03-29 DIAGNOSIS — E1169 Type 2 diabetes mellitus with other specified complication: Secondary | ICD-10-CM

## 2023-03-29 NOTE — Telephone Encounter (Signed)
Rybelsus dose was changed and sent in on 01/11/23.   Requested Prescriptions  Pending Prescriptions Disp Refills   RYBELSUS 14 MG TABS [Pharmacy Med Name: RYBELSUS 14 MG TABLET] 90 tablet 0    Sig: TAKE 1 TABLET (14 MG TOTAL) BY MOUTH DAILY     Off-Protocol Failed - 03/29/2023  4:56 PM      Failed - Medication not assigned to a protocol, review manually.      Passed - Valid encounter within last 12 months    Recent Outpatient Visits           1 week ago Chronic obstructive pulmonary disease, unspecified COPD type (HCC)   [place] [facility] Mecum, Erin E, PA-C   2 months ago Major depression, recurrent, chronic [facility])   [facility] Danelle Berry, PA-C   3 months ago Dyslipidemia associated with type 2 diabetes mellitus [facility])   [place] [facility] Alba Cory, MD   4 months ago Type 2 diabetes mellitus with hyperglycemia, without long-term current use of insulin [facility])   [facility] Danelle Berry, PA-C   6 months ago Type 2 diabetes mellitus with hyperglycemia, without long-term current use of insulin [facility])   [place] [facility] Margarita Mail, DO       Future Appointments             In 2 months Danelle Berry, PA-C [facility], [facility]   In 5 months Margarita Mail, DO [facility], [facility]

## 2023-04-01 ENCOUNTER — Encounter: Payer: 59 | Admitting: *Deleted

## 2023-04-01 DIAGNOSIS — Z951 Presence of aortocoronary bypass graft: Secondary | ICD-10-CM

## 2023-04-01 DIAGNOSIS — Z48812 Encounter for surgical aftercare following surgery on the circulatory system: Secondary | ICD-10-CM | POA: Diagnosis not present

## 2023-04-01 NOTE — Progress Notes (Signed)
Daily Session Note  Patient Details  Name: Lauren [name] MRN: [id number] Date of Birth: 18-Jun-1976 Referring Provider:   Flowsheet Row Cardiac Rehab from 01/31/2023 in [facility] Cardiac and Pulmonary Rehab  Referring Provider Dr. [name]       Encounter Date: 04/01/2023  Check In:  Session Check In - 04/01/23 1719       Check-In   Supervising physician immediately available to respond to emergencies See telemetry face sheet for immediately available ER MD    Location [facility]-Cardiac & Pulmonary Rehab    Staff Present Tommye Standard, BS, ACSM CEP, Exercise Physiologist;Megan Katrinka Blazing, RN, ADN;Weyman Bogdon Jewel Baize, RN BSN;Joseph Reino Kent, RCP,RRT,BSRT    Virtual Visit No    Medication changes reported     No    Fall or balance concerns reported    No    Tobacco Cessation No Change    Current number of cigarettes/nicotine per day     6    Warm-up and Cool-down Performed on first and last piece of equipment    Resistance Training Performed Yes    VAD Patient? No    PAD/SET Patient? No      Pain Assessment   Currently in Pain? No/denies                Social History   Tobacco Use  Smoking Status Some Days   Current packs/day: 0.50   Average packs/day: 0.7 packs/day for 62.8 years (45.4 ttl pk-yrs)   Types: Cigarettes   Start date: 1990  Smokeless Tobacco Never  Tobacco Comments   Working down number of cigarettes a day  Hope to have cessation by Christmas 06/12/2023. 8-9 cigarettes a day.     Goals Met:  Independence with exercise equipment Exercise tolerated well No report of concerns or symptoms today Strength training completed today  Goals Unmet:  Not Applicable  Comments: Pt able to follow exercise prescription today without complaint.  Will continue to monitor for progression.    Dr. Bethann Punches is Medical Director for [facility] Cardiac Rehabilitation.  Dr. Vida Rigger is Medical Director for [facility] Pulmonary Rehabilitation.

## 2023-04-04 ENCOUNTER — Encounter: Payer: 59 | Admitting: *Deleted

## 2023-04-04 DIAGNOSIS — Z48812 Encounter for surgical aftercare following surgery on the circulatory system: Secondary | ICD-10-CM | POA: Diagnosis not present

## 2023-04-04 DIAGNOSIS — Z951 Presence of aortocoronary bypass graft: Secondary | ICD-10-CM

## 2023-04-04 NOTE — Progress Notes (Signed)
Daily Session Note  Patient Details  Name: Lauren [name] MRN: [id number] Date of Birth: Oct 21, 1975 Referring Provider:   Flowsheet Row Cardiac Rehab from 01/31/2023 in [facility] Cardiac and Pulmonary Rehab  Referring Provider Dr. [name]       Encounter Date: 04/04/2023  Check In:  Session Check In - 04/04/23 1715       Check-In   Supervising physician immediately available to respond to emergencies See telemetry face sheet for immediately available ER MD    Location [facility]-Cardiac & Pulmonary Rehab    Staff Present Maxon Suzzette Righter, , Exercise Physiologist;Elyanna Wallick Jewel Baize, RN BSN;Joseph Reino Kent, RCP,RRT,BSRT    Virtual Visit No    Medication changes reported     No    Fall or balance concerns reported    No    Tobacco Cessation No Change    Current number of cigarettes/nicotine per day     6    Warm-up and Cool-down Performed on first and last piece of equipment    Resistance Training Performed Yes    VAD Patient? No    PAD/SET Patient? No      Pain Assessment   Currently in Pain? No/denies                Social History   Tobacco Use  Smoking Status Some Days   Current packs/day: 0.50   Average packs/day: 0.7 packs/day for 62.8 years (45.4 ttl pk-yrs)   Types: Cigarettes   Start date: 1990  Smokeless Tobacco Never  Tobacco Comments   Working down number of cigarettes a day  Hope to have cessation by Christmas 06/12/2023. 8-9 cigarettes a day.     Goals Met:  Independence with exercise equipment Exercise tolerated well No report of concerns or symptoms today Strength training completed today  Goals Unmet:  Not Applicable  Comments: Pt able to follow exercise prescription today without complaint.  Will continue to monitor for progression.    Dr. Bethann Punches is Medical Director for [facility] Cardiac Rehabilitation.  Dr. Vida Rigger is Medical Director for [facility] Pulmonary Rehabilitation.

## 2023-04-10 ENCOUNTER — Encounter: Payer: 59 | Admitting: *Deleted

## 2023-04-10 DIAGNOSIS — Z951 Presence of aortocoronary bypass graft: Secondary | ICD-10-CM

## 2023-04-10 DIAGNOSIS — Z48812 Encounter for surgical aftercare following surgery on the circulatory system: Secondary | ICD-10-CM | POA: Diagnosis not present

## 2023-04-10 NOTE — Progress Notes (Signed)
Daily Session Note  Patient Details  Name: Lauren [name] MRN: [id number] Date of Birth: Sep 10, 1975 Referring Provider:   Flowsheet Row Cardiac Rehab from 01/31/2023 in [facility] Cardiac and Pulmonary Rehab  Referring Provider Dr. [name]       Encounter Date: 04/10/2023  Check In:  Session Check In - 04/10/23 1714       Check-In   Supervising physician immediately available to respond to emergencies See telemetry face sheet for immediately available ER MD    Location [facility]-Cardiac & Pulmonary Rehab    Staff Present Susann Givens, RN BSN;Joseph [place], RCP,RRT,BSRT;Kelly Gilman, BS, ACSM CEP, Exercise Physiologist;Susanne Bice, RN, BSN, CCRP    Virtual Visit No    Medication changes reported     No    Fall or balance concerns reported    No    Tobacco Cessation No Change    Current number of cigarettes/nicotine per day     6    Warm-up and Cool-down Performed on first and last piece of equipment    Resistance Training Performed Yes    VAD Patient? No    PAD/SET Patient? No      Pain Assessment   Currently in Pain? No/denies                Social History   Tobacco Use  Smoking Status Some Days   Current packs/day: 0.50   Average packs/day: 0.7 packs/day for 62.8 years (45.4 ttl pk-yrs)   Types: Cigarettes   Start date: 1990  Smokeless Tobacco Never  Tobacco Comments   Working down number of cigarettes a day  Hope to have cessation by Christmas 06/12/2023. 8-9 cigarettes a day.     Goals Met:  Independence with exercise equipment Exercise tolerated well No report of concerns or symptoms today Strength training completed today  Goals Unmet:  Not Applicable  Comments: Pt able to follow exercise prescription today without complaint.  Will continue to monitor for progression.    Dr. Bethann Punches is Medical Director for [facility] Cardiac Rehabilitation.  Dr. Vida Rigger is Medical Director for [facility] Pulmonary Rehabilitation.

## 2023-04-11 ENCOUNTER — Encounter: Payer: 59 | Admitting: *Deleted

## 2023-04-11 DIAGNOSIS — Z48812 Encounter for surgical aftercare following surgery on the circulatory system: Secondary | ICD-10-CM | POA: Diagnosis not present

## 2023-04-11 DIAGNOSIS — Z951 Presence of aortocoronary bypass graft: Secondary | ICD-10-CM

## 2023-04-11 NOTE — Progress Notes (Signed)
Daily Session Note  Patient Details  Name: Lauren [name] MRN: [id number] Date of Birth: 24-May-1976 Referring Provider:   Flowsheet Row Cardiac Rehab from 01/31/2023 in [facility] Cardiac and Pulmonary Rehab  Referring Provider Dr. [name]       Encounter Date: 04/11/2023  Check In:  Session Check In - 04/11/23 1722       Check-In   Supervising physician immediately available to respond to emergencies See telemetry face sheet for immediately available ER MD    Location [facility]-Cardiac & Pulmonary Rehab    Staff Present Maxon Suzzette Righter, , Exercise Physiologist;Abbigale Mcelhaney Jewel Baize, RN BSN;Joseph Hood, RCP,RRT,BSRT;Margaret Best, MS, Exercise Physiologist    Virtual Visit No    Medication changes reported     No    Fall or balance concerns reported    No    Tobacco Cessation No Change    Current number of cigarettes/nicotine per day     6    Warm-up and Cool-down Performed on first and last piece of equipment    Resistance Training Performed Yes    VAD Patient? No    PAD/SET Patient? No      Pain Assessment   Currently in Pain? No/denies                Social History   Tobacco Use  Smoking Status Some Days   Current packs/day: 0.50   Average packs/day: 0.7 packs/day for 62.8 years (45.4 ttl pk-yrs)   Types: Cigarettes   Start date: 1990  Smokeless Tobacco Never  Tobacco Comments   Working down number of cigarettes a day  Hope to have cessation by Christmas 06/12/2023. 8-9 cigarettes a day.     Goals Met:  Independence with exercise equipment Exercise tolerated well No report of concerns or symptoms today Strength training completed today  Goals Unmet:  Not Applicable  Comments: Pt able to follow exercise prescription today without complaint.  Will continue to monitor for progression.    Dr. Bethann Punches is Medical Director for [facility] Cardiac Rehabilitation.  Dr. Vida Rigger is Medical Director for [facility] Pulmonary Rehabilitation.

## 2023-04-14 ENCOUNTER — Other Ambulatory Visit: Payer: Self-pay | Admitting: Family Medicine

## 2023-04-14 DIAGNOSIS — F339 Major depressive disorder, recurrent, unspecified: Secondary | ICD-10-CM

## 2023-04-14 DIAGNOSIS — F5105 Insomnia due to other mental disorder: Secondary | ICD-10-CM

## 2023-04-15 NOTE — Telephone Encounter (Signed)
Pt states she did not request it

## 2023-04-17 ENCOUNTER — Other Ambulatory Visit: Payer: Self-pay | Admitting: Family Medicine

## 2023-04-17 ENCOUNTER — Encounter: Payer: 59 | Admitting: *Deleted

## 2023-04-17 ENCOUNTER — Encounter: Payer: Self-pay | Admitting: *Deleted

## 2023-04-17 DIAGNOSIS — F5105 Insomnia due to other mental disorder: Secondary | ICD-10-CM

## 2023-04-17 DIAGNOSIS — Z951 Presence of aortocoronary bypass graft: Secondary | ICD-10-CM

## 2023-04-17 DIAGNOSIS — F339 Major depressive disorder, recurrent, unspecified: Secondary | ICD-10-CM

## 2023-04-17 DIAGNOSIS — Z48812 Encounter for surgical aftercare following surgery on the circulatory system: Secondary | ICD-10-CM | POA: Diagnosis not present

## 2023-04-17 NOTE — Progress Notes (Signed)
Daily Session Note  Patient Details  Name: Lauren [name] MRN: [id number] Date of Birth: December 07, 1975 Referring Provider:   Flowsheet Row Cardiac Rehab from 01/31/2023 in [facility] Cardiac and Pulmonary Rehab  Referring Provider Dr. [name]       Encounter Date: 04/17/2023  Check In:  Session Check In - 04/17/23 1712       Check-In   Supervising physician immediately available to respond to emergencies See telemetry face sheet for immediately available ER MD    Location [facility]-Cardiac & Pulmonary Rehab    Staff Present Susann Givens, RN BSN;Joseph [place], [place];Cora Collum, RN, BSN, CCRP    Virtual Visit No    Medication changes reported     No    Fall or balance concerns reported    No    Tobacco Cessation No Change    Current number of cigarettes/nicotine per day     6    Warm-up and Cool-down Performed on first and last piece of equipment    Resistance Training Performed Yes    VAD Patient? No    PAD/SET Patient? No      Pain Assessment   Currently in Pain? No/denies                Social History   Tobacco Use  Smoking Status Some Days   Current packs/day: 0.50   Average packs/day: 0.7 packs/day for 62.8 years (45.4 ttl pk-yrs)   Types: Cigarettes   Start date: 1990  Smokeless Tobacco Never  Tobacco Comments   Working down number of cigarettes a day  Hope to have cessation by Christmas 06/12/2023. 8-9 cigarettes a day.     Goals Met:  Independence with exercise equipment Exercise tolerated well No report of concerns or symptoms today Strength training completed today  Goals Unmet:  Not Applicable  Comments: Pt able to follow exercise prescription today without complaint.  Will continue to monitor for progression.    Dr. Bethann Punches is Medical Director for [facility] Cardiac Rehabilitation.  Dr. Vida Rigger is Medical Director for [facility] Pulmonary Rehabilitation.

## 2023-04-17 NOTE — Progress Notes (Signed)
Cardiac Individual Treatment Plan  Patient Details  Name: Lauren [name] MRN: [id number] Date of Birth: [birth year] Referring Provider:   Flowsheet Row Cardiac Rehab from 01/31/2023 in [facility] Cardiac and Pulmonary Rehab  Referring Provider Dr. [name]       Initial Encounter Date:  Flowsheet Row Cardiac Rehab from 01/31/2023 in [facility] Cardiac and Pulmonary Rehab  Date 01/31/23       Visit Diagnosis: S/P CABG x 3  Patient's Home Medications on Admission:  Current Outpatient Medications:    acetaminophen (TYLENOL) 500 MG tablet, Take 1 tablet (500 mg total) by mouth every 6 (six) hours as needed., Disp: 30 tablet, Rfl: 0   aspirin EC 81 MG tablet, Take 1 tablet (81 mg total) by mouth daily. Swallow whole., Disp: 90 tablet, Rfl: 3   b complex vitamins capsule, Take 1 capsule by mouth daily., Disp: , Rfl:    carvedilol (COREG) 6.25 MG tablet, Take 1 tablet (6.25 mg total) by mouth 2 (two) times daily., Disp: 180 tablet, Rfl: 0   cetirizine (ZYRTEC) 10 MG tablet, Take 10 mg by mouth daily as needed for allergies., Disp: , Rfl:    dapagliflozin propanediol (FARXIGA) 10 MG TABS tablet, Take 1 tablet (10 mg total) by mouth daily., Disp: 90 tablet, Rfl: 1   DULoxetine (CYMBALTA) 60 MG capsule, Take 1 capsule (60 mg total) by mouth 2 (two) times daily., Disp: 180 capsule, Rfl: 1   fluticasone (FLONASE) 50 MCG/ACT nasal spray, Place 1 spray into both nostrils daily as needed for allergies., Disp: , Rfl:    lisinopril (ZESTRIL) 2.5 MG tablet, Take 1 tablet (2.5 mg total) by mouth daily., Disp: 90 tablet, Rfl: 3   nicotine (NICODERM CQ) 7 mg/24hr patch, Place 1 patch (7 mg total) onto the skin daily., Disp: 21 patch, Rfl: 0   nitroGLYCERIN (NITROSTAT) 0.3 MG SL tablet, Place 1 tablet (0.3 mg total) under the tongue every 5 (five) minutes as needed for chest pain., Disp: 90 tablet, Rfl: 12   rosuvastatin (CRESTOR) 40 MG tablet, Take 1 tablet (40 mg total) by mouth daily., Disp: 90 tablet, Rfl: 1    Semaglutide (RYBELSUS) 7 MG TABS, Take 1 tablet (7 mg total) by mouth daily., Disp: 90 tablet, Rfl: 1   traZODone (DESYREL) 100 MG tablet, Take 1 tablet (100 mg total) by mouth at bedtime., Disp: 90 tablet, Rfl: 1   valACYclovir (VALTREX) 1000 MG tablet, TAKE 1 TABLET (1,000 MG TOTAL) BY MOUTH DAILY. TAKE AS NEEDED FOR HERPES OUTBREAK., Disp: 30 tablet, Rfl: 11  Past Medical History: Past Medical History:  Diagnosis Date   Allergic rhinitis    Anxiety    Chronic depression    Coronary artery disease involving native coronary artery without angina pectoris 10/19/2022   Cardiac cath 10/19/2022 with distal left main and ostial LAD/LCx disease as well as 95% RCA disease. => Referred for CABG x 3 LIMA-LAD, SVG-PDA, SVG-OM (10/22/2022)     Elevated LFTs    Essential hypertension 05/19/2018   Genital herpes    GERD (gastroesophageal reflux disease)    Hyperlipidemia    S/P CABG x 3: LIMA-LAD, SVG-PDA, SVG-OM 10/22/2022   Smoker 05/26/2014   Type 2 diabetes mellitus with hyperglycemia, without long-term current use of insulin (HCC) 08/18/2021   Vitamin D deficiency     Tobacco Use: Social History   Tobacco Use  Smoking Status Some Days   Current packs/day: 0.50   Average packs/day: 0.7 packs/day for 62.8 years (45.4 ttl pk-yrs)   Types:  Cigarettes   Start date: 1990  Smokeless Tobacco Never  Tobacco Comments   Working down number of cigarettes a day  Hope to have cessation by Christmas 06/12/2023. 8-9 cigarettes a day.     Labs: Review Flowsheet  More data exists      Latest Ref Rng & Units 10/21/2022 10/22/2022 10/23/2022 12/14/2022 03/18/2023  Labs for ITP Cardiac and Pulmonary Rehab  Cholestrol <200 mg/dL - - - 454  -  LDL (calc) mg/dL (calc) - - - 46  -  HDL-C > OR = 50 mg/dL - - - 40  -  Trlycerides <150 mg/dL - - - 098  -  Hemoglobin A1c <5.7 % of total Hgb 8.5  - - 6.8  6.0   PH, Arterial 7.35 - 7.45 7.44  7.322  7.340  7.271  7.328  7.301  7.396  7.302  7.387  - -  PCO2 arterial  32 - 48 mmHg 37  34.0  36.7  35.5  43.4  44.2  38.2  48.1  36.4  - -  Bicarbonate 20.0 - 28.0 mmol/L 25.1  17.5  19.7  16.4  22.9  21.8  24.6  23.4  23.7  21.8  - -  TCO2 22 - 32 mmol/L - 18  21  17  24  25  23  25  26  25  26  26  25  23   - -  Acid-base deficit 0.0 - 2.0 mmol/L - 8.0  5.0  10.0  3.0  4.0  1.0  1.0  3.0  3.0  - -  O2 Saturation % 99.5  95  97  98  95  100  76  100  100  96  - -    Details       Multiple values from one day are sorted in reverse-chronological order          Exercise Target Goals: Exercise Program Goal: Individual exercise prescription set using results from initial 6 min walk test and THRR while considering  patient's activity barriers and safety.   Exercise Prescription Goal: Initial exercise prescription builds to 30-45 minutes a day of aerobic activity, 2-3 days per week.  Home exercise guidelines will be given to patient during program as part of exercise prescription that the participant will acknowledge.   Education: Aerobic Exercise: - Group verbal and visual presentation on the components of exercise prescription. Introduces F.I.T.T principle from ACSM for exercise prescriptions.  Reviews F.I.T.T. principles of aerobic exercise including progression. Written material given at graduation. Flowsheet Row Cardiac Rehab from 02/20/2023 in [facility] Cardiac and Pulmonary Rehab  Education need identified 01/31/23       Education: Resistance Exercise: - Group verbal and visual presentation on the components of exercise prescription. Introduces F.I.T.T principle from ACSM for exercise prescriptions  Reviews F.I.T.T. principles of resistance exercise including progression. Written material given at graduation. Flowsheet Row Cardiac Rehab from 02/20/2023 in [facility] Cardiac and Pulmonary Rehab  Date 02/06/23  Educator [facility]  Instruction Review Code 1- [employer] Understanding        Education: Exercise & Equipment Safety: - Individual verbal instruction and  demonstration of equipment use and safety with use of the equipment. Flowsheet Row Cardiac Rehab from 02/20/2023 in [facility] Cardiac and Pulmonary Rehab  Date 01/31/23  Educator NT  Instruction Review Code 1- Verbalizes Understanding       Education: Exercise Physiology & General Exercise Guidelines: - Group verbal and written instruction with models to review the  exercise physiology of the cardiovascular system and associated critical values. Provides general exercise guidelines with specific guidelines to those with heart or lung disease.    Education: Flexibility, Balance, Mind/Body Relaxation: - Group verbal and visual presentation with interactive activity on the components of exercise prescription. Introduces F.I.T.T principle from ACSM for exercise prescriptions. Reviews F.I.T.T. principles of flexibility and balance exercise training including progression. Also discusses the mind body connection.  Reviews various relaxation techniques to help reduce and manage stress (i.e. Deep breathing, progressive muscle relaxation, and visualization). Balance handout provided to take home. Written material given at graduation. Flowsheet Row Cardiac Rehab from 02/20/2023 in [facility] Cardiac and Pulmonary Rehab  Date 02/06/23  Educator [facility]  Instruction Review Code 1- Verbalizes Understanding       Activity Barriers & Risk Stratification:  Activity Barriers & Cardiac Risk Stratification - 01/31/23 1608       Activity Barriers & Cardiac Risk Stratification   Activity Barriers Joint Problems;Other (comment)    Comments R hip pain    Cardiac Risk Stratification High             6 Minute Walk:  6 Minute Walk     Row Name 01/31/23 1607         6 Minute Walk   Phase Initial     Distance 1165 feet     Walk Time 6 minutes     # of Rest Breaks 0     MPH 2.21     METS 3.74     RPE 9     Perceived Dyspnea  0     VO2 Peak 13.11     Symptoms No     Resting HR 74 bpm     Resting BP 116/68      Resting Oxygen Saturation  98 %     Exercise Oxygen Saturation  during 6 min walk 99 %     Max Ex. HR 93 bpm     Max Ex. BP 122/64     2 Minute Post BP 110/64              Oxygen Initial Assessment:   Oxygen Re-Evaluation:   Oxygen Discharge (Final Oxygen Re-Evaluation):   Initial Exercise Prescription:  Initial Exercise Prescription - 01/31/23 1600       Date of Initial Exercise RX and Referring Provider   Date 01/31/23    Referring Provider Dr. [name]      Oxygen   Maintain Oxygen Saturation 88% or higher      Treadmill   MPH 2.2    Grade 0    Minutes 15    METs 2.68      NuStep   Level 2    SPM 80    Minutes 15    METs 3.74      T5 Nustep   Level 2   T6 nustep   SPM 80    Minutes 15    METs 3.74      Prescription Details   Frequency (times per week) 3    Duration Progress to 30 minutes of continuous aerobic without signs/symptoms of physical distress      Intensity   THRR 40-80% of Max Heartrate 113-153    Ratings of Perceived Exertion 11-13    Perceived Dyspnea 0-4      Progression   Progression Continue to progress workloads to maintain intensity without signs/symptoms of physical distress.      Resistance Training   Training Prescription Yes  Weight 4 lb    Reps 10-15             Perform Capillary Blood Glucose checks as needed.  Exercise Prescription Changes:   Exercise Prescription Changes     Row Name 01/31/23 1600 02/11/23 0900 03/01/23 0700 03/04/23 1700 03/12/23 1400     Response to Exercise   Blood Pressure (Admit) 116/68 110/58 98/60 -- 122/80   Blood Pressure (Exercise) 122/64 150/70 142/82 -- 134/60   Blood Pressure (Exit) 110/64 94/60 96/60  -- 120/60   Heart Rate (Admit) 74 bpm 78 bpm 91 bpm -- 87 bpm   Heart Rate (Exercise) 93 bpm 110 bpm 125 bpm -- 120 bpm   Heart Rate (Exit) 75 bpm 67 bpm 89 bpm -- 79 bpm   Oxygen Saturation (Admit) 98 % -- -- -- --   Oxygen Saturation (Exercise) 99 % -- -- -- --   Rating  of Perceived Exertion (Exercise) 9 14 14  -- 13   Perceived Dyspnea (Exercise) 0 -- -- -- --   Symptoms None none none -- none   Comments Results -- -- -- --   Duration -- Progress to 30 minutes of  aerobic without signs/symptoms of physical distress Continue with 30 min of aerobic exercise without signs/symptoms of physical distress. Continue with 30 min of aerobic exercise without signs/symptoms of physical distress. Continue with 30 min of aerobic exercise without signs/symptoms of physical distress.   Intensity -- THRR unchanged THRR unchanged THRR unchanged THRR unchanged     Progression   Progression -- Continue to progress workloads to maintain intensity without signs/symptoms of physical distress. Continue to progress workloads to maintain intensity without signs/symptoms of physical distress. Continue to progress workloads to maintain intensity without signs/symptoms of physical distress. Continue to progress workloads to maintain intensity without signs/symptoms of physical distress.   Average METs -- 2.96 3.37 3.37 3.48     Resistance Training   Training Prescription -- Yes Yes Yes Yes   Weight -- 4 5 lb 5 lb 5 lb   Reps -- 10-15 10-15 10-15 10-15     Interval Training   Interval Training -- No No No No     Treadmill   MPH -- 2.4 2.6 2.6 2.4   Grade -- 4 5 5 7    Minutes -- 15 15 15 15    METs -- 4.16 4.78 4.78 5.15     NuStep   Level -- 2  T6 4  T6 nustep 4  T6 nustep 4  T6   Minutes -- 15 15 15 15    METs -- 2 2.5 2.5 2.4     Biostep-RELP   Level -- 2 3 3 4    Minutes -- 15 15 15 15    METs -- 3 2 2 3      Home Exercise Plan   Plans to continue exercise at -- -- -- [employer] (comment)  [employer] (comment)  [employer]   Frequency -- -- -- Add 2 additional days to program exercise sessions. Add 2 additional days to program exercise sessions.   Initial Home Exercises Provided -- -- -- 03/04/23 03/04/23     Oxygen   Maintain Oxygen  Saturation -- 88% or higher 88% or higher 88% or higher 88% or higher    Row Name 03/26/23 0700 04/10/23 1100           Response to Exercise   Blood Pressure (Admit) 114/62 104/64      Blood Pressure (Exercise)  118/68 --      Blood Pressure (Exit) 106/60 96/58      Heart Rate (Admit) 77 bpm 87 bpm      Heart Rate (Exercise) 106 bpm 118 bpm      Heart Rate (Exit) 74 bpm 79 bpm      Rating of Perceived Exertion (Exercise) 13 15      Perceived Dyspnea (Exercise) 0 --      Symptoms none none      Duration Continue with 30 min of aerobic exercise without signs/symptoms of physical distress. Continue with 30 min of aerobic exercise without signs/symptoms of physical distress.      Intensity THRR unchanged THRR unchanged        Progression   Progression Continue to progress workloads to maintain intensity without signs/symptoms of physical distress. Continue to progress workloads to maintain intensity without signs/symptoms of physical distress.      Average METs 3.82 3.8        Resistance Training   Training Prescription Yes Yes      Weight 5 lb 5 lb      Reps 10-15 10-15        Interval Training   Interval Training No No        Treadmill   MPH 2.8 2.8      Grade 7 6      Minutes 15 15      METs 5.84 5.46        NuStep   Level 4  T6 4  T6 nustep      Minutes 15 15      METs 2 2.7        Biostep-RELP   Level -- 3      Minutes -- 15      METs -- 3        Home Exercise Plan   Plans to continue exercise at [employer] (comment)  [facility] ([facility]  [employer]      Frequency Add 2 additional days to program exercise sessions. Add 2 additional days to program exercise sessions.      Initial Home Exercises Provided 03/04/23 03/04/23        Oxygen   Maintain Oxygen Saturation 88% or higher 88% or higher               Exercise Comments:   Exercise Comments     Row Name 02/04/23 1725           Exercise Comments First full day  of exercise!  Patient was oriented to gym and equipment including functions, settings, policies, and procedures.  Patient's individual exercise prescription and treatment plan were reviewed.  All starting workloads were established based on the results of the 6 minute walk test done at initial orientation visit.  The plan for exercise progression was also introduced and progression will be customized based on patient's performance and goals.  .                Exercise Goals and Review:   Exercise Goals     Row Name 01/31/23 1608             Exercise Goals   Increase Physical Activity Yes       Intervention Develop an individualized exercise prescription for aerobic and resistive training based on initial evaluation findings, risk stratification, comorbidities and participant's personal goals.;Provide advice, education, support and counseling about physical activity/exercise needs.       Expected  Outcomes Short Term: Attend rehab on a regular basis to increase amount of physical activity.;Long Term: Add in home exercise to make exercise part of routine and to increase amount of physical activity.;Long Term: Exercising regularly at least 3-5 days a week.       Increase Strength and Stamina Yes       Intervention Provide advice, education, support and counseling about physical activity/exercise needs.;Develop an individualized exercise prescription for aerobic and resistive training based on initial evaluation findings, risk stratification, comorbidities and participant's personal goals.       Expected Outcomes Short Term: Perform resistance training exercises routinely during rehab and add in resistance training at home;Short Term: Increase workloads from initial exercise prescription for resistance, speed, and METs.;Long Term: Improve cardiorespiratory fitness, muscular endurance and strength as measured by increased METs and functional capacity ( )       Able to understand and use rate of  perceived exertion (RPE) scale Yes       Intervention Provide education and explanation on how to use RPE scale       Expected Outcomes Short Term: Able to use RPE daily in rehab to express subjective intensity level;Long Term:  Able to use RPE to guide intensity level when exercising independently       Able to understand and use Dyspnea scale Yes       Intervention Provide education and explanation on how to use Dyspnea scale       Expected Outcomes Short Term: Able to use Dyspnea scale daily in rehab to express subjective sense of shortness of breath during exertion       Knowledge and understanding of Target Heart Rate Range (THRR) Yes       Intervention Provide education and explanation of THRR including how the numbers were predicted and where they are located for reference       Expected Outcomes Short Term: Able to state/look up THRR;Long Term: Able to use THRR to govern intensity when exercising independently;Short Term: Able to use daily as guideline for intensity in rehab       Able to check pulse independently Yes       Intervention Provide education and demonstration on how to check pulse in carotid and radial arteries.;Review the importance of being able to check your own pulse for safety during independent exercise       Expected Outcomes Short Term: Able to explain why pulse checking is important during independent exercise;Long Term: Able to check pulse independently and accurately       Understanding of Exercise Prescription Yes       Intervention Provide education, explanation, and written materials on patient's individual exercise prescription       Expected Outcomes Short Term: Able to explain program exercise prescription;Long Term: Able to explain home exercise prescription to exercise independently                Exercise Goals Re-Evaluation :  Exercise Goals Re-Evaluation     Row Name 02/04/23 1726 02/11/23 0946 03/01/23 0727 03/04/23 1751 03/12/23 1446      Exercise Goal Re-Evaluation   Exercise Goals Review Increase Physical Activity;Able to understand and use rate of perceived exertion (RPE) scale;Knowledge and understanding of Target Heart Rate Range (THRR);Understanding of Exercise Prescription;Increase Strength and Stamina;Able to check pulse independently Increase Physical Activity;Increase Strength and Stamina;Understanding of Exercise Prescription Increase Physical Activity;Increase Strength and Stamina;Understanding of Exercise Prescription Increase Physical Activity;Able to understand and use rate of perceived exertion (RPE) scale;Able to  understand and use Dyspnea scale;Knowledge and understanding of Target Heart Rate Range (THRR);Able to check pulse independently;Increase Strength and Stamina;Understanding of Exercise Prescription Increase Physical Activity;Increase Strength and Stamina;Understanding of Exercise Prescription   Comments Reviewed RPE  and dyspnea scale, THR and program prescription with pt today.  Pt voiced understanding and was given a copy of goals to take home. Aslynn is off to a good start in the program. She recently attended her first session and continues to become familiar with the program and her exercise prescription. She has increased her treadmill grade from 0% to 4% and has added the Biostep to her current prescription. We will continue to monitor her progress in the program. Loritta is doing well in the program. She recently increased her treadmill workload to a speed of 2.6 mph with a 5% incline. She also improved to level 4 on the T6 nustep and level 3 on the biostep. She increased from 4 lb to 5 lb hand weights for resistance training as well. We will continue to monitor her progress in the program. Reviewed home exercise with pt today.  Pt plans to walk and go to [employer] for exercise.  Reviewed THR, pulse, RPE, sign and symptoms, pulse oximetery and when to call 911 or MD.  Also discussed weather considerations and  indoor options.  Pt voiced understanding. Victorian continues to do well in rehab. She increased her treadmill workload to a speed of 2.4 mph with an incline of 7%. She also continues to work at level 4 on the T6 nustep and improved to level 6 on the biostep. We will continue to monitor her progress in the program.   Expected Outcomes Short: Use RPE daily to regulate intensity.  Long: Follow program prescription in THR. Short: Continue to follow current exercise prescription, and progressively increase workloads. Long: Continue exercise to improve strength and stamina. Short: Continue to progressively increase workloads. Long: Continue exercise to improve strength and stamina. Short: add 1-2 days per week of exercise at home on off days of rehab. Long: become independent with exercise routine. Short: Try level 5 on the T6 nustep. Long: Continue to increase overall METs and stamina.    Row Name 03/26/23 0758 04/10/23 1123           Exercise Goal Re-Evaluation   Exercise Goals Review Increase Physical Activity;Increase Strength and Stamina;Understanding of Exercise Prescription Increase Physical Activity;Increase Strength and Stamina;Understanding of Exercise Prescription      Comments Letesha continues to do well in rehab. She has been able to increase her speed on the treadmill from 2.4 to 2.23mph. She also has been able to maintain intensity of level 4 the T6 nustep. We will continue to monitor her progress in the program. Seleena is doing well in rehab. She has stayed consistent on the treadmill at a speed of 2.8 mph and an incline of 6%. She also continues to work at level 3 on the biostep and level 4 on the T6 nustep. We will continue to monitor her progress in the program.      Expected Outcomes Short: Try level 5 on the T6 nustep. Long: Continue to increase overall METs and stamina. Short: Try level 5 on the T6 nustep. Long: Continue to increase overall METs and stamina.               Discharge  Exercise Prescription (Final Exercise Prescription Changes):  Exercise Prescription Changes - 04/10/23 1100       Response to Exercise  Blood Pressure (Admit) 104/64    Blood Pressure (Exit) 96/58    Heart Rate (Admit) 87 bpm    Heart Rate (Exercise) 118 bpm    Heart Rate (Exit) 79 bpm    Rating of Perceived Exertion (Exercise) 15    Symptoms none    Duration Continue with 30 min of aerobic exercise without signs/symptoms of physical distress.    Intensity THRR unchanged      Progression   Progression Continue to progress workloads to maintain intensity without signs/symptoms of physical distress.    Average METs 3.8      Resistance Training   Training Prescription Yes    Weight 5 lb    Reps 10-15      Interval Training   Interval Training No      Treadmill   MPH 2.8    Grade 6    Minutes 15    METs 5.46      NuStep   Level 4   T6 nustep   Minutes 15    METs 2.7      Biostep-RELP   Level 3    Minutes 15    METs 3      Home Exercise Plan   Plans to continue exercise at [employer] (comment)   [employer]   Frequency Add 2 additional days to program exercise sessions.    Initial Home Exercises Provided 03/04/23      Oxygen   Maintain Oxygen Saturation 88% or higher             Nutrition:  Target Goals: Understanding of nutrition guidelines, daily intake of sodium 1500mg , cholesterol 200mg , calories 30% from fat and 7% or less from saturated fats, daily to have 5 or more servings of fruits and vegetables.  Education: All About Nutrition: -Group instruction provided by verbal, written material, interactive activities, discussions, models, and posters to present general guidelines for heart healthy nutrition including fat, fiber, MyPlate, the role of sodium in heart healthy nutrition, utilization of the nutrition label, and utilization of this knowledge for meal planning. Follow up email sent as well. Written material given at  graduation. Flowsheet Row Cardiac Rehab from 02/20/2023 in [facility] Cardiac and Pulmonary Rehab  Education need identified 01/31/23  Date 02/20/23  Educator JG  Instruction Review Code 1- Verbalizes Understanding       Biometrics:  Pre Biometrics - 01/31/23 1609       Pre Biometrics   Height 5' 4.5" (1.638 m)    Weight 162 lb 4.8 oz (73.6 kg)    Waist Circumference 37 inches    Hip Circumference 38 inches    Waist to Hip Ratio 0.97 %    BMI (Calculated) 27.44    Single Leg Stand 25 seconds              Nutrition Therapy Plan and Nutrition Goals:   Nutrition Assessments:  MEDIFICTS Score Key: >=70 Need to make dietary changes  40-70 Heart Healthy Diet <= 40 Therapeutic Level Cholesterol Diet  Flowsheet Row Cardiac Rehab from 01/31/2023 in [facility] Cardiac and Pulmonary Rehab  Picture Your Plate Total Score on Admission 38      Picture Your Plate Scores: <62 Unhealthy dietary pattern with much room for improvement. 41-50 Dietary pattern unlikely to meet recommendations for good health and room for improvement. 51-60 More healthful dietary pattern, with some room for improvement.  >60 Healthy dietary pattern, although there may be some specific behaviors that could be improved.  Nutrition Goals Re-Evaluation:  Nutrition Goals Re-Evaluation     Row Name 03/04/23 1755             Goals   Nutrition Goal reschedule missed appointment with RD       Comment reschedule missed appointment with RD       Expected Outcome Short: meet with RD. Long: establish and follow goals set by RD.                Nutrition Goals Discharge (Final Nutrition Goals Re-Evaluation):  Nutrition Goals Re-Evaluation - 03/04/23 1755       Goals   Nutrition Goal reschedule missed appointment with RD    Comment reschedule missed appointment with RD    Expected Outcome Short: meet with RD. Long: establish and follow goals set by RD.             Psychosocial: Target Goals:  Acknowledge presence or absence of significant depression and/or stress, maximize coping skills, provide positive support system. Participant is able to verbalize types and ability to use techniques and skills needed for reducing stress and depression.   Education: Stress, Anxiety, and Depression - Group verbal and visual presentation to define topics covered.  Reviews how body is impacted by stress, anxiety, and depression.  Also discusses healthy ways to reduce stress and to treat/manage anxiety and depression.  Written material given at graduation.   Education: Sleep Hygiene -Provides group verbal and written instruction about how sleep can affect your health.  Define sleep hygiene, discuss sleep cycles and impact of sleep habits. Review good sleep hygiene tips.    Initial Review & Psychosocial Screening:  Initial Psych Review & Screening - 01/07/23 1027       Initial Review   Current issues with None Identified      Family Dynamics   Good Support System? Yes   daughter, son, mama     Barriers   Psychosocial barriers to participate in program There are no identifiable barriers or psychosocial needs.      Screening Interventions   Interventions Encouraged to exercise;To provide support and resources with identified psychosocial needs;Provide feedback about the scores to participant    Expected Outcomes Short Term goal: Utilizing psychosocial counselor, staff and physician to assist with identification of specific Stressors or current issues interfering with healing process. Setting desired goal for each stressor or current issue identified.;Long Term Goal: Stressors or current issues are controlled or eliminated.;Short Term goal: Identification and review with participant of any Quality of Life or Depression concerns found by scoring the questionnaire.;Long Term goal: The participant improves quality of Life and PHQ9 Scores as seen by post scores and/or verbalization of changes              Quality of Life Scores:   Quality of Life - 01/31/23 1556       Quality of Life   Select Quality of Life      Quality of Life Scores   Health/Function Pre 23.2 %    Socioeconomic Pre 23.63 %    Psych/Spiritual Pre 24.79 %    Family Pre 24 %    GLOBAL Pre 23.73 %            Scores of 19 and below usually indicate a poorer quality of life in these areas.  A difference of  2-3 points is a clinically meaningful difference.  A difference of 2-3 points in the total score of the Quality of Life Index has been associated  with significant improvement in overall quality of life, self-image, physical symptoms, and general health in studies assessing change in quality of life.  PHQ-9: Review Flowsheet  More data exists      03/18/2023 01/31/2023 01/11/2023 12/14/2022 11/06/2022  Depression screen PHQ 2/9  Decreased Interest 0 1 1 1 1   Down, Depressed, Hopeless 0 0 0 1 1  PHQ - 2 Score 0 1 1 2 2   Altered sleeping 0 1 1 0 0  Tired, decreased energy 0 1 1 0 0  Change in appetite 0 1 0 0 0  Feeling bad or failure about yourself  0 0 1 1 0  Trouble concentrating 0 0 0 0 0  Moving slowly or fidgety/restless 0 0 0 0 0  Suicidal thoughts 0 0 0 0 0  PHQ-9 Score 0 4 4 3 2   Difficult doing work/chores Not difficult at all Not difficult at all Not difficult at all Somewhat difficult Somewhat difficult    Details           Interpretation of Total Score  Total Score Depression Severity:  1-4 = Minimal depression, 5-9 = Mild depression, 10-14 = Moderate depression, 15-19 = Moderately severe depression, 20-27 = Severe depression   Psychosocial Evaluation and Intervention:  Psychosocial Evaluation - 01/07/23 1033       Psychosocial Evaluation & Interventions   Interventions Encouraged to exercise with the program and follow exercise prescription    Comments Shakeera has no barriers to attending the program.  She has a good support system with her daughter, son and her Mother.  She  hopes to learn her heart rate limits and how to manage her heart disease so she can return to hiking and visiting waterfalls.    Expected Outcomes STG Tillman Sers is able to attend all scheduled sessions and is able to see progression with her exercise. Her goal is to have stamina to get back to hiking and visiting waterfalls. LTG Venecia has returned to her hikes and visiting warefalls without any cardiac concerns.    Continue Psychosocial Services  Follow up required by staff             Psychosocial Re-Evaluation:  Psychosocial Re-Evaluation     Row Name 03/04/23 1757             Psychosocial Re-Evaluation   Current issues with None Identified       Comments Chesley has no new sleep, stress, or mental health concerns.       Expected Outcomes Short: continue to attend cardiac rehab consistently for mental health benefits. Long: maintain good mental health routine.       Interventions Encouraged to attend Cardiac Rehabilitation for the exercise       Continue Psychosocial Services  Follow up required by staff                Psychosocial Discharge (Final Psychosocial Re-Evaluation):  Psychosocial Re-Evaluation - 03/04/23 1757       Psychosocial Re-Evaluation   Current issues with None Identified    Comments Meegan has no new sleep, stress, or mental health concerns.    Expected Outcomes Short: continue to attend cardiac rehab consistently for mental health benefits. Long: maintain good mental health routine.    Interventions Encouraged to attend Cardiac Rehabilitation for the exercise    Continue Psychosocial Services  Follow up required by staff             Vocational Rehabilitation: Provide vocational rehab assistance to qualifying candidates.  Vocational Rehab Evaluation & Intervention:  Vocational Rehab - 03/04/23 1756       Initial Vocational Rehab Evaluation & Intervention   Assessment shows need for Vocational Rehabilitation No      Vocational Rehab  Re-Evaulation   Comments currently back at work             Education: Education Goals: Education classes will be provided on a variety of topics geared toward better understanding of heart health and risk factor modification. Participant will state understanding/return demonstration of topics presented as noted by education test scores.  Learning Barriers/Preferences:  Learning Barriers/Preferences - 01/07/23 1029       Learning Barriers/Preferences   Learning Barriers None    Learning Preferences None             General Cardiac Education Topics:  AED/CPR: - Group verbal and written instruction with the use of models to demonstrate the basic use of the AED with the basic ABC's of resuscitation.   Anatomy and Cardiac Procedures: - Group verbal and visual presentation and models provide information about basic cardiac anatomy and function. Reviews the testing methods done to diagnose heart disease and the outcomes of the test results. Describes the treatment choices: Medical Management, Angioplasty, or Coronary Bypass Surgery for treating various heart conditions including Myocardial Infarction, Angina, Valve Disease, and Cardiac Arrhythmias.  Written material given at graduation. Flowsheet Row Cardiac Rehab from 02/20/2023 in [facility] Cardiac and Pulmonary Rehab  Education need identified 01/31/23       Medication Safety: - Group verbal and visual instruction to review commonly prescribed medications for heart and lung disease. Reviews the medication, class of the drug, and side effects. Includes the steps to properly store meds and maintain the prescription regimen.  Written material given at graduation.   Intimacy: - Group verbal instruction through game format to discuss how heart and lung disease can affect sexual intimacy. Written material given at graduation..   Know Your Numbers and Heart Failure: - Group verbal and visual instruction to discuss disease risk factors  for cardiac and pulmonary disease and treatment options.  Reviews associated critical values for Overweight/Obesity, Hypertension, Cholesterol, and Diabetes.  Discusses basics of heart failure: signs/symptoms and treatments.  Introduces Heart Failure Zone chart for action plan for heart failure.  Written material given at graduation.   Infection Prevention: - Provides verbal and written material to individual with discussion of infection control including proper hand washing and proper equipment cleaning during exercise session. Flowsheet Row Cardiac Rehab from 02/20/2023 in [facility] Cardiac and Pulmonary Rehab  Date 01/31/23  Educator NT  Instruction Review Code 1- Verbalizes Understanding       Falls Prevention: - Provides verbal and written material to individual with discussion of falls prevention and safety. Flowsheet Row Cardiac Rehab from 02/20/2023 in [facility] Cardiac and Pulmonary Rehab  Date 01/07/23  Educator SB  Instruction Review Code 1- Verbalizes Understanding       Other: -Provides group and verbal instruction on various topics (see comments)   Knowledge Questionnaire Score:  Knowledge Questionnaire Score - 01/31/23 1556       Knowledge Questionnaire Score   Pre Score 20/26             Core Components/Risk Factors/Patient Goals at Admission:  Personal Goals and Risk Factors at Admission - 01/07/23 1131       Core Components/Risk Factors/Patient Goals on Admission   Number of packs per day Smoking cessation instruction/counseling given:  counseled patient on the  dangers of tobacco use, advised patient to stop smoking, and reviewed strategies to maximize success. Darshae is a current tobacco user. Intervention for tobacco cessation was provided at the initial medical review. She was asked about readiness to quit and reported that she is weaning slowly to a Quit date of 06/12/2023 . Patient was advised and educated about tobacco cessation using combination therapy,  tobacco cessation classes, quit line, and quit smoking apps. Patient demonstrated understanding of this material. Staff will continue to provide encouragement and follow up with the patient throughout the program.             Education:Diabetes - Individual verbal and written instruction to review signs/symptoms of diabetes, desired ranges of glucose level fasting, after meals and with exercise. Acknowledge that pre and post exercise glucose checks will be done for 3 sessions at entry of program. Flowsheet Row Cardiac Rehab from 02/20/2023 in [facility] Cardiac and Pulmonary Rehab  Date 01/31/23  Educator NT  Instruction Review Code 1- Verbalizes Understanding       Core Components/Risk Factors/Patient Goals Review:   Goals and Risk Factor Review     Row Name 03/04/23 1758             Core Components/Risk Factors/Patient Goals Review   Personal Goals Review Diabetes;Tobacco Cessation       Review Patient reported that she continues to use nicotine patch and it is helpful. She has cut back to about 6 cigarettes a day. She also monitors her blood sugars at home and states they are under control. Her las A1C is reported to be around 6.9.       Expected Outcomes Short: continue to monitor blood sugars at home and maintain A1C below 7%. Continue to cut back on amount of cigarettes.. Long: quit smoking.                Core Components/Risk Factors/Patient Goals at Discharge (Final Review):   Goals and Risk Factor Review - 03/04/23 1758       Core Components/Risk Factors/Patient Goals Review   Personal Goals Review Diabetes;Tobacco Cessation    Review Patient reported that she continues to use nicotine patch and it is helpful. She has cut back to about 6 cigarettes a day. She also monitors her blood sugars at home and states they are under control. Her las A1C is reported to be around 6.9.    Expected Outcomes Short: continue to monitor blood sugars at home and maintain A1C below 7%.  Continue to cut back on amount of cigarettes.. Long: quit smoking.             ITP Comments:  ITP Comments     Row Name 01/07/23 1131 01/31/23 1553 02/04/23 1725 02/20/23 1056 03/20/23 1222   ITP Comments irtual orientation call completed today. shehas an appointment on Date: 01/31/2023  for EP eval and gym Orientation.  Documentation of diagnosis can be found in [facility] 10/19/2022 .   Smoking cessation instruction/counseling given:  counseled patient on the dangers of tobacco use, advised patient to stop smoking, and reviewed strategies to maximize success. Fajr is a current tobacco user. Intervention for tobacco cessation was provided at the initial medical review. She was asked about readiness to quit and reported that she is weaning slowly to a Quit date of 06/12/2023 . Patient was advised and educated about tobacco cessation using combination therapy, tobacco cessation classes, quit line, and quit smoking apps. Patient demonstrated understanding of this material. Staff will continue to provide  encouragement and follow up with the patient throughout the program. Completed and gym orientation. Initial ITP created and sent for review to Dr. Bethann Punches, Medical Director. First full day of exercise!  Patient was oriented to gym and equipment including functions, settings, policies, and procedures.  Patient's individual exercise prescription and treatment plan were reviewed.  All starting workloads were established based on the results of the 6 minute walk test done at initial orientation visit.  The plan for exercise progression was also introduced and progression will be customized based on patient's performance and goals.  . 30 Day review completed. Medical Director ITP review done, changes made as directed, and signed approval by Medical Director.     new to program 30 Day review completed. Medical Director ITP review done, changes made as directed, and signed approval by Medical Director.     Row Name 04/17/23 1311           ITP Comments 30 Day review completed. Medical Director ITP review done, changes made as directed, and signed approval by Medical Director.                Comments:

## 2023-04-18 ENCOUNTER — Encounter: Payer: 59 | Admitting: *Deleted

## 2023-04-18 DIAGNOSIS — Z951 Presence of aortocoronary bypass graft: Secondary | ICD-10-CM

## 2023-04-18 DIAGNOSIS — Z48812 Encounter for surgical aftercare following surgery on the circulatory system: Secondary | ICD-10-CM | POA: Diagnosis not present

## 2023-04-18 NOTE — Progress Notes (Signed)
Daily Session Note  Patient Details  Name: Lauren [name] MRN: [id number] Date of Birth: 06-Jul-1975 Referring Provider:   Flowsheet Row Cardiac Rehab from 01/31/2023 in [facility] Cardiac and Pulmonary Rehab  Referring Provider Dr. [name]       Encounter Date: 04/18/2023  Check In:  Session Check In - 04/18/23 1712       Check-In   Supervising physician immediately available to respond to emergencies See telemetry face sheet for immediately available ER MD    Location [facility]-Cardiac & Pulmonary Rehab    Staff Present Elige Ko, RCP,RRT,BSRT;Maxon Conetta BS, , Exercise Physiologist;Thurl Boen Katrinka Blazing, RN, ADN    Virtual Visit No    Medication changes reported     No    Fall or balance concerns reported    No    Tobacco Cessation No Change    Warm-up and Cool-down Performed on first and last piece of equipment    Resistance Training Performed Yes    VAD Patient? No    PAD/SET Patient? No      Pain Assessment   Currently in Pain? No/denies                Social History   Tobacco Use  Smoking Status Some Days   Current packs/day: 0.50   Average packs/day: 0.7 packs/day for 62.8 years (45.4 ttl pk-yrs)   Types: Cigarettes   Start date: 1990  Smokeless Tobacco Never  Tobacco Comments   Working down number of cigarettes a day  Hope to have cessation by Christmas 06/12/2023. 8-9 cigarettes a day.     Goals Met:  Independence with exercise equipment Exercise tolerated well No report of concerns or symptoms today Strength training completed today  Goals Unmet:  Not Applicable  Comments: Pt able to follow exercise prescription today without complaint.  Will continue to monitor for progression.    Dr. Bethann Punches is Medical Director for [facility] Cardiac Rehabilitation.  Dr. Vida Rigger is Medical Director for [facility] Pulmonary Rehabilitation.

## 2023-04-19 DIAGNOSIS — Z419 Encounter for procedure for purposes other than remedying health state, unspecified: Secondary | ICD-10-CM | POA: Diagnosis not present

## 2023-04-22 ENCOUNTER — Encounter: Payer: 59 | Attending: Cardiology

## 2023-04-22 DIAGNOSIS — Z48812 Encounter for surgical aftercare following surgery on the circulatory system: Secondary | ICD-10-CM | POA: Insufficient documentation

## 2023-04-22 DIAGNOSIS — Z951 Presence of aortocoronary bypass graft: Secondary | ICD-10-CM | POA: Insufficient documentation

## 2023-04-29 ENCOUNTER — Encounter: Payer: 59 | Admitting: *Deleted

## 2023-04-29 DIAGNOSIS — Z951 Presence of aortocoronary bypass graft: Secondary | ICD-10-CM | POA: Diagnosis present

## 2023-04-29 DIAGNOSIS — Z48812 Encounter for surgical aftercare following surgery on the circulatory system: Secondary | ICD-10-CM | POA: Diagnosis not present

## 2023-04-29 NOTE — Progress Notes (Signed)
Daily Session Note  Patient Details  Name: Lauren [name] MRN: [id number] Date of Birth: [birth year] Referring Provider:   Flowsheet Row Cardiac Rehab from 01/31/2023 in [facility] Cardiac and Pulmonary Rehab  Referring Provider Dr. [name]       Encounter Date: 04/29/2023  Check In:  Session Check In - 04/29/23 1709       Check-In   Supervising physician immediately available to respond to emergencies See telemetry face sheet for immediately available ER MD    Location [facility]-Cardiac & Pulmonary Rehab    Staff Present Tommye Standard, BS, ACSM CEP, Exercise Physiologist;Harlon Kutner Jewel Baize, RN BSN;Joseph Reino Kent, RCP,RRT,BSRT    Virtual Visit No    Medication changes reported     No    Fall or balance concerns reported    No    Tobacco Cessation No Change    Current number of cigarettes/nicotine per day     6    Warm-up and Cool-down Performed on first and last piece of equipment    Resistance Training Performed Yes    VAD Patient? No    PAD/SET Patient? No      Pain Assessment   Currently in Pain? No/denies                Social History   Tobacco Use  Smoking Status Some Days   Current packs/day: 0.50   Average packs/day: 0.7 packs/day for 62.9 years (45.4 ttl pk-yrs)   Types: Cigarettes   Start date: 1990  Smokeless Tobacco Never  Tobacco Comments   Working down number of cigarettes a day  Hope to have cessation by Christmas 06/12/2023. 8-9 cigarettes a day.     Goals Met:  Independence with exercise equipment Exercise tolerated well No report of concerns or symptoms today Strength training completed today  Goals Unmet:  Not Applicable  Comments: Pt able to follow exercise prescription today without complaint.  Will continue to monitor for progression.    Dr. Bethann Punches is Medical Director for [facility] Cardiac Rehabilitation.  Dr. Vida Rigger is Medical Director for [facility] Pulmonary Rehabilitation.

## 2023-05-01 ENCOUNTER — Encounter: Payer: 59 | Admitting: *Deleted

## 2023-05-01 DIAGNOSIS — Z951 Presence of aortocoronary bypass graft: Secondary | ICD-10-CM

## 2023-05-01 DIAGNOSIS — Z48812 Encounter for surgical aftercare following surgery on the circulatory system: Secondary | ICD-10-CM | POA: Diagnosis not present

## 2023-05-01 NOTE — Progress Notes (Signed)
Daily Session Note  Patient Details  Name: Lauren [name] MRN: [id number] Date of Birth: April 29, 1976 Referring Provider:   Flowsheet Row Cardiac Rehab from 01/31/2023 in [facility] Cardiac and Pulmonary Rehab  Referring Provider Dr. [name]       Encounter Date: 05/01/2023  Check In:  Session Check In - 05/01/23 1728       Check-In   Supervising physician immediately available to respond to emergencies See telemetry face sheet for immediately available ER MD    Location [facility]-Cardiac & Pulmonary Rehab    Staff Present Susann Givens, RN BSN;Joseph Stoutland, RCP,RRT,BSRT;Kelly [place], [place], ACSM CEP, Exercise Physiologist    Virtual Visit No    Medication changes reported     No    Fall or balance concerns reported    No    Tobacco Cessation No Change    Current number of cigarettes/nicotine per day     6    Warm-up and Cool-down Performed on first and last piece of equipment    Resistance Training Performed Yes    VAD Patient? No    PAD/SET Patient? No      Pain Assessment   Currently in Pain? No/denies                Social History   Tobacco Use  Smoking Status Some Days   Current packs/day: 0.50   Average packs/day: 0.7 packs/day for 62.9 years (45.4 ttl pk-yrs)   Types: Cigarettes   Start date: 1990  Smokeless Tobacco Never  Tobacco Comments   Working down number of cigarettes a day  Hope to have cessation by Christmas 06/12/2023. 8-9 cigarettes a day.     Goals Met:  Independence with exercise equipment Exercise tolerated well No report of concerns or symptoms today Strength training completed today  Goals Unmet:  Not Applicable  Comments: Pt able to follow exercise prescription today without complaint.  Will continue to monitor for progression.    Dr. Bethann Punches is Medical Director for [facility] Cardiac Rehabilitation.  Dr. Vida Rigger is Medical Director for [facility] Pulmonary Rehabilitation.

## 2023-05-08 ENCOUNTER — Encounter: Payer: Self-pay | Admitting: *Deleted

## 2023-05-08 DIAGNOSIS — Z951 Presence of aortocoronary bypass graft: Secondary | ICD-10-CM

## 2023-05-08 NOTE — Progress Notes (Signed)
Cardiac Individual Treatment Plan  Patient Details  Name: Lauren [name] MRN: [id number] Date of Birth: 11-22-1975 Referring Provider:   Flowsheet Row Cardiac Rehab from 01/31/2023 in [facility] Cardiac and Pulmonary Rehab  Referring Provider Dr. [name]       Initial Encounter Date:  Flowsheet Row Cardiac Rehab from 01/31/2023 in [facility] Cardiac and Pulmonary Rehab  Date 01/31/23       Visit Diagnosis: S/P CABG x 3  Patient's Home Medications on Admission:  Current Outpatient Medications:    acetaminophen (TYLENOL) 500 MG tablet, Take 1 tablet (500 mg total) by mouth every 6 (six) hours as needed., Disp: 30 tablet, Rfl: 0   aspirin EC 81 MG tablet, Take 1 tablet (81 mg total) by mouth daily. Swallow whole., Disp: 90 tablet, Rfl: 3   b complex vitamins capsule, Take 1 capsule by mouth daily., Disp: , Rfl:    carvedilol (COREG) 6.25 MG tablet, Take 1 tablet (6.25 mg total) by mouth 2 (two) times daily., Disp: 180 tablet, Rfl: 0   cetirizine (ZYRTEC) 10 MG tablet, Take 10 mg by mouth daily as needed for allergies., Disp: , Rfl:    dapagliflozin propanediol (FARXIGA) 10 MG TABS tablet, Take 1 tablet (10 mg total) by mouth daily., Disp: 90 tablet, Rfl: 1   DULoxetine (CYMBALTA) 60 MG capsule, Take 1 capsule (60 mg total) by mouth 2 (two) times daily., Disp: 180 capsule, Rfl: 1   fluticasone (FLONASE) 50 MCG/ACT nasal spray, Place 1 spray into both nostrils daily as needed for allergies., Disp: , Rfl:    lisinopril (ZESTRIL) 2.5 MG tablet, Take 1 tablet (2.5 mg total) by mouth daily., Disp: 90 tablet, Rfl: 3   nicotine (NICODERM CQ) 7 mg/24hr patch, Place 1 patch (7 mg total) onto the skin daily., Disp: 21 patch, Rfl: 0   nitroGLYCERIN (NITROSTAT) 0.3 MG SL tablet, Place 1 tablet (0.3 mg total) under the tongue every 5 (five) minutes as needed for chest pain., Disp: 90 tablet, Rfl: 12   rosuvastatin (CRESTOR) 40 MG tablet, Take 1 tablet (40 mg total) by mouth daily., Disp: 90 tablet, Rfl: 1    Semaglutide (RYBELSUS) 7 MG TABS, Take 1 tablet (7 mg total) by mouth daily., Disp: 90 tablet, Rfl: 1   traZODone (DESYREL) 100 MG tablet, Take 1 tablet (100 mg total) by mouth at bedtime., Disp: 90 tablet, Rfl: 1   valACYclovir (VALTREX) 1000 MG tablet, TAKE 1 TABLET (1,000 MG TOTAL) BY MOUTH DAILY. TAKE AS NEEDED FOR HERPES OUTBREAK., Disp: 30 tablet, Rfl: 11  Past Medical History: Past Medical History:  Diagnosis Date   Allergic rhinitis    Anxiety    Chronic depression    Coronary artery disease involving native coronary artery without angina pectoris 10/19/2022   Cardiac cath 10/19/2022 with distal left main and ostial LAD/LCx disease as well as 95% RCA disease. => Referred for CABG x 3 LIMA-LAD, SVG-PDA, SVG-OM (10/22/2022)     Elevated LFTs    Essential hypertension 05/19/2018   Genital herpes    GERD (gastroesophageal reflux disease)    Hyperlipidemia    S/P CABG x 3: LIMA-LAD, SVG-PDA, SVG-OM 10/22/2022   Smoker 05/26/2014   Type 2 diabetes mellitus with hyperglycemia, without long-term current use of insulin (HCC) 08/18/2021   Vitamin D deficiency     Tobacco Use: Social History   Tobacco Use  Smoking Status Some Days   Current packs/day: 0.50   Average packs/day: 0.7 packs/day for 62.9 years (45.4 ttl pk-yrs)   Types:  Cigarettes   Start date: 1990  Smokeless Tobacco Never  Tobacco Comments   Working down number of cigarettes a day  Hope to have cessation by Christmas 06/12/2023. 8-9 cigarettes a day.     Labs: Review Flowsheet  More data exists      Latest Ref Rng & Units 10/21/2022 10/22/2022 10/23/2022 12/14/2022 03/18/2023  Labs for ITP Cardiac and Pulmonary Rehab  Cholestrol <200 mg/dL - - - 130  -  LDL (calc) mg/dL (calc) - - - 46  -  HDL-C > OR = 50 mg/dL - - - 40  -  Trlycerides <150 mg/dL - - - 865  -  Hemoglobin A1c <5.7 % of total Hgb 8.5  - - 6.8  6.0   PH, Arterial 7.35 - 7.45 7.44  7.322  7.340  7.271  7.328  7.301  7.396  7.302  7.387  - -  PCO2 arterial  32 - 48 mmHg 37  34.0  36.7  35.5  43.4  44.2  38.2  48.1  36.4  - -  Bicarbonate 20.0 - 28.0 mmol/L 25.1  17.5  19.7  16.4  22.9  21.8  24.6  23.4  23.7  21.8  - -  TCO2 22 - 32 mmol/L - 18  21  17  24  25  23  25  26  25  26  26  25  23   - -  Acid-base deficit 0.0 - 2.0 mmol/L - 8.0  5.0  10.0  3.0  4.0  1.0  1.0  3.0  3.0  - -  O2 Saturation % 99.5  95  97  98  95  100  76  100  100  96  - -    Details       Multiple values from one day are sorted in reverse-chronological order          Exercise Target Goals: Exercise Program Goal: Individual exercise prescription set using results from initial 6 min walk test and THRR while considering  patient's activity barriers and safety.   Exercise Prescription Goal: Initial exercise prescription builds to 30-45 minutes a day of aerobic activity, 2-3 days per week.  Home exercise guidelines will be given to patient during program as part of exercise prescription that the participant will acknowledge.   Education: Aerobic Exercise: - Group verbal and visual presentation on the components of exercise prescription. Introduces F.I.T.T principle from ACSM for exercise prescriptions.  Reviews F.I.T.T. principles of aerobic exercise including progression. Written material given at graduation. Flowsheet Row Cardiac Rehab from 02/20/2023 in [facility] Cardiac [facility] Pulmonary Rehab  Education need identified 01/31/23       Education: Resistance Exercise: - Group verbal and visual presentation on the components of exercise prescription. Introduces F.I.T.T principle from ACSM for exercise prescriptions  Reviews F.I.T.T. principles of resistance exercise including progression. Written material given at graduation. Flowsheet Row Cardiac Rehab from 02/20/2023 in [facility] Cardiac and Pulmonary Rehab  Date 02/06/23  Educator [facility]  Instruction Review Code 1- [employer] Understanding        Education: Exercise & Equipment Safety: - Individual verbal instruction and  demonstration of equipment use and safety with use of the equipment. Flowsheet Row Cardiac Rehab from 02/20/2023 in [facility] Cardiac and Pulmonary Rehab  Date 01/31/23  Educator NT  Instruction Review Code 1- Verbalizes Understanding       Education: Exercise Physiology & General Exercise Guidelines: - Group verbal and written instruction with models to review the  exercise physiology of the cardiovascular system and associated critical values. Provides general exercise guidelines with specific guidelines to those with heart or lung disease.    Education: Flexibility, Balance, Mind/Body Relaxation: - Group verbal and visual presentation with interactive activity on the components of exercise prescription. Introduces F.I.T.T principle from ACSM for exercise prescriptions. Reviews F.I.T.T. principles of flexibility and balance exercise training including progression. Also discusses the mind body connection.  Reviews various relaxation techniques to help reduce and manage stress (i.e. Deep breathing, progressive muscle relaxation, and visualization). Balance handout provided to take home. Written material given at graduation. Flowsheet Row Cardiac Rehab from 02/20/2023 in [facility] Cardiac and Pulmonary Rehab  Date 02/06/23  Educator [facility]  Instruction Review Code 1- Verbalizes Understanding       Activity Barriers & Risk Stratification:  Activity Barriers & Cardiac Risk Stratification - 01/31/23 1608       Activity Barriers & Cardiac Risk Stratification   Activity Barriers Joint Problems;Other (comment)    Comments R hip pain    Cardiac Risk Stratification High             6 Minute Walk:  6 Minute Walk     Row Name 01/31/23 1607         6 Minute Walk   Phase Initial     Distance 1165 feet     Walk Time 6 minutes     # of Rest Breaks 0     MPH 2.21     METS 3.74     RPE 9     Perceived Dyspnea  0     VO2 Peak 13.11     Symptoms No     Resting HR 74 bpm     Resting BP 116/68      Resting Oxygen Saturation  98 %     Exercise Oxygen Saturation  during 6 min walk 99 %     Max Ex. HR 93 bpm     Max Ex. BP 122/64     2 Minute Post BP 110/64              Oxygen Initial Assessment:   Oxygen Re-Evaluation:   Oxygen Discharge (Final Oxygen Re-Evaluation):   Initial Exercise Prescription:  Initial Exercise Prescription - 01/31/23 1600       Date of Initial Exercise RX and Referring Provider   Date 01/31/23    Referring Provider Dr. [name]      Oxygen   Maintain Oxygen Saturation 88% or higher      Treadmill   MPH 2.2    Grade 0    Minutes 15    METs 2.68      NuStep   Level 2    SPM 80    Minutes 15    METs 3.74      T5 Nustep   Level 2   T6 nustep   SPM 80    Minutes 15    METs 3.74      Prescription Details   Frequency (times per week) 3    Duration Progress to 30 minutes of continuous aerobic without signs/symptoms of physical distress      Intensity   THRR 40-80% of Max Heartrate 113-153    Ratings of Perceived Exertion 11-13    Perceived Dyspnea 0-4      Progression   Progression Continue to progress workloads to maintain intensity without signs/symptoms of physical distress.      Resistance Training   Training Prescription Yes  Weight 4 lb    Reps 10-15             Perform Capillary Blood Glucose checks as needed.  Exercise Prescription Changes:   Exercise Prescription Changes     Row Name 01/31/23 1600 02/11/23 0900 03/01/23 0700 03/04/23 1700 03/12/23 1400     Response to Exercise   Blood Pressure (Admit) 116/68 110/58 98/60 -- 122/80   Blood Pressure (Exercise) 122/64 150/70 142/82 -- 134/60   Blood Pressure (Exit) 110/64 94/60 96/60  -- 120/60   Heart Rate (Admit) 74 bpm 78 bpm 91 bpm -- 87 bpm   Heart Rate (Exercise) 93 bpm 110 bpm 125 bpm -- 120 bpm   Heart Rate (Exit) 75 bpm 67 bpm 89 bpm -- 79 bpm   Oxygen Saturation (Admit) 98 % -- -- -- --   Oxygen Saturation (Exercise) 99 % -- -- -- --   Rating  of Perceived Exertion (Exercise) 9 14 14  -- 13   Perceived Dyspnea (Exercise) 0 -- -- -- --   Symptoms None none none -- none   Comments Results -- -- -- --   Duration -- Progress to 30 minutes of  aerobic without signs/symptoms of physical distress Continue with 30 min of aerobic exercise without signs/symptoms of physical distress. Continue with 30 min of aerobic exercise without signs/symptoms of physical distress. Continue with 30 min of aerobic exercise without signs/symptoms of physical distress.   Intensity -- THRR unchanged THRR unchanged THRR unchanged THRR unchanged     Progression   Progression -- Continue to progress workloads to maintain intensity without signs/symptoms of physical distress. Continue to progress workloads to maintain intensity without signs/symptoms of physical distress. Continue to progress workloads to maintain intensity without signs/symptoms of physical distress. Continue to progress workloads to maintain intensity without signs/symptoms of physical distress.   Average METs -- 2.96 3.37 3.37 3.48     Resistance Training   Training Prescription -- Yes Yes Yes Yes   Weight -- 4 5 lb 5 lb 5 lb   Reps -- 10-15 10-15 10-15 10-15     Interval Training   Interval Training -- No No No No     Treadmill   MPH -- 2.4 2.6 2.6 2.4   Grade -- 4 5 5 7    Minutes -- 15 15 15 15    METs -- 4.16 4.78 4.78 5.15     NuStep   Level -- 2  T6 4  T6 nustep 4  T6 nustep 4  T6   Minutes -- 15 15 15 15    METs -- 2 2.5 2.5 2.4     Biostep-RELP   Level -- 2 3 3 4    Minutes -- 15 15 15 15    METs -- 3 2 2 3      Home Exercise Plan   Plans to continue exercise at -- -- -- [employer] (comment)  [facility] [employer] (comment)  [facility]   Frequency -- -- -- Add 2 additional days to program exercise sessions. Add 2 additional days to program exercise sessions.   Initial Home Exercises Provided -- -- -- 03/04/23 03/04/23     Oxygen   Maintain Oxygen  Saturation -- 88% or higher 88% or higher 88% or higher 88% or higher    Row Name 03/26/23 0700 04/10/23 1100 04/24/23 1500         Response to Exercise   Blood Pressure (Admit) 114/62 104/64 136/70     Blood Pressure (Exercise)  118/68 -- --     Blood Pressure (Exit) 106/60 96/58 104/60     Heart Rate (Admit) 77 bpm 87 bpm 87 bpm     Heart Rate (Exercise) 106 bpm 118 bpm 113 bpm     Heart Rate (Exit) 74 bpm 79 bpm 81 bpm     Rating of Perceived Exertion (Exercise) 13 15 13      Perceived Dyspnea (Exercise) 0 -- 0     Symptoms none none none     Duration Continue with 30 min of aerobic exercise without signs/symptoms of physical distress. Continue with 30 min of aerobic exercise without signs/symptoms of physical distress. Continue with 30 min of aerobic exercise without signs/symptoms of physical distress.     Intensity THRR unchanged THRR unchanged THRR unchanged       Progression   Progression Continue to progress workloads to maintain intensity without signs/symptoms of physical distress. Continue to progress workloads to maintain intensity without signs/symptoms of physical distress. Continue to progress workloads to maintain intensity without signs/symptoms of physical distress.     Average METs 3.82 3.8 3.45       Resistance Training   Training Prescription Yes Yes Yes     Weight 5 lb 5 lb 5 lb     Reps 10-15 10-15 10-15       Interval Training   Interval Training No No No       Treadmill   MPH 2.8 2.8 2.5     Grade 7 6 6.5     Minutes 15 15 15      METs 5.84 5.46 5.15       NuStep   Level 4  T6 4  T6 nustep 3  T6     Minutes 15 15 15      METs 2 2.7 2       Biostep-RELP   Level -- 3 2     Minutes -- 15 15     METs -- 3 2       Home Exercise Plan   Plans to continue exercise at [employer] (comment)  [facility] ([facility]  [facility] [employer]     Frequency Add 2 additional days to program exercise  sessions. Add 2 additional days to program exercise sessions. Add 2 additional days to program exercise sessions.     Initial Home Exercises Provided 03/04/23 03/04/23 03/04/23       Oxygen   Maintain Oxygen Saturation 88% or higher 88% or higher 88% or higher              Exercise Comments:   Exercise Comments     Row Name 02/04/23 1725           Exercise Comments First full day of exercise!  Patient was oriented to gym and equipment including functions, settings, policies, and procedures.  Patient's individual exercise prescription and treatment plan were reviewed.  All starting workloads were established based on the results of the 6 minute walk test done at initial orientation visit.  The plan for exercise progression was also introduced and progression will be customized based on patient's performance and goals.  .                Exercise Goals and Review:   Exercise Goals     Row Name 01/31/23 1608             Exercise Goals   Increase Physical Activity Yes  Intervention Develop an individualized exercise prescription for aerobic and resistive training based on initial evaluation findings, risk stratification, comorbidities and participant's personal goals.;Provide advice, education, support and counseling about physical activity/exercise needs.       Expected Outcomes Short Term: Attend rehab on a regular basis to increase amount of physical activity.;Long Term: Add in home exercise to make exercise part of routine and to increase amount of physical activity.;Long Term: Exercising regularly at least 3-5 days a week.       Increase Strength and Stamina Yes       Intervention Provide advice, education, support and counseling about physical activity/exercise needs.;Develop an individualized exercise prescription for aerobic and resistive training based on initial evaluation findings, risk stratification, comorbidities and participant's personal goals.        Expected Outcomes Short Term: Perform resistance training exercises routinely during rehab and add in resistance training at home;Short Term: Increase workloads from initial exercise prescription for resistance, speed, and METs.;Long Term: Improve cardiorespiratory fitness, muscular endurance and strength as measured by increased METs and functional capacity ( )       Able to understand and use rate of perceived exertion (RPE) scale Yes       Intervention Provide education and explanation on how to use RPE scale       Expected Outcomes Short Term: Able to use RPE daily in rehab to express subjective intensity level;Long Term:  Able to use RPE to guide intensity level when exercising independently       Able to understand and use Dyspnea scale Yes       Intervention Provide education and explanation on how to use Dyspnea scale       Expected Outcomes Short Term: Able to use Dyspnea scale daily in rehab to express subjective sense of shortness of breath during exertion       Knowledge and understanding of Target Heart Rate Range (THRR) Yes       Intervention Provide education and explanation of THRR including how the numbers were predicted and where they are located for reference       Expected Outcomes Short Term: Able to state/look up THRR;Long Term: Able to use THRR to govern intensity when exercising independently;Short Term: Able to use daily as guideline for intensity in rehab       Able to check pulse independently Yes       Intervention Provide education and demonstration on how to check pulse in carotid and radial arteries.;Review the importance of being able to check your own pulse for safety during independent exercise       Expected Outcomes Short Term: Able to explain why pulse checking is important during independent exercise;Long Term: Able to check pulse independently and accurately       Understanding of Exercise Prescription Yes       Intervention Provide education, explanation, and  written materials on patient's individual exercise prescription       Expected Outcomes Short Term: Able to explain program exercise prescription;Long Term: Able to explain home exercise prescription to exercise independently                Exercise Goals Re-Evaluation :  Exercise Goals Re-Evaluation     Row Name 02/04/23 1726 02/11/23 0946 03/01/23 0727 03/04/23 1751 03/12/23 1446     Exercise Goal Re-Evaluation   Exercise Goals Review Increase Physical Activity;Able to understand and use rate of perceived exertion (RPE) scale;Knowledge and understanding of Target Heart Rate Range (THRR);Understanding of Exercise Prescription;Increase  Strength and Stamina;Able to check pulse independently Increase Physical Activity;Increase Strength and Stamina;Understanding of Exercise Prescription Increase Physical Activity;Increase Strength and Stamina;Understanding of Exercise Prescription Increase Physical Activity;Able to understand and use rate of perceived exertion (RPE) scale;Able to understand and use Dyspnea scale;Knowledge and understanding of Target Heart Rate Range (THRR);Able to check pulse independently;Increase Strength and Stamina;Understanding of Exercise Prescription Increase Physical Activity;Increase Strength and Stamina;Understanding of Exercise Prescription   Comments Reviewed RPE  and dyspnea scale, THR and program prescription with pt today.  Pt voiced understanding and was given a copy of goals to take home. Lauren [name] is off to a good start in the program. She recently attended her first session and continues to become familiar with the program and her exercise prescription. She has increased her treadmill grade from 0% to 4% and has added the Biostep to her current prescription. We will continue to monitor her progress in the program. Lauren [name] is doing well in the program. She recently increased her treadmill workload to a speed of 2.6 mph with a 5% incline. She also improved to level 4 on the  T6 nustep and level 3 on the biostep. She increased from 4 lb to 5 lb hand weights for resistance training as well. We will continue to monitor her progress in the program. Reviewed home exercise with pt today.  Pt plans to walk and go to [facility] [employer] for exercise.  Reviewed THR, pulse, RPE, sign and symptoms, pulse oximetery and when to call 911 or MD.  Also discussed weather considerations and indoor options.  Pt voiced understanding. Lauren [name] continues to do well in rehab. She increased her treadmill workload to a speed of 2.4 mph with an incline of 7%. She also continues to work at level 4 on the T6 nustep and improved to level 6 on the biostep. We will continue to monitor her progress in the program.   Expected Outcomes Short: Use RPE daily to regulate intensity.  Long: Follow program prescription in THR. Short: Continue to follow current exercise prescription, and progressively increase workloads. Long: Continue exercise to improve strength and stamina. Short: Continue to progressively increase workloads. Long: Continue exercise to improve strength and stamina. Short: add 1-2 days per week of exercise at home on off days of rehab. Long: become independent with exercise routine. Short: Try level 5 on the T6 nustep. Long: Continue to increase overall METs and stamina.    Row Name 03/26/23 0758 04/10/23 1123 04/24/23 1505 04/29/23 1731       Exercise Goal Re-Evaluation   Exercise Goals Review Increase Physical Activity;Increase Strength and Stamina;Understanding of Exercise Prescription Increase Physical Activity;Increase Strength and Stamina;Understanding of Exercise Prescription Increase Physical Activity;Increase Strength and Stamina;Understanding of Exercise Prescription Increase Physical Activity;Increase Strength and Stamina;Understanding of Exercise Prescription    Comments Lauren [name] continues to do well in rehab. She has been able to increase her speed on the treadmill from 2.4 to 2.7mph. She also  has been able to maintain intensity of level 4 the T6 nustep. We will continue to monitor her progress in the program. Lauren [name] is doing well in rehab. She has stayed consistent on the treadmill at a speed of 2.8 mph and an incline of 6%. She also continues to work at level 3 on the biostep and level 4 on the T6 nustep. We will continue to monitor her progress in the program. Lauren [name] continues to do well in rehab. She has been able to gradually increase her treadmill workloads, during this review she was able  to top out at 2.5 mph and 6.5% incline. She has also maintained a level of 3 on the T6 nustep. We will continue to monitor her progress in the program. Lauren [name] states she is going to go to [employer] when she is done with [facility]. She has been doing well in the program and has been increasing her levels. She is not to far from [employer] and weill try to go four days a week.    Expected Outcomes Short: Try level 5 on the T6 nustep. Long: Continue to increase overall METs and stamina. Short: Try level 5 on the T6 nustep. Long: Continue to increase overall METs and stamina. Short: Continue to increase treadmill workload, increase level on the T6 nustep and biostep. Long: Continue exercise to improve strength and stamina. Short: graduate [facility]. Long: workout routinely independently.             Discharge Exercise Prescription (Final Exercise Prescription Changes):  Exercise Prescription Changes - 04/24/23 1500       Response to Exercise   Blood Pressure (Admit) 136/70    Blood Pressure (Exit) 104/60    Heart Rate (Admit) 87 bpm    Heart Rate (Exercise) 113 bpm    Heart Rate (Exit) 81 bpm    Rating of Perceived Exertion (Exercise) 13    Perceived Dyspnea (Exercise) 0    Symptoms none    Duration Continue with 30 min of aerobic exercise without signs/symptoms of physical distress.    Intensity THRR unchanged      Progression   Progression Continue to progress workloads to  maintain intensity without signs/symptoms of physical distress.    Average METs 3.45      Resistance Training   Training Prescription Yes    Weight 5 lb    Reps 10-15      Interval Training   Interval Training No      Treadmill   MPH 2.5    Grade 6.5    Minutes 15    METs 5.15      NuStep   Level 3   T6   Minutes 15    METs 2      Biostep-RELP   Level 2    Minutes 15    METs 2      Home Exercise Plan   Plans to continue exercise at [employer] (comment)   [facility] [employer]   Frequency Add 2 additional days to program exercise sessions.    Initial Home Exercises Provided 03/04/23      Oxygen   Maintain Oxygen Saturation 88% or higher             Nutrition:  Target Goals: Understanding of nutrition guidelines, daily intake of sodium 1500mg , cholesterol 200mg , calories 30% from fat and 7% or less from saturated fats, daily to have 5 or more servings of fruits and vegetables.  Education: All About Nutrition: -Group instruction provided by verbal, written material, interactive activities, discussions, models, and posters to present general guidelines for heart healthy nutrition including fat, fiber, MyPlate, the role of sodium in heart healthy nutrition, utilization of the nutrition label, and utilization of this knowledge for meal planning. Follow up email sent as well. Written material given at graduation. Flowsheet Row Cardiac Rehab from 02/20/2023 in [facility] Cardiac and Pulmonary Rehab  Education need identified 01/31/23  Date 02/20/23  Educator JG  Instruction Review Code 1- Verbalizes Understanding       Biometrics:  Pre Biometrics - 01/31/23 1609  Pre Biometrics   Height 5' 4.5" (1.638 m)    Weight 162 lb 4.8 oz (73.6 kg)    Waist Circumference 37 inches    Hip Circumference 38 inches    Waist to Hip Ratio 0.97 %    BMI (Calculated) 27.44    Single Leg Stand 25 seconds              Nutrition Therapy Plan and Nutrition  Goals:   Nutrition Assessments:  MEDIFICTS Score Key: >=70 Need to make dietary changes  40-70 Heart Healthy Diet <= 40 Therapeutic Level Cholesterol Diet  Flowsheet Row Cardiac Rehab from 01/31/2023 in [facility] Cardiac and Pulmonary Rehab  Picture Your Plate Total Score on Admission 38      Picture Your Plate Scores: <78 Unhealthy dietary pattern with much room for improvement. 41-50 Dietary pattern unlikely to meet recommendations for good health and room for improvement. 51-60 More healthful dietary pattern, with some room for improvement.  >60 Healthy dietary pattern, although there may be some specific behaviors that could be improved.    Nutrition Goals Re-Evaluation:  Nutrition Goals Re-Evaluation     Row Name 03/04/23 1755 04/29/23 1729           Goals   Nutrition Goal reschedule missed appointment with RD reschedule missed appointment with RD      Comment reschedule missed appointment with RD Patient would still like to meet with the dietitian.      Expected Outcome Short: meet with RD. Long: establish and follow goals set by RD. Short: meet with RD. Long: establish and follow goals set by RD.               Nutrition Goals Discharge (Final Nutrition Goals Re-Evaluation):  Nutrition Goals Re-Evaluation - 04/29/23 1729       Goals   Nutrition Goal reschedule missed appointment with RD    Comment Patient would still like to meet with the dietitian.    Expected Outcome Short: meet with RD. Long: establish and follow goals set by RD.             Psychosocial: Target Goals: Acknowledge presence or absence of significant depression and/or stress, maximize coping skills, provide positive support system. Participant is able to verbalize types and ability to use techniques and skills needed for reducing stress and depression.   Education: Stress, Anxiety, and Depression - Group verbal and visual presentation to define topics covered.  Reviews how body is impacted  by stress, anxiety, and depression.  Also discusses healthy ways to reduce stress and to treat/manage anxiety and depression.  Written material given at graduation.   Education: Sleep Hygiene -Provides group verbal and written instruction about how sleep can affect your health.  Define sleep hygiene, discuss sleep cycles and impact of sleep habits. Review good sleep hygiene tips.    Initial Review & Psychosocial Screening:  Initial Psych Review & Screening - 01/07/23 1027       Initial Review   Current issues with None Identified      Family Dynamics   Good Support System? Yes   daughter, son, mama     Barriers   Psychosocial barriers to participate in program There are no identifiable barriers or psychosocial needs.      Screening Interventions   Interventions Encouraged to exercise;To provide support and resources with identified psychosocial needs;Provide feedback about the scores to participant    Expected Outcomes Short Term goal: Utilizing psychosocial counselor, staff and physician to assist with  identification of specific Stressors or current issues interfering with healing process. Setting desired goal for each stressor or current issue identified.;Long Term Goal: Stressors or current issues are controlled or eliminated.;Short Term goal: Identification and review with participant of any Quality of Life or Depression concerns found by scoring the questionnaire.;Long Term goal: The participant improves quality of Life and PHQ9 Scores as seen by post scores and/or verbalization of changes             Quality of Life Scores:   Quality of Life - 01/31/23 1556       Quality of Life   Select Quality of Life      Quality of Life Scores   Health/Function Pre 23.2 %    Socioeconomic Pre 23.63 %    Psych/Spiritual Pre 24.79 %    Family Pre 24 %    GLOBAL Pre 23.73 %            Scores of 19 and below usually indicate a poorer quality of life in these areas.  A difference  of  2-3 points is a clinically meaningful difference.  A difference of 2-3 points in the total score of the Quality of Life Index has been associated with significant improvement in overall quality of life, self-image, physical symptoms, and general health in studies assessing change in quality of life.  PHQ-9: Review Flowsheet  More data exists      03/18/2023 01/31/2023 01/11/2023 12/14/2022 11/06/2022  Depression screen PHQ 2/9  Decreased Interest 0 1 1 1 1   Down, Depressed, Hopeless 0 0 0 1 1  PHQ - 2 Score 0 1 1 2 2   Altered sleeping 0 1 1 0 0  Tired, decreased energy 0 1 1 0 0  Change in appetite 0 1 0 0 0  Feeling bad or failure about yourself  0 0 1 1 0  Trouble concentrating 0 0 0 0 0  Moving slowly or fidgety/restless 0 0 0 0 0  Suicidal thoughts 0 0 0 0 0  PHQ-9 Score 0 4 4 3 2   Difficult doing work/chores Not difficult at all Not difficult at all Not difficult at all Somewhat difficult Somewhat difficult    Details           Interpretation of Total Score  Total Score Depression Severity:  1-4 = Minimal depression, 5-9 = Mild depression, 10-14 = Moderate depression, 15-19 = Moderately severe depression, 20-27 = Severe depression   Psychosocial Evaluation and Intervention:  Psychosocial Evaluation - 01/07/23 1033       Psychosocial Evaluation & Interventions   Interventions Encouraged to exercise with the program and follow exercise prescription    Comments Latisha has no barriers to attending the program.  She has a good support system with her daughter, son and her Mother.  She hopes to learn her heart rate limits and how to manage her heart disease so she can return to hiking and visiting waterfalls.    Expected Outcomes STG Lauren [name] is able to attend all scheduled sessions and is able to see progression with her exercise. Her goal is to have stamina to get back to hiking and visiting waterfalls. LTG Lauren [name] has returned to her hikes and visiting warefalls without any  cardiac concerns.    Continue Psychosocial Services  Follow up required by staff             Psychosocial Re-Evaluation:  Psychosocial Re-Evaluation     Row Name 03/04/23 1757 04/29/23 1727  Psychosocial Re-Evaluation   Current issues with None Identified Current Anxiety/Panic;Current Depression      Comments Lauren [name] has no new sleep, stress, or mental health concerns. 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 The stress of the surgery and her underlying anxiety led her to need medication. She ahs been dealing with her Uncle who had prostate cancer and was out last week due to driving her mother around.      Expected Outcomes Short: continue to attend cardiac rehab consistently for mental health benefits. Long: maintain good mental health routine. Short: continue medication as needed. Long: maintain a positive outlook on her mental health.      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 The stress of the surgery and her underlying anxiety led her to need medication. She ahs been dealing with her Uncle who had prostate cancer and was out last week due to driving her mother around.    Expected Outcomes Short: continue medication as needed. Long: maintain a positive outlook on her mental health.    Interventions Encouraged to attend Cardiac Rehabilitation for the exercise    Continue Psychosocial Services  Follow up required by staff              Vocational Rehabilitation: Provide vocational rehab assistance to qualifying candidates.   Vocational Rehab Evaluation & Intervention:  Vocational Rehab - 03/04/23 1756       Initial Vocational Rehab Evaluation & Intervention   Assessment shows need for Vocational Rehabilitation No      Vocational Rehab Re-Evaulation   Comments currently back at work             Education: Education Goals: Education classes will be provided on a variety of topics geared toward better understanding of heart health and risk factor modification. Participant will state understanding/return demonstration of topics presented as noted by education test scores.  Learning Barriers/Preferences:  Learning Barriers/Preferences - 01/07/23 1029       Learning Barriers/Preferences   Learning Barriers None    Learning Preferences None             General Cardiac Education Topics:  AED/CPR: - Group verbal and written instruction with the use of models to demonstrate the basic use of the AED with the basic ABC's of resuscitation.   Anatomy and Cardiac Procedures: - Group verbal and visual presentation and models provide information about basic cardiac anatomy and function. Reviews the testing methods done to diagnose heart disease and the outcomes of the test results. Describes the treatment choices: Medical Management, Angioplasty, or Coronary Bypass Surgery for treating various heart conditions including Myocardial Infarction, Angina, Valve Disease, and Cardiac Arrhythmias.  Written material given at graduation. Flowsheet Row Cardiac Rehab from 02/20/2023 in [facility] Cardiac and Pulmonary Rehab  Education need identified 01/31/23       Medication Safety: - Group verbal and visual instruction to review commonly prescribed medications for heart and lung disease. Reviews the medication, class of  the drug, and side effects. Includes the steps to properly store meds and maintain the prescription  regimen.  Written material given at graduation.   Intimacy: - Group verbal instruction through game format to discuss how heart and lung disease can affect sexual intimacy. Written material given at graduation..   Know Your Numbers and Heart Failure: - Group verbal and visual instruction to discuss disease risk factors for cardiac and pulmonary disease and treatment options.  Reviews associated critical values for Overweight/Obesity, Hypertension, Cholesterol, and Diabetes.  Discusses basics of heart failure: signs/symptoms and treatments.  Introduces Heart Failure Zone chart for action plan for heart failure.  Written material given at graduation.   Infection Prevention: - Provides verbal and written material to individual with discussion of infection control including proper hand washing and proper equipment cleaning during exercise session. Flowsheet Row Cardiac Rehab from 02/20/2023 in [facility] Cardiac and Pulmonary Rehab  Date 01/31/23  Educator NT  Instruction Review Code 1- Verbalizes Understanding       Falls Prevention: - Provides verbal and written material to individual with discussion of falls prevention and safety. Flowsheet Row Cardiac Rehab from 02/20/2023 in [facility] Cardiac and Pulmonary Rehab  Date 01/07/23  Educator SB  Instruction Review Code 1- Verbalizes Understanding       Other: -Provides group and verbal instruction on various topics (see comments)   Knowledge Questionnaire Score:  Knowledge Questionnaire Score - 01/31/23 1556       Knowledge Questionnaire Score   Pre Score 20/26             Core Components/Risk Factors/Patient Goals at Admission:  Personal Goals and Risk Factors at Admission - 01/07/23 1131       Core Components/Risk Factors/Patient Goals on Admission   Number of packs per day Smoking cessation instruction/counseling given:  counseled patient on the dangers of tobacco use, advised patient to stop smoking, and reviewed strategies to  maximize success. Lauren [name] is a current tobacco user. Intervention for tobacco cessation was provided at the initial medical review. She was asked about readiness to quit and reported that she is weaning slowly to a Quit date of 06/12/2023 . Patient was advised and educated about tobacco cessation using combination therapy, tobacco cessation classes, quit line, and quit smoking apps. Patient demonstrated understanding of this material. Staff will continue to provide encouragement and follow up with the patient throughout the program.             Education:Diabetes - Individual verbal and written instruction to review signs/symptoms of diabetes, desired ranges of glucose level fasting, after meals and with exercise. Acknowledge that pre and post exercise glucose checks will be done for 3 sessions at entry of program. Flowsheet Row Cardiac Rehab from 02/20/2023 in [facility] Cardiac and Pulmonary Rehab  Date 01/31/23  Educator NT  Instruction Review Code 1- Verbalizes Understanding       Core Components/Risk Factors/Patient Goals Review:   Goals and Risk Factor Review     Row Name 03/04/23 1758 04/29/23 1724           Core Components/Risk Factors/Patient Goals Review   Personal Goals Review Diabetes;Tobacco Cessation Tobacco Cessation      Review Patient reported that she continues to use nicotine patch and it is helpful. She has cut back to about 6 cigarettes a day. She also monitors her blood sugars at home and states they are under control. Her las A1C is reported to be around 6.9. Lauren [name] is doing well with her smoking  habit and is willing to quit. She is still getting up in the morning and craving after she eats. Lauren [name] is not taking any medications currently to help her quit smoking. Informed her of certain techniques to use to help her quit.      Expected Outcomes Short: continue to monitor blood sugars at home and maintain A1C below 7%. Continue to cut back on amount of cigarettes.. Long:  quit smoking. Short: continue to reduce tobacco use. Long: be tobacco free.               Core Components/Risk Factors/Patient Goals at Discharge (Final Review):   Goals and Risk Factor Review - 04/29/23 1724       Core Components/Risk Factors/Patient Goals Review   Personal Goals Review Tobacco Cessation    Review Lauren [name] is doing well with her smoking habit and is willing to quit. She is still getting up in the morning and craving after she eats. Lauren [name] is not taking any medications currently to help her quit smoking. Informed her of certain techniques to use to help her quit.    Expected Outcomes Short: continue to reduce tobacco use. Long: be tobacco free.             ITP Comments:  ITP Comments     Row Name 01/07/23 1131 01/31/23 1553 02/04/23 1725 02/20/23 1056 03/20/23 1222   ITP Comments irtual orientation call completed today. shehas an appointment on Date: 01/31/2023  for EP eval and gym Orientation.  Documentation of diagnosis can be found in [facility] 10/19/2022 .   Smoking cessation instruction/counseling given:  counseled patient on the dangers of tobacco use, advised patient to stop smoking, and reviewed strategies to maximize success. Lauren [name] is a current tobacco user. Intervention for tobacco cessation was provided at the initial medical review. She was asked about readiness to quit and reported that she is weaning slowly to a Quit date of 06/12/2023 . Patient was advised and educated about tobacco cessation using combination therapy, tobacco cessation classes, quit line, and quit smoking apps. Patient demonstrated understanding of this material. Staff will continue to provide encouragement and follow up with the patient throughout the program. Completed and gym orientation. Initial ITP created and sent for review to Dr. Bethann Punches, Medical Director. First full day of exercise!  Patient was oriented to gym and equipment including functions, settings, policies, and  procedures.  Patient's individual exercise prescription and treatment plan were reviewed.  All starting workloads were established based on the results of the 6 minute walk test done at initial orientation visit.  The plan for exercise progression was also introduced and progression will be customized based on patient's performance and goals.  . 30 Day review completed. Medical Director ITP review done, changes made as directed, and signed approval by Medical Director.     new to program 30 Day review completed. Medical Director ITP review done, changes made as directed, and signed approval by Medical Director.    Row Name 04/17/23 1311 05/08/23 1011         ITP Comments 30 Day review completed. Medical Director ITP review done, changes made as directed, and signed approval by Medical Director. 30 Day review completed. Medical Director ITP review done, changes made as directed, and signed approval by Medical Director.               Comments:

## 2023-05-19 DIAGNOSIS — Z419 Encounter for procedure for purposes other than remedying health state, unspecified: Secondary | ICD-10-CM | POA: Diagnosis not present

## 2023-05-20 ENCOUNTER — Telehealth: Payer: Self-pay | Admitting: *Deleted

## 2023-05-20 ENCOUNTER — Encounter: Payer: Self-pay | Admitting: *Deleted

## 2023-05-20 NOTE — Telephone Encounter (Signed)
Called to check on Lauren [name]. She has not been to rehab since 11/13. She had been out of town and states she plans to return this Wednesday 12/4. Appt desk updated.

## 2023-05-22 ENCOUNTER — Encounter: Payer: 59 | Attending: Cardiology

## 2023-05-22 DIAGNOSIS — Z951 Presence of aortocoronary bypass graft: Secondary | ICD-10-CM | POA: Insufficient documentation

## 2023-05-27 ENCOUNTER — Ambulatory Visit: Payer: 59

## 2023-05-29 ENCOUNTER — Encounter: Payer: 59 | Admitting: *Deleted

## 2023-05-29 DIAGNOSIS — Z951 Presence of aortocoronary bypass graft: Secondary | ICD-10-CM

## 2023-05-29 NOTE — Progress Notes (Signed)
Daily Session Note  Patient Details  Name: Lauren [name] MRN: [id number] Date of Birth: 1975/08/10 Referring Provider:   Flowsheet Row Cardiac Rehab from 01/31/2023 in [facility] Cardiac and Pulmonary Rehab  Referring Provider Dr. [name]       Encounter Date: 05/29/2023  Check In:  Session Check In - 05/29/23 1716       Check-In   Supervising physician immediately available to respond to emergencies See telemetry face sheet for immediately available ER MD    Location [facility]-Cardiac & Pulmonary Rehab    Staff Present Susann Givens, RN Mabeline Caras, BS, ACSM CEP, Exercise Physiologist;Joseph Reino [place], [place]    Virtual Visit No    Medication changes reported     No    Fall or balance concerns reported    No    Tobacco Cessation No Change    Warm-up and Cool-down Performed on first and last piece of equipment    Resistance Training Performed Yes    VAD Patient? No    PAD/SET Patient? No      Pain Assessment   Currently in Pain? No/denies                Social History   Tobacco Use  Smoking Status Some Days   Current packs/day: 0.50   Average packs/day: 0.7 packs/day for 62.9 years (45.5 ttl pk-yrs)   Types: Cigarettes   Start date: 1990  Smokeless Tobacco Never  Tobacco Comments   Working down number of cigarettes a day  Hope to have cessation by Christmas 06/12/2023. 8-9 cigarettes a day.     Goals Met:  Independence with exercise equipment Exercise tolerated well No report of concerns or symptoms today Strength training completed today  Goals Unmet:  Not Applicable  Comments: Pt able to follow exercise prescription today without complaint.  Will continue to monitor for progression.    Dr. Bethann Punches is Medical Director for [facility] Cardiac Rehabilitation.  Dr. Vida Rigger is Medical Director for [facility] Pulmonary Rehabilitation.

## 2023-05-30 DIAGNOSIS — Z951 Presence of aortocoronary bypass graft: Secondary | ICD-10-CM | POA: Diagnosis not present

## 2023-05-30 NOTE — Progress Notes (Signed)
Daily Session Note  Patient Details  Name: Lauren [name] MRN: [id number] Date of Birth: 1975/12/21 Referring Provider:   Flowsheet Row Cardiac Rehab from 01/31/2023 in [facility] Cardiac and Pulmonary Rehab  Referring Provider Dr. [name]       Encounter Date: 05/30/2023  Check In:  Session Check In - 05/30/23 1716       Check-In   Supervising physician immediately available to respond to emergencies See telemetry face sheet for immediately available ER MD    Location [facility]-Cardiac & Pulmonary Rehab    Staff Present Susann Givens, RN BSN;Joseph Hood, RCP,RRT,BSRT;Margaret Best, MS, Exercise Physiologist    Virtual Visit No    Medication changes reported     No    Fall or balance concerns reported    No    Tobacco Cessation No Change    Current number of cigarettes/nicotine per day     6    Warm-up and Cool-down Performed on first and last piece of equipment    Resistance Training Performed Yes    VAD Patient? No    PAD/SET Patient? No      Pain Assessment   Currently in Pain? No/denies                Social History   Tobacco Use  Smoking Status Some Days   Current packs/day: 0.50   Average packs/day: 0.7 packs/day for 62.9 years (45.5 ttl pk-yrs)   Types: Cigarettes   Start date: 1990  Smokeless Tobacco Never  Tobacco Comments   Working down number of cigarettes a day  Hope to have cessation by Christmas 06/12/2023. 8-9 cigarettes a day.     Goals Met:  Independence with exercise equipment Exercise tolerated well No report of concerns or symptoms today Strength training completed today  Goals Unmet:  Not Applicable  Comments: Pt able to follow exercise prescription today without complaint.  Will continue to monitor for progression.    Dr. Bethann Punches is Medical Director for [facility] Cardiac Rehabilitation.  Dr. Vida Rigger is Medical Director for [facility] Pulmonary Rehabilitation.

## 2023-06-05 ENCOUNTER — Ambulatory Visit: Payer: 59

## 2023-06-05 ENCOUNTER — Encounter: Payer: Self-pay | Admitting: *Deleted

## 2023-06-05 DIAGNOSIS — Z951 Presence of aortocoronary bypass graft: Secondary | ICD-10-CM

## 2023-06-05 NOTE — Progress Notes (Signed)
Cardiac Individual Treatment Plan  Patient Details  Name: Lauren [name] MRN: [id number] Date of Birth: 1975/11/27 Referring Provider:   Flowsheet Row Cardiac Rehab from 01/31/2023 in [facility] Cardiac and Pulmonary Rehab  Referring Provider Dr. [name]       Initial Encounter Date:  Flowsheet Row Cardiac Rehab from 01/31/2023 in [facility] Cardiac and Pulmonary Rehab  Date 01/31/23       Visit Diagnosis: S/P CABG x 3  Patient's Home Medications on Admission:  Current Outpatient Medications:    acetaminophen (TYLENOL) 500 MG tablet, Take 1 tablet (500 mg total) by mouth every 6 (six) hours as needed., Disp: 30 tablet, Rfl: 0   aspirin EC 81 MG tablet, Take 1 tablet (81 mg total) by mouth daily. Swallow whole., Disp: 90 tablet, Rfl: 3   b complex vitamins capsule, Take 1 capsule by mouth daily., Disp: , Rfl:    carvedilol (COREG) 6.25 MG tablet, Take 1 tablet (6.25 mg total) by mouth 2 (two) times daily., Disp: 180 tablet, Rfl: 0   cetirizine (ZYRTEC) 10 MG tablet, Take 10 mg by mouth daily as needed for allergies., Disp: , Rfl:    dapagliflozin propanediol (FARXIGA) 10 MG TABS tablet, Take 1 tablet (10 mg total) by mouth daily., Disp: 90 tablet, Rfl: 1   DULoxetine (CYMBALTA) 60 MG capsule, Take 1 capsule (60 mg total) by mouth 2 (two) times daily., Disp: 180 capsule, Rfl: 1   fluticasone (FLONASE) 50 MCG/ACT nasal spray, Place 1 spray into both nostrils daily as needed for allergies., Disp: , Rfl:    lisinopril (ZESTRIL) 2.5 MG tablet, Take 1 tablet (2.5 mg total) by mouth daily., Disp: 90 tablet, Rfl: 3   nicotine (NICODERM CQ) 7 mg/24hr patch, Place 1 patch (7 mg total) onto the skin daily., Disp: 21 patch, Rfl: 0   nitroGLYCERIN (NITROSTAT) 0.3 MG SL tablet, Place 1 tablet (0.3 mg total) under the tongue every 5 (five) minutes as needed for chest pain., Disp: 90 tablet, Rfl: 12   rosuvastatin (CRESTOR) 40 MG tablet, Take 1 tablet (40 mg total) by mouth daily., Disp: 90 tablet, Rfl: 1    Semaglutide (RYBELSUS) 7 MG TABS, Take 1 tablet (7 mg total) by mouth daily., Disp: 90 tablet, Rfl: 1   traZODone (DESYREL) 100 MG tablet, Take 1 tablet (100 mg total) by mouth at bedtime., Disp: 90 tablet, Rfl: 1   valACYclovir (VALTREX) 1000 MG tablet, TAKE 1 TABLET (1,000 MG TOTAL) BY MOUTH DAILY. TAKE AS NEEDED FOR HERPES OUTBREAK., Disp: 30 tablet, Rfl: 11  Past Medical History: Past Medical History:  Diagnosis Date   Allergic rhinitis    Anxiety    Chronic depression    Coronary artery disease involving native coronary artery without angina pectoris 10/19/2022   Cardiac cath 10/19/2022 with distal left main and ostial LAD/LCx disease as well as 95% RCA disease. => Referred for CABG x 3 LIMA-LAD, SVG-PDA, SVG-OM (10/22/2022)     Elevated LFTs    Essential hypertension 05/19/2018   Genital herpes    GERD (gastroesophageal reflux disease)    Hyperlipidemia    S/P CABG x 3: LIMA-LAD, SVG-PDA, SVG-OM 10/22/2022   Smoker 05/26/2014   Type 2 diabetes mellitus with hyperglycemia, without long-term current use of insulin (HCC) 08/18/2021   Vitamin D deficiency     Tobacco Use: Social History   Tobacco Use  Smoking Status Some Days   Current packs/day: 0.50   Average packs/day: 0.7 packs/day for 63.0 years (45.5 ttl pk-yrs)   Types:  Cigarettes   Start date: 1990  Smokeless Tobacco Never  Tobacco Comments   Working down number of cigarettes a day  Hope to have cessation by Christmas 06/12/2023. 8-9 cigarettes a day.     Labs: Review Flowsheet  More data exists      Latest Ref Rng & Units 10/21/2022 10/22/2022 10/23/2022 12/14/2022 03/18/2023  Labs for ITP Cardiac and Pulmonary Rehab  Cholestrol <200 mg/dL - - - 628  -  LDL (calc) mg/dL (calc) - - - 46  -  HDL-C > OR = 50 mg/dL - - - 40  -  Trlycerides <150 mg/dL - - - 315  -  Hemoglobin A1c <5.7 % of total Hgb 8.5  - - 6.8  6.0   PH, Arterial 7.35 - 7.45 7.44  7.322  7.340  7.271  7.328  7.301  7.396  7.302  7.387  - -  PCO2 arterial  32 - 48 mmHg 37  34.0  36.7  35.5  43.4  44.2  38.2  48.1  36.4  - -  Bicarbonate 20.0 - 28.0 mmol/L 25.1  17.5  19.7  16.4  22.9  21.8  24.6  23.4  23.7  21.8  - -  TCO2 22 - 32 mmol/L - 18  21  17  24  25  23  25  26  25  26  26  25  23   - -  Acid-base deficit 0.0 - 2.0 mmol/L - 8.0  5.0  10.0  3.0  4.0  1.0  1.0  3.0  3.0  - -  O2 Saturation % 99.5  95  97  98  95  100  76  100  100  96  - -    Details       Multiple values from one day are sorted in reverse-chronological order          Exercise Target Goals: Exercise Program Goal: Individual exercise prescription set using results from initial 6 min walk test and THRR while considering  patient's activity barriers and safety.   Exercise Prescription Goal: Initial exercise prescription builds to 30-45 minutes a day of aerobic activity, 2-3 days per week.  Home exercise guidelines will be given to patient during program as part of exercise prescription that the participant will acknowledge.   Education: Aerobic Exercise: - Group verbal and visual presentation on the components of exercise prescription. Introduces F.I.T.T principle from ACSM for exercise prescriptions.  Reviews F.I.T.T. principles of aerobic exercise including progression. Written material given at graduation. Flowsheet Row Cardiac Rehab from 02/20/2023 in [facility] Cardiac and Pulmonary Rehab  Education need identified 01/31/23       Education: Resistance Exercise: - Group verbal and visual presentation on the components of exercise prescription. Introduces F.I.T.T principle from ACSM for exercise prescriptions  Reviews F.I.T.T. principles of resistance exercise including progression. Written material given at graduation. Flowsheet Row Cardiac Rehab from 02/20/2023 in [facility] Cardiac and Pulmonary Rehab  Date 02/06/23  Educator [facility]  Instruction Review Code 1- [employer] Understanding        Education: Exercise & Equipment Safety: - Individual verbal instruction and  demonstration of equipment use and safety with use of the equipment. Flowsheet Row Cardiac Rehab from 02/20/2023 in [facility] Cardiac and Pulmonary Rehab  Date 01/31/23  Educator NT  Instruction Review Code 1- Verbalizes Understanding       Education: Exercise Physiology & General Exercise Guidelines: - Group verbal and written instruction with models to review the  exercise physiology of the cardiovascular system and associated critical values. Provides general exercise guidelines with specific guidelines to those with heart or lung disease.    Education: Flexibility, Balance, Mind/Body Relaxation: - Group verbal and visual presentation with interactive activity on the components of exercise prescription. Introduces F.I.T.T principle from ACSM for exercise prescriptions. Reviews F.I.T.T. principles of flexibility and balance exercise training including progression. Also discusses the mind body connection.  Reviews various relaxation techniques to help reduce and manage stress (i.e. Deep breathing, progressive muscle relaxation, and visualization). Balance handout provided to take home. Written material given at graduation. Flowsheet Row Cardiac Rehab from 02/20/2023 in [facility] Cardiac and Pulmonary Rehab  Date 02/06/23  Educator [facility]  Instruction Review Code 1- Verbalizes Understanding       Activity Barriers & Risk Stratification:  Activity Barriers & Cardiac Risk Stratification - 01/31/23 1608       Activity Barriers & Cardiac Risk Stratification   Activity Barriers Joint Problems;Other (comment)    Comments R hip pain    Cardiac Risk Stratification High             6 Minute Walk:  6 Minute Walk     Row Name 01/31/23 1607         6 Minute Walk   Phase Initial     Distance 1165 feet     Walk Time 6 minutes     # of Rest Breaks 0     MPH 2.21     METS 3.74     RPE 9     Perceived Dyspnea  0     VO2 Peak 13.11     Symptoms No     Resting HR 74 bpm     Resting BP 116/68      Resting Oxygen Saturation  98 %     Exercise Oxygen Saturation  during 6 min walk 99 %     Max Ex. HR 93 bpm     Max Ex. BP 122/64     2 Minute Post BP 110/64              Oxygen Initial Assessment:   Oxygen Re-Evaluation:   Oxygen Discharge (Final Oxygen Re-Evaluation):   Initial Exercise Prescription:  Initial Exercise Prescription - 01/31/23 1600       Date of Initial Exercise RX and Referring Provider   Date 01/31/23    Referring Provider Dr. [name]      Oxygen   Maintain Oxygen Saturation 88% or higher      Treadmill   MPH 2.2    Grade 0    Minutes 15    METs 2.68      NuStep   Level 2    SPM 80    Minutes 15    METs 3.74      T5 Nustep   Level 2   T6 nustep   SPM 80    Minutes 15    METs 3.74      Prescription Details   Frequency (times per week) 3    Duration Progress to 30 minutes of continuous aerobic without signs/symptoms of physical distress      Intensity   THRR 40-80% of Max Heartrate 113-153    Ratings of Perceived Exertion 11-13    Perceived Dyspnea 0-4      Progression   Progression Continue to progress workloads to maintain intensity without signs/symptoms of physical distress.      Resistance Training   Training Prescription Yes  Weight 4 lb    Reps 10-15             Perform Capillary Blood Glucose checks as needed.  Exercise Prescription Changes:   Exercise Prescription Changes     Row Name 01/31/23 1600 02/11/23 0900 03/01/23 0700 03/04/23 1700 03/12/23 1400     Response to Exercise   Blood Pressure (Admit) 116/68 110/58 98/60 -- 122/80   Blood Pressure (Exercise) 122/64 150/70 142/82 -- 134/60   Blood Pressure (Exit) 110/64 94/60 96/60  -- 120/60   Heart Rate (Admit) 74 bpm 78 bpm 91 bpm -- 87 bpm   Heart Rate (Exercise) 93 bpm 110 bpm 125 bpm -- 120 bpm   Heart Rate (Exit) 75 bpm 67 bpm 89 bpm -- 79 bpm   Oxygen Saturation (Admit) 98 % -- -- -- --   Oxygen Saturation (Exercise) 99 % -- -- -- --   Rating  of Perceived Exertion (Exercise) 9 14 14  -- 13   Perceived Dyspnea (Exercise) 0 -- -- -- --   Symptoms None none none -- none   Comments Results -- -- -- --   Duration -- Progress to 30 minutes of  aerobic without signs/symptoms of physical distress Continue with 30 min of aerobic exercise without signs/symptoms of physical distress. Continue with 30 min of aerobic exercise without signs/symptoms of physical distress. Continue with 30 min of aerobic exercise without signs/symptoms of physical distress.   Intensity -- THRR unchanged THRR unchanged THRR unchanged THRR unchanged     Progression   Progression -- Continue to progress workloads to maintain intensity without signs/symptoms of physical distress. Continue to progress workloads to maintain intensity without signs/symptoms of physical distress. Continue to progress workloads to maintain intensity without signs/symptoms of physical distress. Continue to progress workloads to maintain intensity without signs/symptoms of physical distress.   Average METs -- 2.96 3.37 3.37 3.48     Resistance Training   Training Prescription -- Yes Yes Yes Yes   Weight -- 4 5 lb 5 lb 5 lb   Reps -- 10-15 10-15 10-15 10-15     Interval Training   Interval Training -- No No No No     Treadmill   MPH -- 2.4 2.6 2.6 2.4   Grade -- 4 5 5 7    Minutes -- 15 15 15 15    METs -- 4.16 4.78 4.78 5.15     NuStep   Level -- 2  T6 4  T6 nustep 4  T6 nustep 4  T6   Minutes -- 15 15 15 15    METs -- 2 2.5 2.5 2.4     Biostep-RELP   Level -- 2 3 3 4    Minutes -- 15 15 15 15    METs -- 3 2 2 3      Home Exercise Plan   Plans to continue exercise at -- -- -- [employer] (comment)  [facility] (comment)  [employer]   Frequency -- -- -- Add 2 additional days to program exercise sessions. Add 2 additional days to program exercise sessions.   Initial Home Exercises Provided -- -- -- 03/04/23 03/04/23     Oxygen   Maintain Oxygen  Saturation -- 88% or higher 88% or higher 88% or higher 88% or higher    Row Name 03/26/23 0700 04/10/23 1100 04/24/23 1500 05/08/23 1400       Response to Exercise   Blood Pressure (Admit) 114/62 104/64 136/70 122/72    Blood Pressure (Exercise)  118/68 -- -- --    Blood Pressure (Exit) 106/60 96/58 104/60 100/68    Heart Rate (Admit) 77 bpm 87 bpm 87 bpm 85 bpm    Heart Rate (Exercise) 106 bpm 118 bpm 113 bpm 120 bpm    Heart Rate (Exit) 74 bpm 79 bpm 81 bpm 92 bpm    Rating of Perceived Exertion (Exercise) 13 15 13 15     Perceived Dyspnea (Exercise) 0 -- 0 0    Symptoms none none none none    Duration Continue with 30 min of aerobic exercise without signs/symptoms of physical distress. Continue with 30 min of aerobic exercise without signs/symptoms of physical distress. Continue with 30 min of aerobic exercise without signs/symptoms of physical distress. Continue with 30 min of aerobic exercise without signs/symptoms of physical distress.    Intensity THRR unchanged THRR unchanged THRR unchanged THRR unchanged      Progression   Progression Continue to progress workloads to maintain intensity without signs/symptoms of physical distress. Continue to progress workloads to maintain intensity without signs/symptoms of physical distress. Continue to progress workloads to maintain intensity without signs/symptoms of physical distress. Continue to progress workloads to maintain intensity without signs/symptoms of physical distress.    Average METs 3.82 3.8 3.45 5      Resistance Training   Training Prescription Yes Yes Yes Yes    Weight 5 lb 5 lb 5 lb 5 lb    Reps 10-15 10-15 10-15 10-15      Interval Training   Interval Training No No No No      Treadmill   MPH 2.8 2.8 2.5 3    Grade 7 6 6.5 7    Minutes 15 15 15 15     METs 5.84 5.46 5.15 5.67      NuStep   Level 4  T6 4  T6 nustep 3  T6 5  T6    Minutes 15 15 15 15     METs 2 2.7 2 3.1      Biostep-RELP   Level -- 3 2 2      Minutes -- 15 15 15     METs -- 3 2 --      Home Exercise Plan   Plans to continue exercise at [employer] (comment)  [employer] (comment)  [employer] (comment)  [employer] (comment)  [employer]    Frequency Add 2 additional days to program exercise sessions. Add 2 additional days to program exercise sessions. Add 2 additional days to program exercise sessions. Add 2 additional days to program exercise sessions.    Initial Home Exercises Provided 03/04/23 03/04/23 03/04/23 03/04/23      Oxygen   Maintain Oxygen Saturation 88% or higher 88% or higher 88% or higher 88% or higher             Exercise Comments:   Exercise Comments     Row Name 02/04/23 1725           Exercise Comments First full day of exercise!  Patient was oriented to gym and equipment including functions, settings, policies, and procedures.  Patient's individual exercise prescription and treatment plan were reviewed.  All starting workloads were established based on the results of the 6 minute walk test done at initial orientation visit.  The plan for exercise progression was also introduced and progression will be customized based on patient's performance and goals.  Marland Kitchen  Exercise Goals and Review:   Exercise Goals     Row Name 01/31/23 1608             Exercise Goals   Increase Physical Activity Yes       Intervention Develop an individualized exercise prescription for aerobic and resistive training based on initial evaluation findings, risk stratification, comorbidities and participant's personal goals.;Provide advice, education, support and counseling about physical activity/exercise needs.       Expected Outcomes Short Term: Attend rehab on a regular basis to increase amount of physical activity.;Long Term: Add in home exercise to make exercise part of routine and to increase amount of physical activity.;Long  Term: Exercising regularly at least 3-5 days a week.       Increase Strength and Stamina Yes       Intervention Provide advice, education, support and counseling about physical activity/exercise needs.;Develop an individualized exercise prescription for aerobic and resistive training based on initial evaluation findings, risk stratification, comorbidities and participant's personal goals.       Expected Outcomes Short Term: Perform resistance training exercises routinely during rehab and add in resistance training at home;Short Term: Increase workloads from initial exercise prescription for resistance, speed, and METs.;Long Term: Improve cardiorespiratory fitness, muscular endurance and strength as measured by increased METs and functional capacity ( )       Able to understand and use rate of perceived exertion (RPE) scale Yes       Intervention Provide education and explanation on how to use RPE scale       Expected Outcomes Short Term: Able to use RPE daily in rehab to express subjective intensity level;Long Term:  Able to use RPE to guide intensity level when exercising independently       Able to understand and use Dyspnea scale Yes       Intervention Provide education and explanation on how to use Dyspnea scale       Expected Outcomes Short Term: Able to use Dyspnea scale daily in rehab to express subjective sense of shortness of breath during exertion       Knowledge and understanding of Target Heart Rate Range (THRR) Yes       Intervention Provide education and explanation of THRR including how the numbers were predicted and where they are located for reference       Expected Outcomes Short Term: Able to state/look up THRR;Long Term: Able to use THRR to govern intensity when exercising independently;Short Term: Able to use daily as guideline for intensity in rehab       Able to check pulse independently Yes       Intervention Provide education and demonstration on how to check pulse in  carotid and radial arteries.;Review the importance of being able to check your own pulse for safety during independent exercise       Expected Outcomes Short Term: Able to explain why pulse checking is important during independent exercise;Long Term: Able to check pulse independently and accurately       Understanding of Exercise Prescription Yes       Intervention Provide education, explanation, and written materials on patient's individual exercise prescription       Expected Outcomes Short Term: Able to explain program exercise prescription;Long Term: Able to explain home exercise prescription to exercise independently                Exercise Goals Re-Evaluation :  Exercise Goals Re-Evaluation     Row Name 02/04/23 1726 02/11/23 0946  03/01/23 0727 03/04/23 1751 03/12/23 1446     Exercise Goal Re-Evaluation   Exercise Goals Review Increase Physical Activity;Able to understand and use rate of perceived exertion (RPE) scale;Knowledge and understanding of Target Heart Rate Range (THRR);Understanding of Exercise Prescription;Increase Strength and Stamina;Able to check pulse independently Increase Physical Activity;Increase Strength and Stamina;Understanding of Exercise Prescription Increase Physical Activity;Increase Strength and Stamina;Understanding of Exercise Prescription Increase Physical Activity;Able to understand and use rate of perceived exertion (RPE) scale;Able to understand and use Dyspnea scale;Knowledge and understanding of Target Heart Rate Range (THRR);Able to check pulse independently;Increase Strength and Stamina;Understanding of Exercise Prescription Increase Physical Activity;Increase Strength and Stamina;Understanding of Exercise Prescription   Comments Reviewed RPE  and dyspnea scale, THR and program prescription with pt today.  Pt voiced understanding and was given a copy of goals to take home. Makhari is off to a good start in the program. She recently attended her first  session and continues to become familiar with the program and her exercise prescription. She has increased her treadmill grade from 0% to 4% and has added the Biostep to her current prescription. We will continue to monitor her progress in the program. Olis is doing well in the program. She recently increased her treadmill workload to a speed of 2.6 mph with a 5% incline. She also improved to level 4 on the T6 nustep and level 3 on the biostep. She increased from 4 lb to 5 lb hand weights for resistance training as well. We will continue to monitor her progress in the program. Reviewed home exercise with pt today.  Pt plans to walk and go to [employer] for exercise.  Reviewed THR, pulse, RPE, sign and symptoms, pulse oximetery and when to call 911 or MD.  Also discussed weather considerations and indoor options.  Pt voiced understanding. Jakayla continues to do well in rehab. She increased her treadmill workload to a speed of 2.4 mph with an incline of 7%. She also continues to work at level 4 on the T6 nustep and improved to level 6 on the biostep. We will continue to monitor her progress in the program.   Expected Outcomes Short: Use RPE daily to regulate intensity.  Long: Follow program prescription in THR. Short: Continue to follow current exercise prescription, and progressively increase workloads. Long: Continue exercise to improve strength and stamina. Short: Continue to progressively increase workloads. Long: Continue exercise to improve strength and stamina. Short: add 1-2 days per week of exercise at home on off days of rehab. Long: become independent with exercise routine. Short: Try level 5 on the T6 nustep. Long: Continue to increase overall METs and stamina.    Row Name 03/26/23 0758 04/10/23 1123 04/24/23 1505 04/29/23 1731 05/08/23 1421     Exercise Goal Re-Evaluation   Exercise Goals Review Increase Physical Activity;Increase Strength and Stamina;Understanding of Exercise Prescription  Increase Physical Activity;Increase Strength and Stamina;Understanding of Exercise Prescription Increase Physical Activity;Increase Strength and Stamina;Understanding of Exercise Prescription Increase Physical Activity;Increase Strength and Stamina;Understanding of Exercise Prescription Increase Physical Activity;Increase Strength and Stamina;Understanding of Exercise Prescription   Comments Jozelyn continues to do well in rehab. She has been able to increase her speed on the treadmill from 2.4 to 2.106mph. She also has been able to maintain intensity of level 4 the T6 nustep. We will continue to monitor her progress in the program. Melynn is doing well in rehab. She has stayed consistent on the treadmill at a speed of 2.8 mph and an incline of 6%. She  also continues to work at level 3 on the biostep and level 4 on the T6 nustep. We will continue to monitor her progress in the program. Sakia continues to do well in rehab. She has been able to gradually increase her treadmill workloads, during this review she was able to top out at 2.5 mph and 6.5% incline. She has also maintained a level of 3 on the T6 nustep. We will continue to monitor her progress in the program. Novel states she is going to go to [employer] when she is done with [facility]. She has been doing well in the program and has been increasing her levels. She is not to far from [employer] and weill try to go four days a week. Maribi continues to make improvements in rehab. She has been able to increase her level on the T6 nustep from level 3 to 5. She was also able to increase both her speed and grade on the treadmill to 3 mph at 7% grade. We will continue to monitor her progress in the program.   Expected Outcomes Short: Try level 5 on the T6 nustep. Long: Continue to increase overall METs and stamina. Short: Try level 5 on the T6 nustep. Long: Continue to increase overall METs and stamina. Short: Continue to increase treadmill workload,  increase level on the T6 nustep and biostep. Long: Continue exercise to improve strength and stamina. Short: graduate [facility]. Long: workout routinely independently. Short: Graduate. Long: Continue exercise to improve strength and stamina.    Row Name 05/20/23 1008             Exercise Goal Re-Evaluation   Exercise Goals Review Increase Physical Activity;Increase Strength and Stamina;Understanding of Exercise Prescription       Comments Copeland has not attended rehab since 11/13. We will try to reach out to her in order to determine her status. We will continue to monitor her progress in the program upon her return.       Expected Outcomes Short: Return to rehab. Long: Continue exercise to improve strength and stamina.                Discharge Exercise Prescription (Final Exercise Prescription Changes):  Exercise Prescription Changes - 05/08/23 1400       Response to Exercise   Blood Pressure (Admit) 122/72    Blood Pressure (Exit) 100/68    Heart Rate (Admit) 85 bpm    Heart Rate (Exercise) 120 bpm    Heart Rate (Exit) 92 bpm    Rating of Perceived Exertion (Exercise) 15    Perceived Dyspnea (Exercise) 0    Symptoms none    Duration Continue with 30 min of aerobic exercise without signs/symptoms of physical distress.    Intensity THRR unchanged      Progression   Progression Continue to progress workloads to maintain intensity without signs/symptoms of physical distress.    Average METs 5      Resistance Training   Training Prescription Yes    Weight 5 lb    Reps 10-15      Interval Training   Interval Training No      Treadmill   MPH 3    Grade 7    Minutes 15    METs 5.67      NuStep   Level 5   T6   Minutes 15    METs 3.1      Biostep-RELP   Level 2    Minutes 15  Home Exercise Plan   Plans to continue exercise at [facility] (comment)   [employer]   Frequency Add 2 additional days to program exercise sessions.    Initial Home  Exercises Provided 03/04/23      Oxygen   Maintain Oxygen Saturation 88% or higher             Nutrition:  Target Goals: Understanding of nutrition guidelines, daily intake of sodium 1500mg , cholesterol 200mg , calories 30% from fat and 7% or less from saturated fats, daily to have 5 or more servings of fruits and vegetables.  Education: All About Nutrition: -Group instruction provided by verbal, written material, interactive activities, discussions, models, and posters to present general guidelines for heart healthy nutrition including fat, fiber, MyPlate, the role of sodium in heart healthy nutrition, utilization of the nutrition label, and utilization of this knowledge for meal planning. Follow up email sent as well. Written material given at graduation. Flowsheet Row Cardiac Rehab from 02/20/2023 in [facility] Cardiac and Pulmonary Rehab  Education need identified 01/31/23  Date 02/20/23  Educator JG  Instruction Review Code 1- Verbalizes Understanding       Biometrics:  Pre Biometrics - 01/31/23 1609       Pre Biometrics   Height 5' 4.5" (1.638 m)    Weight 162 lb 4.8 oz (73.6 kg)    Waist Circumference 37 inches    Hip Circumference 38 inches    Waist to Hip Ratio 0.97 %    BMI (Calculated) 27.44    Single Leg Stand 25 seconds              Nutrition Therapy Plan and Nutrition Goals:   Nutrition Assessments:  MEDIFICTS Score Key: >=70 Need to make dietary changes  40-70 Heart Healthy Diet <= 40 Therapeutic Level Cholesterol Diet  Flowsheet Row Cardiac Rehab from 01/31/2023 in [facility] Cardiac and Pulmonary Rehab  Picture Your Plate Total Score on Admission 38      Picture Your Plate Scores: <40 Unhealthy dietary pattern with much room for improvement. 41-50 Dietary pattern unlikely to meet recommendations for good health and room for improvement. 51-60 More healthful dietary pattern, with some room for improvement.  >60 Healthy dietary pattern, although there  may be some specific behaviors that could be improved.    Nutrition Goals Re-Evaluation:  Nutrition Goals Re-Evaluation     Row Name 03/04/23 1755 04/29/23 1729           Goals   Nutrition Goal reschedule missed appointment with RD reschedule missed appointment with RD      Comment reschedule missed appointment with RD Patient would still like to meet with the dietitian.      Expected Outcome Short: meet with RD. Long: establish and follow goals set by RD. Short: meet with RD. Long: establish and follow goals set by RD.               Nutrition Goals Discharge (Final Nutrition Goals Re-Evaluation):  Nutrition Goals Re-Evaluation - 04/29/23 1729       Goals   Nutrition Goal reschedule missed appointment with RD    Comment Patient would still like to meet with the dietitian.    Expected Outcome Short: meet with RD. Long: establish and follow goals set by RD.             Psychosocial: Target Goals: Acknowledge presence or absence of significant depression and/or stress, maximize coping skills, provide positive support system. Participant is able to verbalize types and  ability to use techniques and skills needed for reducing stress and depression.   Education: Stress, Anxiety, and Depression - Group verbal and visual presentation to define topics covered.  Reviews how body is impacted by stress, anxiety, and depression.  Also discusses healthy ways to reduce stress and to treat/manage anxiety and depression.  Written material given at graduation.   Education: Sleep Hygiene -Provides group verbal and written instruction about how sleep can affect your health.  Define sleep hygiene, discuss sleep cycles and impact of sleep habits. Review good sleep hygiene tips.    Initial Review & Psychosocial Screening:  Initial Psych Review & Screening - 01/07/23 1027       Initial Review   Current issues with None Identified      Family Dynamics   Good Support System? Yes    daughter, son, mama     Barriers   Psychosocial barriers to participate in program There are no identifiable barriers or psychosocial needs.      Screening Interventions   Interventions Encouraged to exercise;To provide support and resources with identified psychosocial needs;Provide feedback about the scores to participant    Expected Outcomes Short Term goal: Utilizing psychosocial counselor, staff and physician to assist with identification of specific Stressors or current issues interfering with healing process. Setting desired goal for each stressor or current issue identified.;Long Term Goal: Stressors or current issues are controlled or eliminated.;Short Term goal: Identification and review with participant of any Quality of Life or Depression concerns found by scoring the questionnaire.;Long Term goal: The participant improves quality of Life and PHQ9 Scores as seen by post scores and/or verbalization of changes             Quality of Life Scores:   Quality of Life - 01/31/23 1556       Quality of Life   Select Quality of Life      Quality of Life Scores   Health/Function Pre 23.2 %    Socioeconomic Pre 23.63 %    Psych/Spiritual Pre 24.79 %    Family Pre 24 %    GLOBAL Pre 23.73 %            Scores of 19 and below usually indicate a poorer quality of life in these areas.  A difference of  2-3 points is a clinically meaningful difference.  A difference of 2-3 points in the total score of the Quality of Life Index has been associated with significant improvement in overall quality of life, self-image, physical symptoms, and general health in studies assessing change in quality of life.  PHQ-9: Review Flowsheet  More data exists      03/18/2023 01/31/2023 01/11/2023 12/14/2022 11/06/2022  Depression screen PHQ 2/9  Decreased Interest 0 1 1 1 1   Down, Depressed, Hopeless 0 0 0 1 1  PHQ - 2 Score 0 1 1 2 2   Altered sleeping 0 1 1 0 0  Tired, decreased energy 0 1 1 0 0   Change in appetite 0 1 0 0 0  Feeling bad or failure about yourself  0 0 1 1 0  Trouble concentrating 0 0 0 0 0  Moving slowly or fidgety/restless 0 0 0 0 0  Suicidal thoughts 0 0 0 0 0  PHQ-9 Score 0 4 4 3 2   Difficult doing work/chores Not difficult at all Not difficult at all Not difficult at all Somewhat difficult Somewhat difficult   Interpretation of Total Score  Total Score Depression Severity:  1-4 =  Minimal depression, 5-9 = Mild depression, 10-14 = Moderate depression, 15-19 = Moderately severe depression, 20-27 = Severe depression   Psychosocial Evaluation and Intervention:  Psychosocial Evaluation - 01/07/23 1033       Psychosocial Evaluation & Interventions   Interventions Encouraged to exercise with the program and follow exercise prescription    Comments Zofia has no barriers to attending the program.  She has a good support system with her daughter, son and her Mother.  She hopes to learn her heart rate limits and how to manage her heart disease so she can return to hiking and visiting waterfalls.    Expected Outcomes STG Tillman Sers is able to attend all scheduled sessions and is able to see progression with her exercise. Her goal is to have stamina to get back to hiking and visiting waterfalls. LTG Michaiah has returned to her hikes and visiting warefalls without any cardiac concerns.    Continue Psychosocial Services  Follow up required by staff             Psychosocial Re-Evaluation:  Psychosocial Re-Evaluation     Row Name 03/04/23 1757 04/29/23 1727           Psychosocial Re-Evaluation   Current issues with None Identified Current Anxiety/Panic;Current Depression      Comments Torii has no new sleep, stress, or mental health concerns. Anakarina has been taking medication for her depression and anxiety after her surgery. The stress of the surgery and her underlying anxiety led her to need medication. She ahs been dealing with her Uncle who had prostate cancer and  was out last week due to driving her mother around.      Expected Outcomes Short: continue to attend cardiac rehab consistently for mental health benefits. Long: maintain good mental health routine. Short: continue medication as needed. Long: maintain a positive outlook on her mental health.      Interventions Encouraged to attend Cardiac Rehabilitation for the exercise Encouraged to attend Cardiac Rehabilitation for the exercise      Continue Psychosocial Services  Follow up required by staff Follow up required by staff               Psychosocial Discharge (Final Psychosocial Re-Evaluation):  Psychosocial Re-Evaluation - 04/29/23 1727       Psychosocial Re-Evaluation   Current issues with Current Anxiety/Panic;Current Depression    Comments Kilie has been taking medication for her depression and anxiety after her surgery. The stress of the surgery and her underlying anxiety led her to need medication. She ahs been dealing with her Uncle who had prostate cancer and was out last week due to driving her mother around.    Expected Outcomes Short: continue medication as needed. Long: maintain a positive outlook on her mental health.    Interventions Encouraged to attend Cardiac Rehabilitation for the exercise    Continue Psychosocial Services  Follow up required by staff             Vocational Rehabilitation: Provide vocational rehab assistance to qualifying candidates.   Vocational Rehab Evaluation & Intervention:  Vocational Rehab - 03/04/23 1756       Initial Vocational Rehab Evaluation & Intervention   Assessment shows need for Vocational Rehabilitation No      Vocational Rehab Re-Evaulation   Comments currently back at work             Education: Education Goals: Education classes will be provided on a variety of topics geared toward better understanding of heart  health and risk factor modification. Participant will state understanding/return demonstration of topics  presented as noted by education test scores.  Learning Barriers/Preferences:  Learning Barriers/Preferences - 01/07/23 1029       Learning Barriers/Preferences   Learning Barriers None    Learning Preferences None             General Cardiac Education Topics:  AED/CPR: - Group verbal and written instruction with the use of models to demonstrate the basic use of the AED with the basic ABC's of resuscitation.   Anatomy and Cardiac Procedures: - Group verbal and visual presentation and models provide information about basic cardiac anatomy and function. Reviews the testing methods done to diagnose heart disease and the outcomes of the test results. Describes the treatment choices: Medical Management, Angioplasty, or Coronary Bypass Surgery for treating various heart conditions including Myocardial Infarction, Angina, Valve Disease, and Cardiac Arrhythmias.  Written material given at graduation. Flowsheet Row Cardiac Rehab from 02/20/2023 in [facility] Cardiac and Pulmonary Rehab  Education need identified 01/31/23       Medication Safety: - Group verbal and visual instruction to review commonly prescribed medications for heart and lung disease. Reviews the medication, class of the drug, and side effects. Includes the steps to properly store meds and maintain the prescription regimen.  Written material given at graduation.   Intimacy: - Group verbal instruction through game format to discuss how heart and lung disease can affect sexual intimacy. Written material given at graduation..   Know Your Numbers and Heart Failure: - Group verbal and visual instruction to discuss disease risk factors for cardiac and pulmonary disease and treatment options.  Reviews associated critical values for Overweight/Obesity, Hypertension, Cholesterol, and Diabetes.  Discusses basics of heart failure: signs/symptoms and treatments.  Introduces Heart Failure Zone chart for action plan for heart failure.   Written material given at graduation.   Infection Prevention: - Provides verbal and written material to individual with discussion of infection control including proper hand washing and proper equipment cleaning during exercise session. Flowsheet Row Cardiac Rehab from 02/20/2023 in [facility] Cardiac and Pulmonary Rehab  Date 01/31/23  Educator NT  Instruction Review Code 1- Verbalizes Understanding       Falls Prevention: - Provides verbal and written material to individual with discussion of falls prevention and safety. Flowsheet Row Cardiac Rehab from 02/20/2023 in [facility] Cardiac and Pulmonary Rehab  Date 01/07/23  Educator SB  Instruction Review Code 1- Verbalizes Understanding       Other: -Provides group and verbal instruction on various topics (see comments)   Knowledge Questionnaire Score:  Knowledge Questionnaire Score - 01/31/23 1556       Knowledge Questionnaire Score   Pre Score 20/26             Core Components/Risk Factors/Patient Goals at Admission:  Personal Goals and Risk Factors at Admission - 01/07/23 1131       Core Components/Risk Factors/Patient Goals on Admission   Number of packs per day Smoking cessation instruction/counseling given:  counseled patient on the dangers of tobacco use, advised patient to stop smoking, and reviewed strategies to maximize success. Megha is a current tobacco user. Intervention for tobacco cessation was provided at the initial medical review. She was asked about readiness to quit and reported that she is weaning slowly to a Quit date of 06/12/2023 . Patient was advised and educated about tobacco cessation using combination therapy, tobacco cessation classes, quit line, and quit smoking apps. Patient demonstrated understanding of this  material. Staff will continue to provide encouragement and follow up with the patient throughout the program.             Education:Diabetes - Individual verbal and written instruction to  review signs/symptoms of diabetes, desired ranges of glucose level fasting, after meals and with exercise. Acknowledge that pre and post exercise glucose checks will be done for 3 sessions at entry of program. Flowsheet Row Cardiac Rehab from 02/20/2023 in [facility] Cardiac and Pulmonary Rehab  Date 01/31/23  Educator NT  Instruction Review Code 1- Verbalizes Understanding       Core Components/Risk Factors/Patient Goals Review:   Goals and Risk Factor Review     Row Name 03/04/23 1758 04/29/23 1724           Core Components/Risk Factors/Patient Goals Review   Personal Goals Review Diabetes;Tobacco Cessation Tobacco Cessation      Review Patient reported that she continues to use nicotine patch and it is helpful. She has cut back to about 6 cigarettes a day. She also monitors her blood sugars at home and states they are under control. Her las A1C is reported to be around 6.9. Alexandrya is doing well with her smoking habit and is willing to quit. She is still getting up in the morning and craving after she eats. Dorna is not taking any medications currently to help her quit smoking. Informed her of certain techniques to use to help her quit.      Expected Outcomes Short: continue to monitor blood sugars at home and maintain A1C below 7%. Continue to cut back on amount of cigarettes.. Long: quit smoking. Short: continue to reduce tobacco use. Long: be tobacco free.               Core Components/Risk Factors/Patient Goals at Discharge (Final Review):   Goals and Risk Factor Review - 04/29/23 1724       Core Components/Risk Factors/Patient Goals Review   Personal Goals Review Tobacco Cessation    Review Kennisha is doing well with her smoking habit and is willing to quit. She is still getting up in the morning and craving after she eats. Leileen is not taking any medications currently to help her quit smoking. Informed her of certain techniques to use to help her quit.    Expected Outcomes Short:  continue to reduce tobacco use. Long: be tobacco free.             ITP Comments:  ITP Comments     Row Name 01/07/23 1131 01/31/23 1553 02/04/23 1725 02/20/23 1056 03/20/23 1222   ITP Comments irtual orientation call completed today. shehas an appointment on Date: 01/31/2023  for EP eval and gym Orientation.  Documentation of diagnosis can be found in [facility] 10/19/2022 .   Smoking cessation instruction/counseling given:  counseled patient on the dangers of tobacco use, advised patient to stop smoking, and reviewed strategies to maximize success. Life is a current tobacco user. Intervention for tobacco cessation was provided at the initial medical review. She was asked about readiness to quit and reported that she is weaning slowly to a Quit date of 06/12/2023 . Patient was advised and educated about tobacco cessation using combination therapy, tobacco cessation classes, quit line, and quit smoking apps. Patient demonstrated understanding of this material. Staff will continue to provide encouragement and follow up with the patient throughout the program. Completed and gym orientation. Initial ITP created and sent for review to Dr. Bethann Punches, Medical Director. First full day  of exercise!  Patient was oriented to gym and equipment including functions, settings, policies, and procedures.  Patient's individual exercise prescription and treatment plan were reviewed.  All starting workloads were established based on the results of the 6 minute walk test done at initial orientation visit.  The plan for exercise progression was also introduced and progression will be customized based on patient's performance and goals.  . 30 Day review completed. Medical Director ITP review done, changes made as directed, and signed approval by Medical Director.     new to program 30 Day review completed. Medical Director ITP review done, changes made as directed, and signed approval by Medical Director.    Row Name  04/17/23 1311 05/08/23 1011 05/20/23 1454 06/05/23 1120     ITP Comments 30 Day review completed. Medical Director ITP review done, changes made as directed, and signed approval by Medical Director. 30 Day review completed. Medical Director ITP review done, changes made as directed, and signed approval by Medical Director. Called to check on Cassi. She has not been to rehab since 11/13. She had been out of town and states she plans to return this Wednesday 12/4. Appt desk updated. 30 Day review completed. Medical Director ITP review done, changes made as directed, and signed approval by Medical Director.   only 2 visits this 30 days             Comments:

## 2023-06-17 ENCOUNTER — Encounter: Payer: Self-pay | Admitting: Physician Assistant

## 2023-06-17 ENCOUNTER — Ambulatory Visit (INDEPENDENT_AMBULATORY_CARE_PROVIDER_SITE_OTHER): Payer: 59 | Admitting: Physician Assistant

## 2023-06-17 VITALS — BP 122/68 | HR 94 | Resp 16 | Ht 64.0 in | Wt 169.0 lb

## 2023-06-17 DIAGNOSIS — E559 Vitamin D deficiency, unspecified: Secondary | ICD-10-CM

## 2023-06-17 DIAGNOSIS — E785 Hyperlipidemia, unspecified: Secondary | ICD-10-CM

## 2023-06-17 DIAGNOSIS — Z7984 Long term (current) use of oral hypoglycemic drugs: Secondary | ICD-10-CM

## 2023-06-17 DIAGNOSIS — F339 Major depressive disorder, recurrent, unspecified: Secondary | ICD-10-CM

## 2023-06-17 DIAGNOSIS — J449 Chronic obstructive pulmonary disease, unspecified: Secondary | ICD-10-CM

## 2023-06-17 DIAGNOSIS — F17200 Nicotine dependence, unspecified, uncomplicated: Secondary | ICD-10-CM | POA: Diagnosis not present

## 2023-06-17 DIAGNOSIS — F5105 Insomnia due to other mental disorder: Secondary | ICD-10-CM

## 2023-06-17 DIAGNOSIS — E1169 Type 2 diabetes mellitus with other specified complication: Secondary | ICD-10-CM

## 2023-06-17 DIAGNOSIS — E1165 Type 2 diabetes mellitus with hyperglycemia: Secondary | ICD-10-CM

## 2023-06-17 DIAGNOSIS — I10 Essential (primary) hypertension: Secondary | ICD-10-CM

## 2023-06-17 MED ORDER — ROSUVASTATIN CALCIUM 40 MG PO TABS: 40.0000 mg | ORAL_TABLET | Freq: Every day | ORAL | 1 refills | Status: DC

## 2023-06-17 MED ORDER — DULOXETINE HCL 60 MG PO CPEP: 60.0000 mg | ORAL_CAPSULE | Freq: Two times a day (BID) | ORAL | 1 refills | Status: DC

## 2023-06-17 MED ORDER — DAPAGLIFLOZIN PROPANEDIOL 10 MG PO TABS: 10.0000 mg | ORAL_TABLET | Freq: Every day | ORAL | 1 refills | Status: DC

## 2023-06-17 MED ORDER — TRAZODONE HCL 100 MG PO TABS: 100.0000 mg | ORAL_TABLET | Freq: Every day | ORAL | 1 refills | Status: DC

## 2023-06-17 MED ORDER — ALBUTEROL SULFATE HFA 108 (90 BASE) MCG/ACT IN AERS: 2.0000 | INHALATION_SPRAY | Freq: Four times a day (QID) | RESPIRATORY_TRACT | 0 refills | Status: DC | PRN

## 2023-06-17 NOTE — Assessment & Plan Note (Signed)
Chronic, historic condition Most recent A1c was 6.0% She is taking Rybelsus 7 mg PO every day, Farxiga 10 mg PO every day  Recheck A1c today. Results to dictate further management  Continue current regimen for now. Encouraged dietary changes as these have likely led to further improvement  She reports nausea has improved since taking Rybelsus with food and she is amenable to increasing dose if indicated by A1c results  Follow up in 3 months or sooner if concerns arise

## 2023-06-17 NOTE — Assessment & Plan Note (Signed)
Chronic, ongoing Last Vitamin D level was low and patient was instructed to supplement with OTC Vitamin D Recheck levels today- if not improved will send in weekly  supplement for further management Follow up in 3 months or sooner if concerns arise

## 2023-06-17 NOTE — Assessment & Plan Note (Signed)
Chronic, historic condition Appears to be tolerating Crestor 40 mg PO every day without issues Check lipid panel today Results to dictate further management  Continue with current regimen for now Follow up in 6 months or sooner if concerns arise

## 2023-06-17 NOTE — Assessment & Plan Note (Signed)
Chronic, ongoing She denies coughing or persistent SOB but does admit to SOBOE from time  to time Will send in script for Albuterol rescue inhaler to assist with this  Encouraged smoking cessation efforts- appears she is trying to gradually wean off and has quit date set for the end of Jan  2025 Follow up in 3 months or sooner if concerns arise

## 2023-06-17 NOTE — Assessment & Plan Note (Signed)
Chronic, historic condition Appears well controlled on current regimen comprised of Lisinopril 2.5 mg PO every day and Carvedilol 6.25 mg PO BID  Continue current regimen Continue collaboration with Cardiology  Follow up in 3 months or sooner if concerns arise

## 2023-06-17 NOTE — Progress Notes (Signed)
Established Patient Office Visit  Name: Lauren [name]   MRN: [id number]    DOB: [birth year]   Date:06/17/2023  Today's Provider: Jacquelin Hawking, MHS, PA-C Introduced myself to the patient as a PA-C and provided education on APPs in clinical practice.         Subjective  Chief Complaint  Chief Complaint  Patient presents with   Medical Management of Chronic Issues    3 months   Hypertension   Diabetes    HPI   HYPERTENSION / HYPERLIPIDEMIA Satisfied with current treatment? yes Duration of hypertension: years BP monitoring frequency: not checking BP range:  BP medication side effects: no  BP meds: carvedilol and lisinopril Duration of hyperlipidemia: years Cholesterol medication side effects: no Cholesterol supplements: none  cholesterol medications: rosuvastatin (crestor) Medication compliance: good compliance Aspirin: yes Recent stressors: no Recurrent headaches: no Visual changes: no Palpitations: Maybe a few times- she is not sure  Dyspnea: no Chest pain: no Lower extremity edema: no Dizzy/lightheaded: no  Diabetes, Type 2 - Last A1c 6.0% - Medications: Rybelsus 7 mg PO every day, Farxiga 10 mg PO every day  - Compliance: good She reports improvement in nausea since taking Rybelsus with food- she is amenable to increasing to 14 mg if indicated since side effects are more manageable  - Checking BG at home:  She is checking intermittently, reports fastin AM are usually 100-105. Postprandial have been 180s-200s - Diet:  She is still reducing sugars in diet  - Exercise: She is exercising with Cardio therapy  - Eye exam: She has an apt later in Jan  - Foot exam: UTD - Microalbumin: UTD - Statin: on stain  - PNA vaccine: NA - Denies symptoms of hypoglycemia, polyuria, polydipsia, numbness extremities, foot ulcers/trauma   COPD  She reports her breathing is good She reports some activity limitation - she does not have a rescue inhaler to use for this  at this time She denies coughing or productive coughing     Smoking/ Tobacco use  She is still trying to quit smoking She is smoking about 6 cigarettes a day  She has plans to stop smoking completely at the end of Jan and has nicotine patches at home to help with cravings after cessation    Mood  She reports it is "okay"  She admits to irritability but is not sure of triggers  She thinks her Duloxetine is at an okay dose for now- she is not amenable to a switch right now      06/17/2023   11:21 AM 03/18/2023    2:26 PM 01/31/2023    4:01 PM 01/11/2023    8:59 AM 12/14/2022    2:26 PM  Depression screen PHQ 2/9  Decreased Interest 0 0 1 1 1   Down, Depressed, Hopeless 0 0 0 0 1  PHQ - 2 Score 0 0 1 1 2   Altered sleeping 0 0 1 1 0  Tired, decreased energy 0 0 1 1 0  Change in appetite 0 0 1 0 0  Feeling bad or failure about yourself  0 0 0 1 1  Trouble concentrating 0 0 0 0 0  Moving slowly or fidgety/restless 0 0 0 0 0  Suicidal thoughts 0 0 0 0 0  PHQ-9 Score 0 0 4 4 3   Difficult doing work/chores  Not difficult at all Not difficult at all Not difficult at all Somewhat difficult  03/18/2023    2:30 PM 01/11/2023    9:02 AM 12/14/2022    2:26 PM 11/06/2022    8:25 AM  GAD 7 : Generalized Anxiety Score  Nervous, Anxious, on Edge 0 1 3 0  Control/stop worrying 0 1 3 0  Worry too much - different things 0 1 3 0  Trouble relaxing 0 1 3 0  Restless 0 1 0 0  Easily annoyed or irritable 0 2 1 0  Afraid - awful might happen 0 1 1 0  Total GAD 7 Score 0 8 14 0  Anxiety Difficulty Not difficult at all Somewhat difficult Somewhat difficult Not difficult at all         Patient Active Problem List   Diagnosis Date Noted   Insomnia due to mental disorder 06/17/2023   S/P CABG x 3: LIMA-LAD, SVG-PDA, SVG-OM 10/22/2022   Coronary artery disease involving native coronary artery without angina pectoris 10/19/2022   Chronic obstructive pulmonary disease, unspecified COPD type  (HCC) 10/15/2022   Type 2 diabetes mellitus with hyperglycemia, without long-term current use of insulin (HCC) 08/18/2021   Polyp of transverse colon    Polyp of sigmoid colon    Essential hypertension 05/19/2018   Tobacco dependence 05/18/2018   Allergic rhinitis, seasonal 04/23/2017   Genital herpes 04/23/2017   Major depression, recurrent, chronic (HCC) 05/17/2016   Cholesteatoma of attic of right ear 12/05/2015   GERD (gastroesophageal reflux disease) 11/29/2015   Vitamin D deficiency 12/13/2014   Anxiety and depression 12/13/2014   Hyperlipidemia associated with type 2 diabetes mellitus (HCC) 05/26/2014   Smoker 05/26/2014    Past Surgical History:  Procedure Laterality Date   APPENDECTOMY     CESAREAN SECTION     x 4    COLONOSCOPY     COLONOSCOPY WITH PROPOFOL N/A 07/27/2020   Procedure: COLONOSCOPY WITH PROPOFOL;  Surgeon: Pasty Spillers, MD;  Location: [facility] ENDOSCOPY;  Service: Endoscopy;  Laterality: N/A;  COVID POSITIVE 07/05/2020   CORONARY ARTERY BYPASS GRAFT N/A 10/22/2022   Procedure: CORONARY ARTERY BYPASS GRAFTING (CABG) TIMES THREE USING THE LEFT INTERNAL MAMMARY ARTERY (LIMA) AND ENDOSCOPICALLY HARVESTED RIGHT GREATER SAPHENOUS VEIN;  Surgeon: Corliss Skains, MD;  Location: [facility] OR;  Service: Open Heart Surgery; x 3: LIMA-LAD, SVG-OM, SVG-PDA.   HERNIA REPAIR  09/25/2011   LEFT HEART CATH AND CORONARY ANGIOGRAPHY N/A 10/19/2022   Procedure: LEFT HEART CATH AND CORONARY ANGIOGRAPHY;  Surgeon: Marykay Lex, MD;  Location: [facility] INVASIVE CV LAB;  Service: Cardiovascular; EF 50 to 55%. dLM 90% -< 95% ost LAD & LCx. 99% mRCA & 90% rPAV. => CABG.   TEE WITHOUT CARDIOVERSION N/A 10/22/2022   Procedure: TRANSESOPHAGEAL ECHOCARDIOGRAM;  Surgeon: Corliss Skains, MD;  Location: [facility] OR;  Service: Open Heart Surgery;  Laterality: N/A;   TRANSTHORACIC ECHOCARDIOGRAM  10/19/2022   EF 55 to 60%.  No RWMA.  GR 1 DD.  Normal RV size and function.  Normal AoV, MV.   Normal RAP.   TUBAL LIGATION     TYMPANOMASTOIDECTOMY Right 12/07/2015   Procedure: TYMPANOMASTOIDECTOMY;  Surgeon: Geanie Logan, MD;  Location: [facility] ORS;  Service: ENT;  Laterality: Right;    Family History  Problem Relation Age of Onset   Hypertension Mother    Hyperlipidemia Mother    Diabetes Mother    Arrhythmia Father 47       A-fib   Alcohol abuse Father    Breast cancer Maternal Aunt    Heart disease Brother  43       stent x 1    Alcoholism Brother    Asthma Son    Asperger's syndrome Son     Social History   Tobacco Use   Smoking status: Some Days    Current packs/day: 0.50    Average packs/day: 0.7 packs/day for 63.0 years (45.5 ttl pk-yrs)    Types: Cigarettes    Start date: 1990   Smokeless tobacco: Never   Tobacco comments:    Working down number of cigarettes a day  Hope to have cessation by Christmas 06/12/2023. 8-9 cigarettes a day.   Substance Use Topics   Alcohol use: Not Currently    Alcohol/week: 0.0 standard drinks of alcohol     Current Outpatient Medications:    acetaminophen (TYLENOL) 500 MG tablet, Take 1 tablet (500 mg total) by mouth every 6 (six) hours as needed., Disp: 30 tablet, Rfl: 0   albuterol (VENTOLIN HFA) 108 (90 Base) MCG/ACT inhaler, Inhale 2 puffs into the lungs every 6 (six) hours as needed for wheezing or shortness of breath., Disp: 8 g, Rfl: 0   aspirin EC 81 MG tablet, Take 1 tablet (81 mg total) by mouth daily. Swallow whole., Disp: 90 tablet, Rfl: 3   b complex vitamins capsule, Take 1 capsule by mouth daily., Disp: , Rfl:    carvedilol (COREG) 6.25 MG tablet, Take 1 tablet (6.25 mg total) by mouth 2 (two) times daily., Disp: 180 tablet, Rfl: 0   cetirizine (ZYRTEC) 10 MG tablet, Take 10 mg by mouth daily as needed for allergies., Disp: , Rfl:    fluticasone (FLONASE) 50 MCG/ACT nasal spray, Place 1 spray into both nostrils daily as needed for allergies., Disp: , Rfl:    nicotine (NICODERM CQ) 7 mg/24hr patch, Place 1 patch  (7 mg total) onto the skin daily., Disp: 21 patch, Rfl: 0   nitroGLYCERIN (NITROSTAT) 0.3 MG SL tablet, Place 1 tablet (0.3 mg total) under the tongue every 5 (five) minutes as needed for chest pain., Disp: 90 tablet, Rfl: 12   Semaglutide (RYBELSUS) 7 MG TABS, Take 1 tablet (7 mg total) by mouth daily., Disp: 90 tablet, Rfl: 1   valACYclovir (VALTREX) 1000 MG tablet, TAKE 1 TABLET (1,000 MG TOTAL) BY MOUTH DAILY. TAKE AS NEEDED FOR HERPES OUTBREAK., Disp: 30 tablet, Rfl: 11   dapagliflozin propanediol (FARXIGA) 10 MG TABS tablet, Take 1 tablet (10 mg total) by mouth daily., Disp: 90 tablet, Rfl: 1   DULoxetine (CYMBALTA) 60 MG capsule, Take 1 capsule (60 mg total) by mouth 2 (two) times daily., Disp: 180 capsule, Rfl: 1   lisinopril (ZESTRIL) 2.5 MG tablet, Take 1 tablet (2.5 mg total) by mouth daily., Disp: 90 tablet, Rfl: 3   rosuvastatin (CRESTOR) 40 MG tablet, Take 1 tablet (40 mg total) by mouth daily., Disp: 90 tablet, Rfl: 1   traZODone (DESYREL) 100 MG tablet, Take 1 tablet (100 mg total) by mouth at bedtime., Disp: 90 tablet, Rfl: 1  Allergies  Allergen Reactions   Cephalexin Other (See Comments)    Secondary yeast infection   Metformin And Related Nausea And Vomiting    With all doses and regular and extended release forms    I personally reviewed active problem list, medication list, allergies, health maintenance, notes from last encounter, lab results with the patient/caregiver today.   ROS  See HPI for relevant ROS   Objective  Vitals:   06/17/23 1131  BP: 122/68  Pulse: 94  Resp: 16  SpO2: 96%  Weight: 169 lb (76.7 kg)  Height: 5\' 4"  (1.626 m)    Body mass index is 29.01 kg/m.  Physical Exam Vitals reviewed.  Constitutional:      General: She is awake.     Appearance: Normal appearance. She is well-developed and well-groomed.  HENT:     Head: Normocephalic and atraumatic.  Cardiovascular:     Rate and Rhythm: Normal rate and regular rhythm.     Pulses:  Normal pulses.          Radial pulses are 2+ on the right side and 2+ on the left side.     Heart sounds: Normal heart sounds. No murmur heard.    No friction rub. No gallop.  Pulmonary:     Effort: Pulmonary effort is normal.     Breath sounds: Normal breath sounds. No decreased air movement. No decreased breath sounds, wheezing, rhonchi or rales.  Musculoskeletal:     Cervical back: Normal range of motion.     Right lower leg: No edema.     Left lower leg: No edema.  Neurological:     General: No focal deficit present.     Mental Status: She is alert and oriented to person, place, and time. Mental status is at baseline.     GCS: GCS eye subscore is 4. GCS verbal subscore is 5. GCS motor subscore is 6.  Psychiatric:        Attention and Perception: Attention and perception normal.        Mood and Affect: Mood and affect normal.        Speech: Speech normal.        Behavior: Behavior normal. Behavior is cooperative.        Thought Content: Thought content normal.        Cognition and Memory: Cognition normal.      No results found for this or any previous visit (from the past 2160 hours).   PHQ2/9:    06/17/2023   11:21 AM 03/18/2023    2:26 PM 01/31/2023    4:01 PM 01/11/2023    8:59 AM 12/14/2022    2:26 PM  Depression screen PHQ 2/9  Decreased Interest 0 0 1 1 1   Down, Depressed, Hopeless 0 0 0 0 1  PHQ - 2 Score 0 0 1 1 2   Altered sleeping 0 0 1 1 0  Tired, decreased energy 0 0 1 1 0  Change in appetite 0 0 1 0 0  Feeling bad or failure about yourself  0 0 0 1 1  Trouble concentrating 0 0 0 0 0  Moving slowly or fidgety/restless 0 0 0 0 0  Suicidal thoughts 0 0 0 0 0  PHQ-9 Score 0 0 4 4 3   Difficult doing work/chores  Not difficult at all Not difficult at all Not difficult at all Somewhat difficult      Fall Risk:    06/17/2023   11:21 AM 03/18/2023    2:26 PM 01/11/2023    8:59 AM 01/07/2023   10:22 AM 12/14/2022    2:20 PM  Fall Risk   Falls in the past  year? 0 0 0 0 1  Number falls in past yr: 0 0 0 0 0  Injury with Fall? 0 0 0 0 1  Risk for fall due to : No Fall Risks No Fall Risks  Medication side effect Impaired balance/gait  Follow up Falls prevention discussed Falls prevention discussed;Education provided;Falls evaluation completed  Falls evaluation completed;Education provided;Falls prevention discussed Falls prevention discussed;Education provided;Falls evaluation completed      Functional Status Survey:      Assessment & Plan  Problem List Items Addressed This Visit       Cardiovascular and Mediastinum   Essential hypertension - Primary (Chronic)   Chronic, historic condition Appears well controlled on current regimen comprised of Lisinopril 2.5 mg PO every day and Carvedilol 6.25 mg PO BID  Continue current regimen Continue collaboration with Cardiology  Follow up in 3 months or sooner if concerns arise        Relevant Medications   rosuvastatin (CRESTOR) 40 MG tablet     Respiratory   Chronic obstructive pulmonary disease, unspecified COPD type (HCC) (Chronic)   Chronic, ongoing She denies coughing or persistent SOB but does admit to SOBOE from time  to time Will send in script for Albuterol rescue inhaler to assist with this  Encouraged smoking cessation efforts- appears she is trying to gradually wean off and has quit date set for the end of Jan  2025 Follow up in 3 months or sooner if concerns arise        Relevant Medications   albuterol (VENTOLIN HFA) 108 (90 Base) MCG/ACT inhaler     Endocrine   Hyperlipidemia associated with type 2 diabetes mellitus (HCC) (Chronic)   Chronic, historic condition Appears to be tolerating Crestor 40 mg PO every day without issues Check lipid panel today Results to dictate further management  Continue with current regimen for now Follow up in 6 months or sooner if concerns arise        Relevant Medications   dapagliflozin propanediol (FARXIGA) 10 MG TABS tablet    rosuvastatin (CRESTOR) 40 MG tablet   Other Relevant Orders   Lipid Profile   Type 2 diabetes mellitus with hyperglycemia, without long-term current use of insulin (HCC) (Chronic)   Chronic, historic condition Most recent A1c was 6.0% She is taking Rybelsus 7 mg PO every day, Farxiga 10 mg PO every day  Recheck A1c today. Results to dictate further management  Continue current regimen for now. Encouraged dietary changes as these have likely led to further improvement  She reports nausea has improved since taking Rybelsus with food and she is amenable to increasing dose if indicated by A1c results  Follow up in 3 months or sooner if concerns arise        Relevant Medications   dapagliflozin propanediol (FARXIGA) 10 MG TABS tablet   rosuvastatin (CRESTOR) 40 MG tablet   Other Relevant Orders   HgB A1c     Other   Smoker (Chronic)   Tobacco dependence (Chronic)   Chronic, ongoing  She reports she is gradually reducing her cigarette use- down to about 6 per day  She has goal to quit completely by the end of Jan and has already procured nicotine patches in preparation for quitting  Reviewed importance of continued cessation efforts  Offered assistance as desired to help with cessation goals Total conversation was approx 4 minutes        Vitamin D deficiency   Chronic, ongoing Last Vitamin D level was low and patient was instructed to supplement with OTC Vitamin D Recheck levels today- if not improved will send in weekly  supplement for further management Follow up in 3 months or sooner if concerns arise        Relevant Orders   Vitamin D (25 hydroxy)   Major depression, recurrent, chronic (HCC)  Chronic, stable  She reports mood is overall stable on current regimen Will send in refills for Duloxetine 60 mg PO BID and Trazodone 100 mg PO at bedtime  Reviewed PHQ9 with her today- score of 0 and unchanged from previous assessment  She does report some irritability and asked  if her Duloxetine could be increased but she is on max dose and she is not amenable to regimen changes at this time Continue current regimen as directed Follow up in 6 months or sooner if concerns arise        Relevant Medications   DULoxetine (CYMBALTA) 60 MG capsule   traZODone (DESYREL) 100 MG tablet   Insomnia due to mental disorder   Relevant Medications   traZODone (DESYREL) 100 MG tablet     No follow-ups on file.   I, Aahana Elza E Aysa Larivee, PA-C, have reviewed all documentation for this visit. The documentation on 06/17/23 for the exam, diagnosis, procedures, and orders are all accurate and complete.   Jacquelin Hawking, MHS, PA-C [facility]

## 2023-06-17 NOTE — Assessment & Plan Note (Signed)
Chronic, stable  She reports mood is overall stable on current regimen Will send in refills for Duloxetine 60 mg PO BID and Trazodone 100 mg PO at bedtime  Reviewed PHQ9 with her today- score of 0 and unchanged from previous assessment  She does report some irritability and asked if her Duloxetine could be increased but she is on max dose and she is not amenable to regimen changes at this time Continue current regimen as directed Follow up in 6 months or sooner if concerns arise

## 2023-06-17 NOTE — Assessment & Plan Note (Signed)
Chronic, ongoing  She reports she is gradually reducing her cigarette use- down to about 6 per day  She has goal to quit completely by the end of Jan and has already procured nicotine patches in preparation for quitting  Reviewed importance of continued cessation efforts  Offered assistance as desired to help with cessation goals Total conversation was approx 4 minutes

## 2023-06-18 LAB — LIPID PANEL
Cholesterol: 146 mg/dL (ref <200)
HDL: 49 mg/dL — ABNORMAL LOW (ref >=50)
LDL Cholesterol (Calc): 77 mg/dL
Non-HDL Cholesterol (Calc): 97 mg/dL (ref <130)
Total CHOL/HDL Ratio: 3 (calc) (ref <5.0)
Triglycerides: 112 mg/dL (ref <150)

## 2023-06-18 LAB — HEMOGLOBIN A1C
Hgb A1c MFr Bld: 7.2 %{Hb} — ABNORMAL HIGH (ref <5.7)
Mean Plasma Glucose: 160 mg/dL
eAG (mmol/L): 8.9 mmol/L

## 2023-06-18 LAB — VITAMIN D 25 HYDROXY (VIT D DEFICIENCY, FRACTURES): Vit D, 25-Hydroxy: 15 ng/mL — ABNORMAL LOW (ref 30–100)

## 2023-06-19 DIAGNOSIS — Z419 Encounter for procedure for purposes other than remedying health state, unspecified: Secondary | ICD-10-CM | POA: Diagnosis not present

## 2023-06-20 ENCOUNTER — Encounter: Payer: 59 | Attending: Cardiology

## 2023-06-20 DIAGNOSIS — Z951 Presence of aortocoronary bypass graft: Secondary | ICD-10-CM | POA: Insufficient documentation

## 2023-06-24 ENCOUNTER — Ambulatory Visit: Payer: 59

## 2023-06-24 MED ORDER — VITAMIN D (ERGOCALCIFEROL) 1.25 MG (50000 UNIT) PO CAPS: 50000.0000 [IU] | ORAL_CAPSULE | ORAL | 0 refills | Status: DC

## 2023-06-26 ENCOUNTER — Ambulatory Visit: Payer: 59

## 2023-06-27 ENCOUNTER — Ambulatory Visit: Payer: 59

## 2023-07-01 ENCOUNTER — Ambulatory Visit: Payer: 59

## 2023-07-03 ENCOUNTER — Telehealth: Payer: Self-pay | Admitting: Cardiology

## 2023-07-03 ENCOUNTER — Ambulatory Visit: Payer: 59

## 2023-07-03 DIAGNOSIS — Z951 Presence of aortocoronary bypass graft: Secondary | ICD-10-CM

## 2023-07-03 NOTE — Telephone Encounter (Signed)
   Pre-operative Risk Assessment    Patient Name: Lauren [name]  DOB: May 17, 1976 MRN: [id number]   Date of last office visit: 02-21-23 Date of next office visit: NA  Request for Surgical Clearance    Procedure:  Dental Extraction - Amount of Teeth to be Pulled:  Fillings,periodontal cleaning  Date of Surgery:  Clearance TBD                                Surgeon:  Dr. Dian Formosa Surgeon's Group or Practice Name:  Dr. Dian Formosa Phone number:  [phone] Fax number:  [phone]   Type of Clearance Requested:   - Medical    Type of Anesthesia:  Not Indicated   Additional requests/questions:    SignedGeroldine Kotyk   07/03/2023, 11:57 AM

## 2023-07-03 NOTE — Progress Notes (Signed)
 Cardiac Individual Treatment Plan  Patient Details  Name: Lauren [name] MRN: [id number] Date of Birth: [birth year] Referring Provider:   Flowsheet Row Cardiac Rehab from 01/31/2023 in [facility] Cardiac and Pulmonary Rehab  Referring Provider Dr. Addie Holstein       Initial Encounter Date:  Flowsheet Row Cardiac Rehab from 01/31/2023 in [facility] Cardiac and Pulmonary Rehab  Date 01/31/23       Visit Diagnosis: S/P CABG x 3  Patient's Home Medications on Admission:  Current Outpatient Medications:    acetaminophen  (TYLENOL ) 500 MG tablet, Take 1 tablet (500 mg total) by mouth every 6 (six) hours as needed., Disp: 30 tablet, Rfl: 0   albuterol  (VENTOLIN  HFA) 108 (90 Base) MCG/ACT inhaler, Inhale 2 puffs into the lungs every 6 (six) hours as needed for wheezing or shortness of breath., Disp: 8 g, Rfl: 0   aspirin  EC 81 MG tablet, Take 1 tablet (81 mg total) by mouth daily. Swallow whole., Disp: 90 tablet, Rfl: 3   b complex vitamins capsule, Take 1 capsule by mouth daily., Disp: , Rfl:    carvedilol  (COREG ) 6.25 MG tablet, Take 1 tablet (6.25 mg total) by mouth 2 (two) times daily., Disp: 180 tablet, Rfl: 0   cetirizine (ZYRTEC) 10 MG tablet, Take 10 mg by mouth daily as needed for allergies., Disp: , Rfl:    dapagliflozin  propanediol (FARXIGA ) 10 MG TABS tablet, Take 1 tablet (10 mg total) by mouth daily., Disp: 90 tablet, Rfl: 1   DULoxetine  (CYMBALTA ) 60 MG capsule, Take 1 capsule (60 mg total) by mouth 2 (two) times daily., Disp: 180 capsule, Rfl: 1   fluticasone  (FLONASE ) 50 MCG/ACT nasal spray, Place 1 spray into both nostrils daily as needed for allergies., Disp: , Rfl:    lisinopril  (ZESTRIL ) 2.5 MG tablet, Take 1 tablet (2.5 mg total) by mouth daily., Disp: 90 tablet, Rfl: 3   nicotine  (NICODERM CQ ) 7 mg/24hr patch, Place 1 patch (7 mg total) onto the skin daily., Disp: 21 patch, Rfl: 0   nitroGLYCERIN  (NITROSTAT ) 0.3 MG SL tablet, Place 1 tablet (0.3 mg total) under the tongue every 5 (five)  minutes as needed for chest pain., Disp: 90 tablet, Rfl: 12   rosuvastatin  (CRESTOR ) 40 MG tablet, Take 1 tablet (40 mg total) by mouth daily., Disp: 90 tablet, Rfl: 1   Semaglutide  (RYBELSUS ) 7 MG TABS, Take 1 tablet (7 mg total) by mouth daily., Disp: 90 tablet, Rfl: 1   traZODone  (DESYREL ) 100 MG tablet, Take 1 tablet (100 mg total) by mouth at bedtime., Disp: 90 tablet, Rfl: 1   valACYclovir  (VALTREX ) 1000 MG tablet, TAKE 1 TABLET (1,000 MG TOTAL) BY MOUTH DAILY. TAKE AS NEEDED FOR HERPES OUTBREAK., Disp: 30 tablet, Rfl: 11   Vitamin D , Ergocalciferol , (DRISDOL ) 1.25 MG (50000 UNIT) CAPS capsule, Take 1 capsule (50,000 Units total) by mouth every 7 (seven) days., Disp: 12 capsule, Rfl: 0  Past Medical History: Past Medical History:  Diagnosis Date   Allergic rhinitis    Anxiety    Chronic depression    Coronary artery disease involving native coronary artery without angina pectoris 10/19/2022   Cardiac cath 10/19/2022 with distal left main and ostial LAD/LCx disease as well as 95% RCA disease. => Referred for CABG x 3 LIMA-LAD, SVG-PDA, SVG-OM (10/22/2022)     Elevated LFTs    Essential hypertension 05/19/2018   Genital herpes    GERD (gastroesophageal reflux disease)    Hyperlipidemia    S/P CABG x 3: LIMA-LAD, SVG-PDA, SVG-OM 10/22/2022  Smoker 05/26/2014   Type 2 diabetes mellitus with hyperglycemia, without long-term current use of insulin  (HCC) 08/18/2021   Vitamin D  deficiency     Tobacco Use: Social History   Tobacco Use  Smoking Status Some Days   Current packs/day: 0.50   Average packs/day: 0.7 packs/day for 63.0 years (45.5 ttl pk-yrs)   Types: Cigarettes   Start date: 1990  Smokeless Tobacco Never  Tobacco Comments   Working down number of cigarettes a day  Hope to have cessation by Christmas 06/12/2023. 8-9 cigarettes a day.     Labs: Review Flowsheet  More data exists      Latest Ref Rng & Units 10/22/2022 10/23/2022 12/14/2022 03/18/2023 06/17/2023  Labs for ITP  Cardiac and Pulmonary Rehab  Cholestrol <200 mg/dL - - 132  - 440   LDL (calc) mg/dL (calc) - - 46  - 77   HDL-C > OR = 50 mg/dL - - 40  - 49   Trlycerides <150 mg/dL - - 102  - 725   Hemoglobin A1c <5.7 % of total Hgb - - 6.8  6.0  7.2   PH, Arterial 7.35 - 7.45 7.322  7.340  7.271  7.328  7.301  7.396  7.302  7.387  - - -  PCO2 arterial 32 - 48 mmHg 34.0  36.7  35.5  43.4  44.2  38.2  48.1  36.4  - - -  Bicarbonate 20.0 - 28.0 mmol/L 17.5  19.7  16.4  22.9  21.8  24.6  23.4  23.7  21.8  - - -  TCO2 22 - 32 mmol/L 18  21  17  24  25  23  25  26  25  26  26  25  23   - - -  Acid-base deficit 0.0 - 2.0 mmol/L 8.0  5.0  10.0  3.0  4.0  1.0  1.0  3.0  3.0  - - -  O2 Saturation % 95  97  98  95  100  76  100  100  96  - - -    Details       Multiple values from one day are sorted in reverse-chronological order          Exercise Target Goals: Exercise Program Goal: Individual exercise prescription set using results from initial 6 min walk test and THRR while considering  patient's activity barriers and safety.   Exercise Prescription Goal: Initial exercise prescription builds to 30-45 minutes a day of aerobic activity, 2-3 days per week.  Home exercise guidelines will be given to patient during program as part of exercise prescription that the participant will acknowledge.   Education: Aerobic Exercise: - Group verbal and visual presentation on the components of exercise prescription. Introduces F.I.T.T principle from ACSM for exercise prescriptions.  Reviews F.I.T.T. principles of aerobic exercise including progression. Written material given at graduation. Flowsheet Row Cardiac Rehab from 02/20/2023 in [facility] Cardiac and Pulmonary Rehab  Education need identified 01/31/23       Education: Resistance Exercise: - Group verbal and visual presentation on the components of exercise prescription. Introduces F.I.T.T principle from ACSM for exercise prescriptions  Reviews F.I.T.T. principles  of resistance exercise including progression. Written material given at graduation. Flowsheet Row Cardiac Rehab from 02/20/2023 in [facility] Cardiac and Pulmonary Rehab  Date 02/06/23  Educator [facility]  Instruction Review Code 1- [employer] Understanding        Education: Exercise & Equipment Safety: - Individual verbal instruction and  demonstration of equipment use and safety with use of the equipment. Flowsheet Row Cardiac Rehab from 02/20/2023 in [facility] Cardiac and Pulmonary Rehab  Date 01/31/23  Educator [facility]  Instruction Review Code 1- Verbalizes Understanding       Education: Exercise Physiology & General Exercise Guidelines: - Group verbal and written instruction with models to review the exercise physiology of the cardiovascular system and associated critical values. Provides general exercise guidelines with specific guidelines to those with heart or lung disease.    Education: Flexibility, Balance, Mind/Body Relaxation: - Group verbal and visual presentation with interactive activity on the components of exercise prescription. Introduces F.I.T.T principle from ACSM for exercise prescriptions. Reviews F.I.T.T. principles of flexibility and balance exercise training including progression. Also discusses the mind body connection.  Reviews various relaxation techniques to help reduce and manage stress (i.e. Deep breathing, progressive muscle relaxation, and visualization). Balance handout provided to take home. Written material given at graduation. Flowsheet Row Cardiac Rehab from 02/20/2023 in [facility] Cardiac and Pulmonary Rehab  Date 02/06/23  Educator [facility]  Instruction Review Code 1- Verbalizes Understanding       Activity Barriers & Risk Stratification:  Activity Barriers & Cardiac Risk Stratification - 01/31/23 1608       Activity Barriers & Cardiac Risk Stratification   Activity Barriers Joint Problems;Other (comment)    Comments R hip pain    Cardiac Risk Stratification High              6 Minute Walk:  6 Minute Walk     Row Name 01/31/23 1607         6 Minute Walk   Phase Initial     Distance 1165 feet     Walk Time 6 minutes     # of Rest Breaks 0     MPH 2.21     METS 3.74     RPE 9     Perceived Dyspnea  0     VO2 Peak 13.11     Symptoms No     Resting HR 74 bpm     Resting BP 116/68     Resting Oxygen Saturation  98 %     Exercise Oxygen Saturation  during 6 min walk 99 %     Max Ex. HR 93 bpm     Max Ex. BP 122/64     2 Minute Post BP 110/64              Oxygen Initial Assessment:   Oxygen Re-Evaluation:   Oxygen Discharge (Final Oxygen Re-Evaluation):   Initial Exercise Prescription:  Initial Exercise Prescription - 01/31/23 1600       Date of Initial Exercise RX and Referring Provider   Date 01/31/23    Referring Provider Dr. Addie Holstein      Oxygen   Maintain Oxygen Saturation 88% or higher      Treadmill   MPH 2.2    Grade 0    Minutes 15    METs 2.68      NuStep   Level 2    SPM 80    Minutes 15    METs 3.74      T5 Nustep   Level 2   T6 nustep   SPM 80    Minutes 15    METs 3.74      Prescription Details   Frequency (times per week) 3    Duration Progress to 30 minutes of continuous aerobic without signs/symptoms of physical distress  Intensity   THRR 40-80% of Max Heartrate 113-153    Ratings of Perceived Exertion 11-13    Perceived Dyspnea 0-4      Progression   Progression Continue to progress workloads to maintain intensity without signs/symptoms of physical distress.      Resistance Training   Training Prescription Yes    Weight 4 lb    Reps 10-15             Perform Capillary Blood Glucose checks as needed.  Exercise Prescription Changes:   Exercise Prescription Changes     Row Name 01/31/23 1600 02/11/23 0900 03/01/23 0700 03/04/23 1700 03/12/23 1400     Response to Exercise   Blood Pressure (Admit) 116/68 110/58 98/60 -- 122/80   Blood Pressure (Exercise) 122/64 150/70  142/82 -- 134/60   Blood Pressure (Exit) 110/64 94/60 96/60  -- 120/60   Heart Rate (Admit) 74 bpm 78 bpm 91 bpm -- 87 bpm   Heart Rate (Exercise) 93 bpm 110 bpm 125 bpm -- 120 bpm   Heart Rate (Exit) 75 bpm 67 bpm 89 bpm -- 79 bpm   Oxygen Saturation (Admit) 98 % -- -- -- --   Oxygen Saturation (Exercise) 99 % -- -- -- --   Rating of Perceived Exertion (Exercise) 9 14 14  -- 13   Perceived Dyspnea (Exercise) 0 -- -- -- --   Symptoms None none none -- none   Comments Results -- -- -- --   Duration -- Progress to 30 minutes of  aerobic without signs/symptoms of physical distress Continue with 30 min of aerobic exercise without signs/symptoms of physical distress. Continue with 30 min of aerobic exercise without signs/symptoms of physical distress. Continue with 30 min of aerobic exercise without signs/symptoms of physical distress.   Intensity -- THRR unchanged THRR unchanged THRR unchanged THRR unchanged     Progression   Progression -- Continue to progress workloads to maintain intensity without signs/symptoms of physical distress. Continue to progress workloads to maintain intensity without signs/symptoms of physical distress. Continue to progress workloads to maintain intensity without signs/symptoms of physical distress. Continue to progress workloads to maintain intensity without signs/symptoms of physical distress.   Average METs -- 2.96 3.37 3.37 3.48     Resistance Training   Training Prescription -- Yes Yes Yes Yes   Weight -- 4 5 lb 5 lb 5 lb   Reps -- 10-15 10-15 10-15 10-15     Interval Training   Interval Training -- No No No No     Treadmill   MPH -- 2.4 2.6 2.6 2.4   Grade -- 4 5 5 7    Minutes -- 15 15 15 15    METs -- 4.16 4.78 4.78 5.15     NuStep   Level -- 2  T6 4  T6 nustep 4  T6 nustep 4  T6   Minutes -- 15 15 15 15    METs -- 2 2.5 2.5 2.4     Biostep-RELP   Level -- 2 3 3 4    Minutes -- 15 15 15 15    METs -- 3 2 2 3      Home Exercise Plan   Plans to  continue exercise at -- -- -- [employer] (comment)  [employer] (comment)  [employer]   Frequency -- -- -- Add 2 additional days to program exercise sessions. Add 2 additional days to program exercise sessions.   Initial Home Exercises Provided -- -- -- 03/04/23 03/04/23  Oxygen   Maintain Oxygen Saturation -- 88% or higher 88% or higher 88% or higher 88% or higher    Row Name 03/26/23 0700 04/10/23 1100 04/24/23 1500 05/08/23 1400 06/06/23 1600     Response to Exercise   Blood Pressure (Admit) 114/62 104/64 136/70 122/72 110/60   Blood Pressure (Exercise) 118/68 -- -- -- --   Blood Pressure (Exit) 106/60 96/58 104/60 100/68 110/68   Heart Rate (Admit) 77 bpm 87 bpm 87 bpm 85 bpm 84 bpm   Heart Rate (Exercise) 106 bpm 118 bpm 113 bpm 120 bpm 122 bpm   Heart Rate (Exit) 74 bpm 79 bpm 81 bpm 92 bpm 81 bpm   Rating of Perceived Exertion (Exercise) 13 15 13 15 14    Perceived Dyspnea (Exercise) 0 -- 0 0 0   Symptoms none none none none none   Duration Continue with 30 min of aerobic exercise without signs/symptoms of physical distress. Continue with 30 min of aerobic exercise without signs/symptoms of physical distress. Continue with 30 min of aerobic exercise without signs/symptoms of physical distress. Continue with 30 min of aerobic exercise without signs/symptoms of physical distress. Continue with 30 min of aerobic exercise without signs/symptoms of physical distress.   Intensity THRR unchanged THRR unchanged THRR unchanged THRR unchanged THRR unchanged     Progression   Progression Continue to progress workloads to maintain intensity without signs/symptoms of physical distress. Continue to progress workloads to maintain intensity without signs/symptoms of physical distress. Continue to progress workloads to maintain intensity without signs/symptoms of physical distress. Continue to progress workloads to maintain intensity without signs/symptoms of physical  distress. Continue to progress workloads to maintain intensity without signs/symptoms of physical distress.   Average METs 3.82 3.8 3.45 5 4.48     Resistance Training   Training Prescription Yes Yes Yes Yes Yes   Weight 5 lb 5 lb 5 lb 5 lb 5 lb   Reps 10-15 10-15 10-15 10-15 10-15     Interval Training   Interval Training No No No No No     Treadmill   MPH 2.8 2.8 2.5 3 2.7   Grade 7 6 6.5 7 7    Minutes 15 15 15 15 15    METs 5.84 5.46 5.15 5.67 5.67     NuStep   Level 4  T6 4  T6 nustep 3  T6 5  T6 1   Minutes 15 15 15 15 15    METs 2 2.7 2 3.1 1     T5 Nustep   Level -- -- -- -- 5  T6   Minutes -- -- -- -- 15   METs -- -- -- -- 3     Biostep-RELP   Level -- 3 2 2  --   Minutes -- 15 15 15  --   METs -- 3 2 -- --     Home Exercise Plan   Plans to continue exercise at [employer] (comment)  [employer] (comment)  [employer] (comment)  [employer] (comment)  [employer] (comment)  [employer]   Frequency Add 2 additional days to program exercise sessions. Add 2 additional days to program exercise sessions. Add 2 additional days to program exercise sessions. Add 2 additional days to program exercise sessions. Add 2 additional days to program exercise sessions.   Initial Home Exercises Provided 03/04/23 03/04/23 03/04/23 03/04/23 03/04/23     Oxygen   Maintain Oxygen Saturation 88% or higher 88% or  higher 88% or higher 88% or higher 88% or higher            Exercise Comments:   Exercise Comments     Row Name 02/04/23 1725           Exercise Comments First full day of exercise!  Patient was oriented to gym and equipment including functions, settings, policies, and procedures.  Patient's individual exercise prescription and treatment plan were reviewed.  All starting workloads were established based on the results of the 6 minute walk test done at initial orientation  visit.  The plan for exercise progression was also introduced and progression will be customized based on patient's performance and goals.  .                Exercise Goals and Review:   Exercise Goals     Row Name 01/31/23 1608             Exercise Goals   Increase Physical Activity Yes       Intervention Develop an individualized exercise prescription for aerobic and resistive training based on initial evaluation findings, risk stratification, comorbidities and participant's personal goals.;Provide advice, education, support and counseling about physical activity/exercise needs.       Expected Outcomes Short Term: Attend rehab on a regular basis to increase amount of physical activity.;Long Term: Add in home exercise to make exercise part of routine and to increase amount of physical activity.;Long Term: Exercising regularly at least 3-5 days a week.       Increase Strength and Stamina Yes       Intervention Provide advice, education, support and counseling about physical activity/exercise needs.;Develop an individualized exercise prescription for aerobic and resistive training based on initial evaluation findings, risk stratification, comorbidities and participant's personal goals.       Expected Outcomes Short Term: Perform resistance training exercises routinely during rehab and add in resistance training at home;Short Term: Increase workloads from initial exercise prescription for resistance, speed, and METs.;Long Term: Improve cardiorespiratory fitness, muscular endurance and strength as measured by increased METs and functional capacity ( )       Able to understand and use rate of perceived exertion (RPE) scale Yes       Intervention Provide education and explanation on how to use RPE scale       Expected Outcomes Short Term: Able to use RPE daily in rehab to express subjective intensity level;Long Term:  Able to use RPE to guide intensity level when exercising independently        Able to understand and use Dyspnea scale Yes       Intervention Provide education and explanation on how to use Dyspnea scale       Expected Outcomes Short Term: Able to use Dyspnea scale daily in rehab to express subjective sense of shortness of breath during exertion       Knowledge and understanding of Target Heart Rate Range (THRR) Yes       Intervention Provide education and explanation of THRR including how the numbers were predicted and where they are located for reference       Expected Outcomes Short Term: Able to state/look up THRR;Long Term: Able to use THRR to govern intensity when exercising independently;Short Term: Able to use daily as guideline for intensity in rehab       Able to check pulse independently Yes       Intervention Provide education and demonstration on how to check pulse in carotid and  radial arteries.;Review the importance of being able to check your own pulse for safety during independent exercise       Expected Outcomes Short Term: Able to explain why pulse checking is important during independent exercise;Long Term: Able to check pulse independently and accurately       Understanding of Exercise Prescription Yes       Intervention Provide education, explanation, and written materials on patient's individual exercise prescription       Expected Outcomes Short Term: Able to explain program exercise prescription;Long Term: Able to explain home exercise prescription to exercise independently                Exercise Goals Re-Evaluation :  Exercise Goals Re-Evaluation     Row Name 02/04/23 1726 02/11/23 0946 03/01/23 0727 03/04/23 1751 03/12/23 1446     Exercise Goal Re-Evaluation   Exercise Goals Review Increase Physical Activity;Able to understand and use rate of perceived exertion (RPE) scale;Knowledge and understanding of Target Heart Rate Range (THRR);Understanding of Exercise Prescription;Increase Strength and Stamina;Able to check pulse independently  Increase Physical Activity;Increase Strength and Stamina;Understanding of Exercise Prescription Increase Physical Activity;Increase Strength and Stamina;Understanding of Exercise Prescription Increase Physical Activity;Able to understand and use rate of perceived exertion (RPE) scale;Able to understand and use Dyspnea scale;Knowledge and understanding of Target Heart Rate Range (THRR);Able to check pulse independently;Increase Strength and Stamina;Understanding of Exercise Prescription Increase Physical Activity;Increase Strength and Stamina;Understanding of Exercise Prescription   Comments Reviewed RPE  and dyspnea scale, THR and program prescription with pt today.  Pt voiced understanding and was given a copy of goals to take home. Anneke is off to a good start in the program. She recently attended her first session and continues to become familiar with the program and her exercise prescription. She has increased her treadmill grade from 0% to 4% and has added the Biostep to her current prescription. We will continue to monitor her progress in the program. Persaeus is doing well in the program. She recently increased her treadmill workload to a speed of 2.6 mph with a 5% incline. She also improved to level 4 on the T6 nustep and level 3 on the biostep. She increased from 4 lb to 5 lb hand weights for resistance training as well. We will continue to monitor her progress in the program. Reviewed home exercise with pt today.  Pt plans to walk and go to [employer] for exercise.  Reviewed THR, pulse, RPE, sign and symptoms, pulse oximetery and when to call 911 or MD.  Also discussed weather considerations and indoor options.  Pt voiced understanding. Sayyora continues to do well in rehab. She increased her treadmill workload to a speed of 2.4 mph with an incline of 7%. She also continues to work at level 4 on the T6 nustep and improved to level 6 on the biostep. We will continue to monitor her progress in the  program.   Expected Outcomes Short: Use RPE daily to regulate intensity.  Long: Follow program prescription in THR. Short: Continue to follow current exercise prescription, and progressively increase workloads. Long: Continue exercise to improve strength and stamina. Short: Continue to progressively increase workloads. Long: Continue exercise to improve strength and stamina. Short: add 1-2 days per week of exercise at home on off days of rehab. Long: become independent with exercise routine. Short: Try level 5 on the T6 nustep. Long: Continue to increase overall METs and stamina.    Row Name 03/26/23 [phone] 04/10/23 1123 04/24/23 1505 04/29/23  1731 05/08/23 1421     Exercise Goal Re-Evaluation   Exercise Goals Review Increase Physical Activity;Increase Strength and Stamina;Understanding of Exercise Prescription Increase Physical Activity;Increase Strength and Stamina;Understanding of Exercise Prescription Increase Physical Activity;Increase Strength and Stamina;Understanding of Exercise Prescription Increase Physical Activity;Increase Strength and Stamina;Understanding of Exercise Prescription Increase Physical Activity;Increase Strength and Stamina;Understanding of Exercise Prescription   Comments Elizabella continues to do well in rehab. She has been able to increase her speed on the treadmill from 2.4 to 2.2mph. She also has been able to maintain intensity of level 4 the T6 nustep. We will continue to monitor her progress in the program. Rhiann is doing well in rehab. She has stayed consistent on the treadmill at a speed of 2.8 mph and an incline of 6%. She also continues to work at level 3 on the biostep and level 4 on the T6 nustep. We will continue to monitor her progress in the program. Lanai continues to do well in rehab. She has been able to gradually increase her treadmill workloads, during this review she was able to top out at 2.5 mph and 6.5% incline. She has also maintained a level of 3 on the T6  nustep. We will continue to monitor her progress in the program. Bellina states she is going to go to [employer] when she is done with [facility]. She has been doing well in the program and has been increasing her levels. She is not to far from [employer] and weill try to go four days a week. Tyara continues to make improvements in rehab. She has been able to increase her level on the T6 nustep from level 3 to 5. She was also able to increase both her speed and grade on the treadmill to 3 mph at 7% grade. We will continue to monitor her progress in the program.   Expected Outcomes Short: Try level 5 on the T6 nustep. Long: Continue to increase overall METs and stamina. Short: Try level 5 on the T6 nustep. Long: Continue to increase overall METs and stamina. Short: Continue to increase treadmill workload, increase level on the T6 nustep and biostep. Long: Continue exercise to improve strength and stamina. Short: graduate [employer]. Long: workout routinely independently. Short: Graduate. Long: Continue exercise to improve strength and stamina.    Row Name 05/20/23 1008 06/06/23 1622 06/18/23 1528         Exercise Goal Re-Evaluation   Exercise Goals Review Increase Physical Activity;Increase Strength and Stamina;Understanding of Exercise Prescription Increase Physical Activity;Increase Strength and Stamina;Understanding of Exercise Prescription Increase Physical Activity;Increase Strength and Stamina;Understanding of Exercise Prescription     Comments Chardonay has not attended rehab since 11/13. We will try to reach out to her in order to determine her status. We will continue to monitor her progress in the program upon her return. Levina has returned to rehab and is doing well. She only attended 2 sessions during this review, but was able to return to her former treadmill workloads. She also was able to maintain a level of 5 on the T6 nustep. We will continue to monitor her progress in the program.  Arnesia was not able to attend the program during this review period. We will attempt to reach out in the coming days in order to determine her status.     Expected Outcomes Short: Return to rehab. Long: Continue exercise to improve strength and stamina. Short: Continue to progressively increase treadmill workload. Long: Continue exercise to improve strength and stamina. Short: Retuen  to rehab. Long: Continue exercise to improve strength and stamina.              Discharge Exercise Prescription (Final Exercise Prescription Changes):  Exercise Prescription Changes - 06/06/23 1600       Response to Exercise   Blood Pressure (Admit) 110/60    Blood Pressure (Exit) 110/68    Heart Rate (Admit) 84 bpm    Heart Rate (Exercise) 122 bpm    Heart Rate (Exit) 81 bpm    Rating of Perceived Exertion (Exercise) 14    Perceived Dyspnea (Exercise) 0    Symptoms none    Duration Continue with 30 min of aerobic exercise without signs/symptoms of physical distress.    Intensity THRR unchanged      Progression   Progression Continue to progress workloads to maintain intensity without signs/symptoms of physical distress.    Average METs 4.48      Resistance Training   Training Prescription Yes    Weight 5 lb    Reps 10-15      Interval Training   Interval Training No      Treadmill   MPH 2.7    Grade 7    Minutes 15    METs 5.67      NuStep   Level 1    Minutes 15    METs 1      T5 Nustep   Level 5   T6   Minutes 15    METs 3      Home Exercise Plan   Plans to continue exercise at [employer] (comment)   [employer]   Frequency Add 2 additional days to program exercise sessions.    Initial Home Exercises Provided 03/04/23      Oxygen   Maintain Oxygen Saturation 88% or higher             Nutrition:  Target Goals: Understanding of nutrition guidelines, daily intake of sodium 1500mg , cholesterol 200mg , calories 30% from fat and 7% or less from saturated fats,  daily to have 5 or more servings of fruits and vegetables.  Education: All About Nutrition: -Group instruction provided by verbal, written material, interactive activities, discussions, models, and posters to present general guidelines for heart healthy nutrition including fat, fiber, MyPlate, the role of sodium in heart healthy nutrition, utilization of the nutrition label, and utilization of this knowledge for meal planning. Follow up email sent as well. Written material given at graduation. Flowsheet Row Cardiac Rehab from 02/20/2023 in [facility] Cardiac and Pulmonary Rehab  Education need identified 01/31/23  Date 02/20/23  Educator JG  Instruction Review Code 1- Verbalizes Understanding       Biometrics:  Pre Biometrics - 01/31/23 1609       Pre Biometrics   Height 5' 4.5" (1.638 m)    Weight 162 lb 4.8 oz (73.6 kg)    Waist Circumference 37 inches    Hip Circumference 38 inches    Waist to Hip Ratio 0.97 %    BMI (Calculated) 27.44    Single Leg Stand 25 seconds              Nutrition Therapy Plan and Nutrition Goals:   Nutrition Assessments:  MEDIFICTS Score Key: >=70 Need to make dietary changes  40-70 Heart Healthy Diet <= 40 Therapeutic Level Cholesterol Diet  Flowsheet Row Cardiac Rehab from 01/31/2023 in [facility] Cardiac and Pulmonary Rehab  Picture Your Plate Total Score on Admission 38      Picture  Your Plate Scores: <16 Unhealthy dietary pattern with much room for improvement. 41-50 Dietary pattern unlikely to meet recommendations for good health and room for improvement. 51-60 More healthful dietary pattern, with some room for improvement.  >60 Healthy dietary pattern, although there may be some specific behaviors that could be improved.    Nutrition Goals Re-Evaluation:  Nutrition Goals Re-Evaluation     Row Name 03/04/23 1755 04/29/23 1729           Goals   Nutrition Goal reschedule missed appointment with RD reschedule missed appointment with RD       Comment reschedule missed appointment with RD Patient would still like to meet with the dietitian.      Expected Outcome Short: meet with RD. Long: establish and follow goals set by RD. Short: meet with RD. Long: establish and follow goals set by RD.               Nutrition Goals Discharge (Final Nutrition Goals Re-Evaluation):  Nutrition Goals Re-Evaluation - 04/29/23 1729       Goals   Nutrition Goal reschedule missed appointment with RD    Comment Patient would still like to meet with the dietitian.    Expected Outcome Short: meet with RD. Long: establish and follow goals set by RD.             Psychosocial: Target Goals: Acknowledge presence or absence of significant depression and/or stress, maximize coping skills, provide positive support system. Participant is able to verbalize types and ability to use techniques and skills needed for reducing stress and depression.   Education: Stress, Anxiety, and Depression - Group verbal and visual presentation to define topics covered.  Reviews how body is impacted by stress, anxiety, and depression.  Also discusses healthy ways to reduce stress and to treat/manage anxiety and depression.  Written material given at graduation.   Education: Sleep Hygiene -Provides group verbal and written instruction about how sleep can affect your health.  Define sleep hygiene, discuss sleep cycles and impact of sleep habits. Review good sleep hygiene tips.    Initial Review & Psychosocial Screening:  Initial Psych Review & Screening - 01/07/23 1027       Initial Review   Current issues with None Identified      Family Dynamics   Good Support System? Yes   daughter, son, mama     Barriers   Psychosocial barriers to participate in program There are no identifiable barriers or psychosocial needs.      Screening Interventions   Interventions Encouraged to exercise;To provide support and resources with identified psychosocial  needs;Provide feedback about the scores to participant    Expected Outcomes Short Term goal: Utilizing psychosocial counselor, staff and physician to assist with identification of specific Stressors or current issues interfering with healing process. Setting desired goal for each stressor or current issue identified.;Long Term Goal: Stressors or current issues are controlled or eliminated.;Short Term goal: Identification and review with participant of any Quality of Life or Depression concerns found by scoring the questionnaire.;Long Term goal: The participant improves quality of Life and PHQ9 Scores as seen by post scores and/or verbalization of changes             Quality of Life Scores:   Quality of Life - 01/31/23 1556       Quality of Life   Select Quality of Life      Quality of Life Scores   Health/Function Pre 23.2 %  Socioeconomic Pre 23.63 %    Psych/Spiritual Pre 24.79 %    Family Pre 24 %    GLOBAL Pre 23.73 %            Scores of 19 and below usually indicate a poorer quality of life in these areas.  A difference of  2-3 points is a clinically meaningful difference.  A difference of 2-3 points in the total score of the Quality of Life Index has been associated with significant improvement in overall quality of life, self-image, physical symptoms, and general health in studies assessing change in quality of life.  PHQ-9: Review Flowsheet  More data exists      06/17/2023 03/18/2023 01/31/2023 01/11/2023 12/14/2022  Depression screen PHQ 2/9  Decreased Interest 0 0 1 1 1   Down, Depressed, Hopeless 0 0 0 0 1  PHQ - 2 Score 0 0 1 1 2   Altered sleeping 0 0 1 1 0  Tired, decreased energy 0 0 1 1 0  Change in appetite 0 0 1 0 0  Feeling bad or failure about yourself  0 0 0 1 1  Trouble concentrating 0 0 0 0 0  Moving slowly or fidgety/restless 0 0 0 0 0  Suicidal thoughts 0 0 0 0 0  PHQ-9 Score 0 0 4 4 3   Difficult doing work/chores - Not difficult at all Not  difficult at all Not difficult at all Somewhat difficult   Interpretation of Total Score  Total Score Depression Severity:  1-4 = Minimal depression, 5-9 = Mild depression, 10-14 = Moderate depression, 15-19 = Moderately severe depression, 20-27 = Severe depression   Psychosocial Evaluation and Intervention:  Psychosocial Evaluation - 01/07/23 1033       Psychosocial Evaluation & Interventions   Interventions Encouraged to exercise with the program and follow exercise prescription    Comments Vidette has no barriers to attending the program.  She has a good support system with her daughter, son and her Mother.  She hopes to learn her heart rate limits and how to manage her heart disease so she can return to hiking and visiting waterfalls.    Expected Outcomes STG Evetta Hoehn is able to attend all scheduled sessions and is able to see progression with her exercise. Her goal is to have stamina to get back to hiking and visiting waterfalls. LTG Jazlin has returned to her hikes and visiting warefalls without any cardiac concerns.    Continue Psychosocial Services  Follow up required by staff             Psychosocial Re-Evaluation:  Psychosocial Re-Evaluation     Row Name 03/04/23 1757 04/29/23 1727           Psychosocial Re-Evaluation   Current issues with None Identified Current Anxiety/Panic;Current Depression      Comments Devann has no new sleep, stress, or mental health concerns. Glorious has been taking medication for her depression and anxiety after her surgery. The stress of the surgery and her underlying anxiety led her to need medication. She ahs been dealing with her Uncle who had prostate cancer and was out last week due to driving her mother around.      Expected Outcomes Short: continue to attend cardiac rehab consistently for mental health benefits. Long: maintain good mental health routine. Short: continue medication as needed. Long: maintain a positive outlook on her mental  health.      Interventions Encouraged to attend Cardiac Rehabilitation for the exercise Encouraged to attend Cardiac  Rehabilitation for the exercise      Continue Psychosocial Services  Follow up required by staff Follow up required by staff               Psychosocial Discharge (Final Psychosocial Re-Evaluation):  Psychosocial Re-Evaluation - 04/29/23 1727       Psychosocial Re-Evaluation   Current issues with Current Anxiety/Panic;Current Depression    Comments Haliegh has been taking medication for her depression and anxiety after her surgery. The stress of the surgery and her underlying anxiety led her to need medication. She ahs been dealing with her Uncle who had prostate cancer and was out last week due to driving her mother around.    Expected Outcomes Short: continue medication as needed. Long: maintain a positive outlook on her mental health.    Interventions Encouraged to attend Cardiac Rehabilitation for the exercise    Continue Psychosocial Services  Follow up required by staff             Vocational Rehabilitation: Provide vocational rehab assistance to qualifying candidates.   Vocational Rehab Evaluation & Intervention:  Vocational Rehab - 03/04/23 1756       Initial Vocational Rehab Evaluation & Intervention   Assessment shows need for Vocational Rehabilitation No      Vocational Rehab Re-Evaulation   Comments currently back at work             Education: Education Goals: Education classes will be provided on a variety of topics geared toward better understanding of heart health and risk factor modification. Participant will state understanding/return demonstration of topics presented as noted by education test scores.  Learning Barriers/Preferences:  Learning Barriers/Preferences - 01/07/23 1029       Learning Barriers/Preferences   Learning Barriers None    Learning Preferences None             General Cardiac Education  Topics:  AED/CPR: - Group verbal and written instruction with the use of models to demonstrate the basic use of the AED with the basic ABC's of resuscitation.   Anatomy and Cardiac Procedures: - Group verbal and visual presentation and models provide information about basic cardiac anatomy and function. Reviews the testing methods done to diagnose heart disease and the outcomes of the test results. Describes the treatment choices: Medical Management, Angioplasty, or Coronary Bypass Surgery for treating various heart conditions including Myocardial Infarction, Angina, Valve Disease, and Cardiac Arrhythmias.  Written material given at graduation. Flowsheet Row Cardiac Rehab from 02/20/2023 in [facility] Cardiac and Pulmonary Rehab  Education need identified 01/31/23       Medication Safety: - Group verbal and visual instruction to review commonly prescribed medications for heart and lung disease. Reviews the medication, class of the drug, and side effects. Includes the steps to properly store meds and maintain the prescription regimen.  Written material given at graduation.   Intimacy: - Group verbal instruction through game format to discuss how heart and lung disease can affect sexual intimacy. Written material given at graduation..   Know Your Numbers and Heart Failure: - Group verbal and visual instruction to discuss disease risk factors for cardiac and pulmonary disease and treatment options.  Reviews associated critical values for Overweight/Obesity, Hypertension, Cholesterol, and Diabetes.  Discusses basics of heart failure: signs/symptoms and treatments.  Introduces Heart Failure Zone chart for action plan for heart failure.  Written material given at graduation.   Infection Prevention: - Provides verbal and written material to individual with discussion of infection control including proper  hand washing and proper equipment cleaning during exercise session. Flowsheet Row Cardiac Rehab from  02/20/2023 in [facility] Cardiac and Pulmonary Rehab  Date 01/31/23  Educator NT  Instruction Review Code 1- Verbalizes Understanding       Falls Prevention: - Provides verbal and written material to individual with discussion of falls prevention and safety. Flowsheet Row Cardiac Rehab from 02/20/2023 in [facility] Cardiac and Pulmonary Rehab  Date 01/07/23  Educator SB  Instruction Review Code 1- Verbalizes Understanding       Other: -Provides group and verbal instruction on various topics (see comments)   Knowledge Questionnaire Score:  Knowledge Questionnaire Score - 01/31/23 1556       Knowledge Questionnaire Score   Pre Score 20/26             Core Components/Risk Factors/Patient Goals at Admission:  Personal Goals and Risk Factors at Admission - 01/07/23 1131       Core Components/Risk Factors/Patient Goals on Admission   Number of packs per day Smoking cessation instruction/counseling given:  counseled patient on the dangers of tobacco use, advised patient to stop smoking, and reviewed strategies to maximize success. Danaya is a current tobacco user. Intervention for tobacco cessation was provided at the initial medical review. She was asked about readiness to quit and reported that she is weaning slowly to a Quit date of 06/12/2023 . Patient was advised and educated about tobacco cessation using combination therapy, tobacco cessation classes, quit line, and quit smoking apps. Patient demonstrated understanding of this material. Staff will continue to provide encouragement and follow up with the patient throughout the program.             Education:Diabetes - Individual verbal and written instruction to review signs/symptoms of diabetes, desired ranges of glucose level fasting, after meals and with exercise. Acknowledge that pre and post exercise glucose checks will be done for 3 sessions at entry of program. Flowsheet Row Cardiac Rehab from 02/20/2023 in [facility] Cardiac and  Pulmonary Rehab  Date 01/31/23  Educator NT  Instruction Review Code 1- Verbalizes Understanding       Core Components/Risk Factors/Patient Goals Review:   Goals and Risk Factor Review     Row Name 03/04/23 1758 04/29/23 1724           Core Components/Risk Factors/Patient Goals Review   Personal Goals Review Diabetes;Tobacco Cessation Tobacco Cessation      Review Patient reported that she continues to use nicotine  patch and it is helpful. She has cut back to about 6 cigarettes a day. She also monitors her blood sugars at home and states they are under control. Her las A1C is reported to be around 6.9. Malania is doing well with her smoking habit and is willing to quit. She is still getting up in the morning and craving after she eats. Xara is not taking any medications currently to help her quit smoking. Informed her of certain techniques to use to help her quit.      Expected Outcomes Short: continue to monitor blood sugars at home and maintain A1C below 7%. Continue to cut back on amount of cigarettes.. Long: quit smoking. Short: continue to reduce tobacco use. Long: be tobacco free.               Core Components/Risk Factors/Patient Goals at Discharge (Final Review):   Goals and Risk Factor Review - 04/29/23 1724       Core Components/Risk Factors/Patient Goals Review   Personal Goals Review Tobacco Cessation  Review Seandra is doing well with her smoking habit and is willing to quit. She is still getting up in the morning and craving after she eats. Paislea is not taking any medications currently to help her quit smoking. Informed her of certain techniques to use to help her quit.    Expected Outcomes Short: continue to reduce tobacco use. Long: be tobacco free.             ITP Comments:  ITP Comments     Row Name 01/07/23 1131 01/31/23 1553 02/04/23 1725 02/20/23 1056 03/20/23 1222   ITP Comments irtual orientation call completed today. shehas an appointment on  Date: 01/31/2023  for EP eval and gym Orientation.  Documentation of diagnosis [facility] be found in [facility] 10/19/2022 .   Smoking cessation instruction/counseling given:  counseled patient on the dangers of tobacco use, advised patient to stop smoking, and reviewed strategies to maximize success. Tayjah is a current tobacco user. Intervention for tobacco cessation was provided at the initial medical review. She was asked about readiness to quit and reported that she is weaning slowly to a Quit date of 06/12/2023 . Patient was advised and educated about tobacco cessation using combination therapy, tobacco cessation classes, quit line, and quit smoking apps. Patient demonstrated understanding of this material. Staff will continue to provide encouragement and follow up with the patient throughout the program. Completed and gym orientation. Initial ITP created and sent for review to Dr. Firman Hughes, Medical Director. First full day of exercise!  Patient was oriented to gym and equipment including functions, settings, policies, and procedures.  Patient's individual exercise prescription and treatment plan were reviewed.  All starting workloads were established based on the results of the 6 minute walk test done at initial orientation visit.  The plan for exercise progression was also introduced and progression will be customized based on patient's performance and goals.  . 30 Day review completed. Medical Director ITP review done, changes made as directed, and signed approval by Medical Director.     new to program 30 Day review completed. Medical Director ITP review done, changes made as directed, and signed approval by Medical Director.    Row Name 04/17/23 1311 05/08/23 1011 05/20/23 1454 06/05/23 1120 07/03/23 1440   ITP Comments 30 Day review completed. Medical Director ITP review done, changes made as directed, and signed approval by Medical Director. 30 Day review completed. Medical Director ITP review done,  changes made as directed, and signed approval by Medical Director. Called to check on Akeira. She has not been to rehab since 11/13. She had been out of town and states she plans to return this Wednesday 12/4. Appt desk updated. 30 Day review completed. Medical Director ITP review done, changes made as directed, and signed approval by Medical Director.   only 2 visits this 30 days 30 Day review completed. Medical Director ITP review done, changes made as directed, and signed approval by Medical Director.   only 2 visits this 30 days            Comments: 30 day review

## 2023-07-04 ENCOUNTER — Ambulatory Visit: Payer: 59

## 2023-07-05 ENCOUNTER — Other Ambulatory Visit: Payer: Self-pay | Admitting: Physician Assistant

## 2023-07-05 DIAGNOSIS — J449 Chronic obstructive pulmonary disease, unspecified: Secondary | ICD-10-CM

## 2023-07-05 NOTE — Telephone Encounter (Signed)
Attempted to contact surgeon's office to receive clarification as to how many teeth will be extracted. Their office is closed until Tuesday 1/21 in observation of MLK day. [id number]

## 2023-07-05 NOTE — Telephone Encounter (Signed)
Callback team please contact requesting provider and find out how many teeth will be extracted prior to clearance being granted for upcoming procedure.  Thanks

## 2023-07-05 NOTE — Telephone Encounter (Signed)
Requested Prescriptions  Pending Prescriptions Disp Refills   albuterol (VENTOLIN HFA) 108 (90 Base) MCG/ACT inhaler [Pharmacy Med Name: VENTOLIN HFA 90 MCG INHALER] 18 each 1    Sig: TAKE 2 PUFFS BY MOUTH EVERY 6 HOURS AS NEEDED FOR WHEEZE OR SHORTNESS OF BREATH     Pulmonology:  Beta Agonists 2 Passed - 07/05/2023 11:09 AM      Passed - Last BP in normal range    BP Readings from Last 1 Encounters:  06/17/23 122/68         Passed - Last Heart Rate in normal range    Pulse Readings from Last 1 Encounters:  06/17/23 94         Passed - Valid encounter within last 12 months    Recent Outpatient Visits           2 weeks ago Essential hypertension   [place] [facility] Mecum, Erin E, PA-C   3 months ago Chronic obstructive pulmonary disease, unspecified COPD type (HCC)   [place] Mecum, Erin E, PA-C   5 months ago Major depression, recurrent, chronic [facility])   [place] [facility] Danelle Berry, PA-C   6 months ago Dyslipidemia associated with type 2 diabetes mellitus [facility])   [place] [facility] Alba Cory, MD   8 months ago Type 2 diabetes mellitus with hyperglycemia, without long-term current use of insulin [facility])   [place] [facility] Danelle Berry, PA-C       Future Appointments             In 3 days Charlsie Quest, NP [facility] [place]   In 1 month Margarita Mail, DO [facility]   In 2 months Margarita Mail, DO [facility]

## 2023-07-08 ENCOUNTER — Encounter: Payer: Self-pay | Admitting: Cardiology

## 2023-07-08 ENCOUNTER — Ambulatory Visit: Payer: 59 | Attending: Cardiology | Admitting: Cardiology

## 2023-07-08 ENCOUNTER — Ambulatory Visit: Payer: 59

## 2023-07-08 VITALS — BP 116/68 | HR 79 | Ht 64.0 in | Wt 172.0 lb

## 2023-07-08 DIAGNOSIS — Z951 Presence of aortocoronary bypass graft: Secondary | ICD-10-CM | POA: Diagnosis not present

## 2023-07-08 DIAGNOSIS — Z01810 Encounter for preprocedural cardiovascular examination: Secondary | ICD-10-CM | POA: Diagnosis not present

## 2023-07-08 DIAGNOSIS — I10 Essential (primary) hypertension: Secondary | ICD-10-CM | POA: Diagnosis not present

## 2023-07-08 DIAGNOSIS — F17200 Nicotine dependence, unspecified, uncomplicated: Secondary | ICD-10-CM

## 2023-07-08 DIAGNOSIS — J449 Chronic obstructive pulmonary disease, unspecified: Secondary | ICD-10-CM

## 2023-07-08 DIAGNOSIS — E1169 Type 2 diabetes mellitus with other specified complication: Secondary | ICD-10-CM

## 2023-07-08 DIAGNOSIS — E785 Hyperlipidemia, unspecified: Secondary | ICD-10-CM

## 2023-07-08 DIAGNOSIS — I2510 Atherosclerotic heart disease of native coronary artery without angina pectoris: Secondary | ICD-10-CM | POA: Diagnosis not present

## 2023-07-08 MED ORDER — CARVEDILOL 6.25 MG PO TABS: 6.2500 mg | ORAL_TABLET | Freq: Two times a day (BID) | ORAL | 3 refills | Status: DC

## 2023-07-08 NOTE — Progress Notes (Signed)
Cardiology Office Note:  .   Date:  07/08/2023  ID:  Lauren [name], DOB 1975-12-16, MRN [id number] PCP: Danelle Berry, PA-C  [facility] Providers Cardiologist:  Bryan Lemma, MD    History of Present Illness: .   Lauren [name] is a 48 y.o. female with a past medical history of hypertension, hyperlipidemia, type 2 diabetes, coronary disease status post CABG x 3, COPD, morbid obesity, GERD, who is here today for follow-up.   She was originally seen by her PCP March 2024 with complaints of chest pain and was referred to cardiology and saw Dr. [name].  She noted gradual onset of chest pain described as left parasternal pressure radiating to both arms.  This was happening a few times a week initially with exertion but then progressed at rest as well.  She started noticing when she was walking her dog and thinks while she was watching TV.   She was admitted to [facility] 10/19/2022 and underwent CABG x 3 on 10/22/2022 with SVG-PDA, SVG-OM, LIMA-LAD as well as endoscopic harvest of the right greater saphenous vein.  She tolerated the procedure well was transferred to SICU in stable condition.  Be started on diuretics for volume overload remains in sinus rhythm.  Hospital course was uneventful and she was able to be discharged on 10/26/2022.   She had noted her appetite is less prominent probably related to being on Rybelsus.  Her pressures initially dropped low enough where they actually stopped her ACE inhibitor that had been at 20 mg and it was now just recently restarted to 2.5 mg dosing.  She had been delayed starting cardiac rehab until was cleared by Dr. Cliffton Asters.  She actually just started rehab in early August.  She is definitely enjoying doing the rehab exercising as well as the education classes.  She walks at least 2 miles on the nonrehab days.  Denies any chest pain pressure or dyspnea at rest or exertion.  She was not able to even do minor housework without having angina prior to  her CABG and now is able to do vigorous activity without noting any problems.  No PND, orthopnea or edema.     She was last seen in clinic 02/28/2023 by Dr. [name].  She done well since her CABG.  She did discuss minor chest wall tenderness but happy to be in cardiac rehab now.  Been walking at least 2 miles a day.  She was enjoying cardiac rehab.  Continue to walk.  Working on smoking cessation.  There were no medication changes that were made and no further testing that was ordered at the time.  She returns to clinic today stating overall from a cardiac perspective she has been doing well.  She denies any chest pain, shortness of breath, peripheral edema, lightheaded or dizziness, or palpitations.  She states that she has been compliant with her current medication without any adverse side effects.  She states that she has been to cardiac rehab but is done well and continues to walk several miles each day.  Unfortunately she states with that she has 4 sessions left but she is unsure if she will be able to continue with the last 4 sessions as the cold is impacting her negatively.  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  She has unfortunately continued to smoke smoking approximately 7 cigarettes/day but continues to work on decreasing the amount of her  smoking daily.  Denies any recent hospitalization or visits to the emergency department.  She also is requiring medical clearance to have a filling done to 2 teeth as she was advised by the dentist when she was in the chair to have the procedures done that since she had had surgery that she would likely need antibiotics.  ROS: 10 point review of systems has been reviewed and considered negative with exception was been listed in the HPI  Studies Reviewed: Marland Kitchen   EKG Interpretation Date/Time:  Monday July 08 2023 14:52:44 EST Ventricular Rate:  79 PR Interval:  158 QRS Duration:  84 QT  Interval:  382 QTC Calculation: 438 R Axis:   49  Text Interpretation: Normal sinus rhythm Nonspecific T wave abnormality When compared with ECG of 23-Oct-2022 06:45, Confirmed by Charlsie Quest ([id number]) on 07/08/2023 2:57:35 PM    RLE U/S 11/21/22 IMPRESSION: 1. No right lower extremity DVT. 2. Elongated complex fluid collection in the right calf likely postop seroma or hematoma.    TTE 10/19/22 1. Left ventricular ejection fraction, by estimation, is 55 to 60%. The  left ventricle has normal function. The left ventricle has no regional  wall motion abnormalities. Left ventricular diastolic parameters are  consistent with Grade I diastolic  dysfunction (impaired relaxation). The average left ventricular global  longitudinal strain is -15.6 %. The global longitudinal strain is  abnormal.   2. Right ventricular systolic function is normal. The right ventricular  size is normal.   3. The mitral valve is normal in structure. Trivial mitral valve  regurgitation. No evidence of mitral stenosis.   4. The aortic valve is tricuspid. Aortic valve regurgitation is not  visualized. No aortic stenosis is present.   5. The inferior vena cava is normal in size with greater than 50%  respiratory variability, suggesting right atrial pressure of 3 mmHg.     LHC 10/19/22   Mid LM to Ost LAD lesion is 95% stenosed with 95% stenosed side branch in Ost Cx.   Mid RCA lesion is 99% stenosed.   RPAV lesion is 90% stenosed.   The left ventricular systolic function is normal.   LV end diastolic pressure is normal.   The left ventricular ejection fraction is 50-55% by visual estimate.   POST-OPERATIVE DIAGNOSIS:   Low normal EF roughly 50 to 55%.  Difficult to assess wall motion due to ectopy.  Will check 2D echo. Severe multivessel CAD: Distal left main 90% heavily calcified eccentric. Mid LAD eccentric 30%, otherwise the LAD has several small diagonal branches and tapers into a relatively small caliber  vessel that reaches the apex. The LCx courses essentially has a lateral OM branch with several small branches that bifurcates distally.  Inferior branch has a 95% stenosis Mid RCA focal eccentric 95% subtotal occlusion; RCA bifurcates into RPA V and PDA There is a 90% stenosis at the RPA V-RPL 1 bifurcation  PLAN OF CARE: Admit to inpatient  -> will initiate low-dose IV nitroglycerin infusion as well as start IV heparin 2 hours after TR band removal based on distal left main disease.  CVTS consulted-will hope for urgent CABG -> will help for Monday, but potentially Saturday or Sunday if she destabilizes. Will titrate GDMT over the weekend. Sliding scale insulin Smoking cessation counseling Risk Assessment/Calculations:             Physical Exam:   VS:  BP 116/68   Pulse 79   Ht 5\' 4"  (1.626 m)   Wt  172 lb (78 kg)   SpO2 98%   BMI 29.52 kg/m    Wt Readings from Last 3 Encounters:  07/08/23 172 lb (78 kg)  06/17/23 169 lb (76.7 kg)  03/18/23 162 lb 14.4 oz (73.9 kg)    GEN: Well nourished, well developed in no acute distress NECK: No JVD; No carotid bruits CARDIAC: RRR, no murmurs, rubs, gallops RESPIRATORY:  Clear to auscultation without rales, wheezing or rhonchi  ABDOMEN: Soft, non-tender, non-distended EXTREMITIES:  No edema; No deformity   ASSESSMENT AND PLAN: .   Coronary artery disease status post CABG x 3 on 10/22/2022 for LIMA-LAD, SVG to PDA, SVG to OM.  Patient denies any current chest discomfort today.  EKG today reveals sinus rhythm with a rate of 79 with no significant changes.  She has been participating cardiac rehab which is unsure if she will be able to continue with the last 4 visits due to the weather.  She continues to walk approximately 2 miles per day.  She is continue aspirin 81 mg daily and rosuvastatin 40 mg daily.  Hypertension with blood pressure today 116/68.  Blood pressure continues to remain stable.  She is continued on lisinopril 2.5 mg daily and  carvedilol 6.25 mg twice daily.  She has been encouraged to continue to monitor her blood pressures 1 to 2 hours postmedication administration.  Hyperlipidemia associated with type 2 diabetes with her most recent LDL of 77.  Goal is less than 70 and ideally 55.  She had met goal approximately 6 months prior.  States that she has had dietary changes over the holidays.  She has been continued on rosuvastatin 40 mg daily.  She is currently continued on Farxiga along with Rybelsus for diabetes management continues to be maintained by her PCP.  COPD that is chronic with occasional morning cough likely related to smoking.  She continues to smoke approximately 7 cigarettes/day.    Tobacco abuse where she continues to smoke daily with total cessation being recommended.  Preoperative cardiovascular exam for patient is at acceptable risk to have fillings completed without the need to hold any medications or required antibiotic prophylaxis.       Dispo: Patient to return to clinic to see MD/APP in 6 months or sooner if needed   Signed, Zaila Crew, NP

## 2023-07-08 NOTE — Telephone Encounter (Signed)
Pt has appt today 07/08/23 with Charlsie Quest, NP. I will send her the clearance notes we have at this time as the [facility] is closed until 07/09/23.   Pt may be able to give some insight as to how many teeth are being extracted.

## 2023-07-08 NOTE — Patient Instructions (Signed)
Medication Instructions:  No changes at this time.   *If you need a refill on your cardiac medications before your next appointment, please call your pharmacy*   Lab Work: None  If you have labs (blood work) drawn today and your tests are completely normal, you will receive your results only by: MyChart Message (if you have MyChart) OR A paper copy in the mail If you have any lab test that is abnormal or we need to change your treatment, we will call you to review the results.   Testing/Procedures: None   Follow-Up: At [facility], you and your health needs are our priority.  As part of our continuing mission to provide you with exceptional heart care, we have created designated Provider Care Teams.  These Care Teams include your primary Cardiologist (physician) and Advanced Practice Providers (APPs -  Physician Assistants and Nurse Practitioners) who all work together to provide you with the care you need, when you need it.   Your next appointment:   6 month(s)  Provider:   Bryan Lemma, MD or Charlsie Quest, NP

## 2023-07-10 ENCOUNTER — Ambulatory Visit: Payer: 59

## 2023-07-11 ENCOUNTER — Ambulatory Visit: Payer: 59

## 2023-07-11 DIAGNOSIS — E119 Type 2 diabetes mellitus without complications: Secondary | ICD-10-CM | POA: Diagnosis not present

## 2023-07-11 DIAGNOSIS — H5213 Myopia, bilateral: Secondary | ICD-10-CM | POA: Diagnosis not present

## 2023-07-15 ENCOUNTER — Ambulatory Visit: Payer: 59

## 2023-07-15 ENCOUNTER — Encounter: Payer: Self-pay | Admitting: *Deleted

## 2023-07-15 DIAGNOSIS — Z951 Presence of aortocoronary bypass graft: Secondary | ICD-10-CM

## 2023-07-15 NOTE — Progress Notes (Signed)
Discharge Summary   Tauri Ethington DOB: Jan 24, 1976  Early discharge due to lack of attendance related to weather and family obligations. She completed 28/36 sessions    6 Minute Walk     Row Name 01/31/23 1607         6 Minute Walk   Phase Initial     Distance 1165 feet     Walk Time 6 minutes     # of Rest Breaks 0     MPH 2.21     METS 3.74     RPE 9     Perceived Dyspnea  0     VO2 Peak 13.11     Symptoms No     Resting HR 74 bpm     Resting BP 116/68     Resting Oxygen Saturation  98 %     Exercise Oxygen Saturation  during 6 min walk 99 %     Max Ex. HR 93 bpm     Max Ex. BP 122/64     2 Minute Post BP 110/64

## 2023-07-15 NOTE — Progress Notes (Signed)
Cardiac Individual Treatment Plan  Patient Details  Name: Lauren [name] MRN: [id number] Date of Birth: [birth year] Referring Provider:   Flowsheet Row Cardiac Rehab from 01/31/2023 in [facility] Cardiac and Pulmonary Rehab  Referring Provider Dr. [name]       Initial Encounter Date:  Flowsheet Row Cardiac Rehab from 01/31/2023 in [facility] Cardiac and Pulmonary Rehab  Date 01/31/23       Visit Diagnosis: S/P CABG x 3  Patient's Home Medications on Admission:  Current Outpatient Medications:    acetaminophen (TYLENOL) 500 MG tablet, Take 1 tablet (500 mg total) by mouth every 6 (six) hours as needed., Disp: 30 tablet, Rfl: 0   albuterol (VENTOLIN HFA) 108 (90 Base) MCG/ACT inhaler, TAKE 2 PUFFS BY MOUTH EVERY 6 HOURS AS NEEDED FOR WHEEZE OR SHORTNESS OF BREATH, Disp: 18 each, Rfl: 1   aspirin EC 81 MG tablet, Take 1 tablet (81 mg total) by mouth daily. Swallow whole., Disp: 90 tablet, Rfl: 3   b complex vitamins capsule, Take 1 capsule by mouth daily., Disp: , Rfl:    carvedilol (COREG) 6.25 MG tablet, Take 1 tablet (6.25 mg total) by mouth 2 (two) times daily., Disp: 180 tablet, Rfl: 3   cetirizine (ZYRTEC) 10 MG tablet, Take 10 mg by mouth daily as needed for allergies., Disp: , Rfl:    dapagliflozin propanediol (FARXIGA) 10 MG TABS tablet, Take 1 tablet (10 mg total) by mouth daily., Disp: 90 tablet, Rfl: 1   DULoxetine (CYMBALTA) 60 MG capsule, Take 1 capsule (60 mg total) by mouth 2 (two) times daily., Disp: 180 capsule, Rfl: 1   fluticasone (FLONASE) 50 MCG/ACT nasal spray, Place 1 spray into both nostrils daily as needed for allergies., Disp: , Rfl:    lisinopril (ZESTRIL) 2.5 MG tablet, Take 1 tablet (2.5 mg total) by mouth daily., Disp: 90 tablet, Rfl: 3   nicotine (NICODERM CQ) 7 mg/24hr patch, Place 1 patch (7 mg total) onto the skin daily., Disp: 21 patch, Rfl: 0   nitroGLYCERIN (NITROSTAT) 0.3 MG SL tablet, Place 1 tablet (0.3 mg total) under the tongue every 5 (five) minutes as  needed for chest pain., Disp: 90 tablet, Rfl: 12   rosuvastatin (CRESTOR) 40 MG tablet, Take 1 tablet (40 mg total) by mouth daily., Disp: 90 tablet, Rfl: 1   Semaglutide (RYBELSUS) 7 MG TABS, Take 1 tablet (7 mg total) by mouth daily., Disp: 90 tablet, Rfl: 1   traZODone (DESYREL) 100 MG tablet, Take 1 tablet (100 mg total) by mouth at bedtime., Disp: 90 tablet, Rfl: 1   valACYclovir (VALTREX) 1000 MG tablet, TAKE 1 TABLET (1,000 MG TOTAL) BY MOUTH DAILY. TAKE AS NEEDED FOR HERPES OUTBREAK., Disp: 30 tablet, Rfl: 11   Vitamin D, Ergocalciferol, (DRISDOL) 1.25 MG (50000 UNIT) CAPS capsule, Take 1 capsule (50,000 Units total) by mouth every 7 (seven) days., Disp: 12 capsule, Rfl: 0  Past Medical History: Past Medical History:  Diagnosis Date   Allergic rhinitis    Anxiety    Chronic depression    Coronary artery disease involving native coronary artery without angina pectoris 10/19/2022   Cardiac cath 10/19/2022 with distal left main and ostial LAD/LCx disease as well as 95% RCA disease. => Referred for CABG x 3 LIMA-LAD, SVG-PDA, SVG-OM (10/22/2022)     Elevated LFTs    Essential hypertension 05/19/2018   Genital herpes    GERD (gastroesophageal reflux disease)    Hyperlipidemia    S/P CABG x 3: LIMA-LAD, SVG-PDA, SVG-OM 10/22/2022  Smoker 05/26/2014   Type 2 diabetes mellitus with hyperglycemia, without long-term current use of insulin (HCC) 08/18/2021   Vitamin D deficiency     Tobacco Use: Social History   Tobacco Use  Smoking Status Some Days   Current packs/day: 0.50   Average packs/day: 0.7 packs/day for 63.1 years (45.5 ttl pk-yrs)   Types: Cigarettes   Start date: 1990  Smokeless Tobacco Never  Tobacco Comments   Working down number of cigarettes a day  Hope to have cessation by Christmas 06/12/2023. 8-9 cigarettes a day.     Labs: Review Flowsheet  More data exists      Latest Ref Rng & Units 10/22/2022 10/23/2022 12/14/2022 03/18/2023 06/17/2023  Labs for ITP Cardiac and  Pulmonary Rehab  Cholestrol <200 mg/dL - - 161  - 096   LDL (calc) mg/dL (calc) - - 46  - 77   HDL-C > OR = 50 mg/dL - - 40  - 49   Trlycerides <150 mg/dL - - 045  - 409   Hemoglobin A1c <5.7 % of total Hgb - - 6.8  6.0  7.2   PH, Arterial 7.35 - 7.45 7.322  7.340  7.271  7.328  7.301  7.396  7.302  7.387  - - -  PCO2 arterial 32 - 48 mmHg 34.0  36.7  35.5  43.4  44.2  38.2  48.1  36.4  - - -  Bicarbonate 20.0 - 28.0 mmol/L 17.5  19.7  16.4  22.9  21.8  24.6  23.4  23.7  21.8  - - -  TCO2 22 - 32 mmol/L 18  21  17  24  25  23  25  26  25  26  26  25  23   - - -  Acid-base deficit 0.0 - 2.0 mmol/L 8.0  5.0  10.0  3.0  4.0  1.0  1.0  3.0  3.0  - - -  O2 Saturation % 95  97  98  95  100  76  100  100  96  - - -    Details       Multiple values from one day are sorted in reverse-chronological order          Exercise Target Goals: Exercise Program Goal: Individual exercise prescription set using results from initial 6 min walk test and THRR while considering  patient's activity barriers and safety.   Exercise Prescription Goal: Initial exercise prescription builds to 30-45 minutes a day of aerobic activity, 2-3 days per week.  Home exercise guidelines will be given to patient during program as part of exercise prescription that the participant will acknowledge.   Education: Aerobic Exercise: - Group verbal and visual presentation on the components of exercise prescription. Introduces F.I.T.T principle from ACSM for exercise prescriptions.  Reviews F.I.T.T. principles of aerobic exercise including progression. Written material given at graduation. Flowsheet Row Cardiac Rehab from 02/20/2023 in [facility] Cardiac and Pulmonary Rehab  Education need identified 01/31/23       Education: Resistance Exercise: - Group verbal and visual presentation on the components of exercise prescription. Introduces F.I.T.T principle from ACSM for exercise prescriptions  Reviews F.I.T.T. principles of  resistance exercise including progression. Written material given at graduation. Flowsheet Row Cardiac Rehab from 02/20/2023 in [facility] Cardiac and Pulmonary Rehab  Date 02/06/23  Educator [facility]  Instruction Review Code 1- [employer] Understanding        Education: Exercise & Equipment Safety: - Individual verbal instruction and  demonstration of equipment use and safety with use of the equipment. Flowsheet Row Cardiac Rehab from 02/20/2023 in [facility] Cardiac and Pulmonary Rehab  Date 01/31/23  Educator NT  Instruction Review Code 1- Verbalizes Understanding       Education: Exercise Physiology & General Exercise Guidelines: - Group verbal and written instruction with models to review the exercise physiology of the cardiovascular system and associated critical values. Provides general exercise guidelines with specific guidelines to those with heart or lung disease.    Education: Flexibility, Balance, Mind/Body Relaxation: - Group verbal and visual presentation with interactive activity on the components of exercise prescription. Introduces F.I.T.T principle from ACSM for exercise prescriptions. Reviews F.I.T.T. principles of flexibility and balance exercise training including progression. Also discusses the mind body connection.  Reviews various relaxation techniques to help reduce and manage stress (i.e. Deep breathing, progressive muscle relaxation, and visualization). Balance handout provided to take home. Written material given at graduation. Flowsheet Row Cardiac Rehab from 02/20/2023 in [facility] Cardiac and Pulmonary Rehab  Date 02/06/23  Educator [facility]  Instruction Review Code 1- Verbalizes Understanding       Activity Barriers & Risk Stratification:  Activity Barriers & Cardiac Risk Stratification - 01/31/23 1608       Activity Barriers & Cardiac Risk Stratification   Activity Barriers Joint Problems;Other (comment)    Comments R hip pain    Cardiac Risk Stratification High              6 Minute Walk:  6 Minute Walk     Row Name 01/31/23 1607         6 Minute Walk   Phase Initial     Distance 1165 feet     Walk Time 6 minutes     # of Rest Breaks 0     MPH 2.21     METS 3.74     RPE 9     Perceived Dyspnea  0     VO2 Peak 13.11     Symptoms No     Resting HR 74 bpm     Resting BP 116/68     Resting Oxygen Saturation  98 %     Exercise Oxygen Saturation  during 6 min walk 99 %     Max Ex. HR 93 bpm     Max Ex. BP 122/64     2 Minute Post BP 110/64              Oxygen Initial Assessment:   Oxygen Re-Evaluation:   Oxygen Discharge (Final Oxygen Re-Evaluation):   Initial Exercise Prescription:  Initial Exercise Prescription - 01/31/23 1600       Date of Initial Exercise RX and Referring Provider   Date 01/31/23    Referring Provider Dr. [name]      Oxygen   Maintain Oxygen Saturation 88% or higher      Treadmill   MPH 2.2    Grade 0    Minutes 15    METs 2.68      NuStep   Level 2    SPM 80    Minutes 15    METs 3.74      T5 Nustep   Level 2   T6 nustep   SPM 80    Minutes 15    METs 3.74      Prescription Details   Frequency (times per week) 3    Duration Progress to 30 minutes of continuous aerobic without signs/symptoms of physical distress  Intensity   THRR 40-80% of Max Heartrate 113-153    Ratings of Perceived Exertion 11-13    Perceived Dyspnea 0-4      Progression   Progression Continue to progress workloads to maintain intensity without signs/symptoms of physical distress.      Resistance Training   Training Prescription Yes    Weight 4 lb    Reps 10-15             Perform Capillary Blood Glucose checks as needed.  Exercise Prescription Changes:   Exercise Prescription Changes     Row Name 01/31/23 1600 02/11/23 0900 03/01/23 0700 03/04/23 1700 03/12/23 1400     Response to Exercise   Blood Pressure (Admit) 116/68 110/58 98/60 -- 122/80   Blood Pressure (Exercise) 122/64 150/70  142/82 -- 134/60   Blood Pressure (Exit) 110/64 94/60 96/60  -- 120/60   Heart Rate (Admit) 74 bpm 78 bpm 91 bpm -- 87 bpm   Heart Rate (Exercise) 93 bpm 110 bpm 125 bpm -- 120 bpm   Heart Rate (Exit) 75 bpm 67 bpm 89 bpm -- 79 bpm   Oxygen Saturation (Admit) 98 % -- -- -- --   Oxygen Saturation (Exercise) 99 % -- -- -- --   Rating of Perceived Exertion (Exercise) 9 14 14  -- 13   Perceived Dyspnea (Exercise) 0 -- -- -- --   Symptoms None none none -- none   Comments Results -- -- -- --   Duration -- Progress to 30 minutes of  aerobic without signs/symptoms of physical distress Continue with 30 min of aerobic exercise without signs/symptoms of physical distress. Continue with 30 min of aerobic exercise without signs/symptoms of physical distress. Continue with 30 min of aerobic exercise without signs/symptoms of physical distress.   Intensity -- THRR unchanged THRR unchanged THRR unchanged THRR unchanged     Progression   Progression -- Continue to progress workloads to maintain intensity without signs/symptoms of physical distress. Continue to progress workloads to maintain intensity without signs/symptoms of physical distress. Continue to progress workloads to maintain intensity without signs/symptoms of physical distress. Continue to progress workloads to maintain intensity without signs/symptoms of physical distress.   Average METs -- 2.96 3.37 3.37 3.48     Resistance Training   Training Prescription -- Yes Yes Yes Yes   Weight -- 4 5 lb 5 lb 5 lb   Reps -- 10-15 10-15 10-15 10-15     Interval Training   Interval Training -- No No No No     Treadmill   MPH -- 2.4 2.6 2.6 2.4   Grade -- 4 5 5 7    Minutes -- 15 15 15 15    METs -- 4.16 4.78 4.78 5.15     NuStep   Level -- 2  T6 4  T6 nustep 4  T6 nustep 4  T6   Minutes -- 15 15 15 15    METs -- 2 2.5 2.5 2.4     Biostep-RELP   Level -- 2 3 3 4    Minutes -- 15 15 15 15    METs -- 3 2 2 3      Home Exercise Plan   Plans to  continue exercise at -- -- -- [employer] (comment)  [employer] (comment)  [employer]   Frequency -- -- -- Add 2 additional days to program exercise sessions. Add 2 additional days to program exercise sessions.   Initial Home Exercises Provided -- -- -- 03/04/23 03/04/23  Oxygen   Maintain Oxygen Saturation -- 88% or higher 88% or higher 88% or higher 88% or higher    Row Name 03/26/23 0700 04/10/23 1100 04/24/23 1500 05/08/23 1400 06/06/23 1600     Response to Exercise   Blood Pressure (Admit) 114/62 104/64 136/70 122/72 110/60   Blood Pressure (Exercise) 118/68 -- -- -- --   Blood Pressure (Exit) 106/60 96/58 104/60 100/68 110/68   Heart Rate (Admit) 77 bpm 87 bpm 87 bpm 85 bpm 84 bpm   Heart Rate (Exercise) 106 bpm 118 bpm 113 bpm 120 bpm 122 bpm   Heart Rate (Exit) 74 bpm 79 bpm 81 bpm 92 bpm 81 bpm   Rating of Perceived Exertion (Exercise) 13 15 13 15 14    Perceived Dyspnea (Exercise) 0 -- 0 0 0   Symptoms none none none none none   Duration Continue with 30 min of aerobic exercise without signs/symptoms of physical distress. Continue with 30 min of aerobic exercise without signs/symptoms of physical distress. Continue with 30 min of aerobic exercise without signs/symptoms of physical distress. Continue with 30 min of aerobic exercise without signs/symptoms of physical distress. Continue with 30 min of aerobic exercise without signs/symptoms of physical distress.   Intensity THRR unchanged THRR unchanged THRR unchanged THRR unchanged THRR unchanged     Progression   Progression Continue to progress workloads to maintain intensity without signs/symptoms of physical distress. Continue to progress workloads to maintain intensity without signs/symptoms of physical distress. Continue to progress workloads to maintain intensity without signs/symptoms of physical distress. Continue to progress workloads to maintain intensity without signs/symptoms of physical  distress. Continue to progress workloads to maintain intensity without signs/symptoms of physical distress.   Average METs 3.82 3.8 3.45 5 4.48     Resistance Training   Training Prescription Yes Yes Yes Yes Yes   Weight 5 lb 5 lb 5 lb 5 lb 5 lb   Reps 10-15 10-15 10-15 10-15 10-15     Interval Training   Interval Training No No No No No     Treadmill   MPH 2.8 2.8 2.5 3 2.7   Grade 7 6 6.5 7 7    Minutes 15 15 15 15 15    METs 5.84 5.46 5.15 5.67 5.67     NuStep   Level 4  T6 4  T6 nustep 3  T6 5  T6 1   Minutes 15 15 15 15 15    METs 2 2.7 2 3.1 1     T5 Nustep   Level -- -- -- -- 5  T6   Minutes -- -- -- -- 15   METs -- -- -- -- 3     Biostep-RELP   Level -- 3 2 2  --   Minutes -- 15 15 15  --   METs -- 3 2 -- --     Home Exercise Plan   Plans to continue exercise at [employer] (comment)  [employer] (comment)  [employer] (comment)  [employer] (comment)  [employer] (comment)  [employer]   Frequency Add 2 additional days to program exercise sessions. Add 2 additional days to program exercise sessions. Add 2 additional days to program exercise sessions. Add 2 additional days to program exercise sessions. Add 2 additional days to program exercise sessions.   Initial Home Exercises Provided 03/04/23 03/04/23 03/04/23 03/04/23 03/04/23     Oxygen   Maintain Oxygen Saturation 88% or higher 88% or  higher 88% or higher 88% or higher 88% or higher            Exercise Comments:   Exercise Comments     Row Name 02/04/23 1725           Exercise Comments First full day of exercise!  Patient was oriented to gym and equipment including functions, settings, policies, and procedures.  Patient's individual exercise prescription and treatment plan were reviewed.  All starting workloads were established based on the results of the 6 minute walk test done at initial orientation  visit.  The plan for exercise progression was also introduced and progression will be customized based on patient's performance and goals.  .                Exercise Goals and Review:   Exercise Goals     Row Name 01/31/23 1608             Exercise Goals   Increase Physical Activity Yes       Intervention Develop an individualized exercise prescription for aerobic and resistive training based on initial evaluation findings, risk stratification, comorbidities and participant's personal goals.;Provide advice, education, support and counseling about physical activity/exercise needs.       Expected Outcomes Short Term: Attend rehab on a regular basis to increase amount of physical activity.;Long Term: Add in home exercise to make exercise part of routine and to increase amount of physical activity.;Long Term: Exercising regularly at least 3-5 days a week.       Increase Strength and Stamina Yes       Intervention Provide advice, education, support and counseling about physical activity/exercise needs.;Develop an individualized exercise prescription for aerobic and resistive training based on initial evaluation findings, risk stratification, comorbidities and participant's personal goals.       Expected Outcomes Short Term: Perform resistance training exercises routinely during rehab and add in resistance training at home;Short Term: Increase workloads from initial exercise prescription for resistance, speed, and METs.;Long Term: Improve cardiorespiratory fitness, muscular endurance and strength as measured by increased METs and functional capacity ( )       Able to understand and use rate of perceived exertion (RPE) scale Yes       Intervention Provide education and explanation on how to use RPE scale       Expected Outcomes Short Term: Able to use RPE daily in rehab to express subjective intensity level;Long Term:  Able to use RPE to guide intensity level when exercising independently        Able to understand and use Dyspnea scale Yes       Intervention Provide education and explanation on how to use Dyspnea scale       Expected Outcomes Short Term: Able to use Dyspnea scale daily in rehab to express subjective sense of shortness of breath during exertion       Knowledge and understanding of Target Heart Rate Range (THRR) Yes       Intervention Provide education and explanation of THRR including how the numbers were predicted and where they are located for reference       Expected Outcomes Short Term: Able to state/look up THRR;Long Term: Able to use THRR to govern intensity when exercising independently;Short Term: Able to use daily as guideline for intensity in rehab       Able to check pulse independently Yes       Intervention Provide education and demonstration on how to check pulse in carotid and  radial arteries.;Review the importance of being able to check your own pulse for safety during independent exercise       Expected Outcomes Short Term: Able to explain why pulse checking is important during independent exercise;Long Term: Able to check pulse independently and accurately       Understanding of Exercise Prescription Yes       Intervention Provide education, explanation, and written materials on patient's individual exercise prescription       Expected Outcomes Short Term: Able to explain program exercise prescription;Long Term: Able to explain home exercise prescription to exercise independently                Exercise Goals Re-Evaluation :  Exercise Goals Re-Evaluation     Row Name 02/04/23 1726 02/11/23 0946 03/01/23 0727 03/04/23 1751 03/12/23 1446     Exercise Goal Re-Evaluation   Exercise Goals Review Increase Physical Activity;Able to understand and use rate of perceived exertion (RPE) scale;Knowledge and understanding of Target Heart Rate Range (THRR);Understanding of Exercise Prescription;Increase Strength and Stamina;Able to check pulse independently  Increase Physical Activity;Increase Strength and Stamina;Understanding of Exercise Prescription Increase Physical Activity;Increase Strength and Stamina;Understanding of Exercise Prescription Increase Physical Activity;Able to understand and use rate of perceived exertion (RPE) scale;Able to understand and use Dyspnea scale;Knowledge and understanding of Target Heart Rate Range (THRR);Able to check pulse independently;Increase Strength and Stamina;Understanding of Exercise Prescription Increase Physical Activity;Increase Strength and Stamina;Understanding of Exercise Prescription   Comments Reviewed RPE  and dyspnea scale, THR and program prescription with pt today.  Pt voiced understanding and was given a copy of goals to take home. Lauren [name] is off to a good start in the program. She recently attended her first session and continues to become familiar with the program and her exercise prescription. She has increased her treadmill grade from 0% to 4% and has added the Biostep to her current prescription. We will continue to monitor her progress in the program. Lauren [name] is doing well in the program. She recently increased her treadmill workload to a speed of 2.6 mph with a 5% incline. She also improved to level 4 on the T6 nustep and level 3 on the biostep. She increased from 4 lb to 5 lb hand weights for resistance training as well. We will continue to monitor her progress in the program. Reviewed home exercise with pt today.  Pt plans to walk and go to [employer] for exercise.  Reviewed THR, pulse, RPE, sign and symptoms, pulse oximetery and when to call 911 or MD.  Also discussed weather considerations and indoor options.  Pt voiced understanding. Lauren [name] continues to do well in rehab. She increased her treadmill workload to a speed of 2.4 mph with an incline of 7%. She also continues to work at level 4 on the T6 nustep and improved to level 6 on the biostep. We will continue to monitor her progress in the  program.   Expected Outcomes Short: Use RPE daily to regulate intensity.  Long: Follow program prescription in THR. Short: Continue to follow current exercise prescription, and progressively increase workloads. Long: Continue exercise to improve strength and stamina. Short: Continue to progressively increase workloads. Long: Continue exercise to improve strength and stamina. Short: add 1-2 days per week of exercise at home on off days of rehab. Long: become independent with exercise routine. Short: Try level 5 on the T6 nustep. Long: Continue to increase overall METs and stamina.    Row Name 03/26/23 [phone] 04/10/23 1123 04/24/23 1505 04/29/23  1731 05/08/23 1421     Exercise Goal Re-Evaluation   Exercise Goals Review Increase Physical Activity;Increase Strength and Stamina;Understanding of Exercise Prescription Increase Physical Activity;Increase Strength and Stamina;Understanding of Exercise Prescription Increase Physical Activity;Increase Strength and Stamina;Understanding of Exercise Prescription Increase Physical Activity;Increase Strength and Stamina;Understanding of Exercise Prescription Increase Physical Activity;Increase Strength and Stamina;Understanding of Exercise Prescription   Comments Lauren [name] continues to do well in rehab. She has been able to increase her speed on the treadmill from 2.4 to 2.65mph. She also has been able to maintain intensity of level 4 the T6 nustep. We will continue to monitor her progress in the program. Lauren [name] is doing well in rehab. She has stayed consistent on the treadmill at a speed of 2.8 mph and an incline of 6%. She also continues to work at level 3 on the biostep and level 4 on the T6 nustep. We will continue to monitor her progress in the program. Lauren [name] continues to do well in rehab. She has been able to gradually increase her treadmill workloads, during this review she was able to top out at 2.5 mph and 6.5% incline. She has also maintained a level of 3 on the T6  nustep. We will continue to monitor her progress in the program. Lauren [name] states she is going to go to [employer] when she is done with [facility]. She has been doing well in the program and has been increasing her levels. She is not to far from [employer] and weill try to go four days a week. Lauren [name] continues to make improvements in rehab. She has been able to increase her level on the T6 nustep from level 3 to 5. She was also able to increase both her speed and grade on the treadmill to 3 mph at 7% grade. We will continue to monitor her progress in the program.   Expected Outcomes Short: Try level 5 on the T6 nustep. Long: Continue to increase overall METs and stamina. Short: Try level 5 on the T6 nustep. Long: Continue to increase overall METs and stamina. Short: Continue to increase treadmill workload, increase level on the T6 nustep and biostep. Long: Continue exercise to improve strength and stamina. Short: graduate [facility]. Long: workout routinely independently. Short: Graduate. Long: Continue exercise to improve strength and stamina.    Row Name 05/20/23 1008 06/06/23 1622 06/18/23 1528 07/03/23 1629       Exercise Goal Re-Evaluation   Exercise Goals Review Increase Physical Activity;Increase Strength and Stamina;Understanding of Exercise Prescription Increase Physical Activity;Increase Strength and Stamina;Understanding of Exercise Prescription Increase Physical Activity;Increase Strength and Stamina;Understanding of Exercise Prescription Increase Physical Activity;Increase Strength and Stamina;Understanding of Exercise Prescription    Comments Lauren [name] has not attended rehab since 11/13. We will try to reach out to her in order to determine her status. We will continue to monitor her progress in the program upon her return. Lauren [name] has returned to rehab and is doing well. She only attended 2 sessions during this review, but was able to return to her former treadmill workloads. She also was  able to maintain a level of 5 on the T6 nustep. We will continue to monitor her progress in the program. Lauren [name] was not able to attend the program during this review period. We will attempt to reach out in the coming days in order to determine her status. Lauren [name] was not able to attend the program during this review period. We will attempt to reach out in the coming days in order to determine her  status.    Expected Outcomes Short: Return to rehab. Long: Continue exercise to improve strength and stamina. Short: Continue to progressively increase treadmill workload. Long: Continue exercise to improve strength and stamina. Short: Retuen to rehab. Long: Continue exercise to improve strength and stamina. Short: Retuen to rehab. Long: Continue exercise to improve strength and stamina.             Discharge Exercise Prescription (Final Exercise Prescription Changes):  Exercise Prescription Changes - 06/06/23 1600       Response to Exercise   Blood Pressure (Admit) 110/60    Blood Pressure (Exit) 110/68    Heart Rate (Admit) 84 bpm    Heart Rate (Exercise) 122 bpm    Heart Rate (Exit) 81 bpm    Rating of Perceived Exertion (Exercise) 14    Perceived Dyspnea (Exercise) 0    Symptoms none    Duration Continue with 30 min of aerobic exercise without signs/symptoms of physical distress.    Intensity THRR unchanged      Progression   Progression Continue to progress workloads to maintain intensity without signs/symptoms of physical distress.    Average METs 4.48      Resistance Training   Training Prescription Yes    Weight 5 lb    Reps 10-15      Interval Training   Interval Training No      Treadmill   MPH 2.7    Grade 7    Minutes 15    METs 5.67      NuStep   Level 1    Minutes 15    METs 1      T5 Nustep   Level 5   T6   Minutes 15    METs 3      Home Exercise Plan   Plans to continue exercise at [employer] (comment)   [employer]   Frequency Add 2 additional  days to program exercise sessions.    Initial Home Exercises Provided 03/04/23      Oxygen   Maintain Oxygen Saturation 88% or higher             Nutrition:  Target Goals: Understanding of nutrition guidelines, daily intake of sodium 1500mg , cholesterol 200mg , calories 30% from fat and 7% or less from saturated fats, daily to have 5 or more servings of fruits and vegetables.  Education: All About Nutrition: -Group instruction provided by verbal, written material, interactive activities, discussions, models, and posters to present general guidelines for heart healthy nutrition including fat, fiber, MyPlate, the role of sodium in heart healthy nutrition, utilization of the nutrition label, and utilization of this knowledge for meal planning. Follow up email sent as well. Written material given at graduation. Flowsheet Row Cardiac Rehab from 02/20/2023 in [facility] Cardiac and Pulmonary Rehab  Education need identified 01/31/23  Date 02/20/23  Educator JG  Instruction Review Code 1- Verbalizes Understanding       Biometrics:  Pre Biometrics - 01/31/23 1609       Pre Biometrics   Height 5' 4.5" (1.638 m)    Weight 162 lb 4.8 oz (73.6 kg)    Waist Circumference 37 inches    Hip Circumference 38 inches    Waist to Hip Ratio 0.97 %    BMI (Calculated) 27.44    Single Leg Stand 25 seconds              Nutrition Therapy Plan and Nutrition Goals:   Nutrition Assessments:  MEDIFICTS Score  Key: >=70 Need to make dietary changes  40-70 Heart Healthy Diet <= 40 Therapeutic Level Cholesterol Diet  Flowsheet Row Cardiac Rehab from 01/31/2023 in [facility] Cardiac and Pulmonary Rehab  Picture Your Plate Total Score on Admission 38      Picture Your Plate Scores: <40 Unhealthy dietary pattern with much room for improvement. 41-50 Dietary pattern unlikely to meet recommendations for good health and room for improvement. 51-60 More healthful dietary pattern, with some room for  improvement.  >60 Healthy dietary pattern, although there may be some specific behaviors that could be improved.    Nutrition Goals Re-Evaluation:  Nutrition Goals Re-Evaluation     Row Name 03/04/23 1755 04/29/23 1729           Goals   Nutrition Goal reschedule missed appointment with RD reschedule missed appointment with RD      Comment reschedule missed appointment with RD Patient would still like to meet with the dietitian.      Expected Outcome Short: meet with RD. Long: establish and follow goals set by RD. Short: meet with RD. Long: establish and follow goals set by RD.               Nutrition Goals Discharge (Final Nutrition Goals Re-Evaluation):  Nutrition Goals Re-Evaluation - 04/29/23 1729       Goals   Nutrition Goal reschedule missed appointment with RD    Comment Patient would still like to meet with the dietitian.    Expected Outcome Short: meet with RD. Long: establish and follow goals set by RD.             Psychosocial: Target Goals: Acknowledge presence or absence of significant depression and/or stress, maximize coping skills, provide positive support system. Participant is able to verbalize types and ability to use techniques and skills needed for reducing stress and depression.   Education: Stress, Anxiety, and Depression - Group verbal and visual presentation to define topics covered.  Reviews how body is impacted by stress, anxiety, and depression.  Also discusses healthy ways to reduce stress and to treat/manage anxiety and depression.  Written material given at graduation.   Education: Sleep Hygiene -Provides group verbal and written instruction about how sleep can affect your health.  Define sleep hygiene, discuss sleep cycles and impact of sleep habits. Review good sleep hygiene tips.    Initial Review & Psychosocial Screening:   Quality of Life Scores:   Quality of Life - 01/31/23 1556       Quality of Life   Select Quality of Life       Quality of Life Scores   Health/Function Pre 23.2 %    Socioeconomic Pre 23.63 %    Psych/Spiritual Pre 24.79 %    Family Pre 24 %    GLOBAL Pre 23.73 %            Scores of 19 and below usually indicate a poorer quality of life in these areas.  A difference of  2-3 points is a clinically meaningful difference.  A difference of 2-3 points in the total score of the Quality of Life Index has been associated with significant improvement in overall quality of life, self-image, physical symptoms, and general health in studies assessing change in quality of life.  PHQ-9: Review Flowsheet  More data exists      06/17/2023 03/18/2023 01/31/2023 01/11/2023 12/14/2022  Depression screen PHQ 2/9  Decreased Interest 0 0 1 1 1   Down, Depressed, Hopeless 0 0 0  0 1  PHQ - 2 Score 0 0 1 1 2   Altered sleeping 0 0 1 1 0  Tired, decreased energy 0 0 1 1 0  Change in appetite 0 0 1 0 0  Feeling bad or failure about yourself  0 0 0 1 1  Trouble concentrating 0 0 0 0 0  Moving slowly or fidgety/restless 0 0 0 0 0  Suicidal thoughts 0 0 0 0 0  PHQ-9 Score 0 0 4 4 3   Difficult doing work/chores - Not difficult at all Not difficult at all Not difficult at all Somewhat difficult   Interpretation of Total Score  Total Score Depression Severity:  1-4 = Minimal depression, 5-9 = Mild depression, 10-14 = Moderate depression, 15-19 = Moderately severe depression, 20-27 = Severe depression   Psychosocial Evaluation and Intervention:   Psychosocial Re-Evaluation:  Psychosocial Re-Evaluation     Row Name 03/04/23 1757 04/29/23 1727           Psychosocial Re-Evaluation   Current issues with None Identified Current Anxiety/Panic;Current Depression      Comments Lauren [name] has no new sleep, stress, or mental health concerns. 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 The stress of the surgery and her underlying anxiety led her to need medication. She ahs been  dealing with her Uncle who had prostate cancer and was out last week due to driving her mother around.      Expected Outcomes Short: continue to attend cardiac rehab consistently for mental health benefits. Long: maintain good mental health routine. Short: continue medication as needed. Long: maintain a positive outlook on her mental health.      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 The stress of the surgery and her underlying anxiety led her to need medication. She ahs been dealing with her Uncle who had prostate cancer and was out last week due to driving her mother around.    Expected Outcomes Short: continue medication as needed. Long: maintain a positive outlook on her mental health.    Interventions Encouraged to attend Cardiac Rehabilitation for the exercise    Continue Psychosocial Services  Follow up required by staff             Vocational Rehabilitation: Provide vocational rehab assistance to qualifying candidates.   Vocational Rehab Evaluation & Intervention:  Vocational Rehab - 03/04/23 1756       Initial Vocational Rehab Evaluation & Intervention   Assessment shows need for Vocational Rehabilitation No      Vocational Rehab Re-Evaulation   Comments currently back at work             Education: Education Goals: Education classes will be provided on a variety of topics geared toward better understanding of heart health and risk factor modification. Participant will  state understanding/return demonstration of topics presented as noted by education test scores.  Learning Barriers/Preferences:   General Cardiac Education Topics:  AED/CPR: - Group verbal and  written instruction with the use of models to demonstrate the basic use of the AED with the basic ABC's of resuscitation.   Anatomy and Cardiac Procedures: - Group verbal and visual presentation and models provide information about basic cardiac anatomy and function. Reviews the testing methods done to diagnose heart disease and the outcomes of the test results. Describes the treatment choices: Medical Management, Angioplasty, or Coronary Bypass Surgery for treating various heart conditions including Myocardial Infarction, Angina, Valve Disease, and Cardiac Arrhythmias.  Written material given at graduation. Flowsheet Row Cardiac Rehab from 02/20/2023 in [facility] Cardiac and Pulmonary Rehab  Education need identified 01/31/23       Medication Safety: - Group verbal and visual instruction to review commonly prescribed medications for heart and lung disease. Reviews the medication, class of the drug, and side effects. Includes the steps to properly store meds and maintain the prescription regimen.  Written material given at graduation.   Intimacy: - Group verbal instruction through game format to discuss how heart and lung disease can affect sexual intimacy. Written material given at graduation..   Know Your Numbers and Heart Failure: - Group verbal and visual instruction to discuss disease risk factors for cardiac and pulmonary disease and treatment options.  Reviews associated critical values for Overweight/Obesity, Hypertension, Cholesterol, and Diabetes.  Discusses basics of heart failure: signs/symptoms and treatments.  Introduces Heart Failure Zone chart for action plan for heart failure.  Written material given at graduation.   Infection Prevention: - Provides verbal and written material to  individual with discussion of infection control including proper hand washing and proper equipment cleaning during exercise session. Flowsheet Row Cardiac Rehab from 02/20/2023 in [facility] Cardiac and Pulmonary Rehab  Date 01/31/23  Educator NT  Instruction Review Code 1- Verbalizes Understanding       Falls Prevention: - Provides verbal and written material to individual with discussion of falls prevention and safety. Flowsheet Row Cardiac Rehab from 02/20/2023 in [facility] Cardiac and Pulmonary Rehab  Date 01/07/23  Educator SB  Instruction Review Code 1- Verbalizes Understanding       Other: -Provides group and verbal instruction on various topics (see comments)   Knowledge Questionnaire Score:  Knowledge Questionnaire Score - 01/31/23 1556       Knowledge Questionnaire Score   Pre Score 20/26             Core Components/Risk Factors/Patient Goals at Admission:   Education:Diabetes - Individual verbal and written instruction to review signs/symptoms of diabetes, desired ranges of glucose level fasting, after meals and with exercise. Acknowledge that pre and post exercise glucose checks will be done for 3 sessions at entry of program. Flowsheet Row Cardiac Rehab from 02/20/2023 in [facility] Cardiac and Pulmonary Rehab  Date 01/31/23  Educator NT  Instruction Review Code 1- Verbalizes Understanding       Core Components/Risk Factors/Patient Goals Review:   Goals and Risk Factor Review     Row Name 03/04/23 1758 04/29/23 1724           Core Components/Risk Factors/Patient Goals Review   Personal Goals Review Diabetes;Tobacco Cessation Tobacco Cessation      Review Patient reported that she continues to use nicotine patch and it is helpful. She has cut back to about 6 cigarettes a day. She also monitors her blood sugars at home and states they are under control. Her las A1C is reported to be around 6.9. Lauren [name] is doing well with her smoking habit and is willing to quit. She is  still getting up in the morning and craving after she eats. Lauren [name] is not taking any medications currently to help her quit smoking. Informed her of certain techniques to use to help her quit.      Expected Outcomes Short: continue to monitor blood sugars at home and maintain A1C below 7%. Continue to cut back on amount of cigarettes.. Long: quit smoking. Short: continue to reduce tobacco use. Long: be tobacco free.               Core Components/Risk Factors/Patient Goals at Discharge (Final Review):   Goals and Risk Factor Review - 04/29/23 1724       Core Components/Risk Factors/Patient Goals Review   Personal Goals Review Tobacco Cessation    Review Lauren [name] is doing well with her smoking habit and is willing to quit. She is still getting up in the morning and craving after she eats. Lauren [name] is not taking any medications currently to help her quit smoking. Informed her of certain techniques to use to help her quit.    Expected Outcomes Short: continue to reduce tobacco use. Long: be tobacco free.             ITP Comments:  ITP Comments     Row Name 01/31/23 1553 02/04/23 1725 02/20/23 1056 03/20/23 1222 04/17/23 1311   ITP Comments Completed and gym orientation. Initial ITP created and sent for review to Dr. Bethann Punches, Medical Director. First full day of exercise!  Patient was oriented to gym and equipment including functions, settings, policies, and procedures.  Patient's individual exercise prescription and treatment plan were reviewed.  All starting workloads were established based on the results of the 6 minute walk test done at initial orientation visit.  The plan for exercise progression was also introduced and progression will be customized based on patient's performance and goals.  . 30 Day review completed. Medical Director ITP review done, changes made as directed, and signed approval by Medical Director.     new to program 30 Day review completed. Medical Director ITP  review done, changes made as directed, and signed approval by Medical Director. 30 Day review completed. Medical Director ITP review done, changes made as directed, and signed approval by Medical Director.    Row Name 05/08/23 1011 05/20/23 1454 06/05/23 1120 07/03/23 1440 07/15/23 1604   ITP Comments 30 Day review completed. Medical Director ITP review done, changes made as directed, and signed approval by Medical Director. Called to check on Lauren [name]. She has not been to rehab since 11/13. She had been out of town and states she plans to return this Wednesday 12/4. Appt desk updated. 30 Day review completed. Medical Director ITP review done, changes made as directed, and signed approval by Medical Director.   only 2 visits this 30 days 30 Day review completed. Medical Director ITP review done, changes made as directed, and signed approval by Medical Director.   only 2 visits this 30 days Patient is being discharged at this time due to lack of attendance related to weather and family obligations. She has completed 28/36 sessions.            Comments: Early Discharge ITP

## 2023-07-20 DIAGNOSIS — Z419 Encounter for procedure for purposes other than remedying health state, unspecified: Secondary | ICD-10-CM | POA: Diagnosis not present

## 2023-08-17 DIAGNOSIS — Z419 Encounter for procedure for purposes other than remedying health state, unspecified: Secondary | ICD-10-CM | POA: Diagnosis not present

## 2023-09-02 ENCOUNTER — Ambulatory Visit (INDEPENDENT_AMBULATORY_CARE_PROVIDER_SITE_OTHER): Payer: 59 | Admitting: Internal Medicine

## 2023-09-02 ENCOUNTER — Other Ambulatory Visit (HOSPITAL_COMMUNITY)
Admission: RE | Admit: 2023-09-02 | Discharge: 2023-09-02 | Disposition: A | Discharge status: Home / Self Care | Source: Ambulatory Visit | Attending: Internal Medicine | Admitting: Internal Medicine

## 2023-09-02 ENCOUNTER — Encounter: Payer: Self-pay | Admitting: Internal Medicine

## 2023-09-02 ENCOUNTER — Other Ambulatory Visit: Payer: Self-pay

## 2023-09-02 VITALS — BP 120/76 | HR 76 | Temp 98.0°F | Resp 16 | Ht 64.0 in | Wt 171.2 lb

## 2023-09-02 DIAGNOSIS — Z0000 Encounter for general adult medical examination without abnormal findings: Secondary | ICD-10-CM | POA: Insufficient documentation

## 2023-09-02 DIAGNOSIS — Z124 Encounter for screening for malignant neoplasm of cervix: Secondary | ICD-10-CM

## 2023-09-02 NOTE — Progress Notes (Signed)
 Name: Lauren [name]   MRN: [id number]    DOB: [birth year]   Date:09/02/2023       Progress Note  Subjective  Chief Complaint  Chief Complaint  Patient presents with   Annual Exam    HPI  Patient presents for annual CPE.  Diet: Regular Exercise: 3 days a week 40 minutes - walking on the treadmill Last Eye Exam: completed Last Dental Exam: completed  Flowsheet Row Office Visit from 09/02/2023 in [facility]  AUDIT-C Score 0      Depression: Phq 9 is  negative    09/02/2023    1:53 PM 06/17/2023   11:21 AM 03/18/2023    2:26 PM 01/31/2023    4:01 PM 01/11/2023    8:59 AM  Depression screen PHQ 2/9  Decreased Interest 0 0 0 1 1  Down, Depressed, Hopeless 0 0 0 0 0  PHQ - 2 Score 0 0 0 1 1  Altered sleeping  0 0 1 1  Tired, decreased energy  0 0 1 1  Change in appetite  0 0 1 0  Feeling bad or failure about yourself   0 0 0 1  Trouble concentrating  0 0 0 0  Moving slowly or fidgety/restless  0 0 0 0  Suicidal thoughts  0 0 0 0  PHQ-9 Score  0 0 4 4  Difficult doing work/chores   Not difficult at all Not difficult at all Not difficult at all   Hypertension: BP Readings from Last 3 Encounters:  09/02/23 120/76  07/08/23 116/68  06/17/23 122/68   Obesity: Wt Readings from Last 3 Encounters:  09/02/23 171 lb 3.2 oz (77.7 kg)  07/08/23 172 lb (78 kg)  06/17/23 169 lb (76.7 kg)   BMI Readings from Last 3 Encounters:  09/02/23 29.39 kg/m  07/08/23 29.52 kg/m  06/17/23 29.01 kg/m     Vaccines: UTD reviewed with the patient.   Hep C Screening: completed STD testing and prevention (HIV/chl/gon/syphilis): no concerns  Intimate partner violence: negative screen  LMP: last year, had some spotting after heart surgery but nothing since Discussed importance of follow up if any post-menopausal bleeding: yes Incontinence Symptoms: negative for symptoms   Breast cancer:  - Last Mammogram: 01/31/2023  Osteoporosis Prevention : Discussed  high calcium and vitamin D supplementation, weight bearing exercises Bone density :not applicable   Cervical cancer screening: performing today  Skin cancer: Discussed monitoring for atypical lesions  Colorectal cancer: 07/27/2020  colonoscopy, repeat in 5 years Lung cancer:  Low Dose CT Chest recommended if Age 54-80 years, 20 pack-year currently smoking OR have quit w/in 15years. Patient does not qualify for screen   ECG: 07/08/23  Advanced Care Planning: A voluntary discussion about advance care planning including the explanation and discussion of advance directives.  Discussed health care proxy and Living will, and the patient was able to identify a health care proxy as June Stewart (mother).  Patient does not have a living will and power of attorney of health care   Patient Active Problem List   Diagnosis Date Noted   Insomnia due to mental disorder 06/17/2023   S/P CABG x 3: LIMA-LAD, SVG-PDA, SVG-OM 10/22/2022   Coronary artery disease involving native coronary artery without angina pectoris 10/19/2022   Chronic obstructive pulmonary disease, unspecified COPD type (HCC) 10/15/2022   Type 2 diabetes mellitus with hyperglycemia, without long-term current use of insulin (HCC) 08/18/2021   Polyp of transverse colon    Polyp  of sigmoid colon    Essential hypertension 05/19/2018   Tobacco dependence 05/18/2018   Allergic rhinitis, seasonal 04/23/2017   Genital herpes 04/23/2017   Major depression, recurrent, chronic (HCC) 05/17/2016   Cholesteatoma of attic of right ear 12/05/2015   GERD (gastroesophageal reflux disease) 11/29/2015   Vitamin D deficiency 12/13/2014   Anxiety and depression 12/13/2014   Hyperlipidemia associated with type 2 diabetes mellitus (HCC) 05/26/2014   Smoker 05/26/2014    Past Surgical History:  Procedure Laterality Date   APPENDECTOMY     CESAREAN SECTION     x 4    COLONOSCOPY     COLONOSCOPY WITH PROPOFOL N/A 07/27/2020   Procedure: COLONOSCOPY  WITH PROPOFOL;  Surgeon: Pasty Spillers, MD;  Location: [facility] ENDOSCOPY;  Service: Endoscopy;  Laterality: N/A;  COVID POSITIVE 07/05/2020   CORONARY ARTERY BYPASS GRAFT N/A 10/22/2022   Procedure: CORONARY ARTERY BYPASS GRAFTING (CABG) TIMES THREE USING THE LEFT INTERNAL MAMMARY ARTERY (LIMA) AND ENDOSCOPICALLY HARVESTED RIGHT GREATER SAPHENOUS VEIN;  Surgeon: Corliss Skains, MD;  Location: [facility] OR;  Service: Open Heart Surgery; x 3: LIMA-LAD, SVG-OM, SVG-PDA.   HERNIA REPAIR  09/25/2011   LEFT HEART CATH AND CORONARY ANGIOGRAPHY N/A 10/19/2022   Procedure: LEFT HEART CATH AND CORONARY ANGIOGRAPHY;  Surgeon: Marykay Lex, MD;  Location: [facility] INVASIVE CV LAB;  Service: Cardiovascular; EF 50 to 55%. dLM 90% -< 95% ost LAD & LCx. 99% mRCA & 90% rPAV. => CABG.   TEE WITHOUT CARDIOVERSION N/A 10/22/2022   Procedure: TRANSESOPHAGEAL ECHOCARDIOGRAM;  Surgeon: Corliss Skains, MD;  Location: [facility] OR;  Service: Open Heart Surgery;  Laterality: N/A;   TRANSTHORACIC ECHOCARDIOGRAM  10/19/2022   EF 55 to 60%.  No RWMA.  GR 1 DD.  Normal RV size and function.  Normal AoV, MV.  Normal RAP.   TUBAL LIGATION     TYMPANOMASTOIDECTOMY Right 12/07/2015   Procedure: TYMPANOMASTOIDECTOMY;  Surgeon: Geanie Logan, MD;  Location: [facility] ORS;  Service: ENT;  Laterality: Right;    Family History  Problem Relation Age of Onset   Hypertension Mother    Hyperlipidemia Mother    Diabetes Mother    Arrhythmia Father 34       A-fib   Alcohol abuse Father    Breast cancer Maternal Aunt    Heart disease Brother 48       stent x 1    Alcoholism Brother    Asthma Son    Asperger's syndrome Son     Social History   Socioeconomic History   Marital status: Single    Spouse name: Not on file   Number of children: 4   Years of education: Not on file   Highest education level: GED or equivalent  Occupational History    Employer: [employer]  Tobacco Use   Smoking status: Some Days    Current packs/day: 0.50     Average packs/day: 0.7 packs/day for 63.2 years (45.6 ttl pk-yrs)    Types: Cigarettes    Start date: 1990   Smokeless tobacco: Never   Tobacco comments:    Working down number of cigarettes a day  Hope to have cessation by Christmas 06/12/2023. 8-9 cigarettes a day.   Vaping Use   Vaping status: Never Used  Substance and Sexual Activity   Alcohol use: Not Currently    Alcohol/week: 0.0 standard drinks of alcohol   Drug use: No   Sexual activity: Yes    Partners: Male  Other Topics Concern  Not on file  Social History Narrative   Not on file   Social Drivers of Health   Financial Resource Strain: Low Risk  (09/02/2023)   Overall Financial Resource Strain (CARDIA)    Difficulty of Paying Living Expenses: Not very hard  Food Insecurity: No Food Insecurity (09/02/2023)   Hunger Vital Sign    Worried About Running Out of Food in the Last Year: Never true    Ran Out of Food in the Last Year: Never true  Transportation Needs: No Transportation Needs (09/02/2023)   PRAPARE - Administrator, Civil Service (Medical): No    Lack of Transportation (Non-Medical): No  Physical Activity: Insufficiently Active (09/02/2023)   Exercise Vital Sign    Days of Exercise per Week: 2 days    Minutes of Exercise per Session: 40 min  Stress: No Stress Concern Present (09/02/2023)   [employer] of Occupational Health - Occupational Stress Questionnaire    Feeling of Stress : Only a little  Social Connections: Socially Isolated (09/02/2023)   Social Connection and Isolation Panel [NHANES]    Frequency of Communication with Friends and Family: More than three times a week    Frequency of Social Gatherings with Friends and Family: More than three times a week    Attends Religious Services: Never    Database administrator or Organizations: No    Attends Engineer, structural: Not on file    Marital Status: Never married  Intimate Partner Violence: Not At Risk (09/02/2023)    Humiliation, Afraid, Rape, and Kick questionnaire    Fear of Current or Ex-Partner: No    Emotionally Abused: No    Physically Abused: No    Sexually Abused: No     Current Outpatient Medications:    acetaminophen (TYLENOL) 500 MG tablet, Take 1 tablet (500 mg total) by mouth every 6 (six) hours as needed., Disp: 30 tablet, Rfl: 0   albuterol (VENTOLIN HFA) 108 (90 Base) MCG/ACT inhaler, TAKE 2 PUFFS BY MOUTH EVERY 6 HOURS AS NEEDED FOR WHEEZE OR SHORTNESS OF BREATH, Disp: 18 each, Rfl: 1   aspirin EC 81 MG tablet, Take 1 tablet (81 mg total) by mouth daily. Swallow whole., Disp: 90 tablet, Rfl: 3   b complex vitamins capsule, Take 1 capsule by mouth daily., Disp: , Rfl:    carvedilol (COREG) 6.25 MG tablet, Take 1 tablet (6.25 mg total) by mouth 2 (two) times daily., Disp: 180 tablet, Rfl: 3   cetirizine (ZYRTEC) 10 MG tablet, Take 10 mg by mouth daily as needed for allergies., Disp: , Rfl:    dapagliflozin propanediol (FARXIGA) 10 MG TABS tablet, Take 1 tablet (10 mg total) by mouth daily., Disp: 90 tablet, Rfl: 1   DULoxetine (CYMBALTA) 60 MG capsule, Take 1 capsule (60 mg total) by mouth 2 (two) times daily., Disp: 180 capsule, Rfl: 1   fluticasone (FLONASE) 50 MCG/ACT nasal spray, Place 1 spray into both nostrils daily as needed for allergies., Disp: , Rfl:    lisinopril (ZESTRIL) 2.5 MG tablet, Take 1 tablet (2.5 mg total) by mouth daily., Disp: 90 tablet, Rfl: 3   nitroGLYCERIN (NITROSTAT) 0.3 MG SL tablet, Place 1 tablet (0.3 mg total) under the tongue every 5 (five) minutes as needed for chest pain., Disp: 90 tablet, Rfl: 12   rosuvastatin (CRESTOR) 40 MG tablet, Take 1 tablet (40 mg total) by mouth daily., Disp: 90 tablet, Rfl: 1   Semaglutide (RYBELSUS) 7 MG TABS, Take 1 tablet (  7 mg total) by mouth daily., Disp: 90 tablet, Rfl: 1   traZODone (DESYREL) 100 MG tablet, Take 1 tablet (100 mg total) by mouth at bedtime., Disp: 90 tablet, Rfl: 1   valACYclovir (VALTREX) 1000 MG tablet,  TAKE 1 TABLET (1,000 MG TOTAL) BY MOUTH DAILY. TAKE AS NEEDED FOR HERPES OUTBREAK., Disp: 30 tablet, Rfl: 11   Vitamin D, Ergocalciferol, (DRISDOL) 1.25 MG (50000 UNIT) CAPS capsule, Take 1 capsule (50,000 Units total) by mouth every 7 (seven) days., Disp: 12 capsule, Rfl: 0   nicotine (NICODERM CQ) 7 mg/24hr patch, Place 1 patch (7 mg total) onto the skin daily. (Patient not taking: Reported on 09/02/2023), Disp: 21 patch, Rfl: 0  Allergies  Allergen Reactions   Cephalexin Other (See Comments)    Secondary yeast infection   Metformin And Related Nausea And Vomiting    With all doses and regular and extended release forms     Review of Systems  All other systems reviewed and are negative.   Objective  Vitals:   09/02/23 1356  BP: 120/76  Pulse: 76  Resp: 16  Temp: 98 F (36.7 C)  TempSrc: Oral  SpO2: 98%  Weight: 171 lb 3.2 oz (77.7 kg)  Height: 5\' 4"  (1.626 m)    Body mass index is 29.39 kg/m.  Physical Exam Exam conducted with a chaperone present.  Constitutional:      Appearance: Normal appearance.  HENT:     Head: Normocephalic and atraumatic.  Eyes:     Conjunctiva/sclera: Conjunctivae normal.  Cardiovascular:     Rate and Rhythm: Normal rate and regular rhythm.  Pulmonary:     Effort: Pulmonary effort is normal.     Breath sounds: Normal breath sounds.  Chest:  Breasts:    Right: Normal.     Left: Normal.  Genitourinary:    Comments: External genitalia within normal limits.  Vaginal mucosa pink, moist, normal rugae.  Nonfriable cervix without lesions, no discharge or bleeding noted on speculum exam.  Bimanual exam revealed normal, nongravid uterus.  No cervical motion tenderness. No adnexal masses bilaterally.    Lymphadenopathy:     Upper Body:     Right upper body: No supraclavicular, axillary or pectoral adenopathy.     Left upper body: No supraclavicular, axillary or pectoral adenopathy.  Skin:    General: Skin is warm and dry.  Neurological:      General: No focal deficit present.     Mental Status: She is alert. Mental status is at baseline.  Psychiatric:        Mood and Affect: Mood normal.        Behavior: Behavior normal.     Last CBC Lab Results  Component Value Date   WBC 7.3 03/18/2023   HGB 15.4 03/18/2023   HCT 47.2 (H) 03/18/2023   MCV 93.7 03/18/2023   MCH 30.6 03/18/2023   RDW 14.7 03/18/2023   PLT 179 03/18/2023   Last metabolic panel Lab Results  Component Value Date   GLUCOSE 109 (H) 03/18/2023   NA 141 03/18/2023   K 3.9 03/18/2023   CL 106 03/18/2023   CO2 26 03/18/2023   BUN 9 03/18/2023   CREATININE 0.70 03/18/2023   EGFR 107 03/18/2023   CALCIUM 9.5 03/18/2023   PROT 6.9 03/18/2023   ALBUMIN 4.6 02/08/2018   LABGLOB 2.5 09/27/2015   AGRATIO 1.7 09/27/2015   BILITOT 0.3 03/18/2023   ALKPHOS 67 02/08/2018   AST 17 03/18/2023   ALT 20 03/18/2023  ANIONGAP 7 11/13/2022   Last lipids Lab Results  Component Value Date   CHOL 146 06/17/2023   HDL 49 (L) 06/17/2023   LDLCALC 77 06/17/2023   TRIG 112 06/17/2023   CHOLHDL 3.0 06/17/2023   Last hemoglobin A1c Lab Results  Component Value Date   HGBA1C 7.2 (H) 06/17/2023   Last thyroid functions Lab Results  Component Value Date   TSH 1.21 10/18/2016   Last vitamin D Lab Results  Component Value Date   VD25OH 15 (L) 06/17/2023   Last vitamin B12 and Folate No results found for: "VITAMINB12", "FOLATE"    Assessment & Plan  1. Annual physical exam (Primary)/Cervical cancer screening: Physical exam completed, health maintenance reviewed and Pap performed.   - Cytology - PAP   -USPSTF grade A and B recommendations reviewed with patient; age-appropriate recommendations, preventive care, screening tests, etc discussed and encouraged; healthy living encouraged; see AVS for patient education given to patient -Discussed importance of 150 minutes of physical activity weekly, eat two servings of fish weekly, eat one serving of tree  nuts ( cashews, pistachios, pecans, almonds.Marland Kitchen) every other day, eat 6 servings of fruit/vegetables daily and drink plenty of water and avoid sweet beverages.   -Reviewed Health Maintenance: Yes.

## 2023-09-04 LAB — CYTOLOGY - PAP
Comment: NEGATIVE
Diagnosis: NEGATIVE
High risk HPV: NEGATIVE

## 2023-09-13 ENCOUNTER — Ambulatory Visit: Payer: 59 | Admitting: Internal Medicine

## 2023-09-28 DIAGNOSIS — Z419 Encounter for procedure for purposes other than remedying health state, unspecified: Secondary | ICD-10-CM | POA: Diagnosis not present

## 2023-09-30 ENCOUNTER — Ambulatory Visit (INDEPENDENT_AMBULATORY_CARE_PROVIDER_SITE_OTHER): Admitting: Family Medicine

## 2023-09-30 ENCOUNTER — Encounter: Payer: Self-pay | Admitting: Family Medicine

## 2023-09-30 VITALS — BP 122/70 | HR 73 | Resp 16 | Ht 64.0 in | Wt 176.0 lb

## 2023-09-30 DIAGNOSIS — E1165 Type 2 diabetes mellitus with hyperglycemia: Secondary | ICD-10-CM

## 2023-09-30 DIAGNOSIS — Z8619 Personal history of other infectious and parasitic diseases: Secondary | ICD-10-CM

## 2023-09-30 DIAGNOSIS — I10 Essential (primary) hypertension: Secondary | ICD-10-CM | POA: Diagnosis not present

## 2023-09-30 DIAGNOSIS — E559 Vitamin D deficiency, unspecified: Secondary | ICD-10-CM | POA: Diagnosis not present

## 2023-09-30 DIAGNOSIS — F419 Anxiety disorder, unspecified: Secondary | ICD-10-CM | POA: Diagnosis not present

## 2023-09-30 DIAGNOSIS — F32A Depression, unspecified: Secondary | ICD-10-CM

## 2023-09-30 DIAGNOSIS — Z7984 Long term (current) use of oral hypoglycemic drugs: Secondary | ICD-10-CM

## 2023-09-30 MED ORDER — VALACYCLOVIR HCL 1 G PO TABS: 1000.0000 mg | ORAL_TABLET | Freq: Every day | ORAL | 11 refills | Status: DC

## 2023-09-30 NOTE — Progress Notes (Unsigned)
 Name: Lauren [name]   MRN: [id number]    DOB: Mar 02, 1976   Date:09/30/2023       Progress Note  Chief Complaint  Patient presents with   Follow-up     Subjective:   Lauren [name] is a 48 y.o. female, presents to clinic for routine follow up on chronic conditions   Here for f/up on chronic conditions and labs DM A1C due on rybelsus 7 mg dose - she wants to increase dose again if A1C is not at goal  Lab Results  Component Value Date   HGBA1C 7.2 (H) 06/17/2023      09/30/2023    2:27 PM 09/02/2023    1:53 PM 06/17/2023   11:21 AM  Depression screen PHQ 2/9  Decreased Interest 0 0 0  Down, Depressed, Hopeless 0 0 0  PHQ - 2 Score 0 0 0  Altered sleeping 0  0  Tired, decreased energy 0  0  Change in appetite 0  0  Feeling bad or failure about yourself  0  0  Trouble concentrating 0  0  Moving slowly or fidgety/restless 0  0  Suicidal thoughts 0  0  PHQ-9 Score 0  0      Current Outpatient Medications:    acetaminophen (TYLENOL) 500 MG tablet, Take 1 tablet (500 mg total) by mouth every 6 (six) hours as needed., Disp: 30 tablet, Rfl: 0   albuterol (VENTOLIN HFA) 108 (90 Base) MCG/ACT inhaler, TAKE 2 PUFFS BY MOUTH EVERY 6 HOURS AS NEEDED FOR WHEEZE OR SHORTNESS OF BREATH, Disp: 18 each, Rfl: 1   aspirin EC 81 MG tablet, Take 1 tablet (81 mg total) by mouth daily. Swallow whole., Disp: 90 tablet, Rfl: 3   b complex vitamins capsule, Take 1 capsule by mouth daily., Disp: , Rfl:    carvedilol (COREG) 6.25 MG tablet, Take 1 tablet (6.25 mg total) by mouth 2 (two) times daily., Disp: 180 tablet, Rfl: 3   cetirizine (ZYRTEC) 10 MG tablet, Take 10 mg by mouth daily as needed for allergies., Disp: , Rfl:    dapagliflozin propanediol (FARXIGA) 10 MG TABS tablet, Take 1 tablet (10 mg total) by mouth daily., Disp: 90 tablet, Rfl: 1   DULoxetine (CYMBALTA) 60 MG capsule, Take 1 capsule (60 mg total) by mouth 2 (two) times daily., Disp: 180 capsule, Rfl: 1   fluticasone (FLONASE)  50 MCG/ACT nasal spray, Place 1 spray into both nostrils daily as needed for allergies., Disp: , Rfl:    lisinopril (ZESTRIL) 2.5 MG tablet, Take 1 tablet (2.5 mg total) by mouth daily., Disp: 90 tablet, Rfl: 3   nitroGLYCERIN (NITROSTAT) 0.3 MG SL tablet, Place 1 tablet (0.3 mg total) under the tongue every 5 (five) minutes as needed for chest pain., Disp: 90 tablet, Rfl: 12   rosuvastatin (CRESTOR) 40 MG tablet, Take 1 tablet (40 mg total) by mouth daily., Disp: 90 tablet, Rfl: 1   Semaglutide (RYBELSUS) 7 MG TABS, Take 1 tablet (7 mg total) by mouth daily., Disp: 90 tablet, Rfl: 1   traZODone (DESYREL) 100 MG tablet, Take 1 tablet (100 mg total) by mouth at bedtime., Disp: 90 tablet, Rfl: 1   valACYclovir (VALTREX) 1000 MG tablet, TAKE 1 TABLET (1,000 MG TOTAL) BY MOUTH DAILY. TAKE AS NEEDED FOR HERPES OUTBREAK., Disp: 30 tablet, Rfl: 11   Vitamin D, Ergocalciferol, (DRISDOL) 1.25 MG (50000 UNIT) CAPS capsule, Take 1 capsule (50,000 Units total) by mouth every 7 (seven) days., Disp: 12 capsule,  Rfl: 0   nicotine (NICODERM CQ) 7 mg/24hr patch, Place 1 patch (7 mg total) onto the skin daily. (Patient not taking: Reported on 09/30/2023), Disp: 21 patch, Rfl: 0  Patient Active Problem List   Diagnosis Date Noted   Insomnia due to mental disorder 06/17/2023   S/P CABG x 3: LIMA-LAD, SVG-PDA, SVG-OM 10/22/2022   Coronary artery disease involving native coronary artery without angina pectoris 10/19/2022   Chronic obstructive pulmonary disease, unspecified COPD type (HCC) 10/15/2022   Type 2 diabetes mellitus with hyperglycemia, without long-term current use of insulin (HCC) 08/18/2021   Polyp of transverse colon    Polyp of sigmoid colon    Essential hypertension 05/19/2018   Tobacco dependence 05/18/2018   Allergic rhinitis, seasonal 04/23/2017   Genital herpes 04/23/2017   Major depression, recurrent, chronic (HCC) 05/17/2016   Cholesteatoma of attic of right ear 12/05/2015   GERD  (gastroesophageal reflux disease) 11/29/2015   Vitamin D deficiency 12/13/2014   Anxiety and depression 12/13/2014   Hyperlipidemia associated with type 2 diabetes mellitus (HCC) 05/26/2014   Smoker 05/26/2014    Past Surgical History:  Procedure Laterality Date   APPENDECTOMY     CESAREAN SECTION     x 4    COLONOSCOPY     COLONOSCOPY WITH PROPOFOL N/A 07/27/2020   Procedure: COLONOSCOPY WITH PROPOFOL;  Surgeon: Irby Mannan, MD;  Location: [facility] ENDOSCOPY;  Service: Endoscopy;  Laterality: N/A;  COVID POSITIVE 07/05/2020   CORONARY ARTERY BYPASS GRAFT N/A 10/22/2022   Procedure: CORONARY ARTERY BYPASS GRAFTING (CABG) TIMES THREE USING THE LEFT INTERNAL MAMMARY ARTERY (LIMA) AND ENDOSCOPICALLY HARVESTED RIGHT GREATER SAPHENOUS VEIN;  Surgeon: Hilarie Lovely, MD;  Location: [facility] OR;  Service: Open Heart Surgery; x 3: LIMA-LAD, SVG-OM, SVG-PDA.   HERNIA REPAIR  09/25/2011   LEFT HEART CATH AND CORONARY ANGIOGRAPHY N/A 10/19/2022   Procedure: LEFT HEART CATH AND CORONARY ANGIOGRAPHY;  Surgeon: Arleen Lacer, MD;  Location: [facility] INVASIVE CV LAB;  Service: Cardiovascular; EF 50 to 55%. dLM 90% -< 95% ost LAD & LCx. 99% mRCA & 90% rPAV. => CABG.   TEE WITHOUT CARDIOVERSION N/A 10/22/2022   Procedure: TRANSESOPHAGEAL ECHOCARDIOGRAM;  Surgeon: Hilarie Lovely, MD;  Location: [facility] OR;  Service: Open Heart Surgery;  Laterality: N/A;   TRANSTHORACIC ECHOCARDIOGRAM  10/19/2022   EF 55 to 60%.  No RWMA.  GR 1 DD.  Normal RV size and function.  Normal AoV, MV.  Normal RAP.   TUBAL LIGATION     TYMPANOMASTOIDECTOMY Right 12/07/2015   Procedure: TYMPANOMASTOIDECTOMY;  Surgeon: Von Grumbling, MD;  Location: [facility] ORS;  Service: ENT;  Laterality: Right;    Family History  Problem Relation Age of Onset   Hypertension Mother    Hyperlipidemia Mother    Diabetes Mother    Arrhythmia Father 14       A-fib   Alcohol abuse Father    Breast cancer Maternal Aunt    Heart disease Brother 64        stent x 1    Alcoholism Brother    Asthma Son    Asperger's syndrome Son     Social History   Tobacco Use   Smoking status: Some Days    Current packs/day: 0.50    Average packs/day: 0.7 packs/day for 63.3 years (45.6 ttl pk-yrs)    Types: Cigarettes    Start date: 34   Smokeless tobacco: Never   Tobacco comments:    Working down number of cigarettes a  day  Hope to have cessation by Christmas 06/12/2023. 8-9 cigarettes a day.   Vaping Use   Vaping status: Never Used  Substance Use Topics   Alcohol use: Not Currently    Alcohol/week: 0.0 standard drinks of alcohol   Drug use: No     Allergies  Allergen Reactions   Cephalexin Other (See Comments)    Secondary yeast infection   Metformin And Related Nausea And Vomiting    With all doses and regular and extended release forms    Health Maintenance  Topic Date Due   COVID-19 Vaccine (3 - 2024-25 season) 02/17/2023   Diabetic kidney evaluation - Urine ACR  08/31/2023   FOOT EXAM  09/10/2023   HEMOGLOBIN A1C  12/16/2023   INFLUENZA VACCINE  01/17/2024   MAMMOGRAM  01/31/2024   Diabetic kidney evaluation - eGFR measurement  03/17/2024   OPHTHALMOLOGY EXAM  07/23/2024   Colonoscopy  07/28/2027   Cervical Cancer Screening (HPV/Pap Cotest)  09/01/2028   DTaP/Tdap/Td (4 - Td or Tdap) 08/19/2031   Pneumococcal Vaccine 53-36 Years old  Completed   Hepatitis C Screening  Completed   HIV Screening  Completed   HPV VACCINES  Aged Out   Meningococcal B Vaccine  Aged Out    Chart Review Today: I personally reviewed active problem list, medication list, allergies, family history, social history, health maintenance, notes from last encounter, lab results, imaging with the patient/caregiver today.   Review of Systems  Constitutional: Negative.   HENT: Negative.    Eyes: Negative.   Respiratory: Negative.    Cardiovascular: Negative.   Gastrointestinal: Negative.   Endocrine: Negative.   Genitourinary: Negative.    Musculoskeletal: Negative.   Skin: Negative.   Allergic/Immunologic: Negative.   Neurological: Negative.   Hematological: Negative.   Psychiatric/Behavioral: Negative.    All other systems reviewed and are negative.    Objective:   Vitals:   09/30/23 1423  BP: 122/70  Pulse: 73  Resp: 16  SpO2: 98%  Weight: 176 lb (79.8 kg)  Height: 5\' 4"  (1.626 m)    Body mass index is 30.21 kg/m.  Physical Exam Vitals and nursing note reviewed.  Constitutional:      General: She is not in acute distress.    Appearance: Normal appearance. She is well-developed. She is not ill-appearing, toxic-appearing or diaphoretic.  HENT:     Head: Normocephalic and atraumatic.     Nose: Nose normal.  Eyes:     General:        Right eye: No discharge.        Left eye: No discharge.     Conjunctiva/sclera: Conjunctivae normal.  Neck:     Trachea: No tracheal deviation.  Cardiovascular:     Rate and Rhythm: Normal rate and regular rhythm.     Pulses: Normal pulses.     Heart sounds: Normal heart sounds.  Pulmonary:     Effort: Pulmonary effort is normal. No respiratory distress.     Breath sounds: Normal breath sounds. No stridor.  Musculoskeletal:     Right lower leg: No edema.     Left lower leg: No edema.  Skin:    General: Skin is warm and dry.     Findings: No rash.  Neurological:     Mental Status: She is alert.     Motor: No abnormal muscle tone.     Coordination: Coordination normal.  Psychiatric:        Behavior: Behavior normal.  Diabetic Foot Exam - Simple   Simple Foot Form Diabetic Foot exam was performed with the following findings: Yes 09/30/2023  2:48 PM  Visual Inspection No deformities, no ulcerations, no other skin breakdown bilaterally: Yes Sensation Testing Intact to touch and monofilament testing bilaterally: Yes Pulse Check Posterior Tibialis and Dorsalis pulse intact bilaterally: Yes Comments       Results for orders placed or performed in visit  on 09/02/23  Cytology - PAP   Collection Time: 09/02/23  2:10 PM  Result Value Ref Range   High risk HPV Negative    Adequacy      Satisfactory for evaluation; transformation zone component PRESENT.   Diagnosis      - Negative for intraepithelial lesion or malignancy (NILM)   Comment Normal Reference Range HPV - Negative       Assessment & Plan:   Type 2 diabetes mellitus with hyperglycemia, without long-term current use of insulin (HCC) Assessment & Plan: Last A1C was not at goal, on rybelsus farxiga She did have some GI intolerance with GLP-1's but wishes to increase rybelsus dose back up to 14 if A1C is not at goal DM foot exam and labs done today  Orders: -     Hemoglobin A1c -     Microalbumin / creatinine urine ratio -     HM Diabetes Foot Exam -     Comprehensive metabolic panel with GFR  Vitamin D deficiency Assessment & Plan: On Rx supplement and due for recheck  Orders: -     VITAMIN D 25 Hydroxy (Vit-D Deficiency, Fractures) -     Comprehensive metabolic panel with GFR  Anxiety and depression Assessment & Plan:    09/30/2023    2:27 PM 09/02/2023    1:53 PM 06/17/2023   11:21 AM  Depression screen PHQ 2/9  Decreased Interest 0 0 0  Down, Depressed, Hopeless 0 0 0  PHQ - 2 Score 0 0 0  Altered sleeping 0  0  Tired, decreased energy 0  0  Change in appetite 0  0  Feeling bad or failure about yourself  0  0  Trouble concentrating 0  0  Moving slowly or fidgety/restless 0  0  Suicidal thoughts 0  0  PHQ-9 Score 0  0      03/18/2023    2:30 PM 01/11/2023    9:02 AM 12/14/2022    2:26 PM 11/06/2022    8:25 AM  GAD 7 : Generalized Anxiety Score  Nervous, Anxious, on Edge 0 1 3 0  Control/stop worrying 0 1 3 0  Worry too much - different things 0 1 3 0  Trouble relaxing 0 1 3 0  Restless 0 1 0 0  Easily annoyed or irritable 0 2 1 0  Afraid - awful might happen 0 1 1 0  Total GAD 7 Score 0 8 14 0  Anxiety Difficulty Not difficult at all Somewhat  difficult Somewhat difficult Not difficult at all   Doing well on trazodone for sleep, cymbalta 60 mg BID Phq 0 and gad 7 reviewed and improved/neg No med changes, stable/well controlled    Essential hypertension Assessment & Plan: BP at goal today in office BP Readings from Last 3 Encounters:  09/30/23 122/70  09/02/23 120/76  07/08/23 116/68  She is working on diet efforts, taking lisinopril 2.5, carvedilol 6.25 BID per cardiology   Orders: -     Comprehensive metabolic panel with GFR  History of herpes genitalis -  meds refilled per her request -     valACYclovir HCl; Take 1 tablet (1,000 mg total) by mouth daily. Take as needed for herpes outbreak.  Dispense: 30 tablet; Refill: 11     Return in about 6 months (around 03/31/2024) for Routine follow-up.   Adeline Hone, PA-C 09/30/23 2:38 PM

## 2023-10-01 ENCOUNTER — Other Ambulatory Visit: Payer: Self-pay | Admitting: Family Medicine

## 2023-10-01 ENCOUNTER — Encounter: Payer: Self-pay | Admitting: Family Medicine

## 2023-10-01 DIAGNOSIS — E1165 Type 2 diabetes mellitus with hyperglycemia: Secondary | ICD-10-CM

## 2023-10-01 LAB — COMPREHENSIVE METABOLIC PANEL WITH GFR
AG Ratio: 1.9 (calc) (ref 1.0–2.5)
ALT: 21 U/L (ref 6–29)
AST: 17 U/L (ref 10–35)
Albumin: 4.7 g/dL (ref 3.6–5.1)
Alkaline phosphatase (APISO): 66 U/L (ref 31–125)
BUN: 12 mg/dL (ref 7–25)
CO2: 30 mmol/L (ref 20–32)
Calcium: 9.9 mg/dL (ref 8.6–10.2)
Chloride: 104 mmol/L (ref 98–110)
Creat: 0.67 mg/dL (ref 0.50–0.99)
Globulin: 2.5 g/dL (ref 1.9–3.7)
Glucose, Bld: 107 mg/dL — ABNORMAL HIGH (ref 65–99)
Potassium: 4 mmol/L (ref 3.5–5.3)
Sodium: 141 mmol/L (ref 135–146)
Total Bilirubin: 0.3 mg/dL (ref 0.2–1.2)
Total Protein: 7.2 g/dL (ref 6.1–8.1)
eGFR: 108 mL/min/{1.73_m2} (ref >=60)

## 2023-10-01 LAB — MICROALBUMIN / CREATININE URINE RATIO
Creatinine, Urine: 59 mg/dL (ref 20–275)
Microalb, Ur: <0.2 mg/dL

## 2023-10-01 LAB — HEMOGLOBIN A1C
Hgb A1c MFr Bld: 7.3 % — ABNORMAL HIGH (ref <5.7)
Mean Plasma Glucose: 163 mg/dL
eAG (mmol/L): 9 mmol/L

## 2023-10-01 LAB — VITAMIN D 25 HYDROXY (VIT D DEFICIENCY, FRACTURES): Vit D, 25-Hydroxy: 62 ng/mL (ref 30–100)

## 2023-10-01 MED ORDER — RYBELSUS 14 MG PO TABS: 14.0000 mg | ORAL_TABLET | Freq: Every day | ORAL | 0 refills | Status: DC

## 2023-10-01 NOTE — Assessment & Plan Note (Signed)
 BP at goal today in office BP Readings from Last 3 Encounters:  09/30/23 122/70  09/02/23 120/76  07/08/23 116/68  She is working on diet efforts, taking lisinopril 2.5, carvedilol 6.25 BID per cardiology

## 2023-10-01 NOTE — Assessment & Plan Note (Signed)
 On Rx supplement and due for recheck

## 2023-10-01 NOTE — Assessment & Plan Note (Addendum)
    09/30/2023    2:27 PM 09/02/2023    1:53 PM 06/17/2023   11:21 AM  Depression screen PHQ 2/9  Decreased Interest 0 0 0  Down, Depressed, Hopeless 0 0 0  PHQ - 2 Score 0 0 0  Altered sleeping 0  0  Tired, decreased energy 0  0  Change in appetite 0  0  Feeling bad or failure about yourself  0  0  Trouble concentrating 0  0  Moving slowly or fidgety/restless 0  0  Suicidal thoughts 0  0  PHQ-9 Score 0  0      03/18/2023    2:30 PM 01/11/2023    9:02 AM 12/14/2022    2:26 PM 11/06/2022    8:25 AM  GAD 7 : Generalized Anxiety Score  Nervous, Anxious, on Edge 0 1 3 0  Control/stop worrying 0 1 3 0  Worry too much - different things 0 1 3 0  Trouble relaxing 0 1 3 0  Restless 0 1 0 0  Easily annoyed or irritable 0 2 1 0  Afraid - awful might happen 0 1 1 0  Total GAD 7 Score 0 8 14 0  Anxiety Difficulty Not difficult at all Somewhat difficult Somewhat difficult Not difficult at all   Doing well on trazodone for sleep, cymbalta 60 mg BID Phq 0 and gad 7 reviewed and improved/neg No med changes, stable/well controlled

## 2023-10-01 NOTE — Assessment & Plan Note (Signed)
 Last A1C was not at goal, on rybelsus farxiga She did have some GI intolerance with GLP-1's but wishes to increase rybelsus dose back up to 14 if A1C is not at goal DM foot exam and labs done today

## 2023-10-08 ENCOUNTER — Other Ambulatory Visit: Payer: Self-pay | Admitting: Family Medicine

## 2023-10-08 DIAGNOSIS — E1165 Type 2 diabetes mellitus with hyperglycemia: Secondary | ICD-10-CM

## 2023-10-08 NOTE — Telephone Encounter (Signed)
 Requested by interface surescripts. Medication dose discontinued 10/01/23.  Requested Prescriptions  Refused Prescriptions Disp Refills   RYBELSUS  7 MG TABS [Pharmacy Med Name: RYBELSUS  7 MG TABLET] 90 tablet 1    Sig: TAKE 1 TABLET (7 MG TOTAL) BY MOUTH DAILY     Off-Protocol Failed - 10/08/2023  4:30 PM      Failed - Medication not assigned to a protocol, review manually.      Passed - Valid encounter within last 12 months    Recent Outpatient Visits           1 week ago Type 2 diabetes mellitus with hyperglycemia, without long-term current use of insulin  [facility])   [place] [facility] Adeline Hone, PA-C   1 month ago Annual physical exam   [facility] Rockney Cid, DO       Future Appointments             In 3 weeks Addie Holstein Vashti Gentles, MD [facility]

## 2023-10-28 DIAGNOSIS — Z419 Encounter for procedure for purposes other than remedying health state, unspecified: Secondary | ICD-10-CM | POA: Diagnosis not present

## 2023-10-31 ENCOUNTER — Encounter: Payer: Self-pay | Admitting: Cardiology

## 2023-10-31 ENCOUNTER — Ambulatory Visit: Attending: Cardiology | Admitting: Cardiology

## 2023-10-31 VITALS — BP 108/74 | HR 74 | Ht 64.0 in | Wt 174.2 lb

## 2023-10-31 DIAGNOSIS — I10 Essential (primary) hypertension: Secondary | ICD-10-CM

## 2023-10-31 DIAGNOSIS — F17200 Nicotine dependence, unspecified, uncomplicated: Secondary | ICD-10-CM

## 2023-10-31 DIAGNOSIS — F5105 Insomnia due to other mental disorder: Secondary | ICD-10-CM

## 2023-10-31 DIAGNOSIS — Z951 Presence of aortocoronary bypass graft: Secondary | ICD-10-CM | POA: Diagnosis not present

## 2023-10-31 DIAGNOSIS — I200 Unstable angina: Secondary | ICD-10-CM

## 2023-10-31 DIAGNOSIS — E1169 Type 2 diabetes mellitus with other specified complication: Secondary | ICD-10-CM | POA: Diagnosis not present

## 2023-10-31 DIAGNOSIS — E785 Hyperlipidemia, unspecified: Secondary | ICD-10-CM

## 2023-10-31 DIAGNOSIS — I2510 Atherosclerotic heart disease of native coronary artery without angina pectoris: Secondary | ICD-10-CM

## 2023-10-31 DIAGNOSIS — I25110 Atherosclerotic heart disease of native coronary artery with unstable angina pectoris: Secondary | ICD-10-CM

## 2023-10-31 DIAGNOSIS — G4762 Sleep related leg cramps: Secondary | ICD-10-CM | POA: Insufficient documentation

## 2023-10-31 MED ORDER — NITROGLYCERIN 0.3 MG SL SUBL
0.3000 mg | SUBLINGUAL_TABLET | SUBLINGUAL | 3 refills | Status: DC | PRN
Start: 2023-10-31 — End: 2024-03-09

## 2023-10-31 MED ORDER — CARVEDILOL 6.25 MG PO TABS: 6.2500 mg | ORAL_TABLET | Freq: Two times a day (BID) | ORAL | 3 refills | Status: DC

## 2023-10-31 NOTE — Assessment & Plan Note (Signed)
 Currently smokes ~10 cigarettes/day. Advised to reduce smoking and discussed health risks, including vascular impact. Encouraged nicotine  replacement therapy when down to 1-2 cigarettes/day. - Reduce smoking by one cigarette every two weeks. - Consider nicotine  replacement therapy when down to 1-2 cigarettes per day.

## 2023-10-31 NOTE — Assessment & Plan Note (Signed)
 One year post-bypass surgery, asymptomatic with good graft maturation and flow.  Minimal competitive flow from native arteries and absence of downstream disease reduce graft failure risk. Doing well with no active anginal symptoms.  On stable regimen.  Did well with cardiac rehab and maintaining her level of activity. - Continue aspirin  81 mL and daily along with rosuvastatin  40 mg daily  Okay to hold aspirin  5 to 7 days preop for surgeries or procedures. - Continue carvedilol  6.25 mg twice daily (refilled) along with lisinopril  2.5 mg daily - Continue Rybelsus  and Farxiga . - Refill nitroglycerin  prescription for emergency use. - Plan stress test every 4-5 years (postop) if asymptomatic, sooner if symptoms develop.

## 2023-10-31 NOTE — Patient Instructions (Addendum)
 Please send us  a my chart message after you see your primary care provider so we can review your lab work.   Medication Instructions:  No changes at this time.   *If you need a refill on your cardiac medications before your next appointment, please call your pharmacy*  Lab Work: Lipid & LFT today here in the office.   If you have labs (blood work) drawn today and your tests are completely normal, you will receive your results only by: MyChart Message (if you have MyChart) OR A paper copy in the mail If you have any lab test that is abnormal or we need to change your treatment, we will call you to review the results.  Testing/Procedures: None  Follow-Up: At [facility], you and your health needs are our priority.  As part of our continuing mission to provide you with exceptional heart care, our providers are all part of one team.  This team includes your primary Cardiologist (physician) and Advanced Practice Providers or APPs (Physician Assistants and Nurse Practitioners) who all work together to provide you with the care you need, when you need it.  Your next appointment:   6 month(s)  Provider:   You may see Randene Bustard, MD or one of the following Advanced Practice Providers on your designated Care Team:   Laneta Pintos, NP Gildardo Labrador, PA-C Varney Gentleman, PA-C Cadence Hollister, PA-C Ronald Cockayne, NP Morey Ar, NP

## 2023-10-31 NOTE — Assessment & Plan Note (Signed)
 BP well-controlled on combination of carvedilol  6.25 mg twice daily and lisinopril  2.5 daily daily. -Continue current dose of carvedilol  and lisinopril .-Refilled carvedilol 

## 2023-10-31 NOTE — Assessment & Plan Note (Signed)
 Experiences leg cramps twice a week, primarily at rest. No indication of medication-related side effects. - Use mustard as a remedy for leg cramps.

## 2023-10-31 NOTE — Assessment & Plan Note (Signed)
 LDL cholesterol at 74, above target of <55 for significant coronary artery disease.  On Crestor  40 mg. Plan to reassess cholesterol levels to evaluate treatment efficacy and consider additional medications if LDL remains elevated. - For now we will continue Crestor  40 mg daily but low threshold to add additional medications if not at goal. - Order fasting cholesterol test today. - Reassess cholesterol levels in August.   Type 2 diabetes mellitus with elevated A1c 7.3.  On Rybelsus  14 mg (recently increased from 7 mg) and Farxiga  10 mg. Goal to reduce A1c to <6.5, ideally 6.0. - Continue Rybelsus  14 mg and Farxiga  10 mg. - Monitor A1c levels and adjust treatment as needed = managed by PCP.

## 2023-10-31 NOTE — Assessment & Plan Note (Signed)
 1 year out from CABG.  Doing well.  Recovered well with no major issues. Completed cardiac rehab. Plan would be to reassess with Myoview stress test in 4 years.

## 2023-10-31 NOTE — Assessment & Plan Note (Signed)
 Pretty well-controlled with trazodone .

## 2023-10-31 NOTE — Progress Notes (Signed)
 Cardiology Office Note:  .   Date:  10/31/2023  ID:  Lauren [name], DOB 1975/08/02, MRN [id number] PCP: Adeline Hone, PA-C  [facility] Providers Cardiologist:  Randene Bustard, MD     Chief Complaint  Patient presents with   Follow-up    Doing well.  No major complaints.   Coronary Artery Disease    1 year out from CABG.    Patient Profile: .     Lauren [name] is a ~obese 48 y.o. female with a PMH notable for CAD-CABG x 3, HTN, HLD, DM-2, COPD and GERD who presents here for 65-month follow-up at the request of Adeline Hone, PA-C.  March 2024 evaluatedfFor Progressive Angina => Cardiac Cath 10/19/2022 revealing 95% distal LM - ostial LAD & LCx as well as 99% mid RCA and 90% PAV disease CABG x 3: LIMA-LAD, SVG-PDA, SVG-OM (10/22/2022) Back to work 8/5 _ also started [facility]   I last saw Lauren [name] in September 2024-doing well.  Was try to lose weight.  Energy improving.  Feeling healthy.  Had lost 20 pounds since surgery.  She had been started on Rybelsus .  Her BP dropped to the point where she was able to stop the ACE inhibitor temporarily and then restart back it 2.5 mg.  She only started cardiac rehab in August because she been delayed by Dr. Deloise Ferries from surgery.  She enjoyed doing the rehab.  No chest pain or pressure.  Just a few skipped beats.  Nothing significant.  She had not yet able to quit smoking.  Down to 8-10 cigarettes a day from 1.5 packs a day.  She was last seen on July 08, 2023 By Ronald Cockayne, NP.  She had some moderate chest wall tenderness but was happy to be current rehab.  Walks about 2 miles a day.  Working on smoking cessation.  Still denied any chest pain dyspnea or palpitations.  Still smoking 7 cigarettes a day.  Had upcoming dental procedure planned.  Was given clearance..  Plan for 70-month follow-up.  Subjective  Discussed the use of AI scribe software for clinical note transcription with the patient, who gave verbal consent to proceed.  History of  Present Illness Lauren [name] is a 48 year old female with coronary artery disease and diabetes who presents for a follow-up visit one year after bypass surgery. She underwent bypass surgery in May 2024, and her echocardiogram results have been favorable.   She has experienced episodes of tachycardia on three occasions over the past week, though these are described as rare. No chest pain, pressure, or tightness is reported. No orthopnea, paroxysmal nocturnal dyspnea, or peripheral edema. However, she experiences leg cramps about twice a week, primarily at rest, which are alleviated by moving her leg around. She has not needed to use nitroglycerin  recently, although she requests a refill as her current supply is nearing expiration.  Her diabetes management includes Rybelsus , which was recently increased to 14 mg, and Farxiga  10 mg daily. Her last A1c was 7.3, checked last month. She has not lost weight recently and reports a slight weight gain.  She is currently taking Crestor  40 mg for cholesterol management, with her last cholesterol check in December. She also takes a baby aspirin  81 mg, carvedilol  6.25 mg twice a day, lisinopril  2.5 mg, trazodone  100 mg for sleep, Cymbalta  60 mg twice a day, Zyrtec daily for allergies, and uses Flonase  as needed. She has not used albuterol  recently.  She smokes about  ten cigarettes a day and has attempted to quit multiple times.     Objective   Current CV Medications Sig   aspirin  EC 81 MG tablet Take 1 tablet (81 mg total) by mouth daily. Swallow whole.   b complex vitamins capsule Take 1 capsule by mouth daily.   dapagliflozin  propanediol (FARXIGA ) 10 MG TABS tablet Take 1 tablet (10 mg total) by mouth daily.   lisinopril  (ZESTRIL ) 2.5 MG tablet Take 1 tablet (2.5 mg total) by mouth daily.   rosuvastatin  (CRESTOR ) 40 MG tablet Take 1 tablet (40 mg total) by mouth daily.   Semaglutide  (RYBELSUS ) 14 MG TABS Take 1 tablet (14 mg total) by mouth daily.    [Refilled] carvedilol  (COREG ) 6.25 MG tablet Take 1 tablet (6.25 mg total) by mouth 2 (two) times daily.   [Refilled] nitroGLYCERIN  (NITROSTAT ) 0.3 MG SL tablet Place 1 tablet (0.3 mg total) under the tongue every 5 (five) minutes as needed for chest pain.   Non CV Medications Sig   acetaminophen  (TYLENOL ) 500 MG tablet Take 1 tablet (500 mg total) by mouth every 6 (six) hours as needed.   albuterol  (VENTOLIN  HFA) 108 (90 Base) MCG/ACT inhaler TAKE 2 PUFFS BY MOUTH EVERY 6 HOURS AS NEEDED FOR WHEEZE OR SHORTNESS OF BREATH   b complex vitamins capsule Take 1 capsule by mouth daily.   cetirizine (ZYRTEC) 10 MG tablet Take 10 mg by mouth daily as needed for allergies.   DULoxetine  (CYMBALTA ) 60 MG capsule Take 1 capsule (60 mg total) by mouth 2 (two) times daily.   fluticasone  (FLONASE ) 50 MCG/ACT nasal spray Place 1 spray into both nostrils daily as needed for allergies.   traZODone  (DESYREL ) 100 MG tablet Take 1 tablet (100 mg total) by mouth at bedtime.   valACYclovir  (VALTREX ) 1000 MG tablet Take 1 tablet (1,000 mg total) by mouth daily. Take as needed for herpes outbreak.    Studies Reviewed: Aaron Aas        Lab Results  Component Value Date   CHOL 146 06/17/2023   HDL 49 (L) 06/17/2023   LDLCALC 77 06/17/2023   TRIG 112 06/17/2023   CHOLHDL 3.0 06/17/2023   Lab Results  Component Value Date   HGBA1C 7.3 (H) 09/30/2023   ECHO 10/19/2022: EF 55 to 60%.  No RWMA.  GR 1 DD.  Normal RV.  Normal valves.  Normal RAP. CATH-PCI 10/19/2022: EF 50 to 85%.  Severe MV CAD: Distal LM 90% involving ostial LAD and LCx.  Mid RCA 95% subtotal occlusion with 90% RPA V   Risk Assessment/Calculations:         Physical Exam:   VS:  BP 108/74   Pulse 74   Ht 5\' 4"  (1.626 m)   Wt 174 lb 3.2 oz (79 kg)   SpO2 98%   BMI 29.90 kg/m    Wt Readings from Last 3 Encounters:  10/31/23 174 lb 3.2 oz (79 kg)  09/30/23 176 lb (79.8 kg)  09/02/23 171 lb 3.2 oz (77.7 kg)    GEN: Well nourished, well developed in  no acute distress; borderline obese but otherwise healthy-appearing. NECK: No JVD; No carotid bruits CARDIAC: Normal S1, S2; RRR, no murmurs, rubs, gallops RESPIRATORY:  Clear to auscultation without rales, wheezing or rhonchi ; nonlabored, good air movement. ABDOMEN: Soft, non-tender, non-distended EXTREMITIES:  No edema; No deformity      ASSESSMENT AND PLAN: .    Problem List Items Addressed This Visit       Cardiology Problems  Coronary artery disease involving native coronary artery without angina pectoris - Primary (Chronic)   One year post-bypass surgery, asymptomatic with good graft maturation and flow.  Minimal competitive flow from native arteries and absence of downstream disease reduce graft failure risk. Doing well with no active anginal symptoms.  On stable regimen.  Did well with cardiac rehab and maintaining her level of activity. - Continue aspirin  81 mL and daily along with rosuvastatin  40 mg daily  Okay to hold aspirin  5 to 7 days preop for surgeries or procedures. - Continue carvedilol  6.25 mg twice daily (refilled) along with lisinopril  2.5 mg daily - Continue Rybelsus  and Farxiga . - Refill nitroglycerin  prescription for emergency use. - Plan stress test every 4-5 years (postop) if asymptomatic, sooner if symptoms develop.      Relevant Medications   carvedilol  (COREG ) 6.25 MG tablet   nitroGLYCERIN  (NITROSTAT ) 0.3 MG SL tablet   Essential hypertension (Chronic)   BP well-controlled on combination of carvedilol  6.25 mg twice daily and lisinopril  2.5 daily daily. -Continue current dose of carvedilol  and lisinopril .-Refilled carvedilol       Relevant Medications   carvedilol  (COREG ) 6.25 MG tablet   nitroGLYCERIN  (NITROSTAT ) 0.3 MG SL tablet   Hyperlipidemia associated with type 2 diabetes mellitus (HCC) (Chronic)   LDL cholesterol at 74, above target of <55 for significant coronary artery disease.  On Crestor  40 mg. Plan to reassess cholesterol levels to  evaluate treatment efficacy and consider additional medications if LDL remains elevated. - For now we will continue Crestor  40 mg daily but low threshold to add additional medications if not at goal. - Order fasting cholesterol test today. - Reassess cholesterol levels in August.   Type 2 diabetes mellitus with elevated A1c 7.3.  On Rybelsus  14 mg (recently increased from 7 mg) and Farxiga  10 mg. Goal to reduce A1c to <6.5, ideally 6.0. - Continue Rybelsus  14 mg and Farxiga  10 mg. - Monitor A1c levels and adjust treatment as needed = managed by PCP.      Relevant Medications   carvedilol  (COREG ) 6.25 MG tablet   nitroGLYCERIN  (NITROSTAT ) 0.3 MG SL tablet   Other Relevant Orders   Lipid panel   Hepatic function panel     Other   Insomnia due to mental disorder   Pretty well-controlled with trazodone .      Nocturnal leg cramps   Experiences leg cramps twice a week, primarily at rest. No indication of medication-related side effects. - Use mustard as a remedy for leg cramps.      S/P CABG x 3: LIMA-LAD, SVG-PDA, SVG-OM (Chronic)   1 year out from CABG.  Doing well.  Recovered well with no major issues. Completed cardiac rehab. Plan would be to reassess with Myoview stress test in 4 years.      Relevant Orders   Lipid panel   Hepatic function panel   Smoker (Chronic)   Currently smokes ~10 cigarettes/day. Advised to reduce smoking and discussed health risks, including vascular impact. Encouraged nicotine  replacement therapy when down to 1-2 cigarettes/day. - Reduce smoking by one cigarette every two weeks. - Consider nicotine  replacement therapy when down to 1-2 cigarettes per day.      Other Visit Diagnoses       Angina pectoris, unstable (HCC)       Relevant Medications   carvedilol  (COREG ) 6.25 MG tablet   nitroGLYCERIN  (NITROSTAT ) 0.3 MG SL tablet          Cardiac Rehabilitation Eligibility Assessment  -- (patient has  completed [facility])       Follow-Up: Return  in about 6 months (around 05/02/2024) for [facility] office.  Total time spent: 19 min spent with patient + 18 min spent charting = 37 min    Signed, Arleen Lacer, MD, MS Randene Bustard, M.D., M.S. Interventional Chartered certified accountant  Pager # [phone]

## 2023-11-01 LAB — LIPID PANEL
Chol/HDL Ratio: 3.6 ratio (ref 0.0–4.4)
Cholesterol, Total: 140 mg/dL (ref 100–199)
HDL: 39 mg/dL — ABNORMAL LOW (ref >39)
LDL Chol Calc (NIH): 77 mg/dL (ref 0–99)
Triglycerides: 136 mg/dL (ref 0–149)
VLDL Cholesterol Cal: 24 mg/dL (ref 5–40)

## 2023-11-01 LAB — HEPATIC FUNCTION PANEL
ALT: 25 IU/L (ref 0–32)
AST: 26 IU/L (ref 0–40)
Albumin: 4.5 g/dL (ref 3.9–4.9)
Alkaline Phosphatase: 69 IU/L (ref 44–121)
Bilirubin Total: 0.4 mg/dL (ref 0.0–1.2)
Bilirubin, Direct: 0.13 mg/dL (ref 0.00–0.40)
Total Protein: 7 g/dL (ref 6.0–8.5)

## 2023-11-06 ENCOUNTER — Ambulatory Visit: Payer: Self-pay | Admitting: Cardiology

## 2023-11-07 MED ORDER — NEXLIZET 180-10 MG PO TABS
1.0000 | ORAL_TABLET | Freq: Every day | ORAL | 11 refills | Status: DC
Start: 2023-11-07 — End: 2024-04-13

## 2023-11-07 NOTE — Telephone Encounter (Signed)
 Reviewed results and recommendations with patient. She was agreeable with plan to start the nexlizet with repeat labs at her next PCP office visit. She verbalized understanding of our conversation and had no further questions at this time.

## 2023-11-28 DIAGNOSIS — Z419 Encounter for procedure for purposes other than remedying health state, unspecified: Secondary | ICD-10-CM | POA: Diagnosis not present

## 2023-12-07 ENCOUNTER — Other Ambulatory Visit: Payer: Self-pay | Admitting: Cardiology

## 2023-12-28 DIAGNOSIS — Z419 Encounter for procedure for purposes other than remedying health state, unspecified: Secondary | ICD-10-CM | POA: Diagnosis not present

## 2023-12-30 ENCOUNTER — Ambulatory Visit: Payer: Self-pay | Admitting: *Deleted

## 2023-12-30 NOTE — Telephone Encounter (Signed)
 Lauren [name] is currently out of the office therefore appt scheduled with Lauren [name]. Pt informed

## 2023-12-30 NOTE — Telephone Encounter (Signed)
 Schedule appointment or go to [facility]

## 2023-12-30 NOTE — Telephone Encounter (Signed)
 Copied from CRM [id number]. Topic: Clinical - Red Word Triage >> Dec 30, 2023 12:32 PM Jayma L wrote: Red Word that prompted transfer to Nurse Triage: patient called in asking for a appt today she stated she is getting light headed and dizzy and this is been going on since yesterday, today she feels dizzy all day and when she turns her head she will get light headed. She's currently at work but wants to be seen today Reason for Disposition  SEVERE dizziness (e.g., unable to stand, requires support to walk, feels like passing out now)  Answer Assessment - Initial Assessment Questions 1. DESCRIPTION: Describe your dizziness.     I'm feeling lightheaded and dizzy today.   I stay hot all the time.   I don't have a fever.   Yesterday when I got up from bed I was walking through the house and I got real dizzy.  I had to grab the table.   If I turn my head fast I feel funny.   2. LIGHTHEADED: Do you feel lightheaded? (e.g., somewhat faint, woozy, weak upon standing)     Yes lightheaded and dizzy No chest pain or shortness of breath but I had a triple bypass last May.   I'm just so hot.   I have a little pain between my shoulder blades.   I've been having cramps in my thighs since last week.   I'm trying to drink water and salt.   I had a bad dizzy spell a few minutes ago.     3. VERTIGO: Do you feel like either you or the room is spinning or tilting? (i.e., vertigo)     No  4. SEVERITY: How bad is it?  Do you feel like you are going to faint? Can you stand and walk?     I'm holding onto things.   I'll call my mother to come get me after I referred her to the ED.  5. ONSET:  When did the dizziness begin?     Yesterday 6. AGGRAVATING FACTORS: Does anything make it worse? (e.g., standing, change in head position)     Moving my head real fast. 7. HEART RATE: Can you tell me your heart rate? How many beats in 15 seconds?  (Note: Not all patients can do this.)       Not asked 8. CAUSE:  What do you think is causing the dizziness? (e.g., decreased fluids or food, diarrhea, emotional distress, heat exposure, new medicine, sudden standing, vomiting; unknown)     I don't know 9. RECURRENT SYMPTOM: Have you had dizziness before? If Yes, ask: When was the last time? What happened that time?     I did have a triple bypass surgery last May. 10. OTHER SYMPTOMS: Do you have any other symptoms? (e.g., fever, chest pain, vomiting, diarrhea, bleeding)       Sweating a lot all the time.   Everyone else around me is cold and walking around in hoodies here at work. 11. PREGNANCY: Is there any chance you are pregnant? When was your last menstrual period?       Not asked due to age  Protocols used: Dizziness - Lightheadedness-A-AH FYI Only or Action Required?: FYI only for provider.  Patient was last seen in primary care on 09/02/2023 by Bernardo Fend, DO.  Called Nurse Triage reporting Dizziness.  Symptoms began yesterday.  Interventions attempted: Rest, hydration, or home remedies.  Symptoms are: rapidly worsening Very dizzy and light headed  .  Triage Disposition: Go to ED Now (or PCP Triage)  Patient/caregiver understands and will follow disposition?: Yes

## 2023-12-31 ENCOUNTER — Ambulatory Visit (INDEPENDENT_AMBULATORY_CARE_PROVIDER_SITE_OTHER): Admitting: Nurse Practitioner

## 2023-12-31 ENCOUNTER — Encounter: Payer: Self-pay | Admitting: Nurse Practitioner

## 2023-12-31 VITALS — BP 126/76 | HR 81 | Temp 98.0°F | Resp 18 | Ht 64.0 in | Wt 179.4 lb

## 2023-12-31 DIAGNOSIS — R2681 Unsteadiness on feet: Secondary | ICD-10-CM

## 2023-12-31 DIAGNOSIS — R42 Dizziness and giddiness: Secondary | ICD-10-CM | POA: Diagnosis not present

## 2023-12-31 LAB — CBC WITH DIFFERENTIAL/PLATELET
Absolute Lymphocytes: 2402 {cells}/uL (ref 850–3900)
Absolute Monocytes: 290 {cells}/uL (ref 200–950)
Basophils Absolute: 59 {cells}/uL (ref 0–200)
Basophils Relative: 0.9 %
Eosinophils Absolute: 119 {cells}/uL (ref 15–500)
Eosinophils Relative: 1.8 %
HCT: 49.5 % — ABNORMAL HIGH (ref 35.0–45.0)
Hemoglobin: 16.1 g/dL — ABNORMAL HIGH (ref 11.7–15.5)
MCH: 31.1 pg (ref 27.0–33.0)
MCHC: 32.5 g/dL (ref 32.0–36.0)
MCV: 95.7 fL (ref 80.0–100.0)
MPV: 10.6 fL (ref 7.5–12.5)
Monocytes Relative: 4.4 %
Neutro Abs: 3729 {cells}/uL (ref 1500–7800)
Neutrophils Relative %: 56.5 %
Platelets: 172 Thousand/uL (ref 140–400)
RBC: 5.17 Million/uL — ABNORMAL HIGH (ref 3.80–5.10)
RDW: 12.7 % (ref 11.0–15.0)
Total Lymphocyte: 36.4 %
WBC: 6.6 Thousand/uL (ref 3.8–10.8)

## 2023-12-31 LAB — COMPREHENSIVE METABOLIC PANEL WITH GFR
AG Ratio: 1.8 (calc) (ref 1.0–2.5)
ALT: 46 U/L — ABNORMAL HIGH (ref 6–29)
AST: 44 U/L — ABNORMAL HIGH (ref 10–35)
Albumin: 4.7 g/dL (ref 3.6–5.1)
Alkaline phosphatase (APISO): 53 U/L (ref 31–125)
BUN: 12 mg/dL (ref 7–25)
CO2: 28 mmol/L (ref 20–32)
Calcium: 9.6 mg/dL (ref 8.6–10.2)
Chloride: 104 mmol/L (ref 98–110)
Creat: 0.57 mg/dL (ref 0.50–0.99)
Globulin: 2.6 g/dL (ref 1.9–3.7)
Glucose, Bld: 156 mg/dL — ABNORMAL HIGH (ref 65–99)
Potassium: 4.4 mmol/L (ref 3.5–5.3)
Sodium: 137 mmol/L (ref 135–146)
Total Bilirubin: 0.6 mg/dL (ref 0.2–1.2)
Total Protein: 7.3 g/dL (ref 6.1–8.1)
eGFR: 112 mL/min/1.73m2 (ref >=60)

## 2023-12-31 LAB — VITAMIN B12: Vitamin B-12: 382 pg/mL (ref 200–1100)

## 2023-12-31 LAB — TSH: TSH: 1.12 m[IU]/L

## 2023-12-31 NOTE — Progress Notes (Signed)
 BP 126/76   Pulse 81   Temp 98 F (36.7 C)   Resp 18   Ht 5' 4 (1.626 m)   Wt 179 lb 6.4 oz (81.4 kg)   SpO2 96%   BMI 30.79 kg/m    Subjective:    Patient ID: Lauren [name], female    DOB: Mar 07, 1976, 48 y.o.   MRN: [id number]  HPI: Lauren [name] is a 48 y.o. female  Chief Complaint  Patient presents with   Dizziness    X2 days    Discussed the use of AI scribe software for clinical note transcription with the patient, who gave verbal consent to proceed.  History of Present Illness Lauren [name] is a 48 year old female who presents with dizziness.  She experiences dizziness when turning her head, standing up, and walking. These symptoms began a couple of days ago and are described as a [name] of unsteadiness or as if she might fall, without a sensation of the room spinning. Neuro exam completed and showed no strength deficit or arm drift.  She did have a positive romberg and has an unsteady gait.    No headaches, chest pain, shortness of breath, changes in vision, blurred vision, nausea, numbness, or tingling. She also denies any history of similar episodes in the past.  Her past medical history includes heart surgery. She reports that her blood sugar levels have been stable.   Discussed with patient that we will get orthostatics-which were normal, EKG-NSR, labs  and brain imaging.   Orthostatic VS for the past 24 hrs:  BP- Lying Pulse- Lying BP- Sitting Pulse- Sitting BP- Standing at 0 minutes Pulse- Standing at 0 minutes  12/31/23 1035 118/84 76 116/80 79 120/80 84         12/31/2023   10:19 AM 09/30/2023    2:27 PM 09/02/2023    1:53 PM  Depression screen PHQ 2/9  Decreased Interest 0 0 0  Down, Depressed, Hopeless 0 0 0  PHQ - 2 Score 0 0 0  Altered sleeping 0 0   Tired, decreased energy 0 0   Change in appetite 0 0   [name] bad or failure about yourself  0 0   Trouble concentrating 0 0   Moving slowly or fidgety/restless 0 0   Suicidal thoughts 0 0    PHQ-9 Score 0 0   Difficult doing work/chores Not difficult at all      Relevant past medical, surgical, family and social history reviewed and updated as indicated. Interim medical history since our last visit reviewed. Allergies and medications reviewed and updated.  Review of Systems  Ten systems reviewed and is negative except as mentioned in HPI      Objective:     BP 126/76   Pulse 81   Temp 98 F (36.7 C)   Resp 18   Ht 5' 4 (1.626 m)   Wt 179 lb 6.4 oz (81.4 kg)   SpO2 96%   BMI 30.79 kg/m    Wt Readings from Last 3 Encounters:  12/31/23 179 lb 6.4 oz (81.4 kg)  10/31/23 174 lb 3.2 oz (79 kg)  09/30/23 176 lb (79.8 kg)    Physical Exam Physical Exam GENERAL: Alert, cooperative, well developed, no acute distress. HEENT: Normocephalic, normal oropharynx, moist mucous membranes. CHEST: Clear to auscultation bilaterally, no wheezes, rhonchi, or crackles. CARDIOVASCULAR: Normal heart rate and rhythm, S1 and S2 normal without murmurs. ABDOMEN: Soft, non-tender, non-distended, without organomegaly, normal bowel  sounds. EXTREMITIES: No cyanosis or edema. NEUROLOGICAL: Cranial nerves grossly intact, moves all extremities without gross motor or sensory deficit, gait slightly unsteady, Romberg test slightly positive.   Results for orders placed or performed in visit on 10/31/23  Hepatic function panel   Collection Time: 10/31/23  8:48 AM  Result Value Ref Range   Total Protein 7.0 6.0 - 8.5 g/dL   Albumin  4.5 3.9 - 4.9 g/dL   Bilirubin Total 0.4 0.0 - 1.2 mg/dL   Bilirubin, Direct 9.86 0.00 - 0.40 mg/dL   Alkaline Phosphatase 69 44 - 121 IU/L   AST 26 0 - 40 IU/L   ALT 25 0 - 32 IU/L  Lipid panel   Collection Time: 10/31/23  8:48 AM  Result Value Ref Range   Cholesterol, Total 140 100 - 199 mg/dL   Triglycerides 863 0 - 149 mg/dL   HDL 39 (L) >60 mg/dL   VLDL Cholesterol Cal 24 5 - 40 mg/dL   LDL Chol Calc (NIH) 77 0 - 99 mg/dL   Chol/HDL Ratio 3.6 0.0 - 4.4  ratio          Assessment & Plan:   Problem List Items Addressed This Visit   None Visit Diagnoses       Dizziness    -  Primary   Relevant Orders   Orthostatic vital signs   EKG 12-Lead   MR BRAIN W WO CONTRAST   CBC with Differential/Platelet   Comprehensive metabolic panel with GFR   TSH   Vitamin B12     Unsteady gait       Relevant Orders   Orthostatic vital signs   EKG 12-Lead   MR BRAIN W WO CONTRAST   CBC with Differential/Platelet   Comprehensive metabolic panel with GFR   TSH   Vitamin B12        Assessment and Plan Assessment & Plan Dizziness Dizziness for several days, triggered by head movement, standing, and walking. No associated headaches, chest pain, shortness of breath, vision changes, nausea, or numbness. Orthostatic vitals and EKG are normal. Differential includes possible stroke, necessitating imaging. No immediate signs of orthostatic hypotension or cardiac issues. Insurance approval for MRI is pending, with potential need for CT if required by insurance. - Order MRI of the brain to rule out stroke - Order blood work to investigate underlying causes - Advise to seek emergency care if dizziness worsens during workup        Follow up plan: Return if symptoms worsen or fail to improve.

## 2023-12-31 NOTE — Addendum Note (Signed)
 Addended by: Castor Gittleman F on: 12/31/2023 10:57 AM   Modules accepted: Orders

## 2024-01-01 ENCOUNTER — Ambulatory Visit
Admission: RE | Admit: 2024-01-01 | Discharge: 2024-01-01 | Disposition: A | Discharge status: Home / Self Care | Source: Ambulatory Visit | Attending: Nurse Practitioner | Admitting: Nurse Practitioner

## 2024-01-01 ENCOUNTER — Ambulatory Visit: Payer: Self-pay | Admitting: Nurse Practitioner

## 2024-01-01 DIAGNOSIS — R42 Dizziness and giddiness: Secondary | ICD-10-CM

## 2024-01-01 DIAGNOSIS — R2681 Unsteadiness on feet: Secondary | ICD-10-CM

## 2024-01-01 MED ORDER — GADOPICLENOL 0.5 MMOL/ML IV SOLN
8.0000 mL | Freq: Once | INTRAVENOUS | Status: AC | PRN
  Administered 2024-01-01: 8 mL via INTRAVENOUS

## 2024-01-09 ENCOUNTER — Other Ambulatory Visit: Payer: Self-pay | Admitting: Family Medicine

## 2024-01-09 DIAGNOSIS — E1165 Type 2 diabetes mellitus with hyperglycemia: Secondary | ICD-10-CM

## 2024-01-10 ENCOUNTER — Other Ambulatory Visit: Payer: Self-pay | Admitting: Family Medicine

## 2024-01-10 ENCOUNTER — Other Ambulatory Visit: Payer: Self-pay | Admitting: Physician Assistant

## 2024-01-10 DIAGNOSIS — E1169 Type 2 diabetes mellitus with other specified complication: Secondary | ICD-10-CM

## 2024-01-10 DIAGNOSIS — F339 Major depressive disorder, recurrent, unspecified: Secondary | ICD-10-CM

## 2024-01-10 DIAGNOSIS — E1165 Type 2 diabetes mellitus with hyperglycemia: Secondary | ICD-10-CM

## 2024-01-10 NOTE — Telephone Encounter (Signed)
 Requested medication (s) are due for refill today: yes  Requested medication (s) are on the active medication list: yes  Last refill:  10/01/23  Future visit scheduled: yes  Notes to clinic:  Medication not assigned to a protocol, review manually.      Requested Prescriptions  Pending Prescriptions Disp Refills   RYBELSUS  14 MG TABS [Pharmacy Med Name: RYBELSUS  14 MG TABLET] 30 tablet 2    Sig: Take 1 tablet (14 mg total) by mouth daily.     Off-Protocol Failed - 01/10/2024 12:28 PM      Failed - Medication not assigned to a protocol, review manually.      Passed - Valid encounter within last 12 months    Recent Outpatient Visits           1 week ago Dizziness   [facility] Gareth Clarity F, FNP   3 months ago Type 2 diabetes mellitus with hyperglycemia, without long-term current use of insulin  [facility])   [facility] Leavy Mole, PA-C   4 months ago Annual physical exam   [facility] Bernardo Fend, [place]

## 2024-01-13 ENCOUNTER — Telehealth: Payer: Self-pay | Admitting: Pharmacy Technician

## 2024-01-13 ENCOUNTER — Other Ambulatory Visit (HOSPITAL_BASED_OUTPATIENT_CLINIC_OR_DEPARTMENT_OTHER): Payer: Self-pay

## 2024-01-13 ENCOUNTER — Other Ambulatory Visit (HOSPITAL_COMMUNITY): Payer: Self-pay

## 2024-01-13 NOTE — Telephone Encounter (Signed)
 Pharmacy Patient Advocate Encounter  Received notification from [facility] that Prior Authorization for Farxiga  10MG  tablets has been APPROVED from 01/13/2024 to 01/12/25. Unable to obtain price due to refill too soon rejection, last fill date 01/13/24 next available fill date10/08/25   PA #/Case ID/Reference #:  [id number]

## 2024-01-13 NOTE — Telephone Encounter (Signed)
 Lipid panel in date.  Requested Prescriptions  Pending Prescriptions Disp Refills   rosuvastatin  (CRESTOR ) 40 MG tablet [Pharmacy Med Name: ROSUVASTATIN  CALCIUM  40 MG TAB] 90 tablet 1    Sig: TAKE 1 TABLET BY MOUTH EVERY DAY     Cardiovascular:  Antilipid - Statins 2 Failed - 01/13/2024  1:26 PM      Failed - Lipid Panel in normal range within the last 12 months    Cholesterol, Total  Date Value Ref Range Status  10/31/2023 140 100 - 199 mg/dL Final   LDL Cholesterol (Calc)  Date Value Ref Range Status  06/17/2023 77 mg/dL (calc) Final    Comment:    Reference range: <100 . Desirable range <100 mg/dL for primary prevention;   <70 mg/dL for patients with CHD or diabetic patients  with > or = 2 CHD risk factors. SABRA LDL-C is now calculated using the Martin-Hopkins  calculation, which is a validated novel method providing  better accuracy than the Friedewald equation in the  estimation of LDL-C.  Gladis APPLETHWAITE et al. SANDREA. 7986;689(80): 2061-2068  (http://education.[facility].com/faq/FAQ164)    LDL Chol Calc (NIH)  Date Value Ref Range Status  10/31/2023 77 0 - 99 mg/dL Final   HDL  Date Value Ref Range Status  10/31/2023 39 (L) >39 mg/dL Final   Triglycerides  Date Value Ref Range Status  10/31/2023 136 0 - 149 mg/dL Final         Passed - Cr in normal range and within 360 days    Creat  Date Value Ref Range Status  12/31/2023 0.57 0.50 - 0.99 mg/dL Final   Creatinine, POC  Date Value Ref Range Status  03/16/2016 NEG mg/dL Final   Creatinine, Urine  Date Value Ref Range Status  09/30/2023 59 20 - 275 mg/dL Final         Passed - Patient is not pregnant      Passed - Valid encounter within last 12 months    Recent Outpatient Visits           1 week ago Dizziness   [facility] Gareth Mliss FALCON, FNP   3 months ago Type 2 diabetes mellitus with hyperglycemia, without long-term current use of insulin  [facility])   [place]  [facility] Leavy Mole, PA-C   4 months ago Annual physical exam   [facility] Bernardo Fend, DO               DULoxetine  (CYMBALTA ) 60 MG capsule [Pharmacy Med Name: DULOXETINE  HCL DR 60 MG CAP] 180 capsule 1    Sig: TAKE 1 CAPSULE BY MOUTH 2 TIMES DAILY.     Psychiatry: Antidepressants - SNRI - duloxetine  Passed - 01/13/2024  1:26 PM      Passed - Cr in normal range and within 360 days    Creat  Date Value Ref Range Status  12/31/2023 0.57 0.50 - 0.99 mg/dL Final   Creatinine, POC  Date Value Ref Range Status  03/16/2016 NEG mg/dL Final   Creatinine, Urine  Date Value Ref Range Status  09/30/2023 59 20 - 275 mg/dL Final         Passed - eGFR is 30 or above and within 360 days    GFR, Est African American  Date Value Ref Range Status  09/28/2020 126 > OR = 60 mL/min/1.41m2 Final   GFR, Est Non African American  Date Value Ref Range Status  09/28/2020 108 > OR =  60 mL/min/1.65m2 Final   GFR, Estimated  Date Value Ref Range Status  11/13/2022 >60 >60 mL/min Final    Comment:    (NOTE) Calculated using the CKD-EPI Creatinine Equation (2021)    eGFR  Date Value Ref Range Status  12/31/2023 112 > OR = 60 mL/min/1.53m2 Final  10/15/2022 112 >59 mL/min/1.73 Final         Passed - Completed PHQ-2 or PHQ-9 in the last 360 days      Passed - Last BP in normal range    BP Readings from Last 1 Encounters:  12/31/23 126/76         Passed - Valid encounter within last 6 months    Recent Outpatient Visits           1 week ago Dizziness   [facility] Gareth Clarity F, FNP   3 months ago Type 2 diabetes mellitus with hyperglycemia, without long-term current use of insulin  [facility])   [facility] Leavy Mole, PA-C   4 months ago Annual physical exam   [facility] Bernardo Fend, [place]

## 2024-01-13 NOTE — Telephone Encounter (Signed)
 Pharmacy Patient Advocate Encounter   Received notification from CoverMyMeds that prior authorization for Farxiga  10MG  tablets is required/requested.   Insurance verification completed.   The patient is insured through [facility] .   Per test claim: PA required; PA submitted to above mentioned insurance via CoverMyMeds Key/confirmation #/EOC [id number] Status is pending

## 2024-01-13 NOTE — Telephone Encounter (Signed)
 Requested Prescriptions  Refused Prescriptions Disp Refills   RYBELSUS  7 MG TABS [Pharmacy Med Name: RYBELSUS  7 MG TABLET] 90 tablet 1    Sig: TAKE 1 TABLET (7 MG TOTAL) BY MOUTH DAILY     Off-Protocol Failed - 01/13/2024  1:26 PM      Failed - Medication not assigned to a protocol, review manually.      Passed - Valid encounter within last 12 months    Recent Outpatient Visits           1 week ago Dizziness   [facility] Gareth Clarity F, FNP   3 months ago Type 2 diabetes mellitus with hyperglycemia, without long-term current use of insulin  [facility])   [facility] Leavy Mole, PA-C   4 months ago Annual physical exam   [facility] Bernardo Fend, [place]

## 2024-01-28 DIAGNOSIS — Z419 Encounter for procedure for purposes other than remedying health state, unspecified: Secondary | ICD-10-CM | POA: Diagnosis not present

## 2024-02-28 DIAGNOSIS — Z419 Encounter for procedure for purposes other than remedying health state, unspecified: Secondary | ICD-10-CM | POA: Diagnosis not present

## 2024-03-07 ENCOUNTER — Other Ambulatory Visit: Payer: Self-pay | Admitting: Cardiology

## 2024-03-07 DIAGNOSIS — I200 Unstable angina: Secondary | ICD-10-CM

## 2024-03-31 ENCOUNTER — Ambulatory Visit: Admitting: Family Medicine

## 2024-04-01 ENCOUNTER — Ambulatory Visit: Admitting: Family Medicine

## 2024-04-02 ENCOUNTER — Ambulatory Visit (INDEPENDENT_AMBULATORY_CARE_PROVIDER_SITE_OTHER): Admitting: Nurse Practitioner

## 2024-04-02 ENCOUNTER — Telehealth: Payer: Self-pay | Admitting: Pharmacy Technician

## 2024-04-02 ENCOUNTER — Encounter: Payer: Self-pay | Admitting: Nurse Practitioner

## 2024-04-02 ENCOUNTER — Other Ambulatory Visit: Payer: Self-pay | Admitting: Family Medicine

## 2024-04-02 ENCOUNTER — Other Ambulatory Visit (HOSPITAL_COMMUNITY): Payer: Self-pay

## 2024-04-02 VITALS — BP 118/84 | HR 60 | Temp 98.0°F | Ht 64.0 in | Wt 178.0 lb

## 2024-04-02 DIAGNOSIS — E1165 Type 2 diabetes mellitus with hyperglycemia: Secondary | ICD-10-CM

## 2024-04-02 DIAGNOSIS — Z951 Presence of aortocoronary bypass graft: Secondary | ICD-10-CM

## 2024-04-02 DIAGNOSIS — Z1231 Encounter for screening mammogram for malignant neoplasm of breast: Secondary | ICD-10-CM

## 2024-04-02 DIAGNOSIS — F419 Anxiety disorder, unspecified: Secondary | ICD-10-CM

## 2024-04-02 DIAGNOSIS — R748 Abnormal levels of other serum enzymes: Secondary | ICD-10-CM

## 2024-04-02 DIAGNOSIS — I2510 Atherosclerotic heart disease of native coronary artery without angina pectoris: Secondary | ICD-10-CM

## 2024-04-02 DIAGNOSIS — F5105 Insomnia due to other mental disorder: Secondary | ICD-10-CM

## 2024-04-02 DIAGNOSIS — K219 Gastro-esophageal reflux disease without esophagitis: Secondary | ICD-10-CM

## 2024-04-02 DIAGNOSIS — A6000 Herpesviral infection of urogenital system, unspecified: Secondary | ICD-10-CM

## 2024-04-02 DIAGNOSIS — Z23 Encounter for immunization: Secondary | ICD-10-CM

## 2024-04-02 DIAGNOSIS — F339 Major depressive disorder, recurrent, unspecified: Secondary | ICD-10-CM

## 2024-04-02 DIAGNOSIS — J449 Chronic obstructive pulmonary disease, unspecified: Secondary | ICD-10-CM

## 2024-04-02 DIAGNOSIS — E1169 Type 2 diabetes mellitus with other specified complication: Secondary | ICD-10-CM

## 2024-04-02 DIAGNOSIS — I10 Essential (primary) hypertension: Secondary | ICD-10-CM | POA: Diagnosis not present

## 2024-04-02 DIAGNOSIS — J3089 Other allergic rhinitis: Secondary | ICD-10-CM

## 2024-04-02 MED ORDER — TRAZODONE HCL 100 MG PO TABS: 100.0000 mg | ORAL_TABLET | Freq: Every day | ORAL | 1 refills | Status: DC

## 2024-04-02 MED ORDER — CARIPRAZINE HCL 1.5 MG PO CAPS: 1.5000 mg | ORAL_CAPSULE | Freq: Every day | ORAL | 0 refills | Status: DC

## 2024-04-02 NOTE — Progress Notes (Signed)
 BP 118/84   Pulse 60   Temp 98 F (36.7 C)   Ht 5' 4 (1.626 m)   Wt 178 lb (80.7 kg)   SpO2 95%   BMI 30.55 kg/m    Subjective:    Patient ID: Lauren Lauren [name], Lauren [name]    DOB: 06/19/1975, 48 y.o.   MRN: [id number]  HPI: Lauren Lauren [name] is a 48 y.o. Lauren [name]  Chief Complaint  Patient presents with   Follow-up    Has stopped taking rybelsus  x1 month. Unable to afford.    Discussed the use of AI scribe software for clinical note transcription with the patient, who gave verbal consent to proceed.  History of Present Illness Lauren Lauren [name] is a 48 year old Lauren [name] who presents for a routine follow-up.  Depressive symptoms - Increased symptoms of depression, including lack of energy, irritability, and lack of motivation to engage in activities, including work - Symptoms have intensified recently despite continued use of Cymbalta  60 mg twice daily  Sleep disturbance - Trazodone  100 mg at bedtime has not been refilled since June and has not been taken for the past month - Believes lack of trazodone  may be affecting sleep quality  Type 2 diabetes mellitus - Not taking Rybelsus  for the past month due to cost issues - Currently taking Farxiga  10 mg daily - Last hemoglobin A1c in April was 7.3% -referral placed to pharmacist for assistance  Coronary artery disease status post cabg - History of coronary artery disease with prior coronary artery bypass grafting - Current medications include aspirin  81 mg daily, carvedilol  6.25 mg twice daily, lisinopril  2.5 mg daily, and rosuvastatin  40 mg daily -managed by cardiology  Chronic obstructive pulmonary disease (copd) - Uses albuterol  inhaler as needed -denies any shortness of breath  Allergic rhinitis - Uses Flonase  as needed - Takes Zyrtec 10 mg daily as needed  Hyperlipidemia - Takes Nexlizet  daily - Takes rosuvastatin  40 mg daily Lipid Panel     Component Value Date/Time   CHOL 140 10/31/2023 0848   TRIG 136 10/31/2023 0848    HDL 39 (L) 10/31/2023 0848   CHOLHDL 3.6 10/31/2023 0848   CHOLHDL 3.0 06/17/2023 1158   VLDL 31 (H) 03/16/2016 1048   LDLCALC 77 10/31/2023 0848   LDLCALC 77 06/17/2023 1158   LABVLDL 24 10/31/2023 0848     Genital herpes - Takes Valtrex  1000 mg daily  Elevated liver enzymes - Liver enzymes were noted to be elevated in July    Latest Ref Rng & Units 12/31/2023   11:16 AM 10/31/2023    8:48 AM 09/30/2023    2:52 PM  CMP  Glucose 65 - 99 mg/dL 843   892   BUN 7 - 25 mg/dL 12   12   Creatinine 9.49 - 0.99 mg/dL 9.42   9.32   Sodium 864 - 146 mmol/L 137   141   Potassium 3.5 - 5.3 mmol/L 4.4   4.0   Chloride 98 - 110 mmol/L 104   104   CO2 20 - 32 mmol/L 28   30   Calcium  8.6 - 10.2 mg/dL 9.6   9.9   Total Protein 6.1 - 8.1 g/dL 7.3  7.0  7.2   Total Bilirubin 0.2 - 1.2 mg/dL 0.6  0.4  0.3   Alkaline Phos 44 - 121 IU/L  69    AST 10 - 35 U/L 44  26  17   ALT 6 - 29 U/L 46  25  21             04/02/2024    1:47 PM 12/31/2023   10:19 AM 09/30/2023    2:27 PM  Depression screen PHQ 2/9  Decreased Interest 1 0 0  Down, Depressed, Hopeless 2 0 0  PHQ - 2 Score 3 0 0  Altered sleeping 1 0 0  Tired, decreased energy 1 0 0  Change in appetite 2 0 0  Lauren [name] bad or failure about yourself  1 0 0  Trouble concentrating 1 0 0  Moving slowly or fidgety/restless 1 0 0  Suicidal thoughts 1 0 0  PHQ-9 Score 11 0 0  Difficult doing work/chores Somewhat difficult Not difficult at all        04/02/2024    1:00 PM 12/31/2023   10:20 AM 03/18/2023    2:30 PM 01/11/2023    9:02 AM  GAD 7 : Generalized Anxiety Score  Nervous, Anxious, on Edge 1 0 0 1  Control/stop worrying 1 0 0 1  Worry too much - different things 1 0 0 1  Trouble relaxing 2 0 0 1  Restless 2 0 0 1  Easily annoyed or irritable 3 0 0 2  Afraid - awful might happen 3 0 0 1  Total GAD 7 Score 13 0 0 8  Anxiety Difficulty Very difficult Not difficult at all Not difficult at all Somewhat difficult     Relevant  past medical, surgical, family and social history reviewed and updated as indicated. Interim medical history since our last visit reviewed. Allergies and medications reviewed and updated.  Review of Systems  Constitutional: Negative for fever or weight change.  Respiratory: Negative for cough and shortness of breath.   Cardiovascular: Negative for chest pain or palpitations.  Gastrointestinal: Negative for abdominal pain, no bowel changes.  Musculoskeletal: Negative for gait problem or joint swelling.  Skin: Negative for rash.  Neurological: Negative for dizziness or headache.  No other specific complaints in a complete review of systems (except as listed in HPI above).      Objective:      BP 118/84   Pulse 60   Temp 98 F (36.7 C)   Ht 5' 4 (1.626 m)   Wt 178 lb (80.7 kg)   SpO2 95%   BMI 30.55 kg/m    Wt Readings from Last 3 Encounters:  04/02/24 178 lb (80.7 kg)  12/31/23 179 lb 6.4 oz (81.4 kg)  10/31/23 174 lb 3.2 oz (79 kg)    Physical Exam GENERAL: Alert, cooperative, well developed, no acute distress. HEENT: Normocephalic, normal oropharynx, moist mucous membranes. CHEST: Clear to auscultation bilaterally, no wheezes, rhonchi, or crackles. CARDIOVASCULAR: Regular rate and rhythm, S1 and S2 normal without murmurs. ABDOMEN: Soft, non-tender, non-distended, without organomegaly, normal bowel sounds. EXTREMITIES: No cyanosis or edema. NEUROLOGICAL: Cranial nerves grossly intact, moves all extremities without gross motor or sensory deficit.  Results for orders placed or performed in visit on 12/31/23  CBC with Differential/Platelet   Collection Time: 12/31/23 11:16 AM  Result Value Ref Range   WBC 6.6 3.8 - 10.8 Thousand/uL   RBC 5.17 (H) 3.80 - 5.10 Million/uL   Hemoglobin 16.1 (H) 11.7 - 15.5 g/dL   HCT 50.4 (H) 64.9 - 54.9 %   MCV 95.7 80.0 - 100.0 fL   MCH 31.1 27.0 - 33.0 pg   MCHC 32.5 32.0 - 36.0 g/dL   RDW 87.2 88.9 - 84.9 %   Platelets 172 140 - 400  Thousand/uL   MPV 10.6 7.5 - 12.5 fL   Neutro Abs 3,729 1,500 - 7,800 cells/uL   Absolute Lymphocytes 2,402 850 - 3,900 cells/uL   Absolute Monocytes 290 200 - 950 cells/uL   Eosinophils Absolute 119 15 - 500 cells/uL   Basophils Absolute 59 0 - 200 cells/uL   Neutrophils Relative % 56.5 %   Total Lymphocyte 36.4 %   Monocytes Relative 4.4 %   Eosinophils Relative 1.8 %   Basophils Relative 0.9 %  Comprehensive metabolic panel with GFR   Collection Time: 12/31/23 11:16 AM  Result Value Ref Range   Glucose, Bld 156 (H) 65 - 99 mg/dL   BUN 12 7 - 25 mg/dL   Creat 9.42 9.49 - 9.00 mg/dL   eGFR 887 > OR = 60 fO/fpw/8.26f7   BUN/Creatinine Ratio SEE NOTE: 6 - 22 (calc)   Sodium 137 135 - 146 mmol/L   Potassium 4.4 3.5 - 5.3 mmol/L   Chloride 104 98 - 110 mmol/L   CO2 28 20 - 32 mmol/L   Calcium  9.6 8.6 - 10.2 mg/dL   Total Protein 7.3 6.1 - 8.1 g/dL   Albumin  4.7 3.6 - 5.1 g/dL   Globulin 2.6 1.9 - 3.7 g/dL (calc)   AG Ratio 1.8 1.0 - 2.5 (calc)   Total Bilirubin 0.6 0.2 - 1.2 mg/dL   Alkaline phosphatase (APISO) 53 31 - 125 U/L   AST 44 (H) 10 - 35 U/L   ALT 46 (H) 6 - 29 U/L  TSH   Collection Time: 12/31/23 11:16 AM  Result Value Ref Range   TSH 1.12 mIU/L  Vitamin B12   Collection Time: 12/31/23 11:16 AM  Result Value Ref Range   Vitamin B-12 382 200 - 1,100 pg/mL          Assessment & Plan:   Problem List Items Addressed This Visit       Cardiovascular and Mediastinum   Essential hypertension (Chronic)   Relevant Orders   Comprehensive metabolic panel with GFR   CBC with Differential/Platelet   AMB Referral VBCI Care Management   Coronary artery disease involving native coronary artery without angina pectoris (Chronic)   Relevant Orders   Lipid panel   AMB Referral VBCI Care Management     Respiratory   Chronic obstructive pulmonary disease, unspecified COPD type (HCC) (Chronic)   Relevant Orders   AMB Referral VBCI Care Management   Allergic  rhinitis, seasonal     Digestive   GERD (gastroesophageal reflux disease)     Endocrine   Hyperlipidemia associated with type 2 diabetes mellitus (HCC) (Chronic)   Relevant Orders   Comprehensive metabolic panel with GFR   Lipid panel   AMB Referral VBCI Care Management   Type 2 diabetes mellitus with hyperglycemia, without long-term current use of insulin  (HCC) (Chronic)   Relevant Orders   Hemoglobin A1c   AMB Referral VBCI Care Management     Genitourinary   Genital herpes     Other   S/P CABG x 3: LIMA-LAD, SVG-PDA, SVG-OM (Chronic)   Relevant Orders   Comprehensive metabolic panel with GFR   Lipid panel   AMB Referral VBCI Care Management   Anxiety   Relevant Medications   traZODone  (DESYREL ) 100 MG tablet   cariprazine (VRAYLAR) 1.5 MG capsule   Major depression, recurrent, chronic   Relevant Medications   traZODone  (DESYREL ) 100 MG tablet   cariprazine (VRAYLAR) 1.5 MG capsule   Insomnia due to mental disorder  Relevant Medications   traZODone  (DESYREL ) 100 MG tablet   Other Visit Diagnoses       Need for influenza vaccination    -  Primary   Relevant Orders   Flu vaccine trivalent PF, 6mos and older(Flulaval,Afluria,Fluarix,Fluzone) (Completed)     Elevated liver enzymes       Relevant Orders   Comprehensive metabolic panel with GFR     Encounter for screening mammogram for malignant neoplasm of breast       Relevant Orders   MM 3D SCREENING MAMMOGRAM BILATERAL BREAST        Assessment and Plan Assessment & Plan Coronary artery disease status post CABG Coronary artery disease managed with aspirin  and carvedilol . Recent blood work requested by cardiologist for cholesterol monitoring. - Continue aspirin  81 mg daily - Continue carvedilol  6.25 mg twice daily  Type 2 diabetes mellitus Type 2 diabetes managed with Farxiga  and Rybelsus . Last A1c in April was 7.3. Rybelsus  became expensive last month, affecting adherence. - Ordered point-of-care A1c  test - Involved pharmacist to explore affordability options for Rybelsus   Major depressive disorder and anxiety disorder Recurrent depression and anxiety with recent exacerbation post-surgery. Symptoms include lack of energy, irritability, and worry. Currently on Cymbalta  and trazodone . Trazodone  refill needed. - Added new medication to Cymbalta  regimen -start vraylar,  consider decreasing Cymbalta  dose at next appointment - Refilled trazodone  prescription  Chronic obstructive pulmonary disease (COPD) COPD managed with albuterol  inhaler as needed. - Continue albuterol  inhaler as needed  Hyperlipidemia Recent blood work requested by cardiologist for cholesterol monitoring. - Continue rosuvastatin  40 mg daily - Sent blood work results to cardiologist  Hypertension - Continue lisinopril  2.5 mg daily BP Readings from Last 3 Encounters:  04/02/24 118/84  12/31/23 126/76  10/31/23 108/74     Gastroesophageal reflux disease (GERD) -not on medication, doing well  Allergic rhinitis managed with : - Continue Zyrtec 10 mg daily as needed - Continue Flonase  as needed  Genital herpes simplex - Continue Valtrex  1000 mg daily  Elevated liver enzymes Requires re-evaluation. - Ordered liver enzyme tests  General Health Maintenance Overdue for mammogram. Flu shot already administered. - Provided mammogram scheduling card        Follow up plan: Return in about 4 weeks (around 04/30/2024) for follow up.

## 2024-04-02 NOTE — Telephone Encounter (Signed)
 Pharmacy Patient Advocate Encounter   Received notification from CoverMyMeds that prior authorization for Vraylar 1.5MG  capsules is required/requested.   Insurance verification completed.   The patient is insured through [facility].   Per test claim: PA required and submitted KEY/EOC/Request #: [id number] from 04/02/24 to 04/02/25. Ran test claim, Copay is $0.00. This test claim was processed through [facility]- copay amounts may vary at other pharmacies due to pharmacy/plan contracts, or as the patient moves through the different stages of their insurance plan.

## 2024-04-03 ENCOUNTER — Ambulatory Visit: Payer: Self-pay | Admitting: Nurse Practitioner

## 2024-04-03 LAB — COMPREHENSIVE METABOLIC PANEL WITH GFR
AG Ratio: 1.7 (calc) (ref 1.0–2.5)
ALT: 38 U/L — ABNORMAL HIGH (ref 6–29)
AST: 29 U/L (ref 10–35)
Albumin: 4.5 g/dL (ref 3.6–5.1)
Alkaline phosphatase (APISO): 80 U/L (ref 31–125)
BUN: 9 mg/dL (ref 7–25)
CO2: 25 mmol/L (ref 20–32)
Calcium: 9.4 mg/dL (ref 8.6–10.2)
Chloride: 103 mmol/L (ref 98–110)
Creat: 0.59 mg/dL (ref 0.50–0.99)
Globulin: 2.7 g/dL (ref 1.9–3.7)
Glucose, Bld: 172 mg/dL — ABNORMAL HIGH (ref 65–99)
Potassium: 4.2 mmol/L (ref 3.5–5.3)
Sodium: 139 mmol/L (ref 135–146)
Total Bilirubin: 0.5 mg/dL (ref 0.2–1.2)
Total Protein: 7.2 g/dL (ref 6.1–8.1)
eGFR: 111 mL/min/1.73m2 (ref >=60)

## 2024-04-03 LAB — CBC WITH DIFFERENTIAL/PLATELET
Absolute Lymphocytes: 2490 {cells}/uL (ref 850–3900)
Absolute Monocytes: 314 {cells}/uL (ref 200–950)
Basophils Absolute: 58 {cells}/uL (ref 0–200)
Basophils Relative: 0.9 %
Eosinophils Absolute: 109 {cells}/uL (ref 15–500)
Eosinophils Relative: 1.7 %
HCT: 48.5 % — ABNORMAL HIGH (ref 35.0–45.0)
Hemoglobin: 16.5 g/dL — ABNORMAL HIGH (ref 11.7–15.5)
MCH: 31.5 pg (ref 27.0–33.0)
MCHC: 34 g/dL (ref 32.0–36.0)
MCV: 92.6 fL (ref 80.0–100.0)
MPV: 11.4 fL (ref 7.5–12.5)
Monocytes Relative: 4.9 %
Neutro Abs: 3430 {cells}/uL (ref 1500–7800)
Neutrophils Relative %: 53.6 %
Platelets: 149 Thousand/uL (ref 140–400)
RBC: 5.24 Million/uL — ABNORMAL HIGH (ref 3.80–5.10)
RDW: 12.3 % (ref 11.0–15.0)
Total Lymphocyte: 38.9 %
WBC: 6.4 Thousand/uL (ref 3.8–10.8)

## 2024-04-03 LAB — LIPID PANEL
Cholesterol: 140 mg/dL (ref <200)
HDL: 35 mg/dL — ABNORMAL LOW (ref >=50)
LDL Cholesterol (Calc): 73 mg/dL
Non-HDL Cholesterol (Calc): 105 mg/dL (ref <130)
Total CHOL/HDL Ratio: 4 (calc) (ref <5.0)
Triglycerides: 232 mg/dL — ABNORMAL HIGH (ref <150)

## 2024-04-03 LAB — HEMOGLOBIN A1C
Hgb A1c MFr Bld: 9.9 % — ABNORMAL HIGH (ref <5.7)
Mean Plasma Glucose: 237 mg/dL
eAG (mmol/L): 13.2 mmol/L

## 2024-04-06 ENCOUNTER — Telehealth: Payer: Self-pay

## 2024-04-06 NOTE — Progress Notes (Signed)
 Complex Care Management Note  Care Guide Note 04/06/2024 Name: GOLDA ZAVALZA MRN: [id number] DOB: 09-Aug-1975  BRYNNLEE CUMPIAN is a 48 y.o. year old female who sees Leavy Mole, PA-C for primary care. I reached out to [name] by phone today to offer complex care management services.  Ms. Seiple was given information about Complex Care Management services today including:   The Complex Care Management services include support from the care team which includes your Nurse Care Manager, Clinical Social Worker, or Pharmacist.  The Complex Care Management team is here to help remove barriers to the health concerns and goals most important to you. Complex Care Management services are voluntary, and the patient may decline or stop services at any time by request to their care team member.   Complex Care Management Consent Status: Patient agreed to services and verbal consent obtained.   Follow up plan:  Telephone appointment with complex care management team member scheduled for:  04/13/24 at 3:00 p.m.   Encounter Outcome:  Patient Scheduled  [facility]  [facility], [facility] VBCI Assistant Direct Dial: [phone]  Fax: [phone]

## 2024-04-12 ENCOUNTER — Other Ambulatory Visit: Payer: Self-pay | Admitting: Family Medicine

## 2024-04-12 DIAGNOSIS — E1165 Type 2 diabetes mellitus with hyperglycemia: Secondary | ICD-10-CM

## 2024-04-13 ENCOUNTER — Other Ambulatory Visit (INDEPENDENT_AMBULATORY_CARE_PROVIDER_SITE_OTHER)

## 2024-04-13 DIAGNOSIS — E785 Hyperlipidemia, unspecified: Secondary | ICD-10-CM

## 2024-04-13 DIAGNOSIS — F339 Major depressive disorder, recurrent, unspecified: Secondary | ICD-10-CM

## 2024-04-13 DIAGNOSIS — F419 Anxiety disorder, unspecified: Secondary | ICD-10-CM

## 2024-04-13 DIAGNOSIS — E1169 Type 2 diabetes mellitus with other specified complication: Secondary | ICD-10-CM

## 2024-04-13 DIAGNOSIS — I10 Essential (primary) hypertension: Secondary | ICD-10-CM

## 2024-04-13 DIAGNOSIS — J449 Chronic obstructive pulmonary disease, unspecified: Secondary | ICD-10-CM

## 2024-04-13 DIAGNOSIS — F5105 Insomnia due to other mental disorder: Secondary | ICD-10-CM

## 2024-04-13 DIAGNOSIS — Z8619 Personal history of other infectious and parasitic diseases: Secondary | ICD-10-CM

## 2024-04-13 DIAGNOSIS — E1165 Type 2 diabetes mellitus with hyperglycemia: Secondary | ICD-10-CM

## 2024-04-13 NOTE — Progress Notes (Unsigned)
 S:     Reason for visit: ?  Lauren [name] is a 48 y.o. female with a history of diabetes (type 2), who presents today for an initial diabetes Telephone pharmacotherapy visit.? Pertinent PMH also includes GERD, COPD, HTN, CAD, HLD.  Care Team: Primary Care Provider: Gareth Clarity  At last visit with PCP on 04/02/24, A1c was elevated at 9.9%. Patient reported non-adherence to Rybelsus  d/t cost on insurance.  Current diabetes medications include: Farxiga  10 mg daily, Rybelsus  14 mg daily  Previous diabetes medications include: metformin  (GI distress), Januvia , Ozempic  (nauseous) Current hypertension medications include: carvedilol  6.25 mg BID, lisinopril  2.5 mg daily Current hyperlipidemia medications include: rosuvastatin  40 mg daily, Nexlizet  180/10 mg daily  Patient reports adherence to taking all medications as prescribed.  *** Patient denies adherence with medications, reports missing *** medications *** times per week, on average.  Have you been experiencing any side effects to the medications prescribed? no Do you have any problems obtaining medications due to transportation or finances? yes Insurance coverage: Paramedic and secondary Medicaid  Patient denies hypoglycemic events.  Reported home fasting blood sugars: 120 mg/dL  Reported 2 hour post-meal/random blood sugars: 150-170 mg/dL  Patient denies nocturia (nighttime urination).   DM Prevention:  Statin: Taking; high intensity.?  History of chronic kidney disease? no History of albuminuria? no Last eye exam: 06/2023 Lab Results  Component Value Date   HMDIABEYEEXA Retinopathy (A) 05/25/2021   Tobacco Use:  Tobacco Use: High Risk (04/02/2024)   Patient History    Smoking Tobacco Use: Some Days    Smokeless Tobacco Use: Never    Passive Exposure: Not on file   O:  Vitals:  Wt Readings from Last 3 Encounters:  04/02/24 178 lb (80.7 kg)  12/31/23 179 lb 6.4 oz (81.4 kg)  10/31/23 174 lb 3.2 oz (79 kg)    BP Readings from Last 3 Encounters:  04/02/24 118/84  12/31/23 126/76  10/31/23 108/74   Pulse Readings from Last 3 Encounters:  04/02/24 60  12/31/23 81  10/31/23 74     Labs:?  Lab Results  Component Value Date   HGBA1C 9.9 (H) 04/02/2024   HGBA1C 7.3 (H) 09/30/2023   HGBA1C 7.2 (H) 06/17/2023   GLUCOSE 172 (H) 04/02/2024   MICRALBCREAT NOTE 09/30/2023   MICRALBCREAT 7 08/31/2022   MICRALBCREAT 4 05/19/2021   CREATININE 0.59 04/02/2024   CREATININE 0.57 12/31/2023   CREATININE 0.67 09/30/2023    Lab Results  Component Value Date   CHOL 140 04/02/2024   LDLCALC 73 04/02/2024   LDLCALC 77 10/31/2023   LDLCALC 77 06/17/2023   HDL 35 (L) 04/02/2024   TRIG 232 (H) 04/02/2024   TRIG 136 10/31/2023   TRIG 112 06/17/2023   ALT 38 (H) 04/02/2024   ALT 46 (H) 12/31/2023   AST 29 04/02/2024   AST 44 (H) 12/31/2023      Chemistry      Component Value Date/Time   NA 139 04/02/2024 1408   NA 137 10/15/2022 1526   NA 138 05/25/2014 0926   K 4.2 04/02/2024 1408   K 4.1 05/25/2014 0926   CL 103 04/02/2024 1408   CL 107 05/25/2014 0926   CO2 25 04/02/2024 1408   CO2 25 05/25/2014 0926   BUN 9 04/02/2024 1408   BUN 12 10/15/2022 1526   BUN 11 05/25/2014 0926   CREATININE 0.59 04/02/2024 1408   GLU 129 07/01/2014 0000      Component Value Date/Time  CALCIUM  9.4 04/02/2024 1408   CALCIUM  8.6 05/25/2014 0926   ALKPHOS 69 10/31/2023 0848   ALKPHOS 68 05/25/2014 0926   AST 29 04/02/2024 1408   AST 28 05/25/2014 0926   ALT 38 (H) 04/02/2024 1408   ALT 40 05/25/2014 0926   BILITOT 0.5 04/02/2024 1408   BILITOT 0.4 10/31/2023 0848   BILITOT 0.3 05/25/2014 0926       The 10-year ASCVD risk score (Arnett DK, et al., 2019) is: 7.6%  Lab Results  Component Value Date   MICRALBCREAT NOTE 09/30/2023   MICRALBCREAT 7 08/31/2022   MICRALBCREAT 4 05/19/2021   MICRALBCREAT 7 12/03/2017   MICRALBCREAT NEG 03/16/2016    A/P: Diabetes currently uncontrolled with  a most recent A1c of 9.9% on 04/02/24, which is up from 7.3% on 09/30/23. Patient is able to verbalize appropriate hypoglycemia management plan. Medication adherence appears appropriate currently, however, there have been some issues with the pharmacy not utilizing both prescription insurance plans leading to a high copay and non-adherence. Reported home BG readings are currently at goal since restarting therapies. Pharmacy representative stated that they cannot remember to always use both insurance plans and the patient will need to remind them. Additionally, they reported that they were unable to re-bill prescription from July 2025 that were incorrectly billed to insurance. Patient would like to use [facility] pharmacies moving forward.  -Continued GLP-1 Rybelsus  (semaglutide ) 14 mg daily.  -Continued SGLT2-I Farxiga  (dapagliflozin )10 mg daily.  -Extensively discussed pathophysiology of diabetes, recommended lifestyle interventions, dietary effects on blood sugar control.  -Counseled on s/sx of and management of hypoglycemia.  -Next A1c anticipated 06/2024.   ASCVD risk - secondary prevention in patient with diabetes. Last LDL is 73 mg/dL, not at goal of <44 mg/dL. Patient is not currently taking Nexlizet  therapy. Will restart at this time.  -Continued rosuvastatin  40 mg daily.  -Restarted Nexlizet  180/10 mg daily   Patient verbalized understanding of treatment plan. Total time patient counseling 40 minutes.  Follow-up:  Pharmacist on 06/01/24 PCP clinic visit on 05/01/24  Peyton CHARLENA Ferries, PharmD Clinical Pharmacist [facility] [phone]

## 2024-04-14 ENCOUNTER — Encounter: Payer: Self-pay | Admitting: Pharmacist

## 2024-04-14 ENCOUNTER — Telehealth: Payer: Self-pay | Admitting: Pharmacy Technician

## 2024-04-14 ENCOUNTER — Telehealth: Payer: Self-pay

## 2024-04-14 ENCOUNTER — Other Ambulatory Visit: Payer: Self-pay | Admitting: Nurse Practitioner

## 2024-04-14 ENCOUNTER — Other Ambulatory Visit: Payer: Self-pay

## 2024-04-14 ENCOUNTER — Other Ambulatory Visit (HOSPITAL_COMMUNITY): Payer: Self-pay

## 2024-04-14 MED ORDER — LISINOPRIL 2.5 MG PO TABS
2.5000 mg | ORAL_TABLET | Freq: Every day | ORAL | 1 refills | Status: AC
  Filled 2024-04-14 – 2024-05-19 (×3): qty 90, 90d supply, fill #0

## 2024-04-14 MED ORDER — RYBELSUS 14 MG PO TABS
14.0000 mg | ORAL_TABLET | Freq: Every day | ORAL | 1 refills | Status: DC
  Filled 2024-04-14: qty 90, 90d supply, fill #0
  Filled 2024-04-14: qty 30, 30d supply, fill #0
  Filled 2024-04-14: qty 90, 90d supply, fill #0
  Filled 2024-05-07: qty 30, 30d supply, fill #0
  Filled 2024-05-07 – 2024-06-01 (×5): qty 90, 90d supply, fill #0

## 2024-04-14 MED ORDER — NEXLIZET 180-10 MG PO TABS
1.0000 | ORAL_TABLET | Freq: Every day | ORAL | 0 refills | Status: DC
  Filled 2024-04-14 – 2024-05-01 (×5): qty 90, 90d supply, fill #0

## 2024-04-14 MED ORDER — DULOXETINE HCL 60 MG PO CPEP
60.0000 mg | ORAL_CAPSULE | Freq: Two times a day (BID) | ORAL | 1 refills | Status: AC
  Filled 2024-04-14 – 2024-06-26 (×3): qty 180, 90d supply, fill #0

## 2024-04-14 MED ORDER — CARIPRAZINE HCL 1.5 MG PO CAPS
1.5000 mg | ORAL_CAPSULE | Freq: Every day | ORAL | 0 refills | Status: DC
Start: 2024-04-14 — End: 2024-05-01
  Filled 2024-04-14: qty 30, 30d supply, fill #0

## 2024-04-14 MED ORDER — DAPAGLIFLOZIN PROPANEDIOL 10 MG PO TABS
10.0000 mg | ORAL_TABLET | Freq: Every day | ORAL | 1 refills | Status: AC
Start: 2024-04-14 — End: ?
  Filled 2024-04-14 – 2024-05-07 (×4): qty 90, 90d supply, fill #0

## 2024-04-14 MED ORDER — ROSUVASTATIN CALCIUM 40 MG PO TABS
40.0000 mg | ORAL_TABLET | Freq: Every day | ORAL | 1 refills | Status: AC
  Filled 2024-04-14 – 2024-06-26 (×3): qty 90, 90d supply, fill #0

## 2024-04-14 MED ORDER — TRAZODONE HCL 100 MG PO TABS
100.0000 mg | ORAL_TABLET | Freq: Every day | ORAL | 1 refills | Status: AC
  Filled 2024-04-14 – 2024-06-26 (×3): qty 90, 90d supply, fill #0

## 2024-04-14 MED ORDER — ALBUTEROL SULFATE HFA 108 (90 BASE) MCG/ACT IN AERS
2.0000 | INHALATION_SPRAY | Freq: Four times a day (QID) | RESPIRATORY_TRACT | 2 refills | Status: AC | PRN
  Filled 2024-04-14: qty 6.7, 30d supply, fill #0
  Filled 2024-04-14 – 2024-05-07 (×3): qty 6.7, 25d supply, fill #0
  Filled 2024-06-26: qty 6.7, 25d supply, fill #1

## 2024-04-14 MED ORDER — VALACYCLOVIR HCL 1 G PO TABS
1000.0000 mg | ORAL_TABLET | Freq: Every day | ORAL | 11 refills | Status: AC
  Filled 2024-04-14 – 2024-06-26 (×2): qty 30, 30d supply, fill #0

## 2024-04-14 MED ORDER — CARVEDILOL 6.25 MG PO TABS
6.2500 mg | ORAL_TABLET | Freq: Two times a day (BID) | ORAL | 1 refills | Status: AC
  Filled 2024-04-14 – 2024-06-26 (×4): qty 180, 90d supply, fill #0

## 2024-04-14 NOTE — Telephone Encounter (Signed)
 Pharmacy Patient Advocate Encounter   Received notification from CoverMyMeds that prior authorization for Nexlizet  180-10MG  tablets is required/requested.   Insurance verification completed.   The patient is insured through [facility].   Per test claim: PA required; PA submitted to above mentioned insurance via Latent Key/confirmation #/EOC [facility] Status is pending

## 2024-04-14 NOTE — Telephone Encounter (Signed)
 Duplicate request, refilled 04/14/24.  Requested Prescriptions  Pending Prescriptions Disp Refills   RYBELSUS  14 MG TABS [Pharmacy Med Name: RYBELSUS  14 MG TABLET] 30 tablet 2    Sig: TAKE 1 TABLET (14 MG TOTAL) BY MOUTH DAILY     Off-Protocol Failed - 04/14/2024 11:40 AM      Failed - Medication not assigned to a protocol, review manually.      Passed - Valid encounter within last 12 months    Recent Outpatient Visits           1 week ago Need for influenza vaccination   [facility] Gareth Mliss FALCON, FNP   3 months ago Dizziness   [facility] Gareth Mliss F, FNP   6 months ago Type 2 diabetes mellitus with hyperglycemia, without long-term current use of insulin  [facility])   [facility] Leavy Mole, PA-C   7 months ago Annual physical exam   [facility] Bernardo Fend, [place]

## 2024-04-15 ENCOUNTER — Other Ambulatory Visit (HOSPITAL_COMMUNITY): Payer: Self-pay

## 2024-04-15 NOTE — Telephone Encounter (Signed)
 Pharmacy Patient Advocate Encounter  Received notification from [facility] MEDICAID that Prior Authorization for Nexlizet  180-10MG  tablets has been DENIED.  Full denial letter will be uploaded to the media tab. See denial reason below.    PA #/Case ID/Reference #: [id number]  **THIS DENIAL WAS FROM HER SECONDARY COVERAGE, SHE IS STILL ABLE TO GET NEXLIZET  THROUGH HER PRIMARY INSURANCE FOR $25.00 COPAY FOR 3 MONTHS**

## 2024-04-24 ENCOUNTER — Other Ambulatory Visit: Payer: Self-pay

## 2024-04-25 ENCOUNTER — Telehealth: Admitting: Physician Assistant

## 2024-04-25 DIAGNOSIS — R10A0 Flank pain, unspecified side: Secondary | ICD-10-CM

## 2024-04-25 NOTE — Patient Instructions (Signed)
 Lauren [name], thank you for joining [name], PA-C for today's virtual visit.  While this provider is not your primary care provider (PCP), if your PCP is located in our provider database this encounter information will be shared with them immediately following your visit.   A [facility] MyChart account gives you access to today's visit and all your visits, tests, and labs performed at [facility]  click here if you don't have a [facility] MyChart account or go to [email or web address]  Consent: (Patient) Lauren [name] provided verbal consent for this virtual visit at the beginning of the encounter.  Current Medications:  Current Outpatient Medications:    acetaminophen  (TYLENOL ) 500 MG tablet, Take 1 tablet (500 mg total) by mouth every 6 (six) hours as needed., Disp: 30 tablet, Rfl: 0   albuterol  (VENTOLIN  HFA) 108 (90 Base) MCG/ACT inhaler, Inhale 2 puffs into the lungs every 6 (six) hours as needed for wheezing or shortness of breath., Disp: 6.7 g, Rfl: 2   aspirin  EC 81 MG tablet, Take 1 tablet (81 mg total) by mouth daily. Swallow whole., Disp: 90 tablet, Rfl: 3   b complex vitamins capsule, Take 1 capsule by mouth daily. (Patient not taking: Reported on 04/13/2024), Disp: , Rfl:    Bempedoic Acid-Ezetimibe (NEXLIZET ) 180-10 MG TABS, Take 1 tablet by mouth daily., Disp: 90 tablet, Rfl: 0   cariprazine (VRAYLAR) 1.5 MG capsule, Take 1 capsule (1.5 mg total) by mouth daily., Disp: 30 capsule, Rfl: 0   carvedilol  (COREG ) 6.25 MG tablet, Take 1 tablet (6.25 mg total) by mouth 2 (two) times daily., Disp: 180 tablet, Rfl: 1   cetirizine (ZYRTEC) 10 MG tablet, Take 10 mg by mouth daily as needed for allergies., Disp: , Rfl:    dapagliflozin  propanediol (FARXIGA ) 10 MG TABS tablet, Take 1 tablet (10 mg total) by mouth daily., Disp: 90 tablet, Rfl: 1   DULoxetine  (CYMBALTA ) 60 MG capsule, Take 1 capsule (60 mg total) by mouth 2 (two) times daily., Disp: 180 capsule, Rfl:  1   fluticasone  (FLONASE ) 50 MCG/ACT nasal spray, Place 1 spray into both nostrils daily as needed for allergies. (Patient not taking: Reported on 04/13/2024), Disp: , Rfl:    lisinopril  (ZESTRIL ) 2.5 MG tablet, Take 1 tablet (2.5 mg total) by mouth daily., Disp: 90 tablet, Rfl: 1   nitroGLYCERIN  (NITROSTAT ) 0.3 MG SL tablet, PLACE 1 TABLET UNDER THE TONGUE EVERY 5 MINUTES AS NEEDED FOR CHEST PAIN., Disp: 100 tablet, Rfl: 0   rosuvastatin  (CRESTOR ) 40 MG tablet, Take 1 tablet (40 mg total) by mouth daily., Disp: 90 tablet, Rfl: 1   Semaglutide  (RYBELSUS ) 14 MG TABS, Take 1 tablet (14 mg total) by mouth daily., Disp: 90 tablet, Rfl: 1   traZODone  (DESYREL ) 100 MG tablet, Take 1 tablet (100 mg total) by mouth at bedtime., Disp: 90 tablet, Rfl: 1   valACYclovir  (VALTREX ) 1000 MG tablet, Take 1 tablet (1,000 mg total) by mouth daily. Take as needed for herpes outbreak., Disp: 30 tablet, Rfl: 11   Medications ordered in this encounter:  No orders of the defined types were placed in this encounter.    *If you need refills on other medications prior to your next appointment, please contact your pharmacy*  Follow-Up: Call back or seek an in-person evaluation if the symptoms worsen or if the condition fails to improve as anticipated.  [facility] [phone]  Other Instructions Report to ER.    If you have been instructed to  have an in-person evaluation today at a local Urgent Care facility, please use the link below. It will take you to a list of all of our available [facility], including address, phone number and hours of operation. Please do not delay care.  [facility]  If you or a family member do not have a primary care provider, use the link below to schedule a visit and establish care. When you choose a [facility] primary care physician or advanced practice provider, you gain a long-term partner in health. Find a Primary Care Provider  Learn  more about [facility]'s in-office and virtual care options: [facility] - Get Care Now

## 2024-04-25 NOTE — Progress Notes (Signed)
 Virtual Visit Consent   Lauren [name], you are scheduled for a virtual visit with a [facility] provider today. Just as with appointments in the office, your consent must be obtained to participate. Your consent will be active for this visit and any virtual visit you may have with one of our providers in the next 365 days. If you have a MyChart account, a copy of this consent can be sent to you electronically.  As this is a virtual visit, video technology does not allow for your provider to perform a traditional examination. This may limit your provider's ability to fully assess your condition. If your provider identifies any concerns that need to be evaluated in person or the need to arrange testing (such as labs, EKG, etc.), we will make arrangements to do so. Although advances in technology are sophisticated, we cannot ensure that it will always work on either your end or our end. If the connection with a video visit is poor, the visit may have to be switched to a telephone visit. With either a video or telephone visit, we are not always able to ensure that we have a secure connection.  By engaging in this virtual visit, you consent to the provision of healthcare and authorize for your insurance to be billed (if applicable) for the services provided during this visit. Depending on your insurance coverage, you may receive a charge related to this service.  I need to obtain your verbal consent now. Are you willing to proceed with your visit today? Lauren [name] has provided verbal consent on 04/25/2024 for a virtual visit (video or telephone). [name], [place]  Date: 04/25/2024 3:06 PM   Virtual Visit via Video Note   I, [name], connected with  Lauren [name]  ([id number], 49/30/1977) on 04/25/24 at  3:15 PM EST by a video-enabled telemedicine application and verified that I am speaking with the correct person using two identifiers.  Location: Patient: Virtual Visit Location Patient:  Home Provider: Virtual Visit Location Provider: Home Office   I discussed the limitations of evaluation and management by telemedicine and the availability of in person appointments. The patient expressed understanding and agreed to proceed.    History of Present Illness: Lauren [name] is a 48 y.o. who identifies as a female who was assigned female at birth, and is being seen today for flank pain and suspected kidney infection.  HPI: Urinary Tract Infection  This is a new problem. The current episode started in the past 7 days. The problem occurs every urination. The pain is moderate. There has been no fever. There is A history of pyelonephritis. Associated symptoms include flank pain and urgency. She has tried nothing for the symptoms. Her past medical history is significant for recurrent UTIs.    Problems:  Patient Active Problem List   Diagnosis Date Noted   Nocturnal leg cramps 10/31/2023   Insomnia due to mental disorder 06/17/2023   S/P CABG x 3: LIMA-LAD, SVG-PDA, SVG-OM 10/22/2022   Coronary artery disease involving native coronary artery without angina pectoris 10/19/2022   Chronic obstructive pulmonary disease, unspecified COPD type (HCC) 10/15/2022   Type 2 diabetes mellitus with hyperglycemia, without long-term current use of insulin  (HCC) 08/18/2021   Polyp of transverse colon    Polyp of sigmoid colon    Essential hypertension 05/19/2018   Tobacco dependence 05/18/2018   Allergic rhinitis, seasonal 04/23/2017   Genital herpes 04/23/2017   Major depression, recurrent, chronic 05/17/2016   Cholesteatoma of  attic of right ear 12/05/2015   GERD (gastroesophageal reflux disease) 11/29/2015   Vitamin D  deficiency 12/13/2014   Anxiety 12/13/2014   Hyperlipidemia associated with type 2 diabetes mellitus (HCC) 05/26/2014   Smoker 05/26/2014    Allergies:  Allergies  Allergen Reactions   Cephalexin  Other (See Comments)    Secondary yeast infection   Metformin  And Related  Nausea And Vomiting    With all doses and regular and extended release forms   Medications:  Current Outpatient Medications:    acetaminophen  (TYLENOL ) 500 MG tablet, Take 1 tablet (500 mg total) by mouth every 6 (six) hours as needed., Disp: 30 tablet, Rfl: 0   albuterol  (VENTOLIN  HFA) 108 (90 Base) MCG/ACT inhaler, Inhale 2 puffs into the lungs every 6 (six) hours as needed for wheezing or shortness of breath., Disp: 6.7 g, Rfl: 2   aspirin  EC 81 MG tablet, Take 1 tablet (81 mg total) by mouth daily. Swallow whole., Disp: 90 tablet, Rfl: 3   b complex vitamins capsule, Take 1 capsule by mouth daily. (Patient not taking: Reported on 04/13/2024), Disp: , Rfl:    Bempedoic Acid-Ezetimibe (NEXLIZET ) 180-10 MG TABS, Take 1 tablet by mouth daily., Disp: 90 tablet, Rfl: 0   cariprazine (VRAYLAR) 1.5 MG capsule, Take 1 capsule (1.5 mg total) by mouth daily., Disp: 30 capsule, Rfl: 0   carvedilol  (COREG ) 6.25 MG tablet, Take 1 tablet (6.25 mg total) by mouth 2 (two) times daily., Disp: 180 tablet, Rfl: 1   cetirizine (ZYRTEC) 10 MG tablet, Take 10 mg by mouth daily as needed for allergies., Disp: , Rfl:    dapagliflozin  propanediol (FARXIGA ) 10 MG TABS tablet, Take 1 tablet (10 mg total) by mouth daily., Disp: 90 tablet, Rfl: 1   DULoxetine  (CYMBALTA ) 60 MG capsule, Take 1 capsule (60 mg total) by mouth 2 (two) times daily., Disp: 180 capsule, Rfl: 1   fluticasone  (FLONASE ) 50 MCG/ACT nasal spray, Place 1 spray into both nostrils daily as needed for allergies. (Patient not taking: Reported on 04/13/2024), Disp: , Rfl:    lisinopril  (ZESTRIL ) 2.5 MG tablet, Take 1 tablet (2.5 mg total) by mouth daily., Disp: 90 tablet, Rfl: 1   nitroGLYCERIN  (NITROSTAT ) 0.3 MG SL tablet, PLACE 1 TABLET UNDER THE TONGUE EVERY 5 MINUTES AS NEEDED FOR CHEST PAIN., Disp: 100 tablet, Rfl: 0   rosuvastatin  (CRESTOR ) 40 MG tablet, Take 1 tablet (40 mg total) by mouth daily., Disp: 90 tablet, Rfl: 1   Semaglutide  (RYBELSUS ) 14 MG  TABS, Take 1 tablet (14 mg total) by mouth daily., Disp: 90 tablet, Rfl: 1   traZODone  (DESYREL ) 100 MG tablet, Take 1 tablet (100 mg total) by mouth at bedtime., Disp: 90 tablet, Rfl: 1   valACYclovir  (VALTREX ) 1000 MG tablet, Take 1 tablet (1,000 mg total) by mouth daily. Take as needed for herpes outbreak., Disp: 30 tablet, Rfl: 11  Observations/Objective: Patient is well-developed, well-nourished in no acute distress.  Resting comfortably  at home.  Head is normocephalic, atraumatic.  No labored breathing.  Speech is clear and coherent with logical content.  Patient is alert and oriented at baseline.    Assessment and Plan: 1. Flank pain, unspecified laterality (Primary)  Patient presenting with worsening flank pain and now suspects kidney infection. I advised the patient that she would have to present in person to the nearest ER. She stated she wasn't going and I shared that on today my recommendation is an in person evaluation. All questions were answered at the time of  the visit.   Follow Up Instructions: I discussed the assessment and treatment plan with the patient. The patient was provided an opportunity to ask questions and all were answered. The patient agreed with the plan and demonstrated an understanding of the instructions.  A copy of instructions were sent to the patient via MyChart unless otherwise noted below.    The patient was advised to call back or seek an in-person evaluation if the symptoms worsen or if the condition fails to improve as anticipated.    [name], PA-C

## 2024-04-27 ENCOUNTER — Ambulatory Visit (INDEPENDENT_AMBULATORY_CARE_PROVIDER_SITE_OTHER): Admitting: Internal Medicine

## 2024-04-27 ENCOUNTER — Encounter: Payer: Self-pay | Admitting: Internal Medicine

## 2024-04-27 ENCOUNTER — Other Ambulatory Visit (HOSPITAL_COMMUNITY)
Admission: RE | Admit: 2024-04-27 | Discharge: 2024-04-27 | Disposition: A | Discharge status: Home / Self Care | Source: Ambulatory Visit | Attending: Internal Medicine | Admitting: Internal Medicine

## 2024-04-27 ENCOUNTER — Other Ambulatory Visit: Payer: Self-pay

## 2024-04-27 VITALS — BP 128/80 | HR 84 | Temp 98.0°F | Resp 16 | Ht 64.0 in | Wt 172.3 lb

## 2024-04-27 DIAGNOSIS — N3090 Cystitis, unspecified without hematuria: Secondary | ICD-10-CM | POA: Diagnosis not present

## 2024-04-27 DIAGNOSIS — R350 Frequency of micturition: Secondary | ICD-10-CM | POA: Diagnosis not present

## 2024-04-27 DIAGNOSIS — Z113 Encounter for screening for infections with a predominantly sexual mode of transmission: Secondary | ICD-10-CM

## 2024-04-27 LAB — POCT URINALYSIS DIPSTICK
Bilirubin, UA: NEGATIVE
Glucose, UA: POSITIVE — AB
Ketones, UA: NEGATIVE
Protein, UA: POSITIVE — AB
Spec Grav, UA: 1.02 (ref 1.010–1.025)
Urobilinogen, UA: 0.2 U/dL
pH, UA: 5 (ref 5.0–8.0)

## 2024-04-27 MED ORDER — SULFAMETHOXAZOLE-TRIMETHOPRIM 800-160 MG PO TABS: 1.0000 | ORAL_TABLET | Freq: Two times a day (BID) | ORAL | 0 refills | Status: AC

## 2024-04-27 NOTE — Progress Notes (Signed)
 Acute Office Visit  Subjective:     Patient ID: Lauren [name], female    DOB: 1975-11-19, 48 y.o.   MRN: [id number]  Chief Complaint  Patient presents with   Urinary Frequency    For 5 days    Urinary Frequency  Associated symptoms include flank pain, frequency, hematuria and urgency. Pertinent negatives include no chills.   Patient is in today for left flank pain and hematuria.   Discussed the use of AI scribe software for clinical note transcription with the patient, who gave verbal consent to proceed.  History of Present Illness [name] is a 48 year old female who presents with dysuria and left flank pain.  She has experienced dysuria and left flank pain since Thursday, with persistent pain even when not urinating, accompanied by hematuria. She has urgency with urination but no increased frequency. No fever is present. She has had two urinary tract infections in the past and no kidney stones.   Review of Systems  Constitutional:  Negative for chills and fever.  Genitourinary:  Positive for dysuria, flank pain, frequency, hematuria and urgency.        Objective:    BP 128/80 (Cuff Size: Large)   Pulse 84   Temp 98 F (36.7 C) (Oral)   Resp 16   Ht 5' 4 (1.626 m)   Wt 172 lb 4.8 oz (78.2 kg)   SpO2 97%   BMI 29.58 kg/m    Physical Exam Constitutional:      Appearance: Normal appearance.  HENT:     Head: Normocephalic and atraumatic.  Eyes:     Conjunctiva/sclera: Conjunctivae normal.  Cardiovascular:     Rate and Rhythm: Normal rate and regular rhythm.  Pulmonary:     Effort: Pulmonary effort is normal.     Breath sounds: Normal breath sounds.  Skin:    General: Skin is warm and dry.  Neurological:     General: No focal deficit present.     Mental Status: She is alert. Mental status is at baseline.  Psychiatric:        Mood and Affect: Mood normal.        Behavior: Behavior normal.     Results for orders placed or performed in visit on  04/27/24  POCT urinalysis dipstick  Result Value Ref Range   Color, UA gold    Clarity, UA cloudy    Glucose, UA Positive (A) Negative   Bilirubin, UA negative    Ketones, UA negative    Spec Grav, UA 1.020 1.010 - 1.025   Blood, UA moderate    pH, UA 5.0 5.0 - 8.0   Protein, UA Positive (A) Negative   Urobilinogen, UA 0.2 0.2 or 1.0 E.U./dL   Nitrite, UA small    Leukocytes, UA Moderate (2+) (A) Negative   Appearance cloudy    Odor strong         Assessment & Plan:   Assessment & Plan Acute urinary tract infection with hematuria, urinary urgency, and left flank pain Urinalysis indicated bacterial infection. Differential includes nephrolithiasis or pyelonephritis, though less likely. No fever or history of recurrent UTIs or nephrolithiasis. - Prescribed Bactrim  (sulfamethoxazole  and trimethoprim ) twice daily for three days. - Sent urine for culture to confirm bacterial growth and antibiotic sensitivity. - Advised to report if symptoms persist after completing antibiotics for further evaluation or imaging.  Sexually transmitted infection screening Previous syphilis antibody test was positive with unclear follow-up. Discussed risks of untreated syphilis,  including neurosyphilis. Planned to recheck syphilis status and screen for other STDs. - Ordered full STD panel including gonorrhea, chlamydia, trichomonas, bacterial vaginosis, yeast, HIV, and syphilis screening. - Performed vaginal swab for STD testing. - If syphilis test is positive, will treat with penicillin.  - POCT urinalysis dipstick - Urine Culture - sulfamethoxazole -trimethoprim  (BACTRIM  DS) 800-160 MG tablet; Take 1 tablet by mouth 2 (two) times daily for 3 days.  Dispense: 6 tablet; Refill: 0 - RPR - HIV antibody (with reflex) - Cervicovaginal ancillary only   Return if symptoms worsen or fail to improve.  [name], DO

## 2024-04-28 ENCOUNTER — Other Ambulatory Visit: Payer: Self-pay

## 2024-04-28 ENCOUNTER — Ambulatory Visit: Payer: Self-pay | Admitting: Internal Medicine

## 2024-04-28 DIAGNOSIS — B9689 Other specified bacterial agents as the cause of diseases classified elsewhere: Secondary | ICD-10-CM

## 2024-04-28 DIAGNOSIS — B379 Candidiasis, unspecified: Secondary | ICD-10-CM

## 2024-04-28 LAB — CERVICOVAGINAL ANCILLARY ONLY
Bacterial Vaginitis (gardnerella): POSITIVE — AB
Candida Glabrata: POSITIVE — AB
Candida Vaginitis: NEGATIVE
Chlamydia: NEGATIVE
Comment: NEGATIVE
Comment: NEGATIVE
Comment: NEGATIVE
Comment: NEGATIVE
Comment: NEGATIVE
Comment: NORMAL
Neisseria Gonorrhea: NEGATIVE
Trichomonas: NEGATIVE

## 2024-04-28 MED ORDER — METRONIDAZOLE 500 MG PO TABS
500.0000 mg | ORAL_TABLET | Freq: Two times a day (BID) | ORAL | 0 refills | Status: AC
  Filled 2024-04-28: qty 14, 7d supply, fill #0

## 2024-04-28 MED ORDER — FLUCONAZOLE 150 MG PO TABS
150.0000 mg | ORAL_TABLET | Freq: Once | ORAL | 0 refills | Status: AC
  Filled 2024-04-28: qty 3, 3d supply, fill #0

## 2024-04-29 LAB — URINE CULTURE
MICRO NUMBER:: 17213910
SPECIMEN QUALITY:: ADEQUATE

## 2024-04-29 LAB — RPR: RPR Ser Ql: NONREACTIVE

## 2024-04-29 LAB — HIV ANTIBODY (ROUTINE TESTING W REFLEX)
HIV 1&2 Ab, 4th Generation: NONREACTIVE
HIV FINAL INTERPRETATION: NEGATIVE

## 2024-05-01 ENCOUNTER — Encounter: Payer: Self-pay | Admitting: Nurse Practitioner

## 2024-05-01 ENCOUNTER — Ambulatory Visit (INDEPENDENT_AMBULATORY_CARE_PROVIDER_SITE_OTHER): Admitting: Nurse Practitioner

## 2024-05-01 ENCOUNTER — Other Ambulatory Visit: Payer: Self-pay

## 2024-05-01 VITALS — BP 134/86 | HR 93 | Temp 98.0°F | Ht 64.0 in | Wt 170.0 lb

## 2024-05-01 DIAGNOSIS — F339 Major depressive disorder, recurrent, unspecified: Secondary | ICD-10-CM | POA: Diagnosis not present

## 2024-05-01 DIAGNOSIS — F419 Anxiety disorder, unspecified: Secondary | ICD-10-CM

## 2024-05-01 MED ORDER — CARIPRAZINE HCL 1.5 MG PO CAPS
1.5000 mg | ORAL_CAPSULE | Freq: Every day | ORAL | 1 refills | Status: AC
  Filled 2024-05-01: qty 90, 90d supply, fill #0

## 2024-05-01 NOTE — Progress Notes (Signed)
 BP 134/86   Pulse 93   Temp 98 F (36.7 C)   Ht 5' 4 (1.626 m)   Wt 170 lb (77.1 kg)   SpO2 96%   BMI 29.18 kg/m    Subjective:    Patient ID: Lauren [name], female    DOB: [birth year], 48 y.o.   MRN: [id number]  HPI: Lauren [name] is a 48 y.o. female  Chief Complaint  Patient presents with   Medical Management of Chronic Issues   Discussed the use of AI scribe software for clinical note transcription with the patient, who gave verbal consent to proceed.  History of Present Illness Lauren [name] is a 48 year old female with anxiety and depression who presents for a four week follow-up.  Mood and anxiety symptoms - Anxiety and depression are present. - Currently taking trazodone  100 mg at bedtime, Cymbalta  60 mg twice daily, and Vraylar 1.5 mg daily. - Significant improvement in symptoms since starting current medications. - PHQ-9 score improved from 11 to 3. - GAD score improved from 13 to 3.  Urinary tract infection - Completing a course of antibiotics for urinary tract infection.  Medication access issues - Insurance coverage issue for Rybelsus ; not covered by [facility]. - Current out-of-pocket cost is $45 per month. - Awaiting further instructions regarding medication access. Discussing currently with [facility].          05/01/2024    1:46 PM 04/02/2024    1:47 PM 12/31/2023   10:19 AM  Depression screen PHQ 2/9  Decreased Interest 1 1 0  Down, Depressed, Hopeless 0 2 0  PHQ - 2 Score 1 3 0  Altered sleeping 2 1 0  Tired, decreased energy 0 1 0  Change in appetite 0 2 0  [name] bad or failure about yourself  0 1 0  Trouble concentrating 0 1 0  Moving slowly or fidgety/restless 0 1 0  Suicidal thoughts 0 1 0  PHQ-9 Score 3 11  0   Difficult doing work/chores Not difficult at all Somewhat difficult Not difficult at all     Data saved with a previous flowsheet row definition       05/01/2024    1:46 PM 04/02/2024    1:00 PM  12/31/2023   10:20 AM 03/18/2023    2:30 PM  GAD 7 : Generalized Anxiety Score  Nervous, Anxious, on Edge 0 1 0 0  Control/stop worrying 1 1 0 0  Worry too much - different things 1 1 0 0  Trouble relaxing 1 2 0 0  Restless 0 2 0 0  Easily annoyed or irritable 0 3 0 0  Afraid - awful might happen 0 3 0 0  Total GAD 7 Score 3 13 0 0  Anxiety Difficulty Not difficult at all Very difficult Not difficult at all Not difficult at all     Relevant past medical, surgical, family and social history reviewed and updated as indicated. Interim medical history since our last visit reviewed. Allergies and medications reviewed and updated.  Review of Systems  Constitutional: Negative for fever or weight change.  Respiratory: Negative for cough and shortness of breath.   Cardiovascular: Negative for chest pain or palpitations.  Gastrointestinal: Negative for abdominal pain, no bowel changes.  Musculoskeletal: Negative for gait problem or joint swelling.  Skin: Negative for rash.  Neurological: Negative for dizziness or headache.  No other specific complaints in a complete review of systems (except as listed  in HPI above).      Objective:      BP 134/86   Pulse 93   Temp 98 F (36.7 C)   Ht 5' 4 (1.626 m)   Wt 170 lb (77.1 kg)   SpO2 96%   BMI 29.18 kg/m    Wt Readings from Last 3 Encounters:  05/01/24 170 lb (77.1 kg)  04/27/24 172 lb 4.8 oz (78.2 kg)  04/02/24 178 lb (80.7 kg)    Physical Exam GENERAL: Alert, cooperative, well developed, no acute distress HEENT: Normocephalic, normal oropharynx, moist mucous membranes CHEST: Clear to auscultation bilaterally, No wheezes, rhonchi, or crackles CARDIOVASCULAR: Normal heart rate and rhythm, S1 and S2 normal without murmurs ABDOMEN: Soft, non-tender, non-distended, without organomegaly, Normal bowel sounds EXTREMITIES: No cyanosis or edema NEUROLOGICAL: Cranial nerves grossly intact, Moves all extremities without gross motor or  sensory deficit  Results for orders placed or performed in visit on 04/27/24  POCT urinalysis dipstick   Collection Time: 04/27/24  9:53 AM  Result Value Ref Range   Color, UA gold    Clarity, UA cloudy    Glucose, UA Positive (A) Negative   Bilirubin, UA negative    Ketones, UA negative    Spec Grav, UA 1.020 1.010 - 1.025   Blood, UA moderate    pH, UA 5.0 5.0 - 8.0   Protein, UA Positive (A) Negative   Urobilinogen, UA 0.2 0.2 or 1.0 E.U./dL   Nitrite, UA small    Leukocytes, UA Moderate (2+) (A) Negative   Appearance cloudy    Odor strong   Cervicovaginal ancillary only   Collection Time: 04/27/24 10:09 AM  Result Value Ref Range   Neisseria Gonorrhea Negative    Chlamydia Negative    Trichomonas Negative    Bacterial Vaginitis (gardnerella) Positive (A)    Candida Vaginitis Negative    Candida Glabrata Positive (A)    Comment Normal Reference Range Candida Species - Negative    Comment Normal Reference Range Candida Galbrata - Negative    Comment Normal Reference Range Trichomonas - Negative    Comment      Normal Reference Range Bacterial Vaginosis - Negative   Comment Normal Reference Ranger Chlamydia - Negative    Comment      Normal Reference Range Neisseria Gonorrhea - Negative  Urine Culture   Collection Time: 04/27/24 10:16 AM  Result Value Ref Range   MICRO NUMBER: [id number]    SPECIMEN QUALITY: Adequate    Sample Source URINE    STATUS: FINAL    ISOLATE 1: Escherichia coli (A)       Susceptibility   Escherichia coli - URINE CULTURE, REFLEX    AMOX/CLAVULANIC <=2 Sensitive     AMPICILLIN/SULBACTAM <=2 Sensitive     CEFAZOLIN * <=1 Not Reportable      * For infections other than uncomplicated UTI caused by E. coli, K. pneumoniae or P. mirabilis: Cefazolin  is resistant if MIC > or = 8 mcg/mL. (Distinguishing susceptible versus intermediate for isolates with MIC < or = 4 mcg/mL requires additional testing.) For uncomplicated UTI caused by E. coli, K.  pneumoniae or P. mirabilis: Cefazolin  is susceptible if MIC <32 mcg/mL and predicts susceptible to the oral agents cefaclor, cefdinir , cefpodoxime, cefprozil, cefuroxime, cephalexin  and loracarbef.     CEFTAZIDIME <=0.5 Sensitive     CEFEPIME <=0.12 Sensitive     CEFTRIAXONE <=0.25 Sensitive     CIPROFLOXACIN  <=0.06 Sensitive     LEVOFLOXACIN <=0.12 Sensitive     GENTAMICIN <=  1 Sensitive     IMIPENEM <=0.25 Sensitive     MEROPENEM <=0.25 Sensitive     NITROFURANTOIN  <=16 Sensitive     PIP/TAZO <=4 Sensitive     TRIMETH /SULFA * <=20 Sensitive      * For infections other than uncomplicated UTI caused by E. coli, K. pneumoniae or P. mirabilis: Cefazolin  is resistant if MIC > or = 8 mcg/mL. (Distinguishing susceptible versus intermediate for isolates with MIC < or = 4 mcg/mL requires additional testing.) For uncomplicated UTI caused by E. coli, K. pneumoniae or P. mirabilis: Cefazolin  is susceptible if MIC <32 mcg/mL and predicts susceptible to the oral agents cefaclor, cefdinir , cefpodoxime, cefprozil, cefuroxime, cephalexin  and loracarbef. Legend: S = Susceptible  I = Intermediate R = Resistant  NS = Not susceptible SDD = Susceptible Dose Dependent * = Not Tested  NR = Not Reported **NN = See Therapy Comments   RPR   Collection Time: 04/27/24 10:16 AM  Result Value Ref Range   RPR Ser Ql NON-REACTIVE NON-REACTIVE  HIV antibody (with reflex)   Collection Time: 04/27/24 10:16 AM  Result Value Ref Range   HIV FINAL INTERPRETATION HIV NEGATIVE    HIV 1&2 Ab, 4th Generation NON-REACTIVE NON-REACTIVE          Assessment & Plan:   Problem List Items Addressed This Visit       Other   Anxiety   Relevant Medications   cariprazine (VRAYLAR) 1.5 MG capsule   Major depression, recurrent, chronic - Primary   Relevant Medications   cariprazine (VRAYLAR) 1.5 MG capsule     Assessment and Plan Assessment & Plan Major depressive disorder and anxiety  disorder Significant improvement in symptoms with current medication regimen. Previous PHQ-9 score was 11 and GAD score was 13. Current PHQ-9 score is 3 and GAD score is also 3, indicating substantial improvement. - Continue trazodone  100 mg at bedtime. - Continue Cymbalta  60 mg twice daily. - Continue Vraylar 1.5 mg daily. - Sent refill for Vraylar to [facility] pharmacy. - Will discuss Rybelsus  affordability and insurance coverage with Allyson.  Urinary tract infection Currently on antibiotics for urinary tract infection. - Continue current antibiotic regimen.    DM Discussed concern of not being able to afford rybelsus  with Allyson RPH.   per the pharmacy, her primary insurance covers it at $14.99 a month. Secondary insurance does not cover it, but if the $14.99 is feasible for her, we can plan to stay on it. Otherwise, we can talk about an injectable next month when she is seen by [facility].     Follow up plan: Return in about 3 months (around 08/01/2024) for follow up.

## 2024-05-05 ENCOUNTER — Ambulatory Visit
Admission: RE | Admit: 2024-05-05 | Discharge: 2024-05-05 | Disposition: A | Discharge status: Home / Self Care | Source: Ambulatory Visit | Attending: Nurse Practitioner | Admitting: Nurse Practitioner

## 2024-05-05 DIAGNOSIS — Z1231 Encounter for screening mammogram for malignant neoplasm of breast: Secondary | ICD-10-CM | POA: Insufficient documentation

## 2024-05-07 ENCOUNTER — Other Ambulatory Visit (HOSPITAL_COMMUNITY): Payer: Self-pay

## 2024-05-07 ENCOUNTER — Telehealth: Payer: Self-pay | Admitting: Pharmacy Technician

## 2024-05-07 ENCOUNTER — Other Ambulatory Visit: Payer: Self-pay

## 2024-05-07 NOTE — Telephone Encounter (Signed)
 Pharmacy Patient Advocate Encounter  Received notification from [facility] MEDICAID that Prior Authorization for Rybelsus  14MG  tablets has been DENIED.  Full denial letter will be uploaded to the media tab. See denial reason below.    PA #/Case ID/Reference #: [id number]

## 2024-05-07 NOTE — Telephone Encounter (Signed)
 Pharmacy Patient Advocate Encounter  Received notification from [facility] MEDICAID that Prior Authorization for Farxiga  10MG  tablets has been APPROVED from 05/07/24 to 05/07/25. Ran test claim, Copay is $4.00. This test claim was processed through [facility]- copay amounts may vary at other pharmacies due to pharmacy/plan contracts, or as the patient moves through the different stages of their insurance plan.   PA #/Case ID/Reference #: [id number]

## 2024-05-07 NOTE — Telephone Encounter (Signed)
 Pharmacy Patient Advocate Encounter   Received notification from CoverMyMeds that prior authorization for Farxiga  10MG  tablets is required/requested.   Insurance verification completed.   The patient is insured through [facility] MEDICAID.   Per test claim: PA required; PA started via CoverMyMeds. KEY [id number] . Waiting for clinical questions to populate.

## 2024-05-07 NOTE — Telephone Encounter (Signed)
 Pharmacy Patient Advocate Encounter   Received notification from CoverMyMeds that prior authorization for Rybelsus  14MG  tablets  is required/requested.   Insurance verification completed.   The patient is insured through [facility] MEDICAID.   Per test claim: PA required; PA submitted to above mentioned insurance via Latent Key/confirmation #/EOC [id number] Status is pending

## 2024-05-08 ENCOUNTER — Ambulatory Visit: Attending: Cardiology | Admitting: Cardiology

## 2024-05-08 ENCOUNTER — Encounter: Payer: Self-pay | Admitting: Cardiology

## 2024-05-08 ENCOUNTER — Other Ambulatory Visit: Payer: Self-pay

## 2024-05-08 VITALS — BP 132/74 | HR 87 | Ht 64.0 in | Wt 170.2 lb

## 2024-05-08 DIAGNOSIS — F17200 Nicotine dependence, unspecified, uncomplicated: Secondary | ICD-10-CM

## 2024-05-08 DIAGNOSIS — E1169 Type 2 diabetes mellitus with other specified complication: Secondary | ICD-10-CM

## 2024-05-08 DIAGNOSIS — Z951 Presence of aortocoronary bypass graft: Secondary | ICD-10-CM

## 2024-05-08 DIAGNOSIS — I2510 Atherosclerotic heart disease of native coronary artery without angina pectoris: Secondary | ICD-10-CM

## 2024-05-08 DIAGNOSIS — I10 Essential (primary) hypertension: Secondary | ICD-10-CM | POA: Diagnosis not present

## 2024-05-08 DIAGNOSIS — J449 Chronic obstructive pulmonary disease, unspecified: Secondary | ICD-10-CM

## 2024-05-08 DIAGNOSIS — Z79899 Other long term (current) drug therapy: Secondary | ICD-10-CM

## 2024-05-08 DIAGNOSIS — E785 Hyperlipidemia, unspecified: Secondary | ICD-10-CM

## 2024-05-08 MED ORDER — NICOTINE 14 MG/24HR TD PT24
14.0000 mg | MEDICATED_PATCH | Freq: Every day | TRANSDERMAL | 0 refills | Status: AC
  Filled 2024-05-08 – 2024-06-05 (×2): qty 14, 14d supply, fill #0

## 2024-05-08 MED ORDER — NICOTINE 7 MG/24HR TD PT24
7.0000 mg | MEDICATED_PATCH | Freq: Every day | TRANSDERMAL | 0 refills | Status: AC
  Filled 2024-05-08: qty 14, 14d supply, fill #0

## 2024-05-08 MED ORDER — NICOTINE 21 MG/24HR TD PT24
21.0000 mg | MEDICATED_PATCH | Freq: Every day | TRANSDERMAL | 0 refills | Status: AC
  Filled 2024-05-08 – 2024-05-19 (×2): qty 14, 14d supply, fill #0

## 2024-05-08 NOTE — Patient Instructions (Signed)
 Pharm D referral for medication management  Medication Instructions:  Your physician recommends the following medication changes.  START TAKING: Nicotine  patches - 21 mg x 14 days, 14 mg x 14 days, 7 mg x 14 days  *If you need a refill on your cardiac medications before your next appointment, please call your pharmacy*  Lab Work: No labs ordered today  If you have labs (blood work) drawn today and your tests are completely normal, you will receive your results only by: MyChart Message (if you have MyChart) OR A paper copy in the mail If you have any lab test that is abnormal or we need to change your treatment, we will call you to review the results.  Testing/Procedures: No test ordered today   Follow-Up: At [facility], you and your health needs are our priority.  As part of our continuing mission to provide you with exceptional heart care, our providers are all part of one team.  This team includes your primary Cardiologist (physician) and Advanced Practice Providers or APPs (Physician Assistants and Nurse Practitioners) who all work together to provide you with the care you need, when you need it.  Your next appointment:   6 month(s)  Provider:   You may see Alm Clay, MD or one of the following Advanced Practice Providers on your designated Care Team:   Tylene Lunch, NP

## 2024-05-08 NOTE — Progress Notes (Signed)
 Cardiology Office Note   Date:  05/08/2024  ID:  Lauren, [name] 1976-03-24, MRN [id number] PCP: Leavy Mole, PA-C (Inactive)  [facility] Providers Cardiologist:  Alm Clay, MD Cardiology APP:  Gerard Frederick, NP     History of Present Illness Lauren [name] is a 48 y.o. female with a past medical history of hypertension, hyperlipidemia, type 2 diabetes, coronary artery disease status post CABG x 3, COPD, morbid obesity, GERD, who is here today for follow-up on her coronary artery disease.   She was originally seen by her PCP March 2024 with complaints of chest pain and was referred to cardiology and saw Dr. Clay.  She noted gradual onset of chest pain described as left parasternal pressure radiating to both arms.  This was happening a few times a week initially with exertion but then progressed at rest as well.  She started noticing when she was walking her dog and thinks while she was watching TV.   She was admitted to [facility] 10/19/2022 and underwent CABG x 3 on 10/22/2022 with SVG-PDA, SVG-OM, LIMA-LAD as well as endoscopic harvest of the right greater saphenous vein.  She tolerated the procedure well was transferred to SICU in stable condition.  Be started on diuretics for volume overload remains in sinus rhythm.  Hospital course was uneventful and she was able to be discharged on 10/26/2022.   She had noted her appetite is less prominent probably related to being on Rybelsus .  Her pressures initially dropped low enough where they actually stopped her ACE inhibitor that had been at 20 mg and it was now just recently restarted to 2.5 mg dosing.  She had been delayed starting cardiac rehab until was cleared by Dr. Shyrl.  She actually just started rehab in early August.  She is definitely enjoying doing the rehab exercising as well as the education classes.  She walks at least 2 miles on the nonrehab days.  Denies any chest pain pressure or dyspnea at rest or  exertion.  She was not able to even do minor housework without having angina prior to her CABG and now is able to do vigorous activity without noting any problems.  No PND, orthopnea or edema.    She previously been seen in clinic 02/28/2023 by Dr. Clay.  She did well since her CABG.  She did discuss minor chest wall tenderness but was happy to be in cardiac rehab.  There were no medication changes that were made and no further testing that was ordered at that time.  She was then seen in clinic 07/08/2023 stating overall from a cardiac perspective she had been doing well.  She denies chest pain, shortness of breath or peripheral edema.  She had done well with cardiac rehab and continue to walk several miles each day.  Unfortunately she did continue to smoke.  She was also requiring medical clearance to have some dental work completed.  There were no medication changes that were made and no further testing that was ordered at that time.  She was last seen in clinic 10/31/2023 by Dr. Clay.  She was doing well with no major complaints.  She had continued to smoke but continued to decrease the amount that she was smoking per day working on his smoking cessation.  She was advised that it was okay to hold her aspirin  5 to 7 days preop for surgeries or procedures.  She had had significant weight loss on Rybelsus .  There were no further  medication changes that were made or testing that needed to be ordered.   She returns to clinic today stating that overall she has been doing well.  She stated that she has had 1 episode of what she was unable to determine with this chest pain or reflux.  She took 1 nitroglycerin  and it relieved the symptoms she has without any recurrent symptoms.  She denies any shortness of breath, peripheral edema, lightheadedness or dizziness or palpitations.  She states she has been compliant with her medication without any adverse side effects.  With insurance changes there is 7 of her  medications and she is going to be unable to afford.  She unfortunately has continued to smoke and is asking for nicotine  patches today.  She denies any hospitalizations or visits to the emergency department.  ROS: 10 point review of systems has been reviewed and considered negative the exception was been listed in the HPI  Studies Reviewed EKG Interpretation Date/Time:  Friday May 08 2024 15:19:57 EST Ventricular Rate:  87 PR Interval:  162 QRS Duration:  88 QT Interval:  380 QTC Calculation: 457 R Axis:   56  Text Interpretation: Normal sinus rhythm Nonspecific T wave abnormality When compared with ECG of 08-Jul-2023 14:52, No significant change was found Confirmed by Gerard Frederick ([id number]) on 05/08/2024 3:36:42 PM    RLE U/S 11/21/22 IMPRESSION: 1. No right lower extremity DVT. 2. Elongated complex fluid collection in the right calf likely postop seroma or hematoma.    TTE 10/19/22 1. Left ventricular ejection fraction, by estimation, is 55 to 60%. The  left ventricle has normal function. The left ventricle has no regional  wall motion abnormalities. Left ventricular diastolic parameters are  consistent with Grade I diastolic  dysfunction (impaired relaxation). The average left ventricular global  longitudinal strain is -15.6 %. The global longitudinal strain is  abnormal.   2. Right ventricular systolic function is normal. The right ventricular  size is normal.   3. The mitral valve is normal in structure. Trivial mitral valve  regurgitation. No evidence of mitral stenosis.   4. The aortic valve is tricuspid. Aortic valve regurgitation is not  visualized. No aortic stenosis is present.   5. The inferior vena cava is normal in size with greater than 50%  respiratory variability, suggesting right atrial pressure of 3 mmHg.     LHC 10/19/22   Mid LM to Ost LAD lesion is 95% stenosed with 95% stenosed side branch in Ost Cx.   Mid RCA lesion is 99% stenosed.   RPAV lesion is  90% stenosed.   The left ventricular systolic function is normal.   LV end diastolic pressure is normal.   The left ventricular ejection fraction is 50-55% by visual estimate.   POST-OPERATIVE DIAGNOSIS:   Low normal EF roughly 50 to 55%.  Difficult to assess wall motion due to ectopy.  Will check 2D echo. Severe multivessel CAD: Distal left main 90% heavily calcified eccentric. Mid LAD eccentric 30%, otherwise the LAD has several small diagonal branches and tapers into a relatively small caliber vessel that reaches the apex. The LCx courses essentially has a lateral OM branch with several small branches that bifurcates distally.  Inferior branch has a 95% stenosis Mid RCA focal eccentric 95% subtotal occlusion; RCA bifurcates into RPA V and PDA There is a 90% stenosis at the RPA V-RPL 1 bifurcation  PLAN OF CARE: Admit to inpatient  -> will initiate low-dose IV nitroglycerin  infusion as well as start  IV heparin  2 hours after TR band removal based on distal left main disease.  CVTS consulted-will hope for urgent CABG -> will help for Monday, but potentially Saturday or Sunday if she destabilizes. Will titrate GDMT over the weekend. Sliding scale insulin  Smoking cessation counseling  Risk Assessment/Calculations           Physical Exam VS:  BP 132/74 (BP Location: Left Arm, Patient Position: Sitting, Cuff Size: Normal)   Pulse 87   Ht 5' 4 (1.626 m)   Wt 170 lb 3.2 oz (77.2 kg)   SpO2 97%   BMI 29.21 kg/m        Wt Readings from Last 3 Encounters:  05/08/24 170 lb 3.2 oz (77.2 kg)  05/01/24 170 lb (77.1 kg)  04/27/24 172 lb 4.8 oz (78.2 kg)    GEN: Well nourished, well developed in no acute distress NECK: No JVD; No carotid bruits CARDIAC: RRR, no murmurs, rubs, gallops RESPIRATORY:  Clear to auscultation without rales, wheezing or rhonchi  ABDOMEN: Soft, non-tender, non-distended EXTREMITIES:  No edema; No deformity   ASSESSMENT AND PLAN Coronary artery disease status  post CABG x 3 on 10/26/2022 with LIMA-LAD, saphenous-PDA, saphenous-OM.  She denies any current chest discomfort today.  She stated that she has taken 1 dose of Nitrostat  since the last time that she was evaluated in clinic in May.  EKG today reveals sinus rhythm with a rate of 82 with nonspecific T waves that is unchanged from prior studies.  She did well in cardiac rehab and is continued to be active.  She is continued on aspirin  81 mg daily and rosuvastatin  40 mg daily she was not previously on next Lizette but states that due to cost she will be unable to continue.  No further ischemic evaluation needed at this time.  Hypertension with a blood pressure today of 132/74.  Blood pressure continues to remain stable.  She is continued on lisinopril  2.5 mg daily and carvedilol  6.25 mg twice daily.  She has been encouraged to continue to monitor pressures 1 to 2 hours postmedication administration as well.  Hyperlipidemia with associated type 2 diabetes with most recent LDL of 73.  Goal is less than 70 and ideally 55.  She is continued on rosuvastatin  40 mg daily.  Last A1c of 9.9.  She is continued on Farxiga  along with Rybelsus  for diabetes management with ongoing management per PCP.  Due to the issue of cost that she was previously on Nexlizet  and is unable to afford at this time we are placing a referral to Pharm.D. for assistance with medication management.  COPD with chronic and occasional morning cough likely related to her smoking.  Tobacco abuse continues to smoke daily with total cessation being recommended.  She is requesting nicotine  patches today sent to the pharmacy of her choice.       Dispo: Patient to return to clinic see MD/APP in 6 months or sooner if needed for further evaluation  Signed, Lennard Capek, NP

## 2024-05-18 ENCOUNTER — Other Ambulatory Visit: Payer: Self-pay

## 2024-05-19 ENCOUNTER — Other Ambulatory Visit: Payer: Self-pay

## 2024-05-20 ENCOUNTER — Other Ambulatory Visit: Payer: Self-pay

## 2024-05-29 ENCOUNTER — Encounter: Payer: Self-pay | Admitting: Nurse Practitioner

## 2024-05-29 ENCOUNTER — Other Ambulatory Visit: Payer: Self-pay

## 2024-05-29 ENCOUNTER — Ambulatory Visit (INDEPENDENT_AMBULATORY_CARE_PROVIDER_SITE_OTHER): Admitting: Nurse Practitioner

## 2024-05-29 VITALS — BP 132/88 | HR 87 | Temp 98.0°F | Ht 64.0 in | Wt 172.0 lb

## 2024-05-29 DIAGNOSIS — H66003 Acute suppurative otitis media without spontaneous rupture of ear drum, bilateral: Secondary | ICD-10-CM

## 2024-05-29 DIAGNOSIS — J0140 Acute pansinusitis, unspecified: Secondary | ICD-10-CM

## 2024-05-29 DIAGNOSIS — J449 Chronic obstructive pulmonary disease, unspecified: Secondary | ICD-10-CM | POA: Diagnosis not present

## 2024-05-29 DIAGNOSIS — J069 Acute upper respiratory infection, unspecified: Secondary | ICD-10-CM

## 2024-05-29 MED ORDER — BENZONATATE 100 MG PO CAPS
200.0000 mg | ORAL_CAPSULE | Freq: Two times a day (BID) | ORAL | 0 refills | Status: DC | PRN
  Filled 2024-05-29: qty 20, 5d supply, fill #0

## 2024-05-29 MED ORDER — DOXYCYCLINE HYCLATE 100 MG PO TABS
100.0000 mg | ORAL_TABLET | Freq: Two times a day (BID) | ORAL | 0 refills | Status: AC
  Filled 2024-05-29: qty 14, 7d supply, fill #0

## 2024-05-29 MED ORDER — PREDNISONE 10 MG PO TABS
ORAL_TABLET | ORAL | 0 refills | Status: AC
  Filled 2024-05-29: qty 21, 6d supply, fill #0

## 2024-05-29 MED ORDER — PROMETHAZINE-DM 6.25-15 MG/5ML PO SYRP
5.0000 mL | ORAL_SOLUTION | Freq: Four times a day (QID) | ORAL | 0 refills | Status: DC | PRN
  Filled 2024-05-29: qty 118, 6d supply, fill #0

## 2024-05-29 NOTE — Progress Notes (Signed)
 BP 132/88   Pulse 87   Temp 98 F (36.7 C)   Ht 5' 4 (1.626 m)   Wt 172 lb (78 kg)   SpO2 92%   BMI 29.52 kg/m    Subjective:    Patient ID: Lauren [name] Feeling, female    DOB: 12-20-1975, 48 y.o.   MRN: [id number]  HPI: Lauren [name] is a 48 y.o. female  Chief Complaint  Patient presents with   Cough    Pt c/o cough, chills, bilateral otalgia x1 week.    Discussed the use of AI scribe software for clinical note transcription with the patient, who gave verbal consent to proceed.  History of Present Illness Lauren [name] is a 48 year old female with COPD who presents with upper respiratory symptoms.  Upper respiratory symptoms - Cough present for one week - Runny nose for one week - Scratchy throat for one week - Bilateral otalgia - Chest congestion sensation  Dyspnea - Mild shortness of breath accompanying upper respiratory symptoms  Chronic obstructive pulmonary disease (copd) management - COPD managed with albuterol  inhaler as needed - Has sufficient supply of albuterol  at home  Symptomatic treatment - Using over-the-counter Advil Cold and Flu and Robitussin with honey - No significant relief from these medications         05/01/2024    1:46 PM 04/02/2024    1:47 PM 12/31/2023   10:19 AM  Depression screen PHQ 2/9  Decreased Interest 1 1 0  Down, Depressed, Hopeless 0 2 0  PHQ - 2 Score 1 3 0  Altered sleeping 2 1 0  Tired, decreased energy 0 1 0  Change in appetite 0 2 0  Feeling bad or failure about yourself  0 1 0  Trouble concentrating 0 1 0  Moving slowly or fidgety/restless 0 1 0  Suicidal thoughts 0 1 0  PHQ-9 Score 3 11  0   Difficult doing work/chores Not difficult at all Somewhat difficult Not difficult at all     Data saved with a previous flowsheet row definition    Relevant past medical, surgical, family and social history reviewed and updated as indicated. Interim medical history since our last visit reviewed. Allergies and  medications reviewed and updated.  Review of Systems  Ten systems reviewed and is negative except as mentioned in HPI      Objective:      BP 132/88   Pulse 87   Temp 98 F (36.7 C)   Ht 5' 4 (1.626 m)   Wt 172 lb (78 kg)   SpO2 92%   BMI 29.52 kg/m    Wt Readings from Last 3 Encounters:  05/29/24 172 lb (78 kg)  05/08/24 170 lb 3.2 oz (77.2 kg)  05/01/24 170 lb (77.1 kg)    Physical Exam GENERAL: Alert, cooperative, well developed, no acute distress. HEENT: Normocephalic, normal oropharynx, moist mucous membranes. Bilateral ear infections with redness. Sinus infection present. CHEST: Clear to auscultation bilaterally. Wheezing present. CARDIOVASCULAR: Normal heart rate and rhythm, S1 and S2 normal without murmurs. ABDOMEN: Soft, non-tender, non-distended, without organomegaly. Normal bowel sounds. EXTREMITIES: No cyanosis or edema. NEUROLOGICAL: Cranial nerves grossly intact, moves all extremities without gross motor or sensory deficit.  Results for orders placed or performed in visit on 04/27/24  POCT urinalysis dipstick   Collection Time: 04/27/24  9:53 AM  Result Value Ref Range   Color, UA gold    Clarity, UA cloudy    Glucose, UA Positive (  A) Negative   Bilirubin, UA negative    Ketones, UA negative    Spec Grav, UA 1.020 1.010 - 1.025   Blood, UA moderate    pH, UA 5.0 5.0 - 8.0   Protein, UA Positive (A) Negative   Urobilinogen, UA 0.2 0.2 or 1.0 E.U./dL   Nitrite, UA small    Leukocytes, UA Moderate (2+) (A) Negative   Appearance cloudy    Odor strong   Cervicovaginal ancillary only   Collection Time: 04/27/24 10:09 AM  Result Value Ref Range   Neisseria Gonorrhea Negative    Chlamydia Negative    Trichomonas Negative    Bacterial Vaginitis (gardnerella) Positive (A)    Candida Vaginitis Negative    Candida Glabrata Positive (A)    Comment Normal Reference Range Candida Species - Negative    Comment Normal Reference Range Candida Galbrata -  Negative    Comment Normal Reference Range Trichomonas - Negative    Comment      Normal Reference Range Bacterial Vaginosis - Negative   Comment Normal Reference Ranger Chlamydia - Negative    Comment      Normal Reference Range Neisseria Gonorrhea - Negative  Urine Culture   Collection Time: 04/27/24 10:16 AM  Result Value Ref Range   MICRO NUMBER: [id number]    SPECIMEN QUALITY: Adequate    Sample Source URINE    STATUS: FINAL    ISOLATE 1: Escherichia coli (A)       Susceptibility   Escherichia coli - URINE CULTURE, REFLEX    AMOX/CLAVULANIC <=2 Sensitive     AMPICILLIN/SULBACTAM <=2 Sensitive     CEFAZOLIN * <=1 Not Reportable      * For infections other than uncomplicated UTI caused by E. coli, K. pneumoniae or P. mirabilis: Cefazolin  is resistant if MIC > or = 8 mcg/mL. (Distinguishing susceptible versus intermediate for isolates with MIC < or = 4 mcg/mL requires additional testing.) For uncomplicated UTI caused by E. coli, K. pneumoniae or P. mirabilis: Cefazolin  is susceptible if MIC <32 mcg/mL and predicts susceptible to the oral agents cefaclor, cefdinir , cefpodoxime, cefprozil, cefuroxime, cephalexin  and loracarbef.     CEFTAZIDIME <=0.5 Sensitive     CEFEPIME <=0.12 Sensitive     CEFTRIAXONE <=0.25 Sensitive     CIPROFLOXACIN  <=0.06 Sensitive     LEVOFLOXACIN <=0.12 Sensitive     GENTAMICIN <=1 Sensitive     IMIPENEM <=0.25 Sensitive     MEROPENEM <=0.25 Sensitive     NITROFURANTOIN  <=16 Sensitive     PIP/TAZO <=4 Sensitive     TRIMETH /SULFA * <=20 Sensitive      * For infections other than uncomplicated UTI caused by E. coli, K. pneumoniae or P. mirabilis: Cefazolin  is resistant if MIC > or = 8 mcg/mL. (Distinguishing susceptible versus intermediate for isolates with MIC < or = 4 mcg/mL requires additional testing.) For uncomplicated UTI caused by E. coli, K. pneumoniae or P. mirabilis: Cefazolin  is susceptible if MIC <32 mcg/mL and predicts susceptible  to the oral agents cefaclor, cefdinir , cefpodoxime, cefprozil, cefuroxime, cephalexin  and loracarbef. Legend: S = Susceptible  I = Intermediate R = Resistant  NS = Not susceptible SDD = Susceptible Dose Dependent * = Not Tested  NR = Not Reported **NN = See Therapy Comments   RPR   Collection Time: 04/27/24 10:16 AM  Result Value Ref Range   RPR Ser Ql NON-REACTIVE NON-REACTIVE  HIV antibody (with reflex)   Collection Time: 04/27/24 10:16 AM  Result Value Ref Range  HIV FINAL INTERPRETATION HIV NEGATIVE    HIV 1&2 Ab, 4th Generation NON-REACTIVE NON-REACTIVE          Assessment & Plan:   Problem List Items Addressed This Visit       Respiratory   Chronic obstructive pulmonary disease, unspecified COPD type (HCC) (Chronic)   Relevant Medications   predniSONE  (DELTASONE ) 10 MG tablet   benzonatate  (TESSALON ) 100 MG capsule   doxycycline  (VIBRA -TABS) 100 MG tablet   promethazine -dextromethorphan (PROMETHAZINE -DM) 6.25-15 MG/5ML syrup   Other Visit Diagnoses       Viral upper respiratory tract infection    -  Primary   Relevant Medications   predniSONE  (DELTASONE ) 10 MG tablet   benzonatate  (TESSALON ) 100 MG capsule   doxycycline  (VIBRA -TABS) 100 MG tablet   promethazine -dextromethorphan (PROMETHAZINE -DM) 6.25-15 MG/5ML syrup     Non-recurrent acute suppurative otitis media of both ears without spontaneous rupture of tympanic membranes       Relevant Medications   predniSONE  (DELTASONE ) 10 MG tablet   benzonatate  (TESSALON ) 100 MG capsule   doxycycline  (VIBRA -TABS) 100 MG tablet   promethazine -dextromethorphan (PROMETHAZINE -DM) 6.25-15 MG/5ML syrup     Acute non-recurrent pansinusitis       Relevant Medications   predniSONE  (DELTASONE ) 10 MG tablet   benzonatate  (TESSALON ) 100 MG capsule   doxycycline  (VIBRA -TABS) 100 MG tablet   promethazine -dextromethorphan (PROMETHAZINE -DM) 6.25-15 MG/5ML syrup        Assessment and Plan Assessment & Plan Acute  pansinusitis and bilateral acute suppurative otitis media Symptoms include bilateral ear pain, cough, and runny nose for one week. Examination reveals bilateral ear infections and sinus infection. No tympanic membrane rupture. Doxycycline  chosen due to allergies to metformin  and cephalexin . - Prescribed doxycycline  to cover ear, sinus, and chest infections. - Advised to drink plenty of fluids to stay hydrated and thin mucus. - Instructed to start steroid tomorrow to avoid sleep disturbance. - Prescribed two cough medicines: one to reduce tickle and another syrup that may cause drowsiness, to be taken at bedtime if needed.  Acute upper respiratory infection Symptoms include cough, runny nose, and scratchy throat. Examination shows wheezing and sinus infection. Advised to maintain hydration to prevent dehydration and thin mucus. - Prescribed doxycycline  to cover upper respiratory infection. - Advised to drink plenty of fluids to stay hydrated and thin mucus. - Prescribed two cough medicines: one to reduce tickle and another syrup that may cause drowsiness, to be taken at bedtime if needed.  Chronic obstructive pulmonary disease COPD with current use of albuterol  inhaler as needed. Mild shortness of breath reported. Steroid prescribed to help with wheezing, with instructions to start the following day to avoid sleep disturbance. - Prescribed steroid to help with wheezing, to be started the following day to avoid sleep disturbance. - Ensure adequate supply of albuterol  inhaler at home.        Follow up plan: Return if symptoms worsen or fail to improve.

## 2024-06-01 ENCOUNTER — Other Ambulatory Visit: Payer: Self-pay

## 2024-06-01 ENCOUNTER — Other Ambulatory Visit (HOSPITAL_COMMUNITY): Payer: Self-pay

## 2024-06-01 ENCOUNTER — Other Ambulatory Visit

## 2024-06-01 DIAGNOSIS — E1169 Type 2 diabetes mellitus with other specified complication: Secondary | ICD-10-CM

## 2024-06-01 DIAGNOSIS — E1165 Type 2 diabetes mellitus with hyperglycemia: Secondary | ICD-10-CM

## 2024-06-01 DIAGNOSIS — Z7984 Long term (current) use of oral hypoglycemic drugs: Secondary | ICD-10-CM

## 2024-06-01 DIAGNOSIS — E785 Hyperlipidemia, unspecified: Secondary | ICD-10-CM

## 2024-06-01 MED ORDER — EZETIMIBE 10 MG PO TABS
10.0000 mg | ORAL_TABLET | Freq: Every day | ORAL | 1 refills | Status: AC
  Filled 2024-06-01: qty 90, 90d supply, fill #0

## 2024-06-01 MED ORDER — TRULICITY 1.5 MG/0.5ML ~~LOC~~ SOAJ
1.5000 mg | SUBCUTANEOUS | 0 refills | Status: DC
  Filled 2024-06-01 – 2024-06-05 (×3): qty 2, 28d supply, fill #0

## 2024-06-01 NOTE — Progress Notes (Signed)
 S:     Reason for visit: ?  Lauren [name] is a 48 y.o. female with a history of diabetes (type 2), who presents today for a follow up diabetes Telephone pharmacotherapy visit.? Pertinent PMH also includes GERD, COPD, HTN, CAD, HLD.  Care Team: Primary Care Provider: Gareth Mliss FALCON, FNP   Patient was seen on 05/29/24 for a viral URI. She was started on a steroid pack and doxycycline  therapy at that time.  Patient reports home BG readings have been a bit elevated since starting the steroid therapy, but were historically around 110-120 mg/dL fasting.   Current diabetes medications include:  Farxiga  10 mg daily, Rybelsus  14 mg daily  Previous diabetes medications include: metformin  (GI distress), Januvia , Ozempic  (nauseous) Current hypertension medications include: carvedilol  6.25 mg BID, lisinopril  2.5 mg daily Current hyperlipidemia medications include: rosuvastatin  40 mg daily  Patient reports adherence to taking all medications as prescribed.   Have you been experiencing any side effects to the medications prescribed? no Do you have any problems obtaining medications due to transportation or finances? yes Insurance coverage: Paramedic and secondary Medicaid  Patient denies hypoglycemic events.  Reported home fasting blood sugars: 110-120 mg/dL (prior to steroid use) Reported 2 hour post-meal/random blood sugars: 160 mg/dL  DM Prevention:  Statin: Taking; high intensity.?  ACE/ARB: yes; lisinopril  Last urinary albumin /creatinine ratio:  Lab Results  Component Value Date   MICRALBCREAT NOTE 09/30/2023   MICRALBCREAT 7 08/31/2022   MICRALBCREAT 4 05/19/2021   MICRALBCREAT 7 12/03/2017   MICRALBCREAT NEG 03/16/2016   Last eye exam:  Lab Results  Component Value Date   HMDIABEYEEXA Retinopathy (A) 05/25/2021   Lab Results  Component Value Date   HMDIABEYEEXA Retinopathy (A) 05/25/2021   Last foot exam: 09/30/2023 Tobacco Use:  Tobacco Use: High Risk  (05/29/2024)   Patient History    Smoking Tobacco Use: Some Days    Smokeless Tobacco Use: Never    Passive Exposure: Not on file   O:   Vitals:  Wt Readings from Last 3 Encounters:  05/29/24 172 lb (78 kg)  05/08/24 170 lb 3.2 oz (77.2 kg)  05/01/24 170 lb (77.1 kg)   BP Readings from Last 3 Encounters:  05/29/24 132/88  05/08/24 132/74  05/01/24 134/86   Pulse Readings from Last 3 Encounters:  05/29/24 87  05/08/24 87  05/01/24 93     Labs:?  Lab Results  Component Value Date   HGBA1C 9.9 (H) 04/02/2024   HGBA1C 7.3 (H) 09/30/2023   HGBA1C 7.2 (H) 06/17/2023   GLUCOSE 172 (H) 04/02/2024   MICRALBCREAT NOTE 09/30/2023   MICRALBCREAT 7 08/31/2022   MICRALBCREAT 4 05/19/2021   CREATININE 0.59 04/02/2024   CREATININE 0.57 12/31/2023   CREATININE 0.67 09/30/2023    Lab Results  Component Value Date   CHOL 140 04/02/2024   LDLCALC 73 04/02/2024   LDLCALC 77 10/31/2023   LDLCALC 77 06/17/2023   HDL 35 (L) 04/02/2024   TRIG 232 (H) 04/02/2024   TRIG 136 10/31/2023   TRIG 112 06/17/2023   ALT 38 (H) 04/02/2024   ALT 46 (H) 12/31/2023   AST 29 04/02/2024   AST 44 (H) 12/31/2023      Chemistry      Component Value Date/Time   NA 139 04/02/2024 1408   NA 137 10/15/2022 1526   NA 138 05/25/2014 0926   K 4.2 04/02/2024 1408   K 4.1 05/25/2014 0926   CL 103 04/02/2024 1408   CL  107 05/25/2014 0926   CO2 25 04/02/2024 1408   CO2 25 05/25/2014 0926   BUN 9 04/02/2024 1408   BUN 12 10/15/2022 1526   BUN 11 05/25/2014 0926   CREATININE 0.59 04/02/2024 1408   GLU 129 07/01/2014 0000      Component Value Date/Time   CALCIUM  9.4 04/02/2024 1408   CALCIUM  8.6 05/25/2014 0926   ALKPHOS 69 10/31/2023 0848   ALKPHOS 68 05/25/2014 0926   AST 29 04/02/2024 1408   AST 28 05/25/2014 0926   ALT 38 (H) 04/02/2024 1408   ALT 40 05/25/2014 0926   BILITOT 0.5 04/02/2024 1408   BILITOT 0.4 10/31/2023 0848   BILITOT 0.3 05/25/2014 0926       The 10-year ASCVD  risk score (Arnett DK, et al., 2019) is: 9.4%  Lab Results  Component Value Date   MICRALBCREAT NOTE 09/30/2023   MICRALBCREAT 7 08/31/2022   MICRALBCREAT 4 05/19/2021   MICRALBCREAT 7 12/03/2017   MICRALBCREAT NEG 03/16/2016    A/P: Diabetes currently uncontrolled with a most recent A1c of 9.9% on 04/02/24. Home BG readings are at goal with a fasting range of 110-120 mg/dL. Patient is able to verbalize appropriate hypoglycemia management plan. Rybelsus  approval on [place] [employer] requires trial and failure of 2 GLP1 agents first. Discussed continuation of Rybelsus  with a cost of $14.99 on primary insurance or switching to an alternate agent. Patient opted to switch to an alternate agent. Will switch to an equivalent dose given hx of nausea with Ozempic .  -Switched GLP-1 from Rybelsus  to Trulicity  (dulaglutide ) 1.5 mg weekly.  -Continued SGLT2-I Farxiga  (dapagliflozin )10 mg daily.  -Patient educated on purpose, proper use, and potential adverse effects of Trulicity .  -Extensively discussed pathophysiology of diabetes, recommended lifestyle interventions, dietary effects on blood sugar control.  -Counseled on s/sx of and management of hypoglycemia.  -Next A1c anticipated 06/2024.   ASCVD risk - secondary prevention in patient with diabetes. Last LDL is 73 mg/dL, not at goal of <44 mg/dL. Nezlizet not preferred on insurance. Will start ezetimibe . May discuss possible PSCK9i at follow up.  -Continued rosuvastatin  40 mg daily.  -Started ezetimibe  10 mg daily  Patient verbalized understanding of treatment plan. Total time patient counseling 30 minutes.  Follow-up:  Pharmacist on 06/29/24  Peyton CHARLENA Ferries, PharmD, CPP Clinical Pharmacist [facility] [phone]

## 2024-06-02 ENCOUNTER — Other Ambulatory Visit (HOSPITAL_COMMUNITY): Payer: Self-pay

## 2024-06-03 ENCOUNTER — Ambulatory Visit: Payer: Self-pay

## 2024-06-03 ENCOUNTER — Other Ambulatory Visit: Payer: Self-pay | Admitting: Nurse Practitioner

## 2024-06-03 DIAGNOSIS — R053 Chronic cough: Secondary | ICD-10-CM

## 2024-06-03 DIAGNOSIS — J449 Chronic obstructive pulmonary disease, unspecified: Secondary | ICD-10-CM

## 2024-06-03 NOTE — Telephone Encounter (Signed)
 Cough, runny nose, ears popping PCP office visit 05/29/2024 doxycycline  (VIBRA -TABS) 100 MG tablet  benzonatate  (TESSALON ) 100 MG capsule  predniSONE  (DELTASONE ) 10 MG tablet  promethazine -dextromethorphan (PROMETHAZINE -DM) 6.25-15 MG/5ML syrup  Patient started the Prednisone  on Saturday 12/13 Patient states not any better--wants her PCP's advice on being seen again or trying something different medication wise--states her cough is still continuous  FYI Only or Action Required?: Action required by provider: clinical question for provider and update on patient condition.  Patient was last seen in primary care on 05/29/2024 by Gareth Mliss FALCON, FNP.  Called Nurse Triage reporting Cough.  Symptoms began prior to last visit 05/29/2024.  Interventions attempted: Prescription medications: doxycycline , prednisone , promethazine -dm, benzonatate   and Rest, hydration, or home remedies.  Symptoms are: not getting any better and getting worse per patient.  Triage Disposition: See Physician Within 24 Hours  Patient/caregiver understands and will follow disposition?: No, wishes to speak with PCP            Copied from CRM #[id number]. Topic: Clinical - Red Word Triage >> Jun 03, 2024 10:49 AM Tiffini S wrote: [facility] that prompted transfer to Nurse Triage: Patient was seen by pcp for viral upper respiratory tract infection- antibiotic prescribed is not helping and symptoms are worsening for runny nose, cough, hoarseness, and scratchy throat Reason for Disposition  [1] Continuous (nonstop) coughing interferes with work or school AND [2] no improvement using cough treatment per Care Advice  Answer Assessment - Initial Assessment Questions Cough, runny nose, ears popping PCP office visit 05/29/2024 doxycycline  (VIBRA -TABS) 100 MG tablet  benzonatate  (TESSALON ) 100 MG capsule  predniSONE  (DELTASONE ) 10 MG tablet  promethazine -dextromethorphan (PROMETHAZINE -DM) 6.25-15 MG/5ML syrup   Patient started the Prednisone  on Saturday 12/13 Patient denies any chest pain, difficulty breathing, coughing up blood, known fevers  Patient is advised to call us  back if anything changes or with any further questions/concerns. Patient is advised that if anything worsens to go to the Emergency Room. Patient verbalized understanding.   3. SPUTUM: Describe the color of your sputum (e.g., none, dry cough; clear, white, yellow, green)     yellowish 4. HEMOPTYSIS: Are you coughing up any blood? If Yes, ask: How much? (e.g., flecks, streaks, tablespoons, etc.)     denies 5. DIFFICULTY BREATHING: Are you having difficulty breathing? If Yes, ask: How bad is it? (e.g., mild, moderate, severe)      denies 6. FEVER: Do you have a fever? If Yes, ask: What is your temperature, how was it measured, and when did it start?     denies 8. LUNG HISTORY: Do you have any history of lung disease?  (e.g., pulmonary embolus, asthma, emphysema)     COPD 9. PE RISK FACTORS: Do you have a history of blood clots? (or: recent major surgery, recent prolonged travel, bedridden)     No known blood clots 10. OTHER SYMPTOMS: Do you have any other symptoms? (e.g., runny nose, wheezing, chest pain)       ----- 11. PREGNANCY: Is there any chance you are pregnant? When was your last menstrual period?       No  Protocols used: Cough - Acute Productive-A-AH

## 2024-06-03 NOTE — Telephone Encounter (Signed)
 Pt.notified

## 2024-06-05 ENCOUNTER — Other Ambulatory Visit: Payer: Self-pay | Admitting: Cardiology

## 2024-06-05 ENCOUNTER — Other Ambulatory Visit: Payer: Self-pay

## 2024-06-26 ENCOUNTER — Other Ambulatory Visit: Payer: Self-pay

## 2024-06-29 ENCOUNTER — Ambulatory Visit

## 2024-07-06 ENCOUNTER — Ambulatory Visit (INDEPENDENT_AMBULATORY_CARE_PROVIDER_SITE_OTHER)

## 2024-07-06 ENCOUNTER — Other Ambulatory Visit: Payer: Self-pay

## 2024-07-06 VITALS — Wt 176.0 lb

## 2024-07-06 DIAGNOSIS — E1165 Type 2 diabetes mellitus with hyperglycemia: Secondary | ICD-10-CM

## 2024-07-06 DIAGNOSIS — Z7984 Long term (current) use of oral hypoglycemic drugs: Secondary | ICD-10-CM

## 2024-07-06 LAB — POCT GLYCOSYLATED HEMOGLOBIN (HGB A1C): Hemoglobin A1C: 8.4 % — AB (ref 4.0–5.6)

## 2024-07-06 MED ORDER — TRULICITY 0.75 MG/0.5ML ~~LOC~~ SOAJ
0.7500 mg | SUBCUTANEOUS | 1 refills | Status: AC
  Filled 2024-07-06 – 2024-07-08 (×2): qty 2, 28d supply, fill #0

## 2024-07-06 MED ORDER — LANCET DEVICE MISC
0 refills | Status: AC
  Filled 2024-07-06: qty 1, fill #0

## 2024-07-06 MED ORDER — LANCETS MISC
3 refills | Status: AC
  Filled 2024-07-06: qty 300, 90d supply, fill #0

## 2024-07-06 MED ORDER — BLOOD GLUCOSE TEST VI STRP
ORAL_STRIP | 4 refills | Status: AC
  Filled 2024-07-06: qty 300, 90d supply, fill #0

## 2024-07-06 MED ORDER — BLOOD GLUCOSE MONITOR SYSTEM W/DEVICE KIT
PACK | 0 refills | Status: AC
  Filled 2024-07-06: qty 1, 1d supply, fill #0

## 2024-07-06 NOTE — Progress Notes (Signed)
 "  S:     Reason for visit: ?  Lauren [name] is a 49 y.o. female with a history of diabetes (type 2), who presents today for a follow up diabetes Face to Face pharmacotherapy visit.? Pertinent PMH also includes GERD, COPD, HTN, CAD, HLD.  Care Team: Primary Care Provider: Gareth Mliss FALCON, FNP  At last visit with clinical pharmacist on 06/01/24, patient was switched from Rybelsus  to Trulicity , as insurance required trial and failure of 2 GLP1 agents prior to coverage of oral GLP therapy. Ezetimibe  was started at that time.   Today, she reports she no longer has [place] Medicaid and was unable to pick up Trulicity  from the pharmacy.   Current diabetes medications include:  Farxiga  10 mg daily, Trulicity  1.5 mg weekly Previous diabetes medications include: metformin  (GI distress), Januvia , Ozempic  (nauseous), Rybelsus  (cost) Current hypertension medications include: carvedilol  6.25 mg BID, lisinopril  2.5 mg daily Current hyperlipidemia medications include: rosuvastatin  40 mg daily, ezetimibe  10 mg daily  Patient reports adherence to taking all medications as prescribed.   Have you been experiencing any side effects to the medications prescribed? no Do you have any problems obtaining medications due to transportation or finances? yes Insurance coverage: [facility]  Patient denies hypoglycemic events.  Reported home fasting blood sugars: not checking - reports her BG meter has not been functioning properly  DM Prevention:  Statin: Taking; high intensity.?  ACE/ARB: yes; lisinopril  Last urinary albumin /creatinine ratio:  Lab Results  Component Value Date   MICRALBCREAT NOTE 09/30/2023   MICRALBCREAT 7 08/31/2022   MICRALBCREAT 4 05/19/2021   MICRALBCREAT 7 12/03/2017   MICRALBCREAT NEG 03/16/2016   Last eye exam:  Lab Results  Component Value Date   HMDIABEYEEXA Retinopathy (A) 05/25/2021   Lab Results  Component Value Date   HMDIABEYEEXA Retinopathy (A) 05/25/2021    Last foot exam: 09/30/2023 Tobacco Use:  Tobacco Use: High Risk (05/29/2024)   Patient History    Smoking Tobacco Use: Some Days    Smokeless Tobacco Use: Never    Passive Exposure: Not on file   O:   Vitals:  Wt Readings from Last 3 Encounters:  05/29/24 172 lb (78 kg)  05/08/24 170 lb 3.2 oz (77.2 kg)  05/01/24 170 lb (77.1 kg)   BP Readings from Last 3 Encounters:  05/29/24 132/88  05/08/24 132/74  05/01/24 134/86   Pulse Readings from Last 3 Encounters:  05/29/24 87  05/08/24 87  05/01/24 93     Labs:?  Lab Results  Component Value Date   HGBA1C 9.9 (H) 04/02/2024   HGBA1C 7.3 (H) 09/30/2023   HGBA1C 7.2 (H) 06/17/2023   GLUCOSE 172 (H) 04/02/2024   MICRALBCREAT NOTE 09/30/2023   MICRALBCREAT 7 08/31/2022   MICRALBCREAT 4 05/19/2021   CREATININE 0.59 04/02/2024   CREATININE 0.57 12/31/2023   CREATININE 0.67 09/30/2023    Lab Results  Component Value Date   CHOL 140 04/02/2024   LDLCALC 73 04/02/2024   LDLCALC 77 10/31/2023   LDLCALC 77 06/17/2023   HDL 35 (L) 04/02/2024   TRIG 232 (H) 04/02/2024   TRIG 136 10/31/2023   TRIG 112 06/17/2023   ALT 38 (H) 04/02/2024   ALT 46 (H) 12/31/2023   AST 29 04/02/2024   AST 44 (H) 12/31/2023      Chemistry      Component Value Date/Time   NA 139 04/02/2024 1408   NA 137 10/15/2022 1526   NA 138 05/25/2014 0926   K 4.2  04/02/2024 1408   K 4.1 05/25/2014 0926   CL 103 04/02/2024 1408   CL 107 05/25/2014 0926   CO2 25 04/02/2024 1408   CO2 25 05/25/2014 0926   BUN 9 04/02/2024 1408   BUN 12 10/15/2022 1526   BUN 11 05/25/2014 0926   CREATININE 0.59 04/02/2024 1408   GLU 129 07/01/2014 0000      Component Value Date/Time   CALCIUM  9.4 04/02/2024 1408   CALCIUM  8.6 05/25/2014 0926   ALKPHOS 69 10/31/2023 0848   ALKPHOS 68 05/25/2014 0926   AST 29 04/02/2024 1408   AST 28 05/25/2014 0926   ALT 38 (H) 04/02/2024 1408   ALT 40 05/25/2014 0926   BILITOT 0.5 04/02/2024 1408   BILITOT 0.4  10/31/2023 0848   BILITOT 0.3 05/25/2014 0926       The 10-year ASCVD risk score (Arnett DK, et al., 2019) is: 9.9%  Lab Results  Component Value Date   MICRALBCREAT NOTE 09/30/2023   MICRALBCREAT 7 08/31/2022   MICRALBCREAT 4 05/19/2021   MICRALBCREAT 7 12/03/2017   MICRALBCREAT NEG 03/16/2016    A/P: Diabetes currently uncontrolled with a most recent A1c of 8.4% on 07/06/24, which is improved from 9.9% on 04/02/24.  Patient is able to verbalize appropriate hypoglycemia management plan. PA required for all GLP1 agents. Will complete PA for Trulicity  and determine if financially feasible when able to see cost on insurance. Will restart at lowest dose as she has been off GLP therapy for about a month.  -Restarted GLP-1  Trulicity  (dulaglutide ) 0.75 mg weekly.  -Continued SGLT2-I Farxiga  (dapagliflozin )10 mg daily.  -Patient educated on purpose, proper use, and potential adverse effects of Trulicity .  -Extensively discussed pathophysiology of diabetes, recommended lifestyle interventions, dietary effects on blood sugar control.  -Counseled on s/sx of and management of hypoglycemia.  -Next A1c anticipated 09/2024 -Sent prescription for BG testing supplies to the pharmacy  ASCVD risk - secondary prevention in patient with diabetes. Last LDL is 73 mg/dL, not at goal of <44 mg/dL.  -Continued rosuvastatin  40 mg daily.  -Continued ezetimibe  10 mg daily  Patient verbalized understanding of treatment plan. Total time patient counseling 30 minutes.  Follow-up:  Pharmacist on 08/04/24 PCP on 08/04/24 Lipid clinic on 07/20/24  Peyton CHARLENA Ferries, PharmD, BCACP, CPP Clinical Pharmacist [facility] [phone]    "

## 2024-07-07 ENCOUNTER — Other Ambulatory Visit (HOSPITAL_COMMUNITY): Payer: Self-pay

## 2024-07-07 ENCOUNTER — Telehealth: Payer: Self-pay | Admitting: Pharmacy Technician

## 2024-07-07 NOTE — Telephone Encounter (Signed)
 Pharmacy Patient Advocate Encounter   Received notification from [employer] that prior authorization for Trulicity  0.75MG /0.5ML auto-injectors is required/requested.   Insurance verification completed.   The patient is insured through [facility].   Per test claim: PA required; PA submitted to above mentioned insurance via Latent Key/confirmation #/EOC [facility] Status is pending

## 2024-07-08 ENCOUNTER — Other Ambulatory Visit: Payer: Self-pay

## 2024-07-08 ENCOUNTER — Other Ambulatory Visit (HOSPITAL_COMMUNITY): Payer: Self-pay

## 2024-07-08 NOTE — Telephone Encounter (Signed)
 Pharmacy Patient Advocate Encounter  Received notification from [facility] that Prior Authorization for Trulicity  0.75MG /0.5ML auto-injectors has been APPROVED from 07/08/24 to 07/07/25. Ran test claim, Copay is $25.00. This test claim was processed through [facility]- copay amounts may vary at other pharmacies due to pharmacy/plan contracts, or as the patient moves through the different stages of their insurance plan.   PA #/Case ID/Reference #: [id number]

## 2024-07-13 ENCOUNTER — Other Ambulatory Visit (HOSPITAL_COMMUNITY): Payer: Self-pay

## 2024-07-13 ENCOUNTER — Other Ambulatory Visit: Payer: Self-pay

## 2024-07-20 ENCOUNTER — Other Ambulatory Visit: Payer: Self-pay

## 2024-07-20 ENCOUNTER — Ambulatory Visit: Admitting: Pharmacist

## 2024-07-20 DIAGNOSIS — E785 Hyperlipidemia, unspecified: Secondary | ICD-10-CM

## 2024-07-20 DIAGNOSIS — F17200 Nicotine dependence, unspecified, uncomplicated: Secondary | ICD-10-CM

## 2024-07-20 DIAGNOSIS — E1169 Type 2 diabetes mellitus with other specified complication: Secondary | ICD-10-CM

## 2024-07-20 NOTE — Assessment & Plan Note (Signed)
 Assessment: LDL-C is above goal of less than 70 preferably less than 55 Previously on LDL-C still above goal Had cost issues last year but has new insurance through work this year Discussed Repatha , reviewed injection technique and potential cost  Plan: Continue rosuvastatin  40 mg daily along with ezetimibe  10 mg daily Submit prior authorization for Repatha  Would repeat labs in 3 months, may potentially be able to stop zetia 

## 2024-07-20 NOTE — Assessment & Plan Note (Signed)
 Assessment: Patient previously smoking 1 pack or more per day Has cut back to 10 cigarettes/day Patient reports using nicotine  patches although states that she needs to go pick up the 7 mg patches-not currently wearing 1 Denies smoking with patch on-knows this will make her sick Reviewed nicotine  gum-recommended she pick some up for cravings

## 2024-07-21 ENCOUNTER — Telehealth: Payer: Self-pay | Admitting: Pharmacy Technician

## 2024-07-21 ENCOUNTER — Other Ambulatory Visit (HOSPITAL_COMMUNITY): Payer: Self-pay

## 2024-07-21 DIAGNOSIS — I2510 Atherosclerotic heart disease of native coronary artery without angina pectoris: Secondary | ICD-10-CM

## 2024-07-21 DIAGNOSIS — E1169 Type 2 diabetes mellitus with other specified complication: Secondary | ICD-10-CM

## 2024-07-21 NOTE — Telephone Encounter (Signed)
 Pharmacy Patient Advocate Encounter  Received notification from [facility] that Prior Authorization for Repatha   has been APPROVED from 07/21/24 to 07/21/25. Ran test claim, Copay is $45.00- one month. This test claim was processed through [facility]- copay amounts may vary at other pharmacies due to pharmacy/plan contracts, or as the patient moves through the different stages of their insurance plan.   PA #/Case ID/Reference #: [id number]

## 2024-07-21 NOTE — Telephone Encounter (Signed)
 Pharmacy Patient Advocate Encounter   Received notification from cc charts that prior authorization for repatha  is required/requested.   Insurance verification completed.   The patient is insured through [facility].   Per test claim: PA required; PA submitted to above mentioned insurance via Latent Key/confirmation #/[id number] Status is pending

## 2024-07-22 ENCOUNTER — Encounter: Payer: Self-pay | Admitting: Pharmacist

## 2024-07-22 ENCOUNTER — Other Ambulatory Visit: Payer: Self-pay

## 2024-07-22 MED ORDER — REPATHA SURECLICK 140 MG/ML ~~LOC~~ SOAJ
1.0000 mL | SUBCUTANEOUS | 11 refills | Status: AC
  Filled 2024-07-22: qty 2, 28d supply, fill #0

## 2024-07-22 NOTE — Addendum Note (Signed)
 Addended by: Gail Vendetti D on: 07/22/2024 03:49 PM   Modules accepted: Orders

## 2024-08-04 ENCOUNTER — Ambulatory Visit

## 2024-08-04 ENCOUNTER — Ambulatory Visit: Admitting: Nurse Practitioner

## 2024-11-04 ENCOUNTER — Ambulatory Visit: Admitting: Cardiology
